# Patient Record
Sex: Female | Born: 1945 | Race: White | Hispanic: No | Smoking: Current every day smoker
Health system: Southern US, Community
[De-identification: ages and names within clinical notes are randomized; demographics above are authoritative.]

## PROBLEM LIST (undated history)

## (undated) DIAGNOSIS — Z8774 Personal history of (corrected) congenital malformations of heart and circulatory system: Secondary | ICD-10-CM

## (undated) DIAGNOSIS — I2510 Atherosclerotic heart disease of native coronary artery without angina pectoris: Secondary | ICD-10-CM

## (undated) DIAGNOSIS — N183 Chronic kidney disease, stage 3 unspecified: Secondary | ICD-10-CM

## (undated) DIAGNOSIS — K449 Diaphragmatic hernia without obstruction or gangrene: Secondary | ICD-10-CM

## (undated) DIAGNOSIS — Z720 Tobacco use: Secondary | ICD-10-CM

## (undated) DIAGNOSIS — I214 Non-ST elevation (NSTEMI) myocardial infarction: Secondary | ICD-10-CM

## (undated) DIAGNOSIS — I480 Paroxysmal atrial fibrillation: Secondary | ICD-10-CM

## (undated) DIAGNOSIS — Z8679 Personal history of other diseases of the circulatory system: Secondary | ICD-10-CM

## (undated) DIAGNOSIS — I255 Ischemic cardiomyopathy: Secondary | ICD-10-CM

## (undated) DIAGNOSIS — E875 Hyperkalemia: Secondary | ICD-10-CM

## (undated) DIAGNOSIS — I10 Essential (primary) hypertension: Secondary | ICD-10-CM

## (undated) DIAGNOSIS — K922 Gastrointestinal hemorrhage, unspecified: Secondary | ICD-10-CM

## (undated) DIAGNOSIS — N261 Atrophy of kidney (terminal): Secondary | ICD-10-CM

## (undated) DIAGNOSIS — K219 Gastro-esophageal reflux disease without esophagitis: Secondary | ICD-10-CM

## (undated) DIAGNOSIS — K2270 Barrett's esophagus without dysplasia: Secondary | ICD-10-CM

## (undated) DIAGNOSIS — K5790 Diverticulosis of intestine, part unspecified, without perforation or abscess without bleeding: Secondary | ICD-10-CM

## (undated) DIAGNOSIS — F70 Mild intellectual disabilities: Secondary | ICD-10-CM

## (undated) DIAGNOSIS — D649 Anemia, unspecified: Secondary | ICD-10-CM

## (undated) DIAGNOSIS — J9610 Chronic respiratory failure, unspecified whether with hypoxia or hypercapnia: Secondary | ICD-10-CM

## (undated) DIAGNOSIS — M1990 Unspecified osteoarthritis, unspecified site: Secondary | ICD-10-CM

## (undated) DIAGNOSIS — I5022 Chronic systolic (congestive) heart failure: Secondary | ICD-10-CM

## (undated) DIAGNOSIS — J449 Chronic obstructive pulmonary disease, unspecified: Secondary | ICD-10-CM

## (undated) DIAGNOSIS — W5311XA Bitten by rat, initial encounter: Secondary | ICD-10-CM

## (undated) HISTORY — PX: ASD REPAIR: SHX258

## (undated) HISTORY — PX: TOTAL ABDOMINAL HYSTERECTOMY: SHX209

## (undated) HISTORY — DX: Non-ST elevation (NSTEMI) myocardial infarction: I21.4

## (undated) HISTORY — PX: TONSILLECTOMY: SUR1361

## (undated) HISTORY — PX: TOTAL HIP ARTHROPLASTY: SHX124

## (undated) SURGERY — Surgical Case
Anesthesia: *Unknown

---

## 2002-10-05 ENCOUNTER — Emergency Department (HOSPITAL_COMMUNITY): Admission: EM | Admit: 2002-10-05 | Discharge: 2002-10-05 | Payer: Self-pay | Admitting: Emergency Medicine

## 2002-10-05 ENCOUNTER — Encounter: Payer: Self-pay | Admitting: Emergency Medicine

## 2006-03-17 ENCOUNTER — Emergency Department (HOSPITAL_COMMUNITY): Admission: EM | Admit: 2006-03-17 | Discharge: 2006-03-17 | Payer: Self-pay | Admitting: Emergency Medicine

## 2006-03-21 ENCOUNTER — Ambulatory Visit (HOSPITAL_COMMUNITY): Admission: RE | Admit: 2006-03-21 | Discharge: 2006-03-21 | Payer: Self-pay | Admitting: Pulmonary Disease

## 2006-03-25 ENCOUNTER — Ambulatory Visit (HOSPITAL_COMMUNITY): Admission: RE | Admit: 2006-03-25 | Discharge: 2006-03-25 | Payer: Self-pay | Admitting: Pulmonary Disease

## 2006-04-08 ENCOUNTER — Ambulatory Visit (HOSPITAL_COMMUNITY): Admission: RE | Admit: 2006-04-08 | Discharge: 2006-04-08 | Payer: Self-pay | Admitting: Pulmonary Disease

## 2006-06-28 ENCOUNTER — Ambulatory Visit (HOSPITAL_COMMUNITY): Admission: RE | Admit: 2006-06-28 | Discharge: 2006-06-28 | Payer: Self-pay | Admitting: Pulmonary Disease

## 2006-08-29 ENCOUNTER — Ambulatory Visit: Payer: Self-pay | Admitting: Orthopedic Surgery

## 2006-09-02 ENCOUNTER — Ambulatory Visit (HOSPITAL_COMMUNITY): Admission: RE | Admit: 2006-09-02 | Discharge: 2006-09-02 | Payer: Self-pay | Admitting: *Deleted

## 2006-09-15 ENCOUNTER — Ambulatory Visit: Payer: Self-pay | Admitting: Orthopedic Surgery

## 2006-09-30 ENCOUNTER — Ambulatory Visit (HOSPITAL_COMMUNITY): Admission: RE | Admit: 2006-09-30 | Discharge: 2006-09-30 | Payer: Self-pay | Admitting: Urology

## 2006-10-27 ENCOUNTER — Ambulatory Visit (HOSPITAL_COMMUNITY): Admission: RE | Admit: 2006-10-27 | Discharge: 2006-10-27 | Payer: Self-pay | Admitting: Urology

## 2006-11-17 ENCOUNTER — Ambulatory Visit: Payer: Self-pay | Admitting: Orthopedic Surgery

## 2006-11-28 ENCOUNTER — Ambulatory Visit (HOSPITAL_COMMUNITY): Admission: RE | Admit: 2006-11-28 | Discharge: 2006-11-28 | Payer: Self-pay | Admitting: Pulmonary Disease

## 2006-12-05 ENCOUNTER — Ambulatory Visit: Payer: Self-pay | Admitting: Orthopedic Surgery

## 2006-12-13 ENCOUNTER — Ambulatory Visit: Payer: Self-pay | Admitting: Orthopedic Surgery

## 2006-12-13 ENCOUNTER — Inpatient Hospital Stay (HOSPITAL_COMMUNITY): Admission: RE | Admit: 2006-12-13 | Discharge: 2006-12-16 | Payer: Self-pay | Admitting: Orthopedic Surgery

## 2006-12-13 ENCOUNTER — Ambulatory Visit: Payer: Self-pay | Admitting: Cardiovascular Disease

## 2006-12-13 ENCOUNTER — Encounter: Payer: Self-pay | Admitting: Orthopedic Surgery

## 2007-01-24 ENCOUNTER — Ambulatory Visit: Payer: Self-pay | Admitting: Orthopedic Surgery

## 2007-01-24 DIAGNOSIS — Z96649 Presence of unspecified artificial hip joint: Secondary | ICD-10-CM | POA: Insufficient documentation

## 2007-03-06 ENCOUNTER — Ambulatory Visit: Payer: Self-pay | Admitting: Orthopedic Surgery

## 2007-03-06 ENCOUNTER — Encounter (INDEPENDENT_AMBULATORY_CARE_PROVIDER_SITE_OTHER): Payer: Self-pay | Admitting: Radiology

## 2007-03-06 DIAGNOSIS — M169 Osteoarthritis of hip, unspecified: Secondary | ICD-10-CM

## 2007-03-06 DIAGNOSIS — M1610 Unilateral primary osteoarthritis, unspecified hip: Secondary | ICD-10-CM | POA: Insufficient documentation

## 2007-06-08 ENCOUNTER — Ambulatory Visit: Payer: Self-pay | Admitting: Orthopedic Surgery

## 2007-11-15 ENCOUNTER — Ambulatory Visit: Payer: Self-pay | Admitting: Orthopedic Surgery

## 2007-11-15 ENCOUNTER — Ambulatory Visit (HOSPITAL_COMMUNITY): Admission: RE | Admit: 2007-11-15 | Discharge: 2007-11-15 | Payer: Self-pay | Admitting: Orthopedic Surgery

## 2007-11-15 DIAGNOSIS — M545 Low back pain: Secondary | ICD-10-CM

## 2008-12-02 ENCOUNTER — Ambulatory Visit: Payer: Self-pay | Admitting: Orthopedic Surgery

## 2009-07-28 ENCOUNTER — Ambulatory Visit: Payer: Self-pay | Admitting: Orthopedic Surgery

## 2009-07-28 DIAGNOSIS — IMO0002 Reserved for concepts with insufficient information to code with codable children: Secondary | ICD-10-CM | POA: Insufficient documentation

## 2009-07-28 DIAGNOSIS — M1710 Unilateral primary osteoarthritis, unspecified knee: Secondary | ICD-10-CM

## 2009-07-30 ENCOUNTER — Encounter: Payer: Self-pay | Admitting: Orthopedic Surgery

## 2009-08-02 ENCOUNTER — Encounter: Payer: Self-pay | Admitting: Orthopedic Surgery

## 2010-06-07 ENCOUNTER — Encounter: Payer: Self-pay | Admitting: Pulmonary Disease

## 2010-06-16 NOTE — Letter (Signed)
Summary: *Orthopedic Consult Note  [facility]  [address]  [place], [place] [address]   Phone: [phone]  Fax: [phone]    Re:    Alexis Dillon DOB:    [birth year]   Dear: Renae Fickle   Thank you for requesting that we see the above patient for consultation.  A copy of the detailed office note will be sent under separate cover, for your review.  Evaluation today is consistent with: osteoarthritis RIGHT knee.  I offered the patient injection she did walk one.  She probably needs knee replacement surgery but I advised her to stop smoking and get and a patch if she needs it   Thank you for this opportunity to look after your patient.  Sincerely,   Terrance Mass. MD.

## 2010-06-16 NOTE — Letter (Signed)
Summary: History form  History form   Imported By: Jacklynn Ganong 08/06/2009 11:52:31  _____________________________________________________________________  External Attachment:    Type:   Image     Comment:   External Document

## 2010-06-16 NOTE — Assessment & Plan Note (Signed)
Summary: NEW PROBLEM/RT KNEE PAIN/NEEDS XR/REF HAWKINS/MEDICARE,MEDICA...   Visit Type:  new problem Referring Provider:  Dr. Kari Baars  CC:  right knee pain.  History of Present Illness: 65 years female chief complaint RIGHT knee pain she complains of sharp dull throbbing stabbing burning severe pain which comes and goes but mainly she feels all the time that she is hurting.  She gets relief with Advil and heating pad her pain is worse with cold and then whether.  She has associated symptoms of locking catching swelling tingling.  Xrays today in our office.    Allergies: No Known Drug Allergies  Past History:  Past Surgical History: Last updated: 12/16/2006 neck surgery heart surgery hole in heart hysterectomy Total Hip Arthroplasty right  Past Medical History: High Blood Pressure Back Feet Knee shoulder  Review of Systems Constitutional:  Complains of fever, chills, and fatigue; denies weight loss and weight gain. Cardiovascular:  Complains of palpitations; denies chest pain, fainting, and murmurs. Respiratory:  Complains of short of breath, wheezing, couch, and tightness; denies pain on inspiration and snoring . Gastrointestinal:  Complains of heartburn and nausea; denies vomiting, diarrhea, constipation, and blood in your stools. Neurologic:  Complains of tingling, unsteady gait, and dizziness; denies numbness, tremors, and seizure. Musculoskeletal:  Complains of joint pain, swelling, instability, stiffness, and muscle pain; denies redness and heat. Psychiatric:  Complains of depression and anxiety; denies nervousness and hallucinations. Skin:  Complains of poor healing; denies changes in the skin, rash, itching, and redness. HEENT:  Complains of blurred or double vision, redness, and watering; denies eye pain; headache, ears ringing. Immunology:  Complains of seasonal allergies; denies sinus problems and allergic to bee stings. Hemoatologic:  Complains of easy  bleeding and brusing.  The review of systems is negative for Genitourinary and Endocrine.  Physical Exam  Additional Exam:  GEN: appearance was normal   CDV: normal pulses temperature and no edema  LYMPH nodes were normal   SKIN was normal   Neuro: normal sensation Psyche: AAO x 3 and mood was normal   MSK *Gait was abnormal she walks supported by a cane she has a slumped posture  Inspection RIGHT knee she is tender medial joint line and lateral joint line.  Her range of motion is 115 /120.   She has good motor strength no instability.  She has valgus deformity of the knee.      Impression & Recommendations:  Problem # 1:  DEGENERATIVE JOINT DISEASE, RIGHT KNEE (ICD-715.96) Assessment New  plain films were obtained 3 views of the RIGHT knee they were done in my office  Findings there is valgus alignment to the knee.  There is diffuse osteopenia.  There is joint space narrowing laterally with surrounding osteophytes  Impression valgus osteoarthritis RIGHT knee with osteopenia  The patient an injection she didn't want the injection.  She probably needs to have a knee replacement.  She has significant COPD disease.  She should stop smoking and then she can have knee replacement surgery.  She also has osteoarthritis RIGHT hip.  Orders: Consultation Level III (16109) Knee x-ray,  3 views (60454)  Patient Instructions: 1)  See DR. Hawkins: 2)  about Patches for smoking  3)  return after stops smoking

## 2010-09-29 NOTE — H&P (Signed)
NAMEBREENA, BEVACQUA NO.:  [id number]   MEDICAL RECORD NO.:  [id number]          PATIENT TYPE:  AMB   LOCATION:  DAY                           FACILITY:  [facility]   PHYSICIAN:  Vickki Hearing, M.D.DATE OF BIRTH:  1945/12/19   DATE OF ADMISSION:  DATE OF DISCHARGE:  LH                              HISTORY & PHYSICAL   CHIEF COMPLAINT:  Right hip pain.   HISTORY:  This is a 65 year old female who has advanced osteoarthritis  of the right hip.  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  She presented with right groin  pain, some leg pain.  X-rays show a protrusio acetabula. She was taking  Percocet at the time for the relief and her pain at that time was  severe. She has trouble with simple activities of daily living such as  getting dressed, walking, getting of a chair and presents for total hip  replacement.   REVIEW OF SYSTEMS:  She has hypertension, nonsurgical chronic pain,  lightheadedness when she takes narcotic pain medicines, cough, shortness  of breath, headache, weakness, unsteady gait, sinusitis, hoarseness,  seasonal allergies.  Denies chest pain, nausea, kidney disease, thyroid  disease, depression, eczema, lymph node disease. She has had a hole in  her heart repaired at [facility] years ago.  She has had a disk  operation on her neck, she has had a hysterectomy.   MEDICATIONS:  She takes Premarin, naproxen, Tarka, aspirin.   FAMILY HISTORY:  Heart, lung disease, diabetes, cancer of the colon, and  arthritis.   SOCIAL HISTORY:  She is not employed.  She smokes one and a half packs a  day for 50 years.  No alcohol.  Grades completed one through five.   PHYSICAL EXAMINATION:  VITAL SIGNS:  Weight is 92 pounds, pulse 72,  respiratory rate is 16.  GENERAL:  Thin, normal development, adequate nutrition, ectomorphic body  habitus, normal grooming, peripheral pulses are intact.  There is no  swelling.  Lymph nodes in the neck are normal.  NEUROLOGY:  Gait and station is antalgic.  She has deformity of the left  great toe with severe bunion which is fixed deformity.  INSPECTION:  She has a leg length discrepancy small on the right, 90  degrees of hip flexion, 10 degrees of abduction, 20 of adduction,  painful internal rotation, normal external rotation on the right.  Left  hip 120 degrees of flexion and full range of motion.  Both hips are  stable.  Upper and lower extremity strength is normal.  Upper  extremities show full range of motion, no contracture, subluxation,  atrophy or tremor.  SKIN:  X4 extremities normal.  Reflexes 2+.  Sensation normal.  She is  oriented x3.  Mood and affect are normal.  ABDOMEN:  Soft, nondistended, nontender.  HEART:  Rate and rhythm normal.   Radiographs show osteoarthritis of the hip severe.   PLAN:  Right total hip with a Striker total hip system.      Vickki Hearing, M.D.  Electronically Signed     SEH/MEDQ  D:  12/05/2006  T:  12/05/2006  Job:  [id number]   cc:   [facility]

## 2010-09-29 NOTE — H&P (Signed)
Alexis Dillon, Alexis Dillon NO.:  [id number]   MEDICAL RECORD NO.:  [id number]          PATIENT TYPE:  AMB   LOCATION:  DAY                           FACILITY:  [facility]   PHYSICIAN:  Dennie Maizes, M.D.   DATE OF BIRTH:  April 20, 1946   DATE OF ADMISSION:  10/27/2006  DATE OF DISCHARGE:  LH                              HISTORY & PHYSICAL   CHIEF COMPLAINT:  Right hydronephrosis, back pain and right hip pain.   HISTORY OF PRESENT ILLNESS:  This 65 year old female was referred to me  by Dr. Ollen Bowl.  She has been having severe right hip pain and back pain  for about one year.  She was seen by Dr. Romeo Apple and evaluated with MRI  of the lumbar spine.  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  Diffuse disc disease was also  noted.  The patient was referred to me for further evaluation of the  hydronephrosis and management.   The patient denied having any flank pain, fever, chills or voiding  difficulty.  She has urinary frequency times 4 to 5, nocturia times 3 to  4.  She has good urinary flow.  There is no history of urolithiasis,  hematuria or urinary tract infection.   PAST MEDICAL HISTORY:  Status post heart surgery 20 years ago.  The  patient states a hole in the heart was repaired at that time.  Status  post anterior cervical fusion.  Status post hysterectomy.  Status post  tonsillectomy, history of hypertension.   MEDICATIONS:  Tarka, aspirin, naproxen.  Aspirin and naproxen have been  discontinued for the surgery.   ALLERGIES:  There are no known drug allergies.   PHYSICAL EXAMINATION:  VITAL SIGNS:  Height is 5 feet 4 inches, weight  99 pounds.  HEAD, EYES, EARS, NOSE AND THROAT:  Normal.  NECK:  No masses.  LUNGS:  Clear to auscultation.  HEART:  Regular rate and rhythm, no murmurs.  ABDOMEN:  Soft, palpable, flat, no CVA tenderness.  Bladder nonpalpable.  PELVIC:  Examination is negative.   LABORATORY DATA:   The patient's renal function is normal.  BUN is 24,  creatinine 0.97.  Further evaluation was done with CT scan of the  abdomen and pelvis with and without contrast.  This revealed a moderate  hiatal hernia, left benign adrenal adenoma was noted.  Right-sided  hydronephrosis with prominent extrarenal pelvis.  This may be due to UPJ  obstruction or proximal right ureteral obstruction.  No definite stone  has been noted.  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   IMPRESSION:  Right hydronephrosis, cause unknown.   PLAN:  Cystoscopy, right retrograde pyelogram, possible balloon dilation  of ureteral stricture and stent placement done at [facility]  I have discussed with the patient regarding the diagnosis, operative  details, alternative treatment and also possible risks and  complications.  She has agreed for the procedure to be done.      Dennie Maizes, M.D.  Electronically Signed     SK/MEDQ  D:  10/26/2006  T:  10/26/2006  Job:  [id number]   cc:   [facility]  Fax: [phone]   Colon Flattery. Ollen Bowl, M.D.  Fax: [phone]   Vickki Hearing, M.D.  Fax: [phone]

## 2010-09-29 NOTE — Discharge Summary (Signed)
Alexis, Dillon NO.:  [id number]   MEDICAL RECORD NO.:  [id number]          PATIENT TYPE:  INP   LOCATION:  A314                          FACILITY:  [facility]   PHYSICIAN:  Vickki Hearing, M.D.DATE OF BIRTH:  [birth year]   DATE OF ADMISSION:  12/13/2006  DATE OF DISCHARGE:  08/01/2008LH                               DISCHARGE SUMMARY   PREOPERATIVE DIAGNOSIS:  Osteoarthritis, right hip.   DISCHARGE DIAGNOSIS:  Osteoarthritis, right hip.   OPERATIVE PROCEDURE:  Right total hip.   ADMITTING PHYSICIAN:  Dr. Romeo Apple.   CONSULTING PHYSICIANS:  1. Edward L. Juanetta Gosling, M.D., for medical.  2. Noralyn Pick. Eden Emms, MD, cardiology.   Operative procedure was done under spinal.   We used a Stryker press-fit stem, press-fit cup augmented with screws  and autograft for the acetabulum.   Operative findings:  Severe OA right hip.  Protrusio acetabuli and a  sclerotic acetabulum.  The patient had no complications.   HISTORY:  This is a 65 year old female with advanced osteoarthritis of  the right hip and protrusio acetabuli with right groin pain, right leg  pain, who was being managed with Percocet for pain control and  eventually that no longer managed her pain.  She could not dress, walk  well, and had trouble getting out of her chair.  She had a preop medical  evaluation because of chronic COPD.  She cleared that and was scheduled  for surgery.   HOSPITAL COURSE:  This patient was admitted on the 29th through same-day  surgery, had the surgery as stated, did well, postoperatively developed  what we thought was atrial fibrillation with slow ventricular response.  She had a stable presentation of that with a pulse of 43, no change in  vital signs, no chest pain.   Cardiology and medical consults were obtained and she had an  echocardiogram that basically showed an ejection fraction 60%, some mild  left ventricular hypertrophy, aortic valve sclerosis with trivial  aortic  insufficiency, trivial mitral regurgitation, small residual floor across  her atrial septum, normal right ventricular volume.   She did require transfusion for a hemoglobin of 9.6.  she got 2 units  and hemoglobin on the first was 10.7.  She was on Lovenox for DVT  prevention.   She was able to ambulate very well.  Her pain was reasonably controlled  with a PCA, a pain pump catheter, and oral analgesics which included  Vicodin.   The plan is to discharge her to rehab.  She has a 99.3 temperature,  blood pressure 115/68.  Room air saturation is 94%, pulse of 90.   According to the notes from her medical doctor, she actually had sinus  bradycardia with PACs and no atrial fibrillation.   DISCHARGE MEDICATIONS:  1. Enoxaparin 30 mg subcu q.24h. for 21 days.  2. Iron 325 mg p.o. t.i.d.  3. Vicodin one tablet q.4h. p.r.n. for pain.  4. Protonix 40 mg  p.o. q. __________.  5. Atrovent 0.5 mg inhaler q.i.d.  6. Senokot one tablet b.i.d. a.c.  7. Mavik 4 mg daily.  8. Robaxin 500 mg q.6h. p.r.n. spasm or pain in the leg.  9. Restoril 15 mg p.o. q.h.s. p.r.n. insomnia.   Follow-up has already been scheduled or call [phone] for details.  Staples should come out in 11 days.  Change dressing daily as required.   The patient is weightbearing as tolerated.   The patient should have anterior hip precautions with no external  rotation or extension of the limb.  Abduction pillow for 6 weeks.      Vickki Hearing, M.D.  Electronically Signed     SEH/MEDQ  D:  12/16/2006  T:  12/16/2006  Job:  [id number]   cc:   Third [facility], [facility]

## 2010-09-29 NOTE — Op Note (Signed)
Alexis Dillon, VANSCHAICK NO.:  [id number]   MEDICAL RECORD NO.:  [id number]          PATIENT TYPE:  INP   LOCATION:  A314                          FACILITY:  [facility]   PHYSICIAN:  Vickki Hearing, M.D.DATE OF BIRTH:  [birth year]   DATE OF PROCEDURE:  12/13/2006  DATE OF DISCHARGE:                               OPERATIVE REPORT   HISTORY:  A 65 year old female with advanced osteoarthritis of the right  hip with protrusio acetabula, presented with right groin pain and some  right leg pain.  Neurosurgical consult revealed no compressive  neuropathy.  The patient was on Percocet for pain.  She could not get  dressed, could not walk well, had trouble getting out of a chair and  after discussion of several options, preop medical clearance for her  chronic pulmonary disease, she presented for right total hip.   PREOPERATIVE DIAGNOSIS:  Osteoarthritis of right hip.   POSTOPERATIVE DIAGNOSIS:  Osteoarthritis of right hip.   PROCEDURE:  Right total hip arthroplasty with femoral head autograft for  the acetabulum with the addition DBM and putty to stimulate bone growth.  We used 5 mL of Grafton putty and 5 mL of the DBX putty.   SURGEON:  Vickki Hearing, M.D.   ASSISTANTS:  [place] and [place] Founds.   ANESTHETIC:  Spinal.   SPECIMENS:  Femoral head specimen to Pathology.   BLOOD LOSS:  Estimated at 400.   COMPLICATIONS:  None.   COUNTS:  Correct.   DISPOSITION:  The patient went to PACU in good condition.   IMPLANTS:  Were used a Trident PSL HA-coated cluster acetabular shell,  size 52 Stryker system.  We used 3 cancellus bone screws in the cup.  We  used a 32 Trident X3, 0-degree polyethylene insert.  We used an Accolade  #2, 127-degree Press-Fit stem and we used a +8 thirty-two-millimeter  metal Vitallium alloy head.   DESCRIPTION OF PROCEDURE:  The patient was identified as Marthe Patch.  She had her right hip marked for surgery, countersigned  by the surgeon,  history and physical updated.  The patient went to surgery, had a spinal  anesthetic; the first one did not take; a second one was done and it  took find.  Catheter was inserted into the bladder.  The patient was  placed on the left side with an axillary roll and padding on the left  leg and hip.  Sterile prep was done with DuraPrep and draped sterilely,  time-out procedure completed, procedure confirmed, antibiotics within an  hour of skin incision confirmed.   A slightly curved incision was made starting at the proximal femur,  curving across the trochanter and curving proximally, subcu tissue  divided down to fascia, fascia split in line with the skin incision,  greater trochanteric bursa resected, anterior half of abductors removed  from the greater trochanter with subperiosteal dissection in a  continuous sleeve with the vastus lateralis musculature; this was  retracted anteriorly.  The capsule, which was redundant, hypertrophic  and extremely vascular, was excised.  The lesser trochanter was  identified.  The hip was dislocated and the femoral head was resected.  An Accolade neck-cutting guide was used to perform the neck cut.  We  used a curette to enter the canal, a starter reamer followed by a box  cutter and broached up to a size 2 and gave an excellent fit.  We  extended the leg, cleaned the acetabulum, removed osteophytes from the  edges, placed retractors and then reamed starting with a 44 up to a 52.  The inner aspects of the acetabulum were extremely sclerotic; in fact,  there was no cancellus bone which at all.  We got down close to the  inner table and to avoid fracturing or breaching it, I decided to bone-  graft it with femoral head and DBX and putty and Grafton putty.  So we  reamed up to a 52.  We aimed for an anteversion of 20 and an abduction  angle of 45.  We put a trial in with a trial 32 liner, the 2 stem, did a  trial reduction with +4 and +8;  the +8 gave the best fit in terms of  stability and range of motion.   We then took an x-ray with the Trident cup in place with 3 screws, the  polyethylene liner and the #2 stem with a trial 8-mm-long 32 head.  The  x-rays showed that the cup was in good position, screws were in good  position, stem was in good position, length was good, so we took the 8-  mm 32 head off, put the real on, re-reduced the hip, took it through a  range of motion again and I found it to be in excellent range of motion  and stability.   We irrigated.  We closed the abductors through drill holes in running  and interrupted suture including the vastus lateralis sleeve.  We closed  the fascia with interrupted and running suture as well.  We closed the  subcu with 0 Monocryl suture over a pain pump catheter.  We injected  deep into the hip joint 30 mL of Marcaine with epinephrine.   Staples were used to reapproximate the skin.   The postop plan will be slightly altered.  We will do 50% weightbearing  secondary to the thin inner table and the graft that was placed.  We  will do this for approximately a month and then allow progressive  weightbearing to tolerance.      Vickki Hearing, M.D.  Electronically Signed     SEH/MEDQ  D:  12/13/2006  T:  12/14/2006  Job:  [id number]

## 2010-09-29 NOTE — Consult Note (Signed)
NAMEANNIS, LAGOY NO.:  [id number]   MEDICAL RECORD NO.:  [id number]          PATIENT TYPE:  INP   LOCATION:  A314                          FACILITY:  [facility]   PHYSICIAN:  Edward L. Juanetta Gosling, M.D.DATE OF BIRTH:  February 16, 1946   DATE OF CONSULTATION:  12/13/2006  DATE OF DISCHARGE:                                 CONSULTATION   HISTORY OF PRESENT ILLNESS:  Ms. Baksh is a 65 year old who has severe  osteoarthritis of her right hip and underwent a hip replacement today by  Dr. Romeo Apple.  She has a long known history of COPD, and she continues  to smoke about a pack and half of cigarettes daily.  She is called  backward by her family, and I suspect that she is mildly mentally  retarded.  Her family does provide most of her care, although she lives  independently.   PAST MEDICAL HISTORY:  1. COPD.  2. Hypertension.  3. Chronic pain in her back.  4. She says she has a whole in her heart that was repaired some 20      years ago.  I do not know what the details of all that are.  5. Right hydronephrosis in June of this year.  6. Hysterectomy.  7. Tonsillectomy.   MEDICATIONS:  Aspirin, Naprosyn and Premarin.  She also uses a nebulizer  at home.   ALLERGIES:  She does not have any known drug allergies.   FAMILY HISTORY:  Very positive for heart disease.   SOCIAL HISTORY:  She smokes about a pack of cigarettes daily and has for  50+ years.  Her educational level is fifth grade.  She is not married.   REVIEW OF SYSTEMS:  Except as mentioned, is negative.   PHYSICAL EXAMINATION:  VITAL SIGNS:  Temperature is 96.8, pulse is 59  and regular, respirations 18, blood pressure 93/51, O2 sats 100% on 3  liters.  HEENT:  Her pupils are reactive.  She does have some lateral strabismus.  Her mucous membranes are moist.  NECK:  Supple without masses.  CHEST:  Clear without wheezes.  She does have some rhonchi.  HEART:  Regular without gallop.  ABDOMEN:  Soft.  EXTREMITIES:  Per Dr. Romeo Apple.   ASSESSMENT:  She has pretty severe COPD.  She is on nebulizer  treatments.  She is going to have incentive spirometry.  I will plan to  follow with you.  She will restarted on her antihypertensives as well.      Edward L. Juanetta Gosling, M.D.  Electronically Signed     ELH/MEDQ  D:  12/14/2006  T:  12/14/2006  Job:  [id number]

## 2010-09-29 NOTE — Group Therapy Note (Signed)
NAMEMYLEAH, CAVENDISH NO.:  [id number]   MEDICAL RECORD NO.:  [id number]          PATIENT TYPE:  INP   LOCATION:  A314                          FACILITY:  [facility]   PHYSICIAN:  Edward L. Juanetta Gosling, M.D.DATE OF BIRTH:  [birth year]   DATE OF PROCEDURE:  12/15/2006  DATE OF DISCHARGE:                                 PROGRESS NOTE   Ms. Duby seems to be doing quite well.  Her EKG was misinterpreted and  actually shows sinus bradycardia with PACs not slow atrial fibrillation,  which of course is good news.  She feels well, has no complaints.  Her  heart rate is actually in the 90s now.  Temperature is 98.2, pulse 91,  respirations 18, blood pressure 135/70, O2 sat 98% on 2 liters.  Her  chest is clear with decreased breath sounds, some rhonchi.  Her white  count 10,400, hemoglobin is 12.2.  Her electrolytes are normal.  She did  rule out for myocardial infarction.   ASSESSMENT:  She looks much better.   PLAN:  She is attempting to get up and move around some today.  Will  continue with everything else and follow.      Edward L. Juanetta Gosling, M.D.  Electronically Signed     ELH/MEDQ  D:  12/15/2006  T:  12/15/2006  Job:  [id number]

## 2010-09-29 NOTE — Procedures (Signed)
NAMEZAYLEIGH, STROH NO.:  [id number]   MEDICAL RECORD NO.:  [id number]          PATIENT TYPE:  OUT   LOCATION:  RAD                           FACILITY:  [facility]   PHYSICIAN:  Edward L. Juanetta Gosling, M.D.DATE OF BIRTH:  12/22/1945   DATE OF PROCEDURE:  DATE OF DISCHARGE:  11/28/2006                            PULMONARY FUNCTION TEST   PULMONARY FUNCTION TEST RESULTS:  1. Spirometry shows a moderate to severe ventilatory defect with      evidence of airflow obstruction.  2. Lung volumes are normal.  3. DLCO is moderately reduced.  4. There is no significant bronchodilator effect and in fact pulmonary      function worsened after inhaled bronchodilator.      Edward L. Juanetta Gosling, M.D.  Electronically Signed     ELH/MEDQ  D:  11/30/2006  T:  12/01/2006  Job:  [id number]   cc:   Vickki Hearing, M.D.  Fax: [phone]

## 2010-09-29 NOTE — Op Note (Signed)
Alexis Dillon, HOLLENBERG NO.:  [id number]   MEDICAL RECORD NO.:  [id number]          PATIENT TYPE:  AMB   LOCATION:  DAY                           FACILITY:  [facility]   PHYSICIAN:  Dennie Maizes, M.D.   DATE OF BIRTH:  June 19, 1945   DATE OF PROCEDURE:  10/27/2006  DATE OF DISCHARGE:                                PROCEDURE NOTE   PREOPERATIVE DIAGNOSIS:  Right hydronephrosis.   PREOPERATIVE DIAGNOSES:  1. Right hydronephrosis.  2. Urethral stenosis.   OPERATIVE PROCEDURE:  1. Cystoscopy.  2. Urethral dilation.  3. Right retrograde pyelogram.   ANESTHESIA:  Spinal.   SURGEON:  Dennie Maizes, M.D.   COMPLICATIONS:  None.   INDICATIONS FOR THE PROCEDURE:  This 65 year old female was evaluated  for back pain and right hip pain.  X-rays revealed a severe degree of  right hydronephrosis.  There was no evidence of any calculus.  The site  of obstruction was unknown.  The patient was taken to the operating room  today for cystoscopy and retrograde pyelogram with possible balloon  dilation and stent placement.   DESCRIPTION OF PROCEDURE:  Spinal anesthesia was induced and the patient  was placed on the OR table in the dorsal lithotomy position.  The lower  abdomen and genitalia were prepped and draped in a sterile fashion.  Urethral stenosis was noted.  Urethra was dilated up to 28-French with  straight metal sounds.  Cystoscopy was done with the 25-French scope.  The trigone and ureteral orifices were normal.  Bladder was found to be  trabeculated.   A 5-French Burch catheter was then placed the right ureteral orifice and  retrograde pyelogram was done by using about 7 mL of  Renografin 60.  The distal ureter up to the level of pelvic brim was  normal.  The proximal ureter was found to be dilated and tortuous.  The  entire collecting system could not be visualized.  A 5-French open-ended  catheter was then placed in the right distal ureter and contrast was  injected.  It opacified a dilated collecting system.  I reviewed the x-  rays with Dr. Tyron Russell, who was present in the operating room during the  procedure.  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  The dilated renal pelvis  reached up to the level of the pelvic brim.  Through the open-ended  catheter I tried to pass a Glidewire.  The Glidewire could be passed  into the upper part of the renal pelvis.  I was unable to insert an open-  ended catheter over the Glidewire due to the tortuosity of the ureter.  It was very difficult to fill the entire collecting system.  The point  of obstruction of the collecting system could not be confirmed by the x-  rays.  As the patient was asymptomatic with good renal function, I  decided not to place a stent.  I consulted Dr. Tyron Russell during this  procedure.  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  The exact site of obstruction could not be confirmed on  the x-rays.   I plan to follow the patient closely.  If she develops flank pain,  fever, chills or deterioration of renal function, she will need drainage  of the right kidney.      Dennie Maizes, M.D.  Electronically Signed     SK/MEDQ  D:  10/27/2006  T:  10/27/2006  Job:  [id number]   cc:   Ramon Dredge L. Juanetta Gosling, M.D.  Fax: [phone]   Vickki Hearing, M.D.  Fax: [phone]

## 2010-09-29 NOTE — Consult Note (Signed)
NAMEYOBANA, CULLITON NO.:  [id number]   MEDICAL RECORD NO.:  [id number]          PATIENT TYPE:  INP   LOCATION:  A314                          FACILITY:  [facility]   PHYSICIAN:  Noralyn Pick. Eden Emms, MD, FACCDATE OF BIRTH:  [birth year]   DATE OF CONSULTATION:  12/14/2006  DATE OF DISCHARGE:                                 CONSULTATION   CARDIOLOGY CONSULTATION   REASON FOR CONSULTATION:  Mrs. Nancarrow is a pleasant 65 year old patient  status post right hip surgery who we are asked to see for atrial  fibrillation and positive enzymes.   HISTORY OF PRESENT ILLNESS:  The patient has a history of probable ASD  closure 20 years ago at [facility].  She said she had a hole in her  heart; records are not available.  She has not had a cardiology followup  since then.  She has no known coronary artery disease.   The patient's coronary risk factors include hypertension and smoking.   The patient continued to smoke right up until the time of her hip  surgery.   She has had no previous history of coronary artery disease, MI or  arrhythmia's.   The patient had been doing fine post hip.  She is relatively  asymptomatic.  An EKG was performed on December 13, 2006 in the morning and  was read by the computer as atrial fibrillation.  However, I reviewed  the EKG and it is clearly sinus rhythm with some junctional escapes.  P  waves are evident.   There are insignificant Q waves in II, III and F.   In talking to the patient she has some chronic shortness of breath and  clinically she would appear to have COPD from her smoking.   She has not had any significant chest pain or palpitations.   Her postoperative course is primarily remarkable for pain in her right  hip.   The patient has had previous back problems.   Her pain had reached a crescendo pattern necessitating right hip  surgery.   She had previously been seen by Dr. Garnette Scheuermann in [place].  She does  not remember the  name of her surgeon.  She has not had clinical followup  here in [place].   REVIEW OF SYSTEMS:  The patient's current review of systems is negative.  Particularly, she has not had any significant syncope.   PAST MEDICAL HISTORY:  The patient's past medical history includes:  1. Hypertension.  2. Nicotine abuse.  3. Previous ASD repair.  4. She has had chronic back problems with unsteady gait.  5. History of sinusitis and seasonal allergies.   MEDICATIONS:  Prior to admission she as on Tarka, aspirin and Premarin.  Here in the hospital she has also been on Verapamil.   FAMILY HISTORY:  Remarkable for diabetes and colon cancer.  There is no  previous history of congenital heart disease in the family.   SOCIAL HISTORY:  The patient is divorced and widowed.  She has no kids.  She lives near her sister.  She is unemployed.  She smokes  1-1/2 packs  per day for over 50 years.  There is no alcohol.  She does not have a  high school education.   ALLERGIES:  She denies any allergies, although she said there is one 500  mg pill that was given to her that made her feel sick.   PHYSICAL EXAMINATION:  GENERAL APPEARANCE:  Her exam is currently  remarkable for a frail, thin, middle aged white female who looks older  than her stated age.  Affect is appropriate.  VITAL SIGNS:  Her pulse is currently 62 and regular.  Her blood pressure  is 120/70.  She is afebrile.  Her temperature is 97.8.  respiratory rate  is 14.  HEENT: Normal.  NECK:  Carotids are normal without bruits.  There is no lymphadenopathy,  no thyromegaly, no JVP elevation.  LUNGS:  Have decreased diaphragmatic motion.  They are clear.  There is  no active wheezing.  HEART:  There is an S1, S2 with previous surgical scar from probable ASD  repair.  There are no murmurs, rubs, gallops or clicks.  The heart  sounds are benign.  ABDOMEN:  Benign.  There is no AAA.  Bowel sounds are positive.  There  is no hepatosplenomegaly, no  hepatojugular reflux.  MUSCULOSKELETAL:  She is status post right hip surgery with a drain and  an immobilizer.  EXTREMITIES:  Her distal pulses are intact with no edema.   CLINICAL DATA:  Her chest x-ray from November 28, 2006 showed chronic  obstructive pulmonary disease but no evidence of cardiomegaly.   LABORATORY DATA:  Her current laboratory work is remarkable for a  potassium of 4.4, BUN 9, creatinine 0.8.  Her CBC is remarkable for a  hematocrit of 28.5, white count 5.0.  Cardiac markers showed a  creatinine kinase of 1091 but a relative index of only 2.7 and a  troponin of 0.04.   IMPRESSION:  1. Rhythm:  Clearly not atrial fibrillation.  Continue DVT lovenox      therapy no need for long term coumadin.  2. Bradycardia:  Probably some sinus node dysfunction in the setting      of previous ASD repair.  Hold verapamil  3. Hypotension:  Asymptomatic in post of state with relative anemia.      Hydrate and follow hold verapamil  4. History of ASD repair Second heart sound not split no evidence of      RV volume overload. Check Echo to assess repair and RV/LV function  5. Enzymes:  Doubt acute coronary syndrome with negative troponin, no      acute ECG changes and no chest pain.  % MB is quite low.  Follow      serial ECG's and markers.   We will be happy to follow her along here in the hospital.      Theron Arista C. Eden Emms, MD, [facility]  Electronically Signed     PCN/MEDQ  D:  12/14/2006  T:  12/14/2006  Job:  [id number]

## 2010-09-29 NOTE — Group Therapy Note (Signed)
NAMESYMIA, HERDT NO.:  [id number]   MEDICAL RECORD NO.:  [id number]          PATIENT TYPE:  INP   LOCATION:  A314                          FACILITY:  [facility]   PHYSICIAN:  Edward L. Juanetta Gosling, M.D.DATE OF BIRTH:  1946-01-29   DATE OF PROCEDURE:  DATE OF DISCHARGE:                                 PROGRESS NOTE   Problem is COPD, hip replacement, hypertension, sinus bradycardia.   SUBJECTIVE:  Alexis Dillon seems to be doing much better today.  She has no  new complaints, except she wants something else to eat.  She denies any  nausea, vomiting or diarrhea.  She denies any other new problems.   Her physical examination today shows her chest is much clearer.  Her  blood pressure in the 130s, pulse about 90.  Her chest is much clearer.  Her heart is regular.  Her abdomen is soft.   ASSESSMENT:  She is being set up for transfer to a rehab facility, and I  am waiting for that to be accomplished.  No new treatments today.      Edward L. Juanetta Gosling, M.D.  Electronically Signed     ELH/MEDQ  D:  12/16/2006  T:  12/16/2006  Job:  [id number]

## 2010-09-29 NOTE — Group Therapy Note (Signed)
NAMETRYPHENA, Dillon NO.:  [id number]   MEDICAL RECORD NO.:  [id number]          PATIENT TYPE:  INP   LOCATION:  A314                          FACILITY:  [facility]   PHYSICIAN:  Edward L. Juanetta Gosling, M.D.DATE OF BIRTH:  1946-03-11   DATE OF PROCEDURE:  12/14/2006  DATE OF DISCHARGE:                                 PROGRESS NOTE   Alexis Dillon has developed atrial fibrillation with slow ventricular  response overnight.  She is asymptomatic.  She did have one set of  cardiac enzymes done, which showed no evidence of progress, and I will  go ahead and check some more.  She denies any chest pain.  She is not  short of breath.  She is sitting up and eating.   EXAM:  Temperature is 97.8, pulse 62 but irregular, respirations 20,  blood pressure 92/57.   ASSESSMENT:  She has atrial fibrillation, new onset.  She is on Tarka,  which is probably why it is slow ventricular response.   PLAN:  To and get an echocardiogram, get a cardiology consultation,  continue with everything else, and follow.      Edward L. Juanetta Gosling, M.D.  Electronically Signed     ELH/MEDQ  D:  12/14/2006  T:  12/14/2006  Job:  [id number]

## 2010-09-29 NOTE — Procedures (Signed)
Alexis Dillon, Alexis Dillon NO.:  [id number]   MEDICAL RECORD NO.:  [id number]          PATIENT TYPE:  INP   LOCATION:  A314                          FACILITY:  [facility]   PHYSICIAN:  Noralyn Pick. Eden Emms, MD, FACCDATE OF BIRTH:  1946/04/14   DATE OF PROCEDURE:  DATE OF DISCHARGE:                                ECHOCARDIOGRAM   INDICATIONS:  Previous ASD repair, question a-fib.   The ventricular cavity size was normal.  There was mild LVH.  Ejection  fraction was 60%.  There were no discrete regional wall motion  abnormalities.  Left atrium and right-sided cardiac chambers were  normal.  There was no evidence of pulmonary hypertension or volume  overload on the right side.  Mitral valve was mildly thickened with  trivial MR.  Aortic valve was trileaflet and sclerotic.  There was  trivial aortic insufficiency.  Aortic root was normal.  Subcostal  imaging revealed no pericardial effusion.  No source of embolus.   There did appear to be a very small residual PFO or defect across the  atrial septum.  This was quite small and did not obviously lead to any  significant volume or pressure overload on the right side.   M-mode measurements showed an aortic dimension of 35 mm, left atrial  dimension 34 mm, septal thickness 11 mm, LV diastolic dimension 40 mm,  LV systolic dimension 28 mm.   IMPRESSION:  1. Mild left ventricular hypertrophy, ejection fraction 60%.  2. Aortic valve sclerosis with trivial aortic insufficiency.  3. Trivial mitral regurgitation.  4. Normal right ventricle with no evidence of volume or pressure      overload.  Mild tricuspid regurgitation.  5. No pericardial effusion.  6. Very small residual flow across the atrial septum either indicating      patent foramen ovale versus small defect in the atrial septal      repair.   This would clinically not appear to be significant.      Noralyn Pick. Eden Emms, MD, [facility]  Electronically Signed     PCN/MEDQ  D:   12/14/2006  T:  12/15/2006  Job:  [id number]

## 2011-03-01 LAB — CBC
HCT: 31.1 — ABNORMAL LOW
HCT: 28.5 — ABNORMAL LOW
HCT: 40.8
Hemoglobin: 12.2
Hemoglobin: 14
Hemoglobin: 9.6 — ABNORMAL LOW
MCHC: 34.3
MCHC: 33.4
MCHC: 33.7
MCHC: 33.8
MCHC: 34.3
MCV: 89.7
MCV: 89.6
MCV: 92.7
MCV: 93.6
Platelets: 327
RBC: 3.47 — ABNORMAL LOW
RBC: 3.05 — ABNORMAL LOW
RBC: 4.03
RBC: 4.4
RDW: 13.2
RDW: 13.4
RDW: 16 — ABNORMAL HIGH
WBC: 5
WBC: 9.1

## 2011-03-01 LAB — CROSSMATCH: Antibody Screen: NEGATIVE

## 2011-03-01 LAB — DIFFERENTIAL
Basophils Absolute: 0.1
Basophils Absolute: 0.1
Basophils Absolute: 0.1
Basophils Relative: 1
Basophils Relative: 0
Basophils Relative: 1
Basophils Relative: 1
Eosinophils Absolute: 0.1
Eosinophils Absolute: 0
Eosinophils Absolute: 0.1
Eosinophils Relative: 1
Eosinophils Relative: 2
Lymphocytes Relative: 11 — ABNORMAL LOW
Lymphs Abs: 1.2
Lymphs Abs: 0.9
Monocytes Absolute: 0.9 — ABNORMAL HIGH
Monocytes Absolute: 0.6
Monocytes Absolute: 0.6
Monocytes Absolute: 1.1 — ABNORMAL HIGH
Monocytes Relative: 8
Monocytes Relative: 11
Monocytes Relative: 12 — ABNORMAL HIGH
Neutro Abs: 8.7 — ABNORMAL HIGH
Neutro Abs: 5.2
Neutrophils Relative %: 79 — ABNORMAL HIGH
Neutrophils Relative %: 68
Neutrophils Relative %: 76

## 2011-03-01 LAB — BASIC METABOLIC PANEL
BUN: 5 — ABNORMAL LOW
BUN: 9
CO2: 22
CO2: 25
CO2: 26
CO2: 27
Calcium: 8.2 — ABNORMAL LOW
Calcium: 8.2 — ABNORMAL LOW
Calcium: 8.6
Chloride: 105
Chloride: 107
Chloride: 109
Creatinine, Ser: 0.79
Creatinine, Ser: 0.79
Creatinine, Ser: 0.84
GFR calc Af Amer: >60
GFR calc Af Amer: >60
GFR calc Af Amer: >60
GFR calc non Af Amer: >60
GFR calc non Af Amer: >60
Glucose, Bld: 110 — ABNORMAL HIGH
Glucose, Bld: 106 — ABNORMAL HIGH
Potassium: 3.7
Potassium: 4.4
Sodium: 133 — ABNORMAL LOW
Sodium: 136
Sodium: 136

## 2011-03-01 LAB — CARDIAC PANEL(CRET KIN+CKTOT+MB+TROPI)
CK, MB: 13.8 — ABNORMAL HIGH
Relative Index: 2.7 — ABNORMAL HIGH
Total CK: 1091 — ABNORMAL HIGH
Total CK: 1156 — ABNORMAL HIGH
Total CK: 635 — ABNORMAL HIGH
Total CK: 880 — ABNORMAL HIGH
Troponin I: 0.02
Troponin I: 0.03
Troponin I: 0.04

## 2011-03-01 LAB — COMPREHENSIVE METABOLIC PANEL
Alkaline Phosphatase: 98
CO2: 26
Chloride: 104
GFR calc Af Amer: >60
Glucose, Bld: 100 — ABNORMAL HIGH
Potassium: 4.1
Sodium: 135
Total Bilirubin: 0.5

## 2011-03-01 LAB — ABO/RH: ABO/RH(D): O POS

## 2011-03-01 LAB — PROTIME-INR
INR: 0.9
Prothrombin Time: 12.6

## 2011-03-01 LAB — APTT: aPTT: 33

## 2011-03-04 LAB — BASIC METABOLIC PANEL
BUN: 7
Calcium: 9.4
Creatinine, Ser: 0.86
GFR calc non Af Amer: >60
Glucose, Bld: 83

## 2011-03-04 LAB — POCT I-STAT 4, (NA,K, GLUC, HGB,HCT)
Operator id: 214531
Potassium: 4.2
Sodium: 132 — ABNORMAL LOW

## 2011-03-04 LAB — CBC
Platelets: 267
RDW: 13.5
WBC: 9.6

## 2012-03-06 ENCOUNTER — Other Ambulatory Visit (HOSPITAL_COMMUNITY): Payer: Self-pay | Admitting: Pulmonary Disease

## 2012-03-06 DIAGNOSIS — Z139 Encounter for screening, unspecified: Secondary | ICD-10-CM

## 2012-03-14 ENCOUNTER — Ambulatory Visit (HOSPITAL_COMMUNITY)
Admission: RE | Admit: 2012-03-14 | Discharge: 2012-03-14 | Disposition: A | Discharge status: Home / Self Care | Payer: Medicare Other | Source: Ambulatory Visit | Attending: Pulmonary Disease | Admitting: Pulmonary Disease

## 2012-03-14 DIAGNOSIS — Z139 Encounter for screening, unspecified: Secondary | ICD-10-CM

## 2012-03-14 DIAGNOSIS — Z1231 Encounter for screening mammogram for malignant neoplasm of breast: Secondary | ICD-10-CM | POA: Insufficient documentation

## 2013-03-27 ENCOUNTER — Other Ambulatory Visit (HOSPITAL_COMMUNITY): Payer: Self-pay | Admitting: Pulmonary Disease

## 2013-03-27 DIAGNOSIS — Z139 Encounter for screening, unspecified: Secondary | ICD-10-CM

## 2013-04-02 ENCOUNTER — Ambulatory Visit (HOSPITAL_COMMUNITY): Payer: Medicare Other

## 2013-04-16 DIAGNOSIS — I2510 Atherosclerotic heart disease of native coronary artery without angina pectoris: Secondary | ICD-10-CM | POA: Insufficient documentation

## 2013-04-16 DIAGNOSIS — I480 Paroxysmal atrial fibrillation: Secondary | ICD-10-CM

## 2013-04-16 HISTORY — PX: CARDIAC CATHETERIZATION: SHX172

## 2013-04-16 HISTORY — DX: Atherosclerotic heart disease of native coronary artery without angina pectoris: I25.10

## 2013-04-16 HISTORY — DX: Paroxysmal atrial fibrillation: I48.0

## 2013-04-24 ENCOUNTER — Ambulatory Visit (HOSPITAL_COMMUNITY): Payer: Medicare Other

## 2013-04-30 ENCOUNTER — Ambulatory Visit (HOSPITAL_COMMUNITY)
Admission: RE | Admit: 2013-04-30 | Discharge: 2013-04-30 | Disposition: A | Discharge status: Home / Self Care | Payer: Medicare Other | Source: Ambulatory Visit | Attending: Pulmonary Disease | Admitting: Pulmonary Disease

## 2013-04-30 DIAGNOSIS — Z1231 Encounter for screening mammogram for malignant neoplasm of breast: Secondary | ICD-10-CM | POA: Insufficient documentation

## 2013-04-30 DIAGNOSIS — Z139 Encounter for screening, unspecified: Secondary | ICD-10-CM

## 2013-05-02 ENCOUNTER — Emergency Department (HOSPITAL_COMMUNITY): Payer: Medicare Other

## 2013-05-02 ENCOUNTER — Encounter (HOSPITAL_COMMUNITY): Payer: Self-pay | Admitting: Emergency Medicine

## 2013-05-02 ENCOUNTER — Other Ambulatory Visit: Payer: Self-pay

## 2013-05-02 ENCOUNTER — Inpatient Hospital Stay (HOSPITAL_COMMUNITY)
Admission: EM | Admit: 2013-05-02 | Discharge: 2013-05-11 | DRG: 280 | Disposition: A | Discharge status: Skilled Nursing Facility | Payer: Medicare Other | Attending: Internal Medicine | Admitting: Internal Medicine

## 2013-05-02 DIAGNOSIS — N179 Acute kidney failure, unspecified: Secondary | ICD-10-CM | POA: Diagnosis present

## 2013-05-02 DIAGNOSIS — I4891 Unspecified atrial fibrillation: Secondary | ICD-10-CM | POA: Diagnosis present

## 2013-05-02 DIAGNOSIS — I059 Rheumatic mitral valve disease, unspecified: Secondary | ICD-10-CM

## 2013-05-02 DIAGNOSIS — J9600 Acute respiratory failure, unspecified whether with hypoxia or hypercapnia: Secondary | ICD-10-CM | POA: Diagnosis present

## 2013-05-02 DIAGNOSIS — I4892 Unspecified atrial flutter: Secondary | ICD-10-CM | POA: Diagnosis present

## 2013-05-02 DIAGNOSIS — I214 Non-ST elevation (NSTEMI) myocardial infarction: Principal | ICD-10-CM | POA: Diagnosis present

## 2013-05-02 DIAGNOSIS — I5023 Acute on chronic systolic (congestive) heart failure: Secondary | ICD-10-CM | POA: Diagnosis present

## 2013-05-02 DIAGNOSIS — Z96649 Presence of unspecified artificial hip joint: Secondary | ICD-10-CM

## 2013-05-02 DIAGNOSIS — M1710 Unilateral primary osteoarthritis, unspecified knee: Secondary | ICD-10-CM

## 2013-05-02 DIAGNOSIS — M545 Low back pain: Secondary | ICD-10-CM

## 2013-05-02 DIAGNOSIS — F17200 Nicotine dependence, unspecified, uncomplicated: Secondary | ICD-10-CM

## 2013-05-02 DIAGNOSIS — E872 Acidosis, unspecified: Secondary | ICD-10-CM | POA: Diagnosis present

## 2013-05-02 DIAGNOSIS — I10 Essential (primary) hypertension: Secondary | ICD-10-CM

## 2013-05-02 DIAGNOSIS — K761 Chronic passive congestion of liver: Secondary | ICD-10-CM | POA: Diagnosis present

## 2013-05-02 DIAGNOSIS — F70 Mild intellectual disabilities: Secondary | ICD-10-CM | POA: Diagnosis present

## 2013-05-02 DIAGNOSIS — Z720 Tobacco use: Secondary | ICD-10-CM | POA: Diagnosis present

## 2013-05-02 DIAGNOSIS — N183 Chronic kidney disease, stage 3 unspecified: Secondary | ICD-10-CM | POA: Diagnosis present

## 2013-05-02 DIAGNOSIS — J449 Chronic obstructive pulmonary disease, unspecified: Secondary | ICD-10-CM

## 2013-05-02 DIAGNOSIS — I50812 Chronic right heart failure: Secondary | ICD-10-CM

## 2013-05-02 DIAGNOSIS — J441 Chronic obstructive pulmonary disease with (acute) exacerbation: Secondary | ICD-10-CM | POA: Diagnosis present

## 2013-05-02 DIAGNOSIS — Z8774 Personal history of (corrected) congenital malformations of heart and circulatory system: Secondary | ICD-10-CM

## 2013-05-02 DIAGNOSIS — E875 Hyperkalemia: Secondary | ICD-10-CM | POA: Diagnosis not present

## 2013-05-02 DIAGNOSIS — I129 Hypertensive chronic kidney disease with stage 1 through stage 4 chronic kidney disease, or unspecified chronic kidney disease: Secondary | ICD-10-CM | POA: Diagnosis present

## 2013-05-02 DIAGNOSIS — I249 Acute ischemic heart disease, unspecified: Secondary | ICD-10-CM | POA: Diagnosis present

## 2013-05-02 DIAGNOSIS — I5021 Acute systolic (congestive) heart failure: Secondary | ICD-10-CM

## 2013-05-02 DIAGNOSIS — M169 Osteoarthritis of hip, unspecified: Secondary | ICD-10-CM

## 2013-05-02 DIAGNOSIS — I509 Heart failure, unspecified: Secondary | ICD-10-CM

## 2013-05-02 DIAGNOSIS — Z8249 Family history of ischemic heart disease and other diseases of the circulatory system: Secondary | ICD-10-CM

## 2013-05-02 DIAGNOSIS — Q211 Atrial septal defect: Secondary | ICD-10-CM

## 2013-05-02 DIAGNOSIS — I2589 Other forms of chronic ischemic heart disease: Secondary | ICD-10-CM | POA: Diagnosis present

## 2013-05-02 DIAGNOSIS — I2510 Atherosclerotic heart disease of native coronary artery without angina pectoris: Secondary | ICD-10-CM | POA: Diagnosis present

## 2013-05-02 DIAGNOSIS — Z8679 Personal history of other diseases of the circulatory system: Secondary | ICD-10-CM

## 2013-05-02 DIAGNOSIS — Q2111 Secundum atrial septal defect: Secondary | ICD-10-CM

## 2013-05-02 DIAGNOSIS — I255 Ischemic cardiomyopathy: Secondary | ICD-10-CM

## 2013-05-02 HISTORY — DX: Unspecified osteoarthritis, unspecified site: M19.90

## 2013-05-02 HISTORY — DX: Mild intellectual disabilities: F70

## 2013-05-02 HISTORY — DX: Essential (primary) hypertension: I10

## 2013-05-02 HISTORY — DX: Tobacco use: Z72.0

## 2013-05-02 HISTORY — DX: Chronic obstructive pulmonary disease, unspecified: J44.9

## 2013-05-02 HISTORY — DX: Personal history of (corrected) congenital malformations of heart and circulatory system: Z87.74

## 2013-05-02 HISTORY — DX: Personal history of other diseases of the circulatory system: Z86.79

## 2013-05-02 LAB — COMPREHENSIVE METABOLIC PANEL
ALT: 175 U/L — ABNORMAL HIGH (ref 0–35)
BUN: 23 mg/dL (ref 6–23)
CO2: 25 mEq/L (ref 19–32)
Calcium: 10 mg/dL (ref 8.4–10.5)
Creatinine, Ser: 1.27 mg/dL — ABNORMAL HIGH (ref 0.50–1.10)
GFR calc Af Amer: 49 mL/min — ABNORMAL LOW (ref >90)
GFR calc non Af Amer: 42 mL/min — ABNORMAL LOW (ref >90)
Glucose, Bld: 121 mg/dL — ABNORMAL HIGH (ref 70–99)
Sodium: 137 mEq/L (ref 135–145)

## 2013-05-02 LAB — TROPONIN I
Troponin I: 0.39 ng/mL (ref <0.30)
Troponin I: 1.43 ng/mL (ref <0.30)
Troponin I: 0.63 ng/mL (ref <0.30)

## 2013-05-02 LAB — CBC
HCT: 38.4 % (ref 36.0–46.0)
Hemoglobin: 13 g/dL (ref 12.0–15.0)
MCH: 31.5 pg (ref 26.0–34.0)
MCHC: 33.9 g/dL (ref 30.0–36.0)
RBC: 4.13 MIL/uL (ref 3.87–5.11)
RDW: 13.7 % (ref 11.5–15.5)

## 2013-05-02 LAB — CBC WITH DIFFERENTIAL/PLATELET
Basophils Absolute: 0 10*3/uL (ref 0.0–0.1)
Basophils Relative: 0 % (ref 0–1)
Hemoglobin: 13.8 g/dL (ref 12.0–15.0)
MCHC: 32.5 g/dL (ref 30.0–36.0)
Monocytes Relative: 6 % (ref 3–12)
Neutro Abs: 13.3 10*3/uL — ABNORMAL HIGH (ref 1.7–7.7)
Neutrophils Relative %: 85 % — ABNORMAL HIGH (ref 43–77)
WBC: 15.7 10*3/uL — ABNORMAL HIGH (ref 4.0–10.5)

## 2013-05-02 LAB — URINALYSIS, ROUTINE W REFLEX MICROSCOPIC
Glucose, UA: NEGATIVE mg/dL
Nitrite: NEGATIVE
Protein, ur: NEGATIVE mg/dL
Specific Gravity, Urine: 1.01 (ref 1.005–1.030)
Urobilinogen, UA: 0.2 mg/dL (ref 0.0–1.0)

## 2013-05-02 LAB — BLOOD GAS, ARTERIAL
Drawn by: 317771
O2 Content: 3 L/min
pCO2 arterial: 45 mmHg (ref 35.0–45.0)
pH, Arterial: 7.354 (ref 7.350–7.450)

## 2013-05-02 LAB — CREATININE, SERUM
Creatinine, Ser: 1.22 mg/dL — ABNORMAL HIGH (ref 0.50–1.10)
GFR calc non Af Amer: 44 mL/min — ABNORMAL LOW (ref >90)

## 2013-05-02 LAB — TSH: TSH: 1.577 u[IU]/mL (ref 0.350–4.500)

## 2013-05-02 LAB — URINE MICROSCOPIC-ADD ON

## 2013-05-02 LAB — LACTIC ACID, PLASMA: Lactic Acid, Venous: 3.3 mmol/L — ABNORMAL HIGH (ref 0.5–2.2)

## 2013-05-02 MED ORDER — HEPARIN (PORCINE) IN NACL 100-0.45 UNIT/ML-% IJ SOLN
750.0000 [IU]/h | INTRAMUSCULAR | Status: DC
  Administered 2013-05-02: 21:00:00 650 [IU]/h via INTRAVENOUS
  Administered 2013-05-04: 750 [IU]/h via INTRAVENOUS
  Filled 2013-05-02 (×2): qty 250

## 2013-05-02 MED ORDER — FUROSEMIDE 40 MG PO TABS
40.0000 mg | ORAL_TABLET | Freq: Every day | ORAL | Status: DC
  Administered 2013-05-03 – 2013-05-07 (×5): 40 mg via ORAL
  Filled 2013-05-02 (×7): qty 1

## 2013-05-02 MED ORDER — DEXTROSE 5 % IV SOLN
1.0000 g | INTRAVENOUS | Status: DC
  Administered 2013-05-02 – 2013-05-06 (×5): 1 g via INTRAVENOUS
  Filled 2013-05-02 (×6): qty 10

## 2013-05-02 MED ORDER — FUROSEMIDE 10 MG/ML IJ SOLN
80.0000 mg | Freq: Once | INTRAMUSCULAR | Status: AC
  Administered 2013-05-02: 80 mg via INTRAVENOUS
  Filled 2013-05-02: qty 8

## 2013-05-02 MED ORDER — ENOXAPARIN SODIUM 30 MG/0.3ML ~~LOC~~ SOLN
30.0000 mg | SUBCUTANEOUS | Status: DC
  Administered 2013-05-02: 30 mg via SUBCUTANEOUS
  Filled 2013-05-02: qty 0.3

## 2013-05-02 MED ORDER — HEPARIN BOLUS VIA INFUSION
1000.0000 [IU] | Freq: Once | INTRAVENOUS | Status: AC
  Administered 2013-05-02: 1000 [IU] via INTRAVENOUS
  Filled 2013-05-02: qty 1000

## 2013-05-02 MED ORDER — ALBUTEROL SULFATE (5 MG/ML) 0.5% IN NEBU: 2.5000 mg | INHALATION_SOLUTION | Freq: Four times a day (QID) | RESPIRATORY_TRACT | Status: DC | PRN

## 2013-05-02 MED ORDER — SODIUM CHLORIDE 0.9 % IV SOLN
Freq: Once | INTRAVENOUS | Status: AC
  Administered 2013-05-02: 12:00:00 75 mL/h via INTRAVENOUS

## 2013-05-02 MED ORDER — IPRATROPIUM BROMIDE 0.02 % IN SOLN
0.5000 mg | Freq: Once | RESPIRATORY_TRACT | Status: AC
  Administered 2013-05-02: 0.5 mg via RESPIRATORY_TRACT
  Filled 2013-05-02: qty 2.5

## 2013-05-02 MED ORDER — NITROGLYCERIN IN D5W 200-5 MCG/ML-% IV SOLN: 5.0000 ug/min | INTRAVENOUS | Status: DC

## 2013-05-02 MED ORDER — IPRATROPIUM BROMIDE 0.02 % IN SOLN: 0.5000 mg | RESPIRATORY_TRACT | Status: DC | PRN

## 2013-05-02 MED ORDER — ATORVASTATIN CALCIUM 80 MG PO TABS
80.0000 mg | ORAL_TABLET | Freq: Every day | ORAL | Status: DC
  Administered 2013-05-02 – 2013-05-10 (×8): 80 mg via ORAL
  Filled 2013-05-02 (×11): qty 1

## 2013-05-02 MED ORDER — SODIUM CHLORIDE 0.9 % IV SOLN
INTRAVENOUS | Status: DC
  Administered 2013-05-02: 03:00:00 via INTRAVENOUS

## 2013-05-02 MED ORDER — ASPIRIN EC 81 MG PO TBEC
81.0000 mg | DELAYED_RELEASE_TABLET | Freq: Every day | ORAL | Status: DC
  Administered 2013-05-02 – 2013-05-11 (×10): 81 mg via ORAL
  Filled 2013-05-02 (×11): qty 1

## 2013-05-02 MED ORDER — ONDANSETRON HCL 4 MG/2ML IJ SOLN: 4.0000 mg | Freq: Four times a day (QID) | INTRAMUSCULAR | Status: DC | PRN

## 2013-05-02 MED ORDER — SODIUM CHLORIDE 0.9 % IJ SOLN: 3.0000 mL | INTRAMUSCULAR | Status: DC | PRN

## 2013-05-02 MED ORDER — IPRATROPIUM BROMIDE 0.02 % IN SOLN
0.5000 mg | RESPIRATORY_TRACT | Status: DC
  Administered 2013-05-02 (×3): 0.5 mg via RESPIRATORY_TRACT
  Filled 2013-05-02 (×3): qty 2.5

## 2013-05-02 MED ORDER — METHYLPREDNISOLONE SODIUM SUCC 125 MG IJ SOLR
60.0000 mg | Freq: Four times a day (QID) | INTRAMUSCULAR | Status: DC
  Administered 2013-05-02 – 2013-05-04 (×8): 60 mg via INTRAVENOUS
  Filled 2013-05-02 (×7): qty 0.96
  Filled 2013-05-02: qty 2
  Filled 2013-05-02 (×5): qty 0.96

## 2013-05-02 MED ORDER — SODIUM CHLORIDE 0.9 % IV SOLN: INTRAVENOUS | Status: DC

## 2013-05-02 MED ORDER — TRANDOLAPRIL 4 MG PO TABS
4.0000 mg | ORAL_TABLET | Freq: Every day | ORAL | Status: DC
  Administered 2013-05-02 – 2013-05-07 (×6): 4 mg via ORAL
  Filled 2013-05-02 (×8): qty 1

## 2013-05-02 MED ORDER — NITROGLYCERIN IN D5W 200-5 MCG/ML-% IV SOLN
5.0000 ug/min | Freq: Once | INTRAVENOUS | Status: AC
  Administered 2013-05-02: 5 ug/min via INTRAVENOUS
  Filled 2013-05-02: qty 250

## 2013-05-02 MED ORDER — ACETAMINOPHEN 325 MG PO TABS
650.0000 mg | ORAL_TABLET | ORAL | Status: DC | PRN
  Administered 2013-05-03: 650 mg via ORAL
  Filled 2013-05-02: qty 2

## 2013-05-02 MED ORDER — SODIUM CHLORIDE 0.9 % IV SOLN: 250.0000 mL | INTRAVENOUS | Status: DC | PRN

## 2013-05-02 MED ORDER — SODIUM CHLORIDE 0.9 % IJ SOLN
3.0000 mL | Freq: Two times a day (BID) | INTRAMUSCULAR | Status: DC
  Administered 2013-05-02 – 2013-05-11 (×13): 3 mL via INTRAVENOUS

## 2013-05-02 MED ORDER — ALBUTEROL SULFATE (5 MG/ML) 0.5% IN NEBU
2.5000 mg | INHALATION_SOLUTION | Freq: Once | RESPIRATORY_TRACT | Status: AC
  Administered 2013-05-02: 2.5 mg via RESPIRATORY_TRACT
  Filled 2013-05-02: qty 0.5

## 2013-05-02 MED ORDER — ALBUTEROL SULFATE (5 MG/ML) 0.5% IN NEBU
2.5000 mg | INHALATION_SOLUTION | RESPIRATORY_TRACT | Status: DC
  Administered 2013-05-02 (×3): 2.5 mg via RESPIRATORY_TRACT
  Filled 2013-05-02 (×3): qty 0.5

## 2013-05-02 MED ORDER — ALBUTEROL SULFATE (5 MG/ML) 0.5% IN NEBU
2.5000 mg | INHALATION_SOLUTION | RESPIRATORY_TRACT | Status: DC | PRN
  Filled 2013-05-02 (×2): qty 0.5

## 2013-05-02 MED ORDER — PANTOPRAZOLE SODIUM 40 MG PO TBEC
40.0000 mg | DELAYED_RELEASE_TABLET | Freq: Every day | ORAL | Status: DC
  Administered 2013-05-02 – 2013-05-11 (×9): 40 mg via ORAL
  Filled 2013-05-02 (×8): qty 1

## 2013-05-02 NOTE — Progress Notes (Signed)
  Echocardiogram 2D Echocardiogram has been performed.  Deavin Forst FRANCES 05/02/2013, 10:34 AM

## 2013-05-02 NOTE — ED Notes (Signed)
Patient has voided incontinently in bed with no advance notice, unable to measure output.  MD informed and orders received

## 2013-05-02 NOTE — ED Notes (Signed)
Dr. Deretha Emory notified of Troponin of 0.39

## 2013-05-02 NOTE — Consult Note (Addendum)
[facility] Medical Consultation  Alexis Dillon XBJ:[id number] DOB: 1946-01-30 DOA: 05/02/2013 PCP: Alexis Maudlin, MD   Requesting physician: Dr. Elease Hashimoto Date of consultation: 05/02/13 Reason for consultation: SOB, leukocytosis  Impression/Recommendations Active Problems:   HIGH BLOOD PRESSURE   CHF (congestive heart failure)   COPD (chronic obstructive pulmonary disease)   Tobacco abuse   Hypertension   History of atrial septal defect   Mental retardation, idiopathic mild   1. Hypoxia 1. Likely secondary to combined COPD/chf 2. End-expiratory wheezing on exam with no LE edema 3. Will continue patient on scheduled duonebs q4hrs with q2hrs PRN 4. Cardiology following - holding diuretics for today 5. Cont on scheduled IV steroids 6. Will officially transfer of care to Hospitalist service 2. CHF 1. Agree with holding diuretics for today 2. Will give gentle IVF as tolerated (see below) 3. UTI 1. Urine cx obtained and is pending 2. Mild leukocytosis, afebrile 3. NKDA 4. Will cont empirically on rocephin 4. Elevated lactate 1. Consider possible from over diuresis vs UTI? 2. Discussed with Cardiology - OK for gentle IVF for today 3. Will follow 5. Acute renal failure 1. Cr of 1.27 following 80mg  of lasix 2. Agree with holding further diuretics today 3. Cont with gentle IVF 4. Cont to hold ACEI, but would resume when renal function normalizes 5. Follow renal fx closely 6. DVT prophylaxis 1. Heparin subQ  Chief Complaint: SOB  HPI:  Pt is a 67yo female with a hx of mental retardation, COPD, chf, who presented to the ED at [facility] with complaints of increased shortness of breath. The patient was noted to have o2 sats on RA of the mid 70's. A cxr demonstrated evidence of chf with hyperinflated lungs. She was also noted to have a BNP of over 11K with an elevated troponin and EKG findings worrisome for ACS. The patient was transferred to the cardiology service for  further management. Upon arrival to [facility], the hospitalist service was consulted regarding leukocytosis and concerns about COPD management.  The patient is a poor historian, however, on further questioning, pt admits having sob with wheezing that started shortly after smoking just prior to ED visit. Reports feeling much improved with neb tx in ED. Pt and family in room both acknowledge that the patient appears much better now since her visit in the ED this AM. No cp or palpitations.  Review of Systems:  Per above, the remainder of the 10pt ros reviewed and are neg  Past Medical History  Diagnosis Date  . COPD (chronic obstructive pulmonary disease)   . Tobacco abuse   . Hypertension   . History of atrial septal defect     Closure in 50's   . Mental retardation, idiopathic mild    Past Surgical History  Procedure Laterality Date  . Asd repair      in her 34s  . Total hip arthroplasty Right     in her 50s  . Total abdominal hysterectomy      in the 47s   Social History:  reports that she has been smoking.  She does not have any smokeless tobacco history on file. She reports that she does not drink alcohol or use illicit drugs.  No Known Allergies Family History  Problem Relation Age of Onset  . Heart attack Brother   . Heart attack Brother   . Diabetes Mother   . Heart attack Father     Died at 48 from MI  . Heart attack Sister  stent at 65  . Heart attack Sister     stent at 47  . Cancer Brother     lung  . Cancer Brother     lung     Prior to Admission medications   Medication Sig Start Date End Date Taking? Authorizing Provider  albuterol (PROVENTIL) (2.5 MG/3ML) 0.083% nebulizer solution Take 2.5 mg by nebulization every 6 (six) hours as needed for wheezing or shortness of breath.   Yes Historical Provider, MD  ipratropium (ATROVENT HFA) 17 MCG/ACT inhaler Inhale 1 puff into the lungs every 6 (six) hours as needed for wheezing.   Yes Historical Provider, MD   pantoprazole (PROTONIX) 40 MG tablet Take 40 mg by mouth daily.   Yes Historical Provider, MD  trandolapril (MAVIK) 4 MG tablet Take 4 mg by mouth daily.   Yes Historical Provider, MD   Physical Exam: Blood pressure 93/77, pulse 80, temperature 98 F (36.7 C), temperature source Oral, resp. rate 20, height 5\' 5"  (1.651 m), weight 44.4 kg (97 lb 14.2 oz), SpO2 97.00%. Filed Vitals:   05/02/13 0441 05/02/13 0500 05/02/13 0709 05/02/13 0730  BP: 139/75 115/72 93/77   Pulse: 96 93 80   Temp:   98 F (36.7 C)   TempSrc:   Oral   Resp: 15 28 20 20   Height:   5\' 5"  (1.651 m)   Weight:   44.4 kg (97 lb 14.2 oz)   SpO2: 96% 100% 96% 97%     General:  Awake, in nad  Eyes: PERRL B  ENT: membranes moist, dentition fair  Neck: trachea midline, neck supple  Cardiovascular: regular, s1, s2  Respiratory: normal resp effort, mild end-expiratory wheezing in all lung fields  Abdomen: soft, nondistended  Skin: normal skin turgor, no wheezing  Musculoskeletal: perfused, no clubbing  Psychiatric: mood/affect normal // no auditory/visual hallucinations  Neurologic: cn2-12 grossly intact, strength/sensation intact  Labs on Admission:  Basic Metabolic Panel:  Recent Labs Lab 05/02/13 0236  NA 137  K 3.8  CL 98  CO2 25  GLUCOSE 121*  BUN 23  CREATININE 1.27*  CALCIUM 10.0   Liver Function Tests:  Recent Labs Lab 05/02/13 0236  AST 255*  ALT 175*  ALKPHOS 192*  BILITOT 0.3  PROT 7.1  ALBUMIN 4.0   No results found for this basename: LIPASE, AMYLASE,  in the last 168 hours No results found for this basename: AMMONIA,  in the last 168 hours CBC:  Recent Labs Lab 05/02/13 0236  WBC 15.7*  NEUTROABS 13.3*  HGB 13.8  HCT 42.4  MCV 95.9  PLT 228   Cardiac Enzymes:  Recent Labs Lab 05/02/13 0258  TROPONINI 0.39*   BNP: No components found with this basename: POCBNP,  CBG: No results found for this basename: GLUCAP,  in the last 168 hours  Radiological  Exams on Admission: Dg Chest Port 1 View  05/02/2013   CLINICAL DATA:  Cough and shortness of breath. Wheezing.  EXAM: PORTABLE CHEST - 1 VIEW  COMPARISON:  12/14/2006.  FINDINGS: Chronic cardiomegaly. Status post median sternotomy. New, diffuse interstitial opacity. Haziness of the lower chest, likely small effusions. No pneumothorax. Chronic pulmonary hyperinflation.  IMPRESSION: 1. CHF with trace pleural effusions. 2. COPD.   Electronically Signed   By: Tiburcio Pea M.D.   On: 05/02/2013 03:01   Time spent:  Tameisha Covell K [facility] Pager [phone]  If 7PM-7AM, please contact night-coverage [email or web address] Password [email or web address] 05/02/2013, 10:59 AM

## 2013-05-02 NOTE — Progress Notes (Signed)
ANTICOAGULATION CONSULT NOTE - Initial Consult  Pharmacy Consult for heparin Indication: chest pain/ACS  No Known Allergies  Patient Measurements: Height: 5\' 5"  (165.1 cm) Weight: 97 lb 14.2 oz (44.4 kg) (bed scal) IBW/kg (Calculated) : 57 Heparin Dosing Weight: 44.4 kg  Vital Signs: Temp: 98.2 F (36.8 C) (12/17 1408) Temp src: Oral (12/17 1408) BP: 112/71 mmHg (12/17 1830) Pulse Rate: 82 (12/17 1830)  Labs:  Recent Labs  05/02/13 0236 05/02/13 0258 05/02/13 1230 05/02/13 1730  HGB 13.8  --  13.0  --   HCT 42.4  --  38.4  --   PLT 228  --  211  --   CREATININE 1.27*  --  1.22*  --   TROPONINI  --  0.39*  --  1.43*    Estimated Creatinine Clearance: 30.9 ml/min (by C-G formula based on Cr of 1.22).   Medical History: Past Medical History  Diagnosis Date  . COPD (chronic obstructive pulmonary disease)   . Tobacco abuse   . Hypertension   . History of atrial septal defect     Closure in 50's   . Mental retardation, idiopathic mild   . Arthritis     Medications:  Prescriptions prior to admission  Medication Sig Dispense Refill  . albuterol (PROVENTIL) (2.5 MG/3ML) 0.083% nebulizer solution Take 2.5 mg by nebulization every 6 (six) hours as needed for wheezing or shortness of breath.      Marland Kitchen ipratropium (ATROVENT HFA) 17 MCG/ACT inhaler Inhale 1 puff into the lungs every 6 (six) hours as needed for wheezing.      . pantoprazole (PROTONIX) 40 MG tablet Take 40 mg by mouth daily.      . trandolapril (MAVIK) 4 MG tablet Take 4 mg by mouth daily.        Assessment: 67 yo lady to start heparin for r/o ACS.  She was on no blood thinners PTA.  Baseline Hg 13.0, PTLC 211.  She did receive a dose of lovenox 30 mg at 13:47 today Goal of Therapy:  Heparin level 0.3-0.7 units/ml Monitor platelets by anticoagulation protocol: Yes   Plan:  Heparin 1000 unit bolus and drip at 650 units/hr Check heparin level and CBC 8 hours after start then daily while on  heparin  Shakura Cowing Poteet 05/02/2013,6:58 PM

## 2013-05-02 NOTE — ED Provider Notes (Signed)
CSN: [id number]     Arrival date & time 05/02/13  0213 History   First MD Initiated Contact with Patient 05/02/13 0236     Chief Complaint  Patient presents with  . Cough  . Shortness of Breath  . Wheezing   (Consider location/radiation/quality/duration/timing/severity/associated sxs/prior Treatment) The history is provided by the patient.   67 year old female brought in by friend. For acute shortness of breath. This started 2 days ago. Not associated with chest pain. Associated with a cough of clear whitish stuff. No fevers. Patient has not followed by cardiology. Patient with a history of hypertension. Patient denied chest pain. Patient got acutely short of breath here tonight upon arrival was satting in the 80s on room air. Patient immediately started on 2 L of nasal cannula oxygen with some improvement in the oxygen level. History reviewed. No pertinent past medical history. History reviewed. No pertinent past surgical history. No family history on file. History  Substance Use Topics  . Smoking status: Current Every Day Smoker -- 0.50 packs/day  . Smokeless tobacco: Not on file  . Alcohol Use: No   OB History   Grav Para Term Preterm Abortions TAB SAB Ect Mult Living                 Review of Systems  Constitutional: Negative for fever.  HENT: Positive for congestion.   Eyes: Negative for redness.  Respiratory: Positive for cough and shortness of breath.   Gastrointestinal: Negative for nausea, vomiting and abdominal pain.  Genitourinary: Negative for dysuria.  Musculoskeletal: Negative for back pain.  Skin: Negative for rash.  Neurological: Negative for headaches.  Hematological: Does not bruise/bleed easily.  Psychiatric/Behavioral: Negative for confusion.    Allergies  Review of patient's allergies indicates not on file.  Home Medications  No current outpatient prescriptions on file. BP 115/72  Pulse 93  Temp(Src) 97.6 F (36.4 C) (Oral)  Resp 28  Ht 5\' 5"   (1.651 m)  Wt 101 lb (45.813 kg)  BMI 16.81 kg/m2  SpO2 100% Physical Exam  Nursing note and vitals reviewed. Constitutional: She is oriented to person, place, and time. She appears well-developed and well-nourished. She appears distressed.  HENT:  Head: Normocephalic and atraumatic.  Mouth/Throat: Oropharynx is clear and moist.  Eyes: Conjunctivae and EOM are normal. Pupils are equal, round, and reactive to light.  Neck: Normal range of motion.  Cardiovascular: Normal rate, regular rhythm and normal heart sounds.   No murmur heard. Tachycardic.  Pulmonary/Chest: Breath sounds normal. She is in respiratory distress.  Decreased breath sounds bilaterally no wheezing.  Abdominal: Soft. Bowel sounds are normal. There is no tenderness.  Musculoskeletal: Normal range of motion. She exhibits no edema.  Neurological: She is alert and oriented to person, place, and time. No cranial nerve deficit. She exhibits normal muscle tone. Coordination normal.  Skin: Skin is warm. No rash noted.    ED Course  Procedures (including critical care time) Labs Review Labs Reviewed  COMPREHENSIVE METABOLIC PANEL - Abnormal; Notable for the following:    Glucose, Bld 121 (*)    Creatinine, Ser 1.27 (*)    AST 255 (*)    ALT 175 (*)    Alkaline Phosphatase 192 (*)    GFR calc non Af Amer 42 (*)    GFR calc Af Amer 49 (*)    All other components within normal limits  CBC WITH DIFFERENTIAL - Abnormal; Notable for the following:    WBC 15.7 (*)    Neutrophils  Relative % 85 (*)    Neutro Abs 13.3 (*)    Lymphocytes Relative 9 (*)    All other components within normal limits  PRO B NATRIURETIC PEPTIDE - Abnormal; Notable for the following:    Pro B Natriuretic peptide (BNP) 11638.0 (*)    All other components within normal limits  LACTIC ACID, PLASMA - Abnormal; Notable for the following:    Lactic Acid, Venous 3.3 (*)    All other components within normal limits  BLOOD GAS, ARTERIAL - Abnormal;  Notable for the following:    pO2, Arterial 55.8 (*)    Bicarbonate 24.4 (*)    All other components within normal limits  TROPONIN I - Abnormal; Notable for the following:    Troponin I 0.39 (*)    All other components within normal limits  CULTURE, BLOOD (ROUTINE X 2)  CULTURE, BLOOD (ROUTINE X 2)  URINALYSIS, ROUTINE W REFLEX MICROSCOPIC   Results for orders placed during the hospital encounter of 05/02/13  CULTURE, BLOOD (ROUTINE X 2)      Result Value Range   Specimen Description BLOOD RIGHT ANTECUBITAL     Special Requests       Value: BOTTLES DRAWN AEROBIC AND ANAEROBIC AEB 4CC ANA 2CC   Culture PENDING     Report Status PENDING    CULTURE, BLOOD (ROUTINE X 2)      Result Value Range   Specimen Description BLOOD RIGHT ANTECUBITAL     Special Requests BOTTLES DRAWN AEROBIC ONLY 4CC     Culture PENDING     Report Status PENDING    COMPREHENSIVE METABOLIC PANEL      Result Value Range   Sodium 137  135 - 145 mEq/L   Potassium 3.8  3.5 - 5.1 mEq/L   Chloride 98  96 - 112 mEq/L   CO2 25  19 - 32 mEq/L   Glucose, Bld 121 (*) 70 - 99 mg/dL   BUN 23  6 - 23 mg/dL   Creatinine, Ser 3.08 (*) 0.50 - 1.10 mg/dL   Calcium 65.7  8.4 - 84.6 mg/dL   Total Protein 7.1  6.0 - 8.3 g/dL   Albumin 4.0  3.5 - 5.2 g/dL   AST 962 (*) 0 - 37 U/L   ALT 175 (*) 0 - 35 U/L   Alkaline Phosphatase 192 (*) 39 - 117 U/L   Total Bilirubin 0.3  0.3 - 1.2 mg/dL   GFR calc non Af Amer 42 (*) >90 mL/min   GFR calc Af Amer 49 (*) >90 mL/min  CBC WITH DIFFERENTIAL      Result Value Range   WBC 15.7 (*) 4.0 - 10.5 K/uL   RBC 4.42  3.87 - 5.11 MIL/uL   Hemoglobin 13.8  12.0 - 15.0 g/dL   HCT 95.2  84.1 - 32.4 %   MCV 95.9  78.0 - 100.0 fL   MCH 31.2  26.0 - 34.0 pg   MCHC 32.5  30.0 - 36.0 g/dL   RDW 40.1  02.7 - 25.3 %   Platelets 228  150 - 400 K/uL   Neutrophils Relative % 85 (*) 43 - 77 %   Neutro Abs 13.3 (*) 1.7 - 7.7 K/uL   Lymphocytes Relative 9 (*) 12 - 46 %   Lymphs Abs 1.3  0.7 -  4.0 K/uL   Monocytes Relative 6  3 - 12 %   Monocytes Absolute 0.9  0.1 - 1.0 K/uL   Eosinophils Relative 0  0 - 5 %   Eosinophils Absolute 0.1  0.0 - 0.7 K/uL   Basophils Relative 0  0 - 1 %   Basophils Absolute 0.0  0.0 - 0.1 K/uL  PRO B NATRIURETIC PEPTIDE      Result Value Range   Pro B Natriuretic peptide (BNP) 11638.0 (*) 0 - 125 pg/mL  LACTIC ACID, PLASMA      Result Value Range   Lactic Acid, Venous 3.3 (*) 0.5 - 2.2 mmol/L  BLOOD GAS, ARTERIAL      Result Value Range   O2 Content 3.0     Delivery systems NASAL CANNULA     pH, Arterial 7.354  7.350 - 7.450   pCO2 arterial 45.0  35.0 - 45.0 mmHg   pO2, Arterial 55.8 (*) 80.0 - 100.0 mmHg   Bicarbonate 24.4 (*) 20.0 - 24.0 mEq/L   TCO2 22.0  0 - 100 mmol/L   Acid-base deficit 0.4  0.0 - 2.0 mmol/L   O2 Saturation 85.9     Patient temperature 37.0     Collection site RIGHT RADIAL     Drawn by [id number]     Sample type ARTERIAL DRAW     Allens test (pass/fail) PASS  PASS  TROPONIN I      Result Value Range   Troponin I 0.39 (*) <0.30 ng/mL    Imaging Review Dg Chest Port 1 View  05/02/2013   CLINICAL DATA:  Cough and shortness of breath. Wheezing.  EXAM: PORTABLE CHEST - 1 VIEW  COMPARISON:  12/14/2006.  FINDINGS: Chronic cardiomegaly. Status post median sternotomy. New, diffuse interstitial opacity. Haziness of the lower chest, likely small effusions. No pneumothorax. Chronic pulmonary hyperinflation.  IMPRESSION: 1. CHF with trace pleural effusions. 2. COPD.   Electronically Signed   By: Tiburcio Pea M.D.   On: 05/02/2013 03:01    EKG Interpretation   None      Date: 05/02/2013  Rate: 112  Rhythm: sinus tachycardia and premature atrial contractions (PAC)  QRS Axis: normal  Intervals: normal  ST/T Wave abnormalities: ST depressions laterally  Conduction Disutrbances:none  Narrative Interpretation:   Old EKG Reviewed: changes noted EKG compared to 12/14/2006 shows ST segment depression laterally suggestive of  subendocardial injury.  CRITICAL CARE Performed by: Shelda Jakes. Total critical care time: 30 Critical care time was exclusive of separately billable procedures and treating other patients. Critical care was necessary to treat or prevent imminent or life-threatening deterioration. Critical care was time spent personally by me on the following activities: development of treatment plan with patient and/or surrogate as well as nursing, discussions with consultants, evaluation of patient's response to treatment, examination of patient, obtaining history from patient or surrogate, ordering and performing treatments and interventions, ordering and review of laboratory studies, ordering and review of radiographic studies, pulse oximetry and re-evaluation of patient's condition.   MDM   1. CHF (congestive heart failure)    Patient with acute onset of shortness of breath 2 days ago to get worse. Patient with a frothy productive cough. Salt consistent with new-onset congestive heart failure. Patient portions elevated EKG is ST segment depression laterally suggestive of subendocardial ischemia. Patient treated with nitroglycerin drip and Lasix in the emergency department with improvement. As stated chest x-ray was consistent with pulmonary edema. Not pneumonia. Patient's afebrile patient did have an elevated lactic acid but do not think that this is an infectious process. Patient blood pressure was elevated initially she been taking Sudafed a lot last several days thinking  that she had congestion. More as an phlegm kind of congestion. Following the Lasix and nitroglycerin drip outpatient readings improved. Initial blood gas on 2 L showed hypoxia no acidosis no elevation in the PCO2. Patient will still need to have 2 L of oxygen but her sats are in the mid 90s now. Patient transport by [facility] down to [facility] for cardiology admission. Discussed on the phone with Dr. Carolanne Grumbling. Excepting cardiologist will be  Dr. Patty Sermons.    Shelda Jakes, MD 05/02/13 [phone]

## 2013-05-02 NOTE — Progress Notes (Signed)
Critical lab called to Card PA Katie troponin of 1.43

## 2013-05-02 NOTE — ED Notes (Signed)
Pt states she has been coughing up x 2 day. Pt states she stated wheezing and coughing up white sputum with sob tonight.

## 2013-05-02 NOTE — H&P (Signed)
Patient ID: Alexis Dillon MRN: [id number], DOB/AGE: [birth year]   Admit date: 05/02/2013   Primary Physician: Fredirick Maudlin, MD Primary Cardiologist: New   Pt. Profile: Alexis Dillon is a 67 y.o. 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 She was admitted last night around 7 pm with worsening productive cough and dyspnea. She was found to have an elevated troponin and abnormal EKG with ST changes and was transferred to [facility]. Patient got acutely short of breath upon arrival and was satting in the 80s on room air. Patient immediately started on 2 L of nasal cannula oxygen with some improvement in the oxygen level. She was give 80 mg IV Lasix around 3am with ~1.5L output.   She reports chest pain this morning that lasted a few seconds. She started noticing chest discomfort x1 week. It comes and goes and sometimes makes her feel like she is going to pass out. She is a poor historian 2/2 her mental retardation so the details are difficult to report. Patient with a strong family history of heart disease. Father died of MI and age 51. All of her brothers and sisters have had coronary events as well. All smokers.    Problem List  Past Medical History  Diagnosis Date  . COPD (chronic obstructive pulmonary disease)   . Tobacco abuse   . Hypertension   . History of atrial septal defect     Closure in 50's   . Mental retardation, idiopathic mild     History reviewed. No pertinent past surgical history.   Allergies  No Known Allergies   Home Medications  Prior to Admission medications   Medication Sig Start Date End Date Taking? Authorizing Provider  PRESCRIPTION MEDICATION Stomach medication   Yes Historical Provider, MD  PRESCRIPTION MEDICATION Take 1 tablet by mouth daily. Blood pressure medication   Yes  Historical Provider, MD  PRESCRIPTION MEDICATION Inhale 1 puff into the lungs 2 (two) times daily as needed (for wheezing and shortness of breath). Green inhaler   Yes Historical Provider, MD    Family History  Family History  Problem Relation Age of Onset  . Heart attack Brother   . Heart attack Brother   . Diabetes Mother   . Heart attack Father     Died at 34 from MI  . Heart attack Sister     stent at 57  . Heart attack Sister     stent at 16  . Cancer Brother     lung  . Cancer Brother     lung     Social History  History   Social History  . Marital Status: Divorced    Spouse Name: N/A    Number of Children: N/A  . Years of Education: N/A   Occupational History  . Not on file.   Social History Main Topics  . Smoking status: Current Every Day Smoker -- 1.00 packs/day for 55 years  . Smokeless tobacco: Not on file  . Alcohol Use: No  . Drug Use: No  . Sexual Activity: Not on file   Other Topics Concern  . Not on file   Social History Narrative   She has mild mental retardation. Patient not married and lives with her sister. Divorced with no children.      Review of Systems  General:  No chills, fever, night sweats. Recent weight loss, unclear how much Cardiovascular:  + chest pain, +dyspnea, NO edema, orthopnea, palpitations, paroxysmal nocturnal dyspnea. Dermatological: No rash, lesions/masses Respiratory: + cough, +dyspnea Urologic: No hematuria, dysuria Abdominal:   No nausea, vomiting, diarrhea, bright red blood per rectum, melena, or hematemesis Neurologic:  No visual changes, wkns, changes in mental status.   All other systems reviewed and are otherwise negative except as noted above.  Physical Exam  Blood pressure 93/77, pulse 80, temperature 98 F (36.7 C), temperature source Oral, resp. rate 20, height 5\' 5"  (1.651 m), weight 97 lb 14.2 oz (44.4 kg), SpO2 97.00%.  General: Pleasant, NAD, Scattered due to her mental retardation Psych: Normal  affect. Difficult to focus Neuro: Alert and oriented X 3. Moves all extremities spontaneously.  HEENT: Normal  Neck: Supple without bruits or  + mild JVD. Lungs:  Resp with wheezes and rhonchi, unlabored. Chronic productive cough, sputum is clearish white Heart: RRR no s3, s4, or murmurs. Abdomen: Soft, non-tender, non-distended, BS + x 4.  Extremities: No clubbing, cyanosis or edema. DP/PT/Radials 1+ and equal bilaterally.  Labs  Recent Labs  05/02/13 0258  TROPONINI 0.39*   Lab Results  Component Value Date   WBC 15.7* 05/02/2013   HGB 13.8 05/02/2013   HCT 42.4 05/02/2013   MCV 95.9 05/02/2013   PLT 228 05/02/2013     Recent Labs Lab 05/02/13 0236  NA 137  K 3.8  CL 98  CO2 25  BUN 23  CREATININE 1.27*  CALCIUM 10.0  PROT 7.1  BILITOT 0.3  ALKPHOS 192*  ALT 175*  AST 255*  GLUCOSE 121*     Radiology/Studies  Dg Chest Port 1 View  05/02/2013   CLINICAL DATA:  Cough and shortness of breath. Wheezing.  EXAM: PORTABLE CHEST - 1 VIEW  COMPARISON:  12/14/2006.  FINDINGS: Chronic cardiomegaly. Status post median sternotomy. New, diffuse interstitial opacity. Haziness of the lower chest, likely small effusions. No pneumothorax. Chronic pulmonary hyperinflation.  IMPRESSION: 1. CHF with trace pleural effusions. 2. COPD.     ECG HR 112 Sinus tach with PACs marked ST abnormalities, possible lateral sub endocardial injury   ASSESSMENT AND PLAN Alexis Dillon is a 67 y.o. 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 She was admitted last night around 7 pm with worsening productive cough and dyspnea. She was found to have an elevated troponin and abnormal EKG with ST changes and was transferred to [facility].  Acute COPD exacerbation- acutely short of breath upon arrival. 02 sats 80s on RA. Now 97% on 2 L  02 -- 50 pack year history of smoking -- On home Proventil and Atrovent HFA -- p02 55.8, Bicarb 24.4 -- CXR with marked hyperinflation  -- Internal medicine to consult   CHF - BNP 11,638, cr. 1.27, CXR showing CHF with trace pleural effusions -- Pending ECHO -- 80 mg IV Lasix last night with ~1.5L urine output, w/ foley, 4lb weight loss -- Strict I/Os, daily weights and restricted salt and fluids -- Hold Lasix today given elevated creatinine, will start 40 mg po tomorrow -- Monitor BMETs  Possible ACS -- Troponin .39 and EKG with ST changes. Patient denies current chest pain.  -- Strong family history of heart disease. Father died of MI and age 53.  All of her brothers and sisters have had coronary events as well. All smokers.  -- Will probably need cardiac cath on this admission; however suspected COPD exacerbation and heart failure will need to be addressed first.  -- Will hold on heparin gtt unless troponin continue to rise or patient with active CP. 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  Agree with assessment and plan as noted above.  Changes made to the above note as needed.  Complex patient with severe COPD ( hyperinflation in CXR), mod-severe cardiac enlargement  Who presents with COPD exacerbation , abnormal ECG and very mildly elevated Troponin levels.   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  I doubt that this is an acute plaque rupture.  She likely does have CAD with her long hx of smoking and her strong family hx of CAD.   Agree with getting serial troponin levels.     Her CXR at [facility] shows some CHF but she has already received IV lasix and has diuresed 1.5 liters.  Her creatinine has already bumped slightly this am and we will hold any further diuretics today   I have discussed with Dr. Deno Etienne.  He feels that she needs some gentle hydration because of her lactic acidosis.  I would agree with gentle hydration today.    Will transfer her to [facility] hospitalist today.   We will continue on as consultants.   Vesta Mixer, Montez Hageman., MD, [facility] 05/02/2013, 11:18 AM

## 2013-05-02 NOTE — Progress Notes (Signed)
Attempting teaching on heart Failure with pt. She says no one ever told her she had heart failure , never on Lasix. Went over her echo results with her and showed heart Failure video. Pt still smokes and asking for po fluids frequently. Tried to talk to pt about fluid limits and lowering salt intake. Pt not receptive at this time. Left information at bedside. Pt is afriad that everyone will be telling her what to do. Reminded pt that she can manage condition but should be aware of things she can do to feel better.

## 2013-05-02 NOTE — ED Notes (Signed)
Multiple IV punctures due to blood cultures, blood hemolysed from initial collection had to be redrawn, very poor options for IV sites.

## 2013-05-02 NOTE — Evaluation (Signed)
Physical Therapy Evaluation Patient Details Name: Alexis Dillon MRN: [id number] DOB: Dec 27, 1945 Today's Date: 05/02/2013 Time: 4782-9562 PT Time Calculation (min): 23 min  PT Assessment / Plan / Recommendation History of Present Illness  Alexis Dillon is a 67 y.o. 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 She was admitted last night around 7 pm with worsening productive cough and dyspnea. She was found to have an elevated troponin and abnormal EKG with ST changes and was transferred to [facility].   Clinical Impression  Patient presents with problems listed below.  Will benefit from acute PT to maximize independence prior to discharge.  Recommend HHPT for continued therapy at discharge.    PT Assessment  Patient needs continued PT services    Follow Up Recommendations  Home health PT;Supervision/Assistance - 24 hour    Does the patient have the potential to tolerate intense rehabilitation      Barriers to Discharge        Equipment Recommendations  None recommended by PT    Recommendations for Other Services     Frequency Min 3X/week    Precautions / Restrictions Precautions Precautions: Fall Restrictions Weight Bearing Restrictions: No   Pertinent Vitals/Pain       Mobility  Bed Mobility Bed Mobility: Supine to Sit;Sit to Supine Supine to Sit: 5: Supervision;With rails;HOB elevated Sit to Supine: 5: Supervision;With rail;HOB elevated Details for Bed Mobility Assistance: Supervision for safety only.  Once sitting, patient with good static and dynamic sitting balance. Transfers Transfers: Not assessed (Patient declined)    Exercises     PT Diagnosis: Difficulty walking;Generalized weakness;Altered mental status  PT Problem List: Decreased strength;Decreased activity tolerance;Decreased  balance;Decreased mobility;Decreased knowledge of use of DME;Decreased safety awareness;Cardiopulmonary status limiting activity PT Treatment Interventions: DME instruction;Gait training;Functional mobility training;Balance training;Patient/family education     PT Goals(Current goals can be found in the care plan section) Acute Rehab PT Goals Patient Stated Goal: To get stronger PT Goal Formulation: With patient Time For Goal Achievement: 05/09/13 Potential to Achieve Goals: Good  Visit Information  Last PT Received On: 05/02/13 Assistance Needed: +1 History of Present Illness: Alexis Dillon is a 67 y.o. 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 She was admitted last night around 7 pm with worsening productive cough and dyspnea. She was found to have an elevated troponin and abnormal EKG with ST changes and was transferred to [facility].        Prior Functioning  Home Living Family/patient expects to be discharged to:: Private residence Living Arrangements: Other relatives (sister and 2 nephews) Available Help at Discharge: Family;Available 24 hours/day Type of Home: Mobile home Home Access: Ramped entrance Home Layout: One level Home Equipment: Electric scooter;Cane - single point;Walker - 2 wheels Prior Function Level of Independence: Independent with assistive device(s);Needs assistance Gait / Transfers Assistance Needed: Uses cane ADL's / Homemaking Assistance Needed: Assist for meals and housekeeping Communication Communication: No difficulties    Cognition  Cognition Arousal/Alertness: Awake/alert Behavior During Therapy: Anxious Overall Cognitive Status: History of cognitive impairments - at baseline    Extremity/Trunk Assessment Upper Extremity Assessment Upper Extremity Assessment: Overall WFL for  tasks assessed  Lower Extremity Assessment Lower Extremity Assessment: Generalized weakness Cervical / Trunk Assessment Cervical / Trunk Assessment: Normal   Balance Balance Balance Assessed: Yes Static Sitting Balance Static Sitting - Balance Support: No upper extremity supported;Feet supported Static Sitting - Level of Assistance: 5: Stand by assistance Static Sitting - Comment/# of Minutes: 10 Dynamic Sitting Balance Dynamic Sitting - Balance Support: No upper extremity supported;Feet supported Dynamic Sitting - Level of Assistance: 5: Stand by assistance Dynamic Sitting - Balance Activities: Lateral lean/weight shifting;Forward lean/weight shifting;Reaching for objects;Reaching across midline Dynamic Sitting - Comments: Good balance with activities in sitting  End of Session PT - End of Session Equipment Utilized During Treatment: Oxygen Activity Tolerance: Patient limited by fatigue Patient left: in bed;with call bell/phone within reach Nurse Communication: Mobility status  GP     Vena Austria 05/02/2013, 4:27 PM Durenda Hurt. Renaldo Fiddler, [facility] Acute Rehab Services Pager [phone]

## 2013-05-02 NOTE — ED Notes (Signed)
Called RT, pt coughing a spitting up white sputum.

## 2013-05-02 NOTE — Progress Notes (Signed)
PA call back will place orders. Made aware that pt is not having any chest pain. VS stable

## 2013-05-02 NOTE — ED Notes (Signed)
Foley catheter draining clear urine.   Patient watching TV without complaints

## 2013-05-02 NOTE — ED Notes (Signed)
Family at bedside. Catheter placed due to patient being incontinent and to monitor output. Patient had two incontinent episodes, had to be cleaned at bedside. Urine returned with no issues. Output 400cc.

## 2013-05-02 NOTE — ED Notes (Addendum)
Frequently coughing up large amounts of clear and white phlegm - states she had not been able to get anything up before when she coughed.  Speaking in full sentences, laughing and making jokes at intervals.  Constantly talking.

## 2013-05-02 NOTE — Progress Notes (Signed)
Utilization Review Completed Zoua Caporaso J. Obe Ahlers, RN, BSN, NCM [phone]  

## 2013-05-02 NOTE — Plan of Care (Signed)
Problem: Phase I Progression Outcomes Goal: EF % per last Echo/documented,Core Reminder form on chart Outcome: Completed/Met Date Met:  05/02/13 05/02/13 Echo done EF 20 to 25 %

## 2013-05-02 NOTE — ED Notes (Signed)
Patient placed on continuous cardiac monitoring, continuous pulse 0x monitoring, and placed patient of 2L O2 [address].  

## 2013-05-02 NOTE — Progress Notes (Signed)
ANTIBIOTIC CONSULT NOTE - INITIAL  Pharmacy Consult for Rocephin Indication: UTI  No Known Allergies  Patient Measurements: Height: 5\' 5"  (165.1 cm) Weight: 97 lb 14.2 oz (44.4 kg) (bed scal) IBW/kg (Calculated) : 57  Vital Signs: Temp: 98 F (36.7 C) (12/17 0709) Temp src: Oral (12/17 0709) BP: 93/77 mmHg (12/17 0709) Pulse Rate: 80 (12/17 0709) Intake/Output from previous day: 12/16 0701 - 12/17 0700 In: -  Out: 852 [Urine:852] Intake/Output from this shift: Total I/O In: 720 [P.O.:720] Out: 1550 [Urine:1550]  Labs:  Recent Labs  05/02/13 0236 05/02/13 1230  WBC 15.7* 13.1*  HGB 13.8 13.0  PLT 228 211  CREATININE 1.27*  --    Estimated Creatinine Clearance: 29.7 ml/min (by C-G formula based on Cr of 1.27). No results found for this basename: VANCOTROUGH, Leodis Binet, VANCORANDOM, GENTTROUGH, GENTPEAK, GENTRANDOM, TOBRATROUGH, TOBRAPEAK, TOBRARND, AMIKACINPEAK, AMIKACINTROU, AMIKACIN,  in the last 72 hours   Microbiology: Recent Results (from the past 720 hour(s))  CULTURE, BLOOD (ROUTINE X 2)     Status: None   Collection Time    05/02/13  3:02 AM      Result Value Range Status   Specimen Description BLOOD RIGHT ANTECUBITAL   Final   Special Requests     Final   Value: BOTTLES DRAWN AEROBIC AND ANAEROBIC AEB 4CC ANA 2CC   Culture NO GROWTH <24 HRS   Final   Report Status PENDING   Incomplete  CULTURE, BLOOD (ROUTINE X 2)     Status: None   Collection Time    05/02/13  3:02 AM      Result Value Range Status   Specimen Description BLOOD RIGHT ANTECUBITAL   Final   Special Requests BOTTLES DRAWN AEROBIC ONLY 4CC   Final   Culture NO GROWTH <24 HRS   Final   Report Status PENDING   Incomplete    Medical History: Past Medical History  Diagnosis Date  . COPD (chronic obstructive pulmonary disease)   . Tobacco abuse   . Hypertension   . History of atrial septal defect     Closure in 50's   . Mental retardation, idiopathic mild   . Arthritis      Medications:  Prescriptions prior to admission  Medication Sig Dispense Refill  . albuterol (PROVENTIL) (2.5 MG/3ML) 0.083% nebulizer solution Take 2.5 mg by nebulization every 6 (six) hours as needed for wheezing or shortness of breath.      Marland Kitchen ipratropium (ATROVENT HFA) 17 MCG/ACT inhaler Inhale 1 puff into the lungs every 6 (six) hours as needed for wheezing.      . pantoprazole (PROTONIX) 40 MG tablet Take 40 mg by mouth daily.      . trandolapril (MAVIK) 4 MG tablet Take 4 mg by mouth daily.       Assessment: 67 y.o. female transferred from [facility] ED with complaints of SOB. To begin Rocephin for UTI. Bld and urine cx pending. Afeb. Wbc slightly elevated. UA negative for nitrites, small leukocytes.  Goal of Therapy:  Eradication of infection  Plan:  1. Rocephin 1gm IV q24h. 2. Pharmacy will sign off and follow as unit-based patient - please reconsult if needed.  Christoper Fabian, PharmD, BCPS Clinical pharmacist, pager [phone] 05/02/2013,1:17 PM

## 2013-05-02 NOTE — Care Management Note (Signed)
    Page 1 of 2   05/02/2013     2:31:28 PM   CARE MANAGEMENT NOTE 05/02/2013  Patient:  Alexis Dillon, Alexis Dillon   Account Number:  [id number]  Date Initiated:  05/02/2013  Documentation initiated by:  [facility]  Subjective/Objective Assessment:   67 y.o. female with a history of mild mental retardation, COPD not on home 02, current tobacco abuse (50 pack years), hypertension, and ASD.She was found to have an elevated troponin and abnormal EKG with ST changes and w //Home with family     Action/Plan:   Strict I/Os, daily weights and restricted salt and fluids; IV lasix//Home with HH   Anticipated DC Date:  05/05/2013   Anticipated DC Plan:  HOME W HOME HEALTH SERVICES      DC Planning Services  CM consult      [facility] Choice  HOME HEALTH   Choice offered to / List presented to:  C-2 HC POA / Guardian        HH arranged  HH-1 RN  HH-10 DISEASE MANAGEMENT  HH-6 SOCIAL WORKER      HH agency  [facility]   Status of service:  Completed, signed off Medicare Important Message given?   (If response is "NO", the following Medicare IM given date fields will be blank) Date Medicare IM given:   Date Additional Medicare IM given:    Discharge Disposition:    Per UR Regulation:  Reviewed for med. necessity/level of care/duration of stay  If discussed at Long Length of Stay Meetings, dates discussed:    Comments:  05/02/13 1415 Oletta Cohn, RN, BSN, [employer] [phone] Spoke with pt. at bedside regarding discharge planning. CM offered pt. and sister-in-law (pt unable to read and write) list of home health agencies. Pt. chose [facility] to render services. Kizzie Furnish of [facility] notified. No DME needs identified at this time.

## 2013-05-03 DIAGNOSIS — I200 Unstable angina: Secondary | ICD-10-CM

## 2013-05-03 DIAGNOSIS — I249 Acute ischemic heart disease, unspecified: Secondary | ICD-10-CM | POA: Diagnosis present

## 2013-05-03 LAB — HEPARIN LEVEL (UNFRACTIONATED)
Heparin Unfractionated: 0.41 IU/mL (ref 0.30–0.70)
Heparin Unfractionated: 0.35 IU/mL (ref 0.30–0.70)

## 2013-05-03 LAB — URINE CULTURE

## 2013-05-03 LAB — BASIC METABOLIC PANEL
BUN: 30 mg/dL — ABNORMAL HIGH (ref 6–23)
CO2: 25 mEq/L (ref 19–32)
Chloride: 96 mEq/L (ref 96–112)
GFR calc Af Amer: 43 mL/min — ABNORMAL LOW (ref >90)
Potassium: 3.5 mEq/L (ref 3.5–5.1)

## 2013-05-03 LAB — CBC
Hemoglobin: 11.6 g/dL — ABNORMAL LOW (ref 12.0–15.0)
Platelets: 186 10*3/uL (ref 150–400)
RBC: 3.71 MIL/uL — ABNORMAL LOW (ref 3.87–5.11)
WBC: 12.9 10*3/uL — ABNORMAL HIGH (ref 4.0–10.5)

## 2013-05-03 LAB — TROPONIN I
Troponin I: 0.36 ng/mL (ref <0.30)
Troponin I: <0.3 ng/mL (ref <0.30)

## 2013-05-03 LAB — GLUCOSE, CAPILLARY: Glucose-Capillary: 162 mg/dL — ABNORMAL HIGH (ref 70–99)

## 2013-05-03 MED ORDER — ALBUTEROL SULFATE (5 MG/ML) 0.5% IN NEBU
2.5000 mg | INHALATION_SOLUTION | Freq: Three times a day (TID) | RESPIRATORY_TRACT | Status: DC
  Administered 2013-05-03 – 2013-05-05 (×9): 2.5 mg via RESPIRATORY_TRACT
  Filled 2013-05-03 (×12): qty 0.5

## 2013-05-03 MED ORDER — SODIUM CHLORIDE 0.9 % IJ SOLN
3.0000 mL | Freq: Two times a day (BID) | INTRAMUSCULAR | Status: DC
  Administered 2013-05-03: 12:00:00 3 mL via INTRAVENOUS

## 2013-05-03 MED ORDER — ASPIRIN 81 MG PO CHEW
81.0000 mg | CHEWABLE_TABLET | ORAL | Status: AC
  Administered 2013-05-04: 81 mg via ORAL
  Filled 2013-05-03: qty 1

## 2013-05-03 MED ORDER — IPRATROPIUM BROMIDE 0.02 % IN SOLN: 0.5000 mg | Freq: Three times a day (TID) | RESPIRATORY_TRACT | Status: DC

## 2013-05-03 MED ORDER — GUAIFENESIN ER 600 MG PO TB12
600.0000 mg | ORAL_TABLET | Freq: Two times a day (BID) | ORAL | Status: DC
  Administered 2013-05-03 – 2013-05-11 (×15): 600 mg via ORAL
  Filled 2013-05-03 (×18): qty 1

## 2013-05-03 MED ORDER — SODIUM CHLORIDE 0.9 % IJ SOLN: 3.0000 mL | INTRAMUSCULAR | Status: DC | PRN

## 2013-05-03 MED ORDER — IPRATROPIUM BROMIDE 0.02 % IN SOLN
0.5000 mg | Freq: Three times a day (TID) | RESPIRATORY_TRACT | Status: DC
  Administered 2013-05-03 – 2013-05-05 (×9): 0.5 mg via RESPIRATORY_TRACT
  Filled 2013-05-03 (×9): qty 2.5

## 2013-05-03 MED ORDER — ALBUTEROL SULFATE (5 MG/ML) 0.5% IN NEBU: 2.5000 mg | INHALATION_SOLUTION | RESPIRATORY_TRACT | Status: DC

## 2013-05-03 MED ORDER — SODIUM CHLORIDE 0.9 % IV SOLN: 250.0000 mL | INTRAVENOUS | Status: DC | PRN

## 2013-05-03 NOTE — Progress Notes (Signed)
ANTICOAGULATION CONSULT NOTE Pharmacy Consult for heparin Indication: chest pain/ACS  No Known Allergies  Patient Measurements: Height: 5\' 5"  (165.1 cm) Weight: 93 lb 7.6 oz (42.4 kg) IBW/kg (Calculated) : 57 Heparin Dosing Weight: 44.4 kg  Vital Signs: Temp: 98 F (36.7 C) (12/18 0437) Temp src: Oral (12/18 0437) BP: 108/49 mmHg (12/18 0437) Pulse Rate: 89 (12/18 0456)  Labs:  Recent Labs  05/02/13 0236 05/02/13 0258 05/02/13 1230 05/02/13 1730 05/02/13 2237 05/03/13 0346  HGB 13.8  --  13.0  --   --  11.6*  HCT 42.4  --  38.4  --   --  34.2*  PLT 228  --  211  --   --  186  HEPARINUNFRC  --   --   --   --   --  0.41  CREATININE 1.27*  --  1.22*  --   --  1.41*  TROPONINI  --  0.39*  --  1.43* 0.63*  --     Estimated Creatinine Clearance: 25.6 ml/min (by C-G formula based on Cr of 1.41).  Assessment: 67 yo female with chest pain, elevated cardiac markers, for heparin  Goal of Therapy:  Heparin level 0.3-0.7 units/ml Monitor platelets by anticoagulation protocol: Yes   Plan:  Continue Heparin at current rate  Luana Tatro, Gary Fleet 05/03/2013,5:46 AM

## 2013-05-03 NOTE — Progress Notes (Signed)
Seen and agree with SPT note Cailee Blanke Tabor Helton Oleson, PT [phone]  

## 2013-05-03 NOTE — Progress Notes (Signed)
[facility] PROGRESS NOTE   Alexis Dillon ZOX:[id number] DOB: 1946-01-21 DOA: 05/02/2013 PCP: Fredirick Maudlin, MD  Brief narrative: Alexis Dillon is an 68 y.o. female with a PMH of mild mental retardation, COPD, current tobacco abuse, hypertension, atrial septal defect with closure late in life, was transferred from [facility] for evaluation of ongoing shortness of breath after she was discovered to have elevated troponins and an abnormal EKG with ST changes. She has been evaluated by cardiology.  Assessment/Plan: Principal Problem:   Acute coronary syndrome The patient was admitted and evaluated by cardiology. She is currently being medically managed with IV heparin and a nitroglycerin drip, with plans to pursue cardiac catheterization tomorrow morning. Continue aspirin therapy. She is also being managed with low-dose ACE inhibitor and diuretic therapy. Beta blocker currently contraindicated in the setting of an acute COPD exacerbation. Troponin reached a maximum value of 1.43, and now is normal this morning. Active Problems:   HIGH BLOOD PRESSURE / hypertension Blood pressure 108/49 today.   Acute on chronic systolic CHF (congestive heart failure) Status post 2-D echo on 05/02/2013. EF 20-25% with evidence of severe left ventricular and right ventricular failure. Continue medical therapy with ACE inhibitor and diuretics. Weight 42.4 kg, down from 45.8 kg.   COPD (chronic obstructive pulmonary disease) Continue duo nebs, IV steroids, supplemental oxygen as needed.   Tobacco abuse Counseled on cessation. Poor insight.   History of atrial septal defect Cardiology following.   Mental retardation, idiopathic mild Nursing staff providing supportive care.   Acute respiratory failure Multifactorial with COPD exacerbation, acute on chronic systolic contributory. See above.  Code Status: Full. Family Communication: No family at bedside. Disposition Plan: Home when stable.   IV  access:  Peripheral IV  Medical Consultants:  Dr. Charlton Haws, Cardiology.  Other Consultants:  None.  Anti-infectives:  None.   HPI/Subjective: Alexis Dillon denies chest pain and states her breathing is much better. No complaints of nausea or vomiting. Continues to have a cough with thick secretions.  Objective: Filed Vitals:   05/03/13 0444 05/03/13 0456 05/03/13 0920 05/03/13 1129  BP:      Pulse: 88 89    Temp:      TempSrc:      Resp:      Height:      Weight:    42.4 kg (93 lb 7.6 oz)  SpO2:   98%     Intake/Output Summary (Last 24 hours) at 05/03/13 1302 Last data filed at 05/03/13 1211  Gross per 24 hour  Intake 1240.12 ml  Output   1180 ml  Net  60.12 ml    Exam: Gen:  NAD Cardiovascular:  RRR, No M/R/G Respiratory:  Lungs coarse with an occasional high-pitched wheeze Gastrointestinal:  Abdomen soft, NT/ND, + BS Extremities:  No C/E/C  Data Reviewed: Basic Metabolic Panel:  Recent Labs Lab 05/02/13 0236 05/02/13 1230 05/03/13 0346  NA 137  --  133*  K 3.8  --  3.5  CL 98  --  96  CO2 25  --  25  GLUCOSE 121*  --  192*  BUN 23  --  30*  CREATININE 1.27* 1.22* 1.41*  CALCIUM 10.0  --  8.6   GFR Estimated Creatinine Clearance: 25.6 ml/min (by C-G formula based on Cr of 1.41). Liver Function Tests:  Recent Labs Lab 05/02/13 0236  AST 255*  ALT 175*  ALKPHOS 192*  BILITOT 0.3  PROT 7.1  ALBUMIN 4.0   CBC:  Recent Labs Lab 05/02/13  0236 05/02/13 1230 05/03/13 0346  WBC 15.7* 13.1* 12.9*  NEUTROABS 13.3*  --   --   HGB 13.8 13.0 11.6*  HCT 42.4 38.4 34.2*  MCV 95.9 93.0 92.2  PLT 228 211 186   Cardiac Enzymes:  Recent Labs Lab 05/02/13 0258 05/02/13 1730 05/02/13 2237 05/03/13 0435 05/03/13 1055  TROPONINI 0.39* 1.43* 0.63* 0.36* <0.30   BNP (last 3 results)  Recent Labs  05/02/13 0236  PROBNP 11638.0*   Thyroid function studies  Recent Labs  05/02/13 1230  TSH 1.577   Microbiology Recent Results  (from the past 240 hour(s))  CULTURE, BLOOD (ROUTINE X 2)     Status: None   Collection Time    05/02/13  3:02 AM      Result Value Range Status   Specimen Description BLOOD RIGHT ANTECUBITAL   Final   Special Requests     Final   Value: BOTTLES DRAWN AEROBIC AND ANAEROBIC AEB 4CC ANA 2CC   Culture NO GROWTH <24 HRS   Final   Report Status PENDING   Incomplete  CULTURE, BLOOD (ROUTINE X 2)     Status: None   Collection Time    05/02/13  3:02 AM      Result Value Range Status   Specimen Description BLOOD RIGHT ANTECUBITAL   Final   Special Requests BOTTLES DRAWN AEROBIC ONLY 4CC   Final   Culture NO GROWTH <24 HRS   Final   Report Status PENDING   Incomplete  URINE CULTURE     Status: None   Collection Time    05/02/13  4:44 AM      Result Value Range Status   Specimen Description URINE, CLEAN CATCH   Final   Special Requests NONE   Final   Culture  Setup Time     Final   Value: 05/02/2013 10:41     Performed at [facility] Count     Final   Value: NO GROWTH     Performed at [facility]   Culture     Final   Value: NO GROWTH     Performed at [facility]   Report Status 05/03/2013 FINAL   Final     Procedures and Diagnostic Studies: Dg Chest Port 1 View  05/02/2013   CLINICAL DATA:  Cough and shortness of breath. Wheezing.  EXAM: PORTABLE CHEST - 1 VIEW  COMPARISON:  12/14/2006.  FINDINGS: Chronic cardiomegaly. Status post median sternotomy. New, diffuse interstitial opacity. Haziness of the lower chest, likely small effusions. No pneumothorax. Chronic pulmonary hyperinflation.  IMPRESSION: 1. CHF with trace pleural effusions. 2. COPD.   Electronically Signed   By: Tiburcio Pea M.D.   On: 05/02/2013 03:01   Mm Digital Screening  05/02/2013   CLINICAL DATA:  Screening.  EXAM: DIGITAL SCREENING BILATERAL MAMMOGRAM WITH CAD  COMPARISON:  Previous exam(s).  ACR Breast Density Category b: There are scattered areas of fibroglandular density.   FINDINGS: There are no findings suspicious for malignancy. Images were processed with CAD.  IMPRESSION: No mammographic evidence of malignancy. A result letter of this screening mammogram will be mailed directly to the patient.  RECOMMENDATION: Screening mammogram in one year. (Code:[id number]-B-01Y)  BI-RADS CATEGORY  1: Negative   Electronically Signed   By: Christiana Pellant M.D.   On: 05/02/2013 13:15    Scheduled Meds: . albuterol  2.5 mg Nebulization TID   And  . ipratropium  0.5 mg Nebulization TID  . [  START ON 05/04/2013] aspirin  81 mg Oral Pre-Cath  . aspirin EC  81 mg Oral Daily  . atorvastatin  80 mg Oral q1800  . cefTRIAXone (ROCEPHIN)  IV  1 g Intravenous Q24H  . furosemide  40 mg Oral Daily  . methylPREDNISolone (SOLU-MEDROL) injection  60 mg Intravenous Q6H  . pantoprazole  40 mg Oral Daily  . sodium chloride  3 mL Intravenous Q12H  . sodium chloride  3 mL Intravenous Q12H  . trandolapril  4 mg Oral Daily   Continuous Infusions: . sodium chloride 10 mL/hr (05/02/13 1820)  . heparin 650 Units/hr (05/02/13 2053)  . nitroGLYCERIN 5 mcg/min (05/02/13 1000)    Time spent: 35 minutes with > 50% of time discussing current diagnostic test results, clinical impression and plan of care.    LOS: 1 day   RAMA,CHRISTINA  [facility] Pager [phone].   *Please note that the hospitalists switch teams on Wednesdays. Please call the flow manager at [phone] if you are having difficulty reaching the hospitalist taking care of this patient as she can update you and provide the most up-to-date pager number of provider caring for the patient. If 8PM-8AM, please contact night-coverage at [email or web address], password [facility]  05/03/2013, 1:02 PM

## 2013-05-03 NOTE — Progress Notes (Signed)
Patient ID: Alexis Dillon, female   DOB: 07/13/1945, 67 y.o.   MRN: [id number]    Subjective:  Leg cramps and coughing   Objective:  Filed Vitals:   05/03/13 0139 05/03/13 0437 05/03/13 0444 05/03/13 0456  BP:  108/49    Pulse: 94 111 88 89  Temp:  98 F (36.7 C)    TempSrc:  Oral    Resp:  18    Height:      Weight:  93 lb 7.6 oz (42.4 kg)    SpO2:  94%      Intake/Output from previous day:  Intake/Output Summary (Last 24 hours) at 05/03/13 0825 Last data filed at 05/03/13 0618  Gross per 24 hour  Intake 1480.12 ml  Output   2475 ml  Net -994.88 ml    Physical Exam: Affect appropriate Frail thin female  HEENT: normal Neck supple with no adenopathy JVP elevated no bruits no thyromegaly Lungs clear with no wheezing and good diaphragmatic motion Heart:  S1/S2 no murmur, no rub, gallop or click PMI enlarged  With RV heave  Abdomen: benighn, BS positve, no tenderness, no AAA no bruit.  No HSM or HJR Distal pulses intact with no bruits No edema Neuro non-focal Skin warm and dry multiple echymosis  No muscular weakness   Lab Results: Basic Metabolic Panel:  Recent Labs  05/02/13 0236 05/02/13 1230 05/03/13 0346  NA 137  --  133*  K 3.8  --  3.5  CL 98  --  96  CO2 25  --  25  GLUCOSE 121*  --  192*  BUN 23  --  30*  CREATININE 1.27* 1.22* 1.41*  CALCIUM 10.0  --  8.6   Liver Function Tests:  Recent Labs  05/02/13 0236  AST 255*  ALT 175*  ALKPHOS 192*  BILITOT 0.3  PROT 7.1  ALBUMIN 4.0   CBC:  Recent Labs  05/02/13 0236 05/02/13 1230 05/03/13 0346  WBC 15.7* 13.1* 12.9*  NEUTROABS 13.3*  --   --   HGB 13.8 13.0 11.6*  HCT 42.4 38.4 34.2*  MCV 95.9 93.0 92.2  PLT 228 211 186   Cardiac Enzymes:  Recent Labs  05/02/13 1730 05/02/13 2237 05/03/13 0435  TROPONINI 1.43* 0.63* 0.36*   Thyroid Function Tests:  Recent Labs  05/02/13 1230  TSH 1.577    Imaging: Dg Chest Port 1 View  05/02/2013   CLINICAL DATA:  Cough and  shortness of breath. Wheezing.  EXAM: PORTABLE CHEST - 1 VIEW  COMPARISON:  12/14/2006.  FINDINGS: Chronic cardiomegaly. Status post median sternotomy. New, diffuse interstitial opacity. Haziness of the lower chest, likely small effusions. No pneumothorax. Chronic pulmonary hyperinflation.  IMPRESSION: 1. CHF with trace pleural effusions. 2. COPD.   Electronically Signed   By: Jonathan  Watts M.D.   On: 05/02/2013 03:01    Cardiac Studies:  ECG:  Orders placed during the hospital encounter of 05/02/13  . EKG     Telemetry:  NSR no arrhythmia 05/03/2013   Echo:  EF 20-25%  Severe RV/LV failure with mild MR  Medications:   . albuterol  2.5 mg Nebulization TID   And  . ipratropium  0.5 mg Nebulization TID  . aspirin EC  81 mg Oral Daily  . atorvastatin  80 mg Oral q1800  . cefTRIAXone (ROCEPHIN)  IV  1 g Intravenous Q24H  . furosemide  40 mg Oral Daily  . methylPREDNISolone (SOLU-MEDROL) injection  60 mg Intravenous Q6H  . pantoprazole    40 mg Oral Daily  . sodium chloride  3 mL Intravenous Q12H  . trandolapril  4 mg Oral Daily     . sodium chloride 10 mL/hr (05/02/13 1820)  . heparin 650 Units/hr (05/02/13 2053)  . nitroGLYCERIN 5 mcg/min (05/02/13 1000)    Assessment/Plan:  CHF:  Improving on low dose ACE and diuretic.  Beta blocker not indicated in setting of acute COPD exacerbation She Would appear stable enough for right and left heart cath in am.  Orders written and put on board.  Overall prognosis  Seems poor given the severity of her lung and heart disease.   Respitory Failure:  Continue inhalers and prednisone  Plan per primary service May need home oxygen arranged  Discussed Smoking cessation with her but she has little insight  CXR with mild CHF and COPD but no acute infection   Alexis Dillon 05/03/2013, 8:25 AM     

## 2013-05-03 NOTE — Progress Notes (Signed)
ANTICOAGULATION CONSULT NOTE  Pharmacy Consult for heparin Indication: chest pain/ACS  No Known Allergies  Patient Measurements: Height: 5\' 5"  (165.1 cm) Weight: 93 lb 7.6 oz (42.4 kg) IBW/kg (Calculated) : 57  Vital Signs: Temp: 98 F (36.7 C) (12/18 0437) Temp src: Oral (12/18 0437) BP: 108/49 mmHg (12/18 0437) Pulse Rate: 89 (12/18 0456)  Labs:  Recent Labs  05/02/13 0236  05/02/13 1230  05/02/13 2237 05/03/13 0346 05/03/13 0435 05/03/13 1055 05/03/13 1207  HGB 13.8  --  13.0  --   --  11.6*  --   --   --   HCT 42.4  --  38.4  --   --  34.2*  --   --   --   PLT 228  --  211  --   --  186  --   --   --   HEPARINUNFRC  --   --   --   --   --  0.41  --   --  0.35  CREATININE 1.27*  --  1.22*  --   --  1.41*  --   --   --   TROPONINI  --   < >  --   < > 0.63*  --  0.36* <0.30  --   < > = values in this interval not displayed.  Estimated Creatinine Clearance: 25.6 ml/min (by C-G formula based on Cr of 1.41).  Assessment: 67 yo female on heparin for CP, elevated troponin. Heparin level 0.35 (therapeutic) on 650 units/hr. Hgb down but no bleeding noted. Plan for cath in a.m.  Goal of Therapy:  Heparin level 0.3-0.7 units/ml Monitor platelets by anticoagulation protocol: Yes   Plan:  Continue Heparin at 650 units/hr F/u daily CBC, heparin level  Christoper Fabian, PharmD, BCPS Clinical pharmacist, pager [phone] 05/03/2013,1:19 PM

## 2013-05-03 NOTE — Progress Notes (Signed)
Physical Therapy Treatment Patient Details Name: Alexis Dillon MRN: [id number] DOB: 09/27/1945 Today's Date: 05/03/2013 Time: 7829-5621 PT Time Calculation (min): 29 min  PT Assessment / Plan / Recommendation  History of Present Illness Alexis Dillon is a 67 y.o. female with a history of mild mental retardation, COPD not on home 02, current tobacco abuse (50 pack years), hypertension, and ASD with closure late in life who was transferred from [facility] for SOB x 3 days. She was admitted last night around 7 pm with worsening productive cough and dyspnea. She was found to have an elevated troponin and abnormal EKG with ST changes and was transferred to [facility].    PT Comments   Patient pleasant and willing to work with PT for therapy today. Is mobile and needed instruction for safe placement of her [facility] and assistance/instructions to allow for safe placement of catheter and management of IV pole prior to ambulation. Pt reports increasing fatigue in R knee during WB activity. She can benefit from continued PT services acutely in order to increase functional mobility and decrease caregiver burden.   Follow Up Recommendations  Home health PT;Supervision/Assistance - 24 hour     Does the patient have the potential to tolerate intense rehabilitation     Barriers to Discharge        Equipment Recommendations  None recommended by PT    Recommendations for Other Services    Frequency Min 3X/week   Progress towards PT Goals Progress towards PT goals: Goals met and updated - see care plan  Plan Current plan remains appropriate    Precautions / Restrictions Precautions Precautions: Fall Restrictions Weight Bearing Restrictions: No   Pertinent Vitals/Pain SpO2 94-98% on RA at rest and with ambulation.     Mobility  Bed Mobility Bed Mobility: Supine to Sit Supine to Sit: 5: Supervision;With rails;HOB elevated Details for Bed Mobility Assistance: supervision for line management and  safety Transfers Transfers: Sit to Stand;Stand to Sit Sit to Stand: 4: Min guard;With upper extremity assist;From bed Stand to Sit: 4: Min guard;With upper extremity assist;To chair/3-in-1 Details for Transfer Assistance: cues for safe placement of SPC, and for line management  Ambulation/Gait Ambulation/Gait Assistance: 4: Min guard Ambulation Distance (Feet): 130 Feet Assistive device: Straight cane Ambulation/Gait Assistance Details: guard for safety, line management, cues for proper use of cane Gait Pattern: Step-through pattern (increased weight shift to right following report R knee pain) Gait velocity: decreased at end due to fatigue/pain in R knee General Gait Details: Pt needing minor cueing for use of SPC in order to effectively offload R LE; suggested use of RW with increased knee pain in order to support bilat UE as needed; Stairs: No    Exercises General Exercises - Lower Extremity Long Arc Quad: AROM;Both;20 reps;Seated Hip ABduction/ADduction: AROM;Both;20 reps;Seated   PT Diagnosis:    PT Problem List:   PT Treatment Interventions:     PT Goals (current goals can now be found in the care plan section) Acute Rehab PT Goals Patient Stated Goal: To get stronger  Visit Information  Last PT Received On: 05/03/13 Assistance Needed: +1 History of Present Illness: Alexis Dillon is a 67 y.o. female with a history of mild mental retardation, COPD not on home 02, current tobacco abuse (50 pack years), hypertension, and ASD with closure late in life who was transferred from [facility] for SOB x 3 days. She was admitted last night around 7 pm with worsening productive cough and dyspnea. She was found to  have an elevated troponin and abnormal EKG with ST changes and was transferred to [facility].     Subjective Data  Patient Stated Goal: To get stronger   Cognition  Cognition Arousal/Alertness: Awake/alert Behavior During Therapy: WFL for tasks assessed/performed Overall  Cognitive Status: History of cognitive impairments - at baseline    Balance     End of Session PT - End of Session Activity Tolerance: Patient limited by fatigue Patient left: in chair;with call bell/phone within reach Nurse Communication: Mobility status   GP     Willette Pa, SPT 05/03/2013, 2:40 PM

## 2013-05-04 ENCOUNTER — Encounter (HOSPITAL_COMMUNITY)
Admission: EM | Disposition: A | Discharge status: Skilled Nursing Facility | Payer: Self-pay | Source: Home / Self Care | Attending: Internal Medicine

## 2013-05-04 DIAGNOSIS — I2510 Atherosclerotic heart disease of native coronary artery without angina pectoris: Secondary | ICD-10-CM

## 2013-05-04 DIAGNOSIS — I5021 Acute systolic (congestive) heart failure: Secondary | ICD-10-CM | POA: Diagnosis present

## 2013-05-04 HISTORY — PX: LEFT AND RIGHT HEART CATHETERIZATION WITH CORONARY ANGIOGRAM: SHX5449

## 2013-05-04 LAB — POCT I-STAT 3, ART BLOOD GAS (G3+)
O2 Saturation: 97 %
TCO2: 29 mmol/L (ref 0–100)
pCO2 arterial: 40.1 mmHg (ref 35.0–45.0)

## 2013-05-04 LAB — POCT I-STAT 3, VENOUS BLOOD GAS (G3P V)
O2 Saturation: 71 %
TCO2: 28 mmol/L (ref 0–100)
pH, Ven: 7.41 — ABNORMAL HIGH (ref 7.250–7.300)

## 2013-05-04 LAB — CBC
HCT: 38.5 % (ref 36.0–46.0)
Hemoglobin: 12.7 g/dL (ref 12.0–15.0)
MCH: 31.1 pg (ref 26.0–34.0)
MCHC: 33 g/dL (ref 30.0–36.0)
MCV: 94.1 fL (ref 78.0–100.0)
Platelets: 221 10*3/uL (ref 150–400)
RDW: 14.2 % (ref 11.5–15.5)
WBC: 19 10*3/uL — ABNORMAL HIGH (ref 4.0–10.5)

## 2013-05-04 LAB — BASIC METABOLIC PANEL
BUN: 34 mg/dL — ABNORMAL HIGH (ref 6–23)
CO2: 28 mEq/L (ref 19–32)
Calcium: 8.7 mg/dL (ref 8.4–10.5)
Chloride: 99 mEq/L (ref 96–112)
GFR calc Af Amer: 46 mL/min — ABNORMAL LOW (ref >90)
Sodium: 141 mEq/L (ref 135–145)

## 2013-05-04 LAB — TROPONIN I
Troponin I: 0.34 ng/mL (ref <0.30)
Troponin I: <0.3 ng/mL (ref <0.30)
Troponin I: 0.33 ng/mL (ref <0.30)

## 2013-05-04 LAB — PROTIME-INR: Prothrombin Time: 12.2 seconds (ref 11.6–15.2)

## 2013-05-04 LAB — HEPARIN LEVEL (UNFRACTIONATED): Heparin Unfractionated: 0.18 IU/mL — ABNORMAL LOW (ref 0.30–0.70)

## 2013-05-04 SURGERY — LEFT AND RIGHT HEART CATHETERIZATION WITH CORONARY ANGIOGRAM
Anesthesia: LOCAL

## 2013-05-04 MED ORDER — METHYLPREDNISOLONE SODIUM SUCC 125 MG IJ SOLR
60.0000 mg | Freq: Two times a day (BID) | INTRAMUSCULAR | Status: DC
  Administered 2013-05-05: 60 mg via INTRAVENOUS
  Filled 2013-05-04: qty 0.96
  Filled 2013-05-04: qty 2
  Filled 2013-05-04: qty 0.96

## 2013-05-04 MED ORDER — HEPARIN (PORCINE) IN NACL 2-0.9 UNIT/ML-% IJ SOLN
INTRAMUSCULAR | Status: AC
  Filled 2013-05-04: qty 1000

## 2013-05-04 MED ORDER — NITROGLYCERIN 0.2 MG/ML ON CALL CATH LAB
INTRAVENOUS | Status: AC
  Filled 2013-05-04: qty 1

## 2013-05-04 MED ORDER — ACETAMINOPHEN 325 MG PO TABS
650.0000 mg | ORAL_TABLET | ORAL | Status: DC | PRN
  Administered 2013-05-07 – 2013-05-11 (×2): 650 mg via ORAL
  Filled 2013-05-04 (×2): qty 2

## 2013-05-04 MED ORDER — ONDANSETRON HCL 4 MG/2ML IJ SOLN
4.0000 mg | Freq: Four times a day (QID) | INTRAMUSCULAR | Status: DC | PRN
  Administered 2013-05-05 – 2013-05-10 (×3): 4 mg via INTRAVENOUS
  Filled 2013-05-04 (×4): qty 2

## 2013-05-04 MED ORDER — HEPARIN (PORCINE) IN NACL 2-0.9 UNIT/ML-% IJ SOLN
INTRAMUSCULAR | Status: AC
  Filled 2013-05-04: qty 500

## 2013-05-04 MED ORDER — HEPARIN SODIUM (PORCINE) 5000 UNIT/ML IJ SOLN
5000.0000 [IU] | Freq: Three times a day (TID) | INTRAMUSCULAR | Status: DC
  Administered 2013-05-04 – 2013-05-05 (×3): 5000 [IU] via SUBCUTANEOUS
  Filled 2013-05-04 (×5): qty 1

## 2013-05-04 MED ORDER — LIDOCAINE HCL (PF) 1 % IJ SOLN
INTRAMUSCULAR | Status: AC
  Filled 2013-05-04: qty 30

## 2013-05-04 MED ORDER — MIDAZOLAM HCL 2 MG/2ML IJ SOLN
INTRAMUSCULAR | Status: AC
  Filled 2013-05-04: qty 2

## 2013-05-04 MED ORDER — SODIUM CHLORIDE 0.9 % IV SOLN
INTRAVENOUS | Status: DC
  Administered 2013-05-04: 50 mL/h via INTRAVENOUS

## 2013-05-04 MED ORDER — MORPHINE SULFATE 10 MG/ML IJ SOLN
INTRAMUSCULAR | Status: AC
  Filled 2013-05-04: qty 1

## 2013-05-04 NOTE — Progress Notes (Signed)
Pt here with ACS. We are following troponins with latest one being .34. Pt is having a cardiac cath in the am and is being followed by Cardiology. She is having NO active chest pain and is already on ASA, NTG gtt, and heparin gtt. Continue same plan.  Jimmye Norman, NP [facility]

## 2013-05-04 NOTE — Progress Notes (Signed)
Patient ID: Alexis Dillon, female   DOB: Dec 30, 1945, 67 y.o.   MRN: [id number]    Subjective:  Stable post cath.  No dyspnea or groin pain   Objective:  Filed Vitals:   05/04/13 1300 05/04/13 1330 05/04/13 1351 05/04/13 1400  BP: 154/68 125/72  107/51  Pulse: 85 89  93  Temp:      TempSrc:      Resp: 17 14  21   Height:      Weight:      SpO2: 94% 91% 95% 95%    Intake/Output from previous day:  Intake/Output Summary (Last 24 hours) at 05/04/13 1440 Last data filed at 05/04/13 1400  Gross per 24 hour  Intake   1306 ml  Output   2570 ml  Net  -1264 ml    Physical Exam: Affect appropriate Frail elderly female  HEENT: normal Neck supple with no adenopathy JVP normal no bruits no thyromegaly Lungs clear with no wheezing and good diaphragmatic motion Heart:  S1/S2 no murmur, no rub, gallop or click PMI normal Abdomen: benighn, BS positve, no tenderness, no AAA no bruit.  No HSM or HJR Distal pulses intact with no bruits No edema Neuro non-focal Skin warm and dry No muscular weakness Cath site ok no hematoma    Lab Results: Basic Metabolic Panel:  Recent Labs  40/98/11 0346 05/04/13 0520  NA 133* 141  K 3.5 3.8  CL 96 99  CO2 25 28  GLUCOSE 192* 157*  BUN 30* 34*  CREATININE 1.41* 1.34*  CALCIUM 8.6 8.7   Liver Function Tests:  Recent Labs  05/02/13 0236  AST 255*  ALT 175*  ALKPHOS 192*  BILITOT 0.3  PROT 7.1  ALBUMIN 4.0   CBC:  Recent Labs  05/02/13 0236  05/03/13 0346 05/04/13 0520  WBC 15.7*  < > 12.9* 19.0*  NEUTROABS 13.3*  --   --   --   HGB 13.8  < > 11.6* 12.7  HCT 42.4  < > 34.2* 38.5  MCV 95.9  < > 92.2 94.1  PLT 228  < > 186 221  < > = values in this interval not displayed. Cardiac Enzymes:  Recent Labs  05/03/13 2255 05/04/13 0520 05/04/13 1135  TROPONINI 0.34* <0.30 0.33*   Thyroid Function Tests:  Recent Labs  05/02/13 1230  TSH 1.577    Imaging: No results found.  Cardiac Studies:  ECG:  SR rate 112  Old IMI lateral T wave changes   Telemetry:  NSR no arrhythmia 05/04/2013   Echo:  EF 20-25%   Medications:   . albuterol  2.5 mg Nebulization TID   And  . ipratropium  0.5 mg Nebulization TID  . aspirin EC  81 mg Oral Daily  . atorvastatin  80 mg Oral q1800  . cefTRIAXone (ROCEPHIN)  IV  1 g Intravenous Q24H  . furosemide  40 mg Oral Daily  . guaiFENesin  600 mg Oral BID  . heparin  5,000 Units Subcutaneous Q8H  . methylPREDNISolone (SOLU-MEDROL) injection  60 mg Intravenous Q6H  . pantoprazole  40 mg Oral Daily  . sodium chloride  3 mL Intravenous Q12H  . trandolapril  4 mg Oral Daily     . sodium chloride 10 mL/hr (05/02/13 1820)  . sodium chloride 50 mL/hr (05/04/13 1130)  . nitroGLYCERIN 5 mcg/min (05/03/13 1900)    Assessment/Plan:  CHF:  Improved with diuresis on low dose ACE  PCWP 14 at cath  At low dose beta  blocker to prevent tachycardia and further ischemia Chol:  Continue statin  CAD:  Reviewed cath films  See note from [facility]  Could cover mid and distal LAD lesions with non overlapping stents.  He has recommneded Medical Rx  May increase risk of recurrent CHF  Not unreasonable given comorbidities and bipolar disease  Taper steroids  Possible d/c over weekend  F/U cards with Dr Donata Duff 05/04/2013, 2:40 PM

## 2013-05-04 NOTE — Progress Notes (Signed)
To cath lab.  Heparin d/c'd.  Ntg gtt infusing @ 1.5 cc/hr and NS at 10 CC/hr.  Foley without urine.  No voiced complaints.  Pt saw cardiac cath video this am and stated understanding of procedure.

## 2013-05-04 NOTE — Progress Notes (Signed)
[facility] PROGRESS NOTE   [name]:[id number] DOB: 1945/06/25 DOA: 05/02/2013 PCP: Fredirick Maudlin, MD  Brief narrative: Alexis Dillon is an 67 y.o. female with a PMH of mild mental retardation, COPD, current tobacco abuse, hypertension, atrial septal defect with closure late in life, was transferred from [facility] for evaluation of ongoing shortness of breath after she was discovered to have elevated troponins and an abnormal EKG with ST changes. She has been evaluated by cardiology.  Assessment/Plan: Principal Problem:   Acute coronary syndrome The patient was admitted and evaluated by cardiology. She was initially medically managed with IV heparin and a nitroglycerin drip, and underwent heart catheterization today which showed three-vessel coronary artery disease/ischemic cardiomyopathy with the ultimate recommendations for CABG versus PCI of the LAD. Heparin discontinued 05/04/2013 Continue aspirin therapy. She is also being managed with low-dose ACE inhibitor and diuretic therapy. Consider low-dose beta blocker therapy per cards, but with COPD exacerbation, may make breathing worse. Troponin reached a maximum value of 1.43, and was normal 05/03/2013. Active Problems:   HIGH BLOOD PRESSURE / hypertension Blood pressure control.   Acute on chronic systolic CHF (congestive heart failure) Status post 2-D echo on 05/02/2013. EF 20-25% with evidence of severe left ventricular and right ventricular failure. Continue medical therapy with ACE inhibitor and diuretics. Weight 43.2 kg, down from 45.8 kg.   COPD (chronic obstructive pulmonary disease) Continue duo nebs, IV steroids, supplemental oxygen as needed. Wean Solu-Medrol.   Tobacco abuse Counseled on cessation. Poor insight.   History of atrial septal defect Cardiology following.   Mental retardation, idiopathic mild Nursing staff providing supportive care.   Acute respiratory failure Multifactorial with COPD exacerbation,  acute on chronic systolic contributory. See above.  Code Status: Full. Family Communication: Family updated at bedside. Disposition Plan: Home when stable.   IV access:  Peripheral IV  Medical Consultants:  Dr. Charlton Haws, Cardiology.  Other Consultants:  None.  Anti-infectives:  None.   HPI/Subjective: [name] denies chest pain and states her breathing is much better. No complaints of nausea or vomiting. Says she had some foot cramping during her catheterization.  Objective: Filed Vitals:   05/04/13 1300 05/04/13 1330 05/04/13 1351 05/04/13 1400  BP: 154/68 125/72  107/51  Pulse: 85 89  93  Temp:      TempSrc:      Resp: 17 14  21   Height:      Weight:      SpO2: 94% 91% 95% 95%    Intake/Output Summary (Last 24 hours) at 05/04/13 1526 Last data filed at 05/04/13 1400  Gross per 24 hour  Intake   1306 ml  Output   2570 ml  Net  -1264 ml    Exam: Gen:  NAD Cardiovascular:  RRR, No M/R/G Respiratory:  Lungs clearer today Gastrointestinal:  Abdomen soft, NT/ND, + BS Extremities:  No C/E/C  Data Reviewed: Basic Metabolic Panel:  Recent Labs Lab 05/02/13 0236 05/02/13 1230 05/03/13 0346 05/04/13 0520  NA 137  --  133* 141  K 3.8  --  3.5 3.8  CL 98  --  96 99  CO2 25  --  25 28  GLUCOSE 121*  --  192* 157*  BUN 23  --  30* 34*  CREATININE 1.27* 1.22* 1.41* 1.34*  CALCIUM 10.0  --  8.6 8.7   GFR Estimated Creatinine Clearance: 27.4 ml/min (by C-G formula based on Cr of 1.34). Liver Function Tests:  Recent Labs Lab 05/02/13 0236  AST 255*  ALT 175*  ALKPHOS 192*  BILITOT 0.3  PROT 7.1  ALBUMIN 4.0   CBC:  Recent Labs Lab 05/02/13 0236 05/02/13 1230 05/03/13 0346 05/04/13 0520  WBC 15.7* 13.1* 12.9* 19.0*  NEUTROABS 13.3*  --   --   --   HGB 13.8 13.0 11.6* 12.7  HCT 42.4 38.4 34.2* 38.5  MCV 95.9 93.0 92.2 94.1  PLT 228 211 186 221   Cardiac Enzymes:  Recent Labs Lab 05/03/13 1055 05/03/13 1655 05/03/13 2255  05/04/13 0520 05/04/13 1135  TROPONINI <0.30 <0.30 0.34* <0.30 0.33*   BNP (last 3 results)  Recent Labs  05/02/13 0236  PROBNP 11638.0*   Thyroid function studies  Recent Labs  05/02/13 1230  TSH 1.577   Microbiology Recent Results (from the past 240 hour(s))  CULTURE, BLOOD (ROUTINE X 2)     Status: None   Collection Time    05/02/13  3:02 AM      Result Value Range Status   Specimen Description BLOOD RIGHT ANTECUBITAL   Final   Special Requests     Final   Value: BOTTLES DRAWN AEROBIC AND ANAEROBIC AEB 4CC ANA 2CC   Culture NO GROWTH <24 HRS   Final   Report Status PENDING   Incomplete  CULTURE, BLOOD (ROUTINE X 2)     Status: None   Collection Time    05/02/13  3:02 AM      Result Value Range Status   Specimen Description BLOOD RIGHT ANTECUBITAL   Final   Special Requests BOTTLES DRAWN AEROBIC ONLY 4CC   Final   Culture NO GROWTH <24 HRS   Final   Report Status PENDING   Incomplete  URINE CULTURE     Status: None   Collection Time    05/02/13  4:44 AM      Result Value Range Status   Specimen Description URINE, CLEAN CATCH   Final   Special Requests NONE   Final   Culture  Setup Time     Final   Value: 05/02/2013 10:41     Performed at [facility] Count     Final   Value: NO GROWTH     Performed at [facility]   Culture     Final   Value: NO GROWTH     Performed at [facility]   Report Status 05/03/2013 FINAL   Final  MRSA PCR SCREENING     Status: None   Collection Time    05/04/13 10:58 AM      Result Value Range Status   MRSA by PCR NEGATIVE  NEGATIVE Final   Comment:            The GeneXpert MRSA Assay (FDA     approved for NASAL specimens     only), is one component of a     comprehensive MRSA colonization     surveillance program. It is not     intended to diagnose MRSA     infection nor to guide or     monitor treatment for     MRSA infections.     Procedures and Diagnostic Studies: Dg Chest Port 1  View  05/02/2013   CLINICAL DATA:  Cough and shortness of breath. Wheezing.  EXAM: PORTABLE CHEST - 1 VIEW  COMPARISON:  12/14/2006.  FINDINGS: Chronic cardiomegaly. Status post median sternotomy. New, diffuse interstitial opacity. Haziness of the lower chest, likely small effusions. No pneumothorax. Chronic pulmonary hyperinflation.  IMPRESSION: 1. CHF with trace pleural effusions. 2. COPD.   Electronically Signed   By: Tiburcio Pea M.D.   On: 05/02/2013 03:01   Mm Digital Screening  05/02/2013   CLINICAL DATA:  Screening.  EXAM: DIGITAL SCREENING BILATERAL MAMMOGRAM WITH CAD  COMPARISON:  Previous exam(s).  ACR Breast Density Category b: There are scattered areas of fibroglandular density.  FINDINGS: There are no findings suspicious for malignancy. Images were processed with CAD.  IMPRESSION: No mammographic evidence of malignancy. A result letter of this screening mammogram will be mailed directly to the patient.  RECOMMENDATION: Screening mammogram in one year. (Code:SM-B-01Y)  BI-RADS CATEGORY  1: Negative   Electronically Signed   By: Christiana Pellant M.D.   On: 05/02/2013 13:15    Scheduled Meds: . albuterol  2.5 mg Nebulization TID   And  . ipratropium  0.5 mg Nebulization TID  . aspirin EC  81 mg Oral Daily  . atorvastatin  80 mg Oral q1800  . cefTRIAXone (ROCEPHIN)  IV  1 g Intravenous Q24H  . furosemide  40 mg Oral Daily  . guaiFENesin  600 mg Oral BID  . heparin  5,000 Units Subcutaneous Q8H  . [START ON 05/05/2013] methylPREDNISolone (SOLU-MEDROL) injection  60 mg Intravenous Q12H  . pantoprazole  40 mg Oral Daily  . sodium chloride  3 mL Intravenous Q12H  . trandolapril  4 mg Oral Daily   Continuous Infusions: . sodium chloride 10 mL/hr (05/02/13 1820)  . sodium chloride 50 mL/hr (05/04/13 1130)  . nitroGLYCERIN 5 mcg/min (05/03/13 1900)    Time spent: 25 minutes.    LOS: 2 days   Syble Picco  [facility] Pager [phone].   *Please note that the  hospitalists switch teams on Wednesdays. Please call the flow manager at [phone] if you are having difficulty reaching the hospitalist taking care of this patient as she can update you and provide the most up-to-date pager number of provider caring for the patient. If 8PM-8AM, please contact night-coverage at [email or web address], password [facility]  05/04/2013, 3:26 PM

## 2013-05-04 NOTE — CV Procedure (Addendum)
   Cardiac Catheterization Procedure Note  Name: Madisyn Mawhinney MRN: [id number] DOB: 17-Apr-1946  Procedure: Right Heart Cath, Left Heart Cath, Selective Coronary Angiography, LV angiography  Indication:  Ischemic cardiomyopathy  Procedural Details: The right groin was prepped, draped, and anesthetized with 1% lidocaine. Using the modified Seldinger technique a 5 French sheath was placed in the right femoral artery and a 7 French sheath was placed in the right femoral vein. A Swan-Ganz catheter was used for the right heart catheterization. Standard protocol was followed for recording of right heart pressures and sampling of oxygen saturations. Fick cardiac output was calculated. Standard Judkins catheters were used for selective coronary angiography and left ventriculography. There were no immediate procedural complications. The patient was transferred to the post catheterization recovery area for further monitoring.  The patient did have cramping in her right foot.  We suspected at the end of the case that the arterial sheath was occlusive.  She had resolution of pain with sheath removal.  She did have a pulse unchanged at the end of the case (DP audible by Doppler pre and post cath.)  Procedural Findings:  Hemodynamics:               RA 4    RV 28/1    PA 33/16  Mean 23    PCWP 14    LV 159/94    AO 151/12   Oxygen saturations:    PA 71    AO 97   Cardiac Output (Fick) 4.26            Cardiac Index (Fick) 2.96                      Coronary angiography:  Coronary dominance: right  Left mainstem: LM calcified with long 30% stenosis.    Left anterior descending (LAD):  The LAD is large and wraps the apex.  The mid vessel has 50% stenosis followed by a focal 80% stenosis.  D1 small with ostial 75% stenosis.    Left circumflex (LCx): AV groove is a large vessel.  There is mid 30% stenosis.  MOM appears to be moderate sized and is occluded at the ostium.  There is scant left to left  collateralization.   RI is small with mid 90% stenosis.   Right coronary artery (RCA):   Dominant.  Occluded at the ostium.  Left to right collaterals.    Left ventriculography: Left ventricular was not injected secondary to CKD.  She is known to have an EF of 20%.    Final Conclusions:  Severe 3 vessel CAD with ischemic cardiomyopathy.    Recommendations:  We will need to consider high risk revascularization.  CABG vs. PCI of the LAD.    [name] 05/04/2013, 10:04 AM   After further discussion with the patient's family including a sister who is a nurse at [employer] we believe that high risk CABG should not be considered for this patient.  There will be further family discussions to confirm this.  The patient has a very limited understanding of the situation and details.  We will consider medical management vs. PCI.  I would suggest medical management unless she has refractory symptoms.

## 2013-05-04 NOTE — Progress Notes (Signed)
ANTICOAGULATION CONSULT NOTE - Follow Up Consult  Pharmacy Consult for heparin Indication: chest pain/ACS  Labs:  Recent Labs  05/02/13 0236  05/02/13 1230  05/03/13 0346  05/03/13 1055 05/03/13 1207 05/03/13 1655 05/03/13 2255 05/04/13 0520  HGB 13.8  --  13.0  --  11.6*  --   --   --   --   --  12.7  HCT 42.4  --  38.4  --  34.2*  --   --   --   --   --  38.5  PLT 228  --  211  --  186  --   --   --   --   --  221  HEPARINUNFRC  --   --   --   --  0.41  --   --  0.35  --   --  0.18*  CREATININE 1.27*  --  1.22*  --  1.41*  --   --   --   --   --   --   TROPONINI  --   < >  --   < >  --   < > <0.30  --  <0.30 0.34*  --   < > = values in this interval not displayed.   Assessment: 67yo female now with subtherapeutic heparin level after two levels at low end of goal and trending down.  Goal of Therapy:  Heparin level 0.3-0.7 units/ml   Plan:  Will increase heparin gtt by 2-3 units/kg/hr to 750 units/hr until cath at 0900 and f/u after cath.  Vernard Gambles, PharmD, BCPS  05/04/2013,7:08 AM

## 2013-05-04 NOTE — Progress Notes (Signed)
CRITICAL VALUE ALERT  Critical value received: Troponin 0.34  Date of notification:  05/03/13  Time of notification:  0001  Critical value read back:yes  Nurse who received alert:  Haskel Schroeder  MD notified (1st page):  0005  Time of first page:  0005  MD notified (2nd page):  Time of second page:  Responding MD:  Donnamarie Poag  Time MD responded:  [phone]

## 2013-05-04 NOTE — H&P (View-Only) (Signed)
Patient ID: Alexis Dillon, female   DOB: Feb 03, 1946, 67 y.o.   MRN: [id number]    Subjective:  Leg cramps and coughing   Objective:  Filed Vitals:   05/03/13 0139 05/03/13 0437 05/03/13 0444 05/03/13 0456  BP:  108/49    Pulse: 94 111 88 89  Temp:  98 F (36.7 C)    TempSrc:  Oral    Resp:  18    Height:      Weight:  93 lb 7.6 oz (42.4 kg)    SpO2:  94%      Intake/Output from previous day:  Intake/Output Summary (Last 24 hours) at 05/03/13 0825 Last data filed at 05/03/13 9147  Gross per 24 hour  Intake 1480.12 ml  Output   2475 ml  Net -994.88 ml    Physical Exam: Affect appropriate Frail thin female  HEENT: normal Neck supple with no adenopathy JVP elevated no bruits no thyromegaly Lungs clear with no wheezing and good diaphragmatic motion Heart:  S1/S2 no murmur, no rub, gallop or click PMI enlarged  With RV heave  Abdomen: benighn, BS positve, no tenderness, no AAA no bruit.  No HSM or HJR Distal pulses intact with no bruits No edema Neuro non-focal Skin warm and dry multiple echymosis  No muscular weakness   Lab Results: Basic Metabolic Panel:  Recent Labs  82/95/62 0236 05/02/13 1230 05/03/13 0346  NA 137  --  133*  K 3.8  --  3.5  CL 98  --  96  CO2 25  --  25  GLUCOSE 121*  --  192*  BUN 23  --  30*  CREATININE 1.27* 1.22* 1.41*  CALCIUM 10.0  --  8.6   Liver Function Tests:  Recent Labs  05/02/13 0236  AST 255*  ALT 175*  ALKPHOS 192*  BILITOT 0.3  PROT 7.1  ALBUMIN 4.0   CBC:  Recent Labs  05/02/13 0236 05/02/13 1230 05/03/13 0346  WBC 15.7* 13.1* 12.9*  NEUTROABS 13.3*  --   --   HGB 13.8 13.0 11.6*  HCT 42.4 38.4 34.2*  MCV 95.9 93.0 92.2  PLT 228 211 186   Cardiac Enzymes:  Recent Labs  05/02/13 1730 05/02/13 2237 05/03/13 0435  TROPONINI 1.43* 0.63* 0.36*   Thyroid Function Tests:  Recent Labs  05/02/13 1230  TSH 1.577    Imaging: Dg Chest Port 1 View  05/02/2013   CLINICAL DATA:  Cough and  shortness of breath. Wheezing.  EXAM: PORTABLE CHEST - 1 VIEW  COMPARISON:  12/14/2006.  FINDINGS: Chronic cardiomegaly. Status post median sternotomy. New, diffuse interstitial opacity. Haziness of the lower chest, likely small effusions. No pneumothorax. Chronic pulmonary hyperinflation.  IMPRESSION: 1. CHF with trace pleural effusions. 2. COPD.   Electronically Signed   By: Tiburcio Pea M.D.   On: 05/02/2013 03:01    Cardiac Studies:  ECG:  Orders placed during the hospital encounter of 05/02/13  . EKG     Telemetry:  NSR no arrhythmia 05/03/2013   Echo:  EF 20-25%  Severe RV/LV failure with mild MR  Medications:   . albuterol  2.5 mg Nebulization TID   And  . ipratropium  0.5 mg Nebulization TID  . aspirin EC  81 mg Oral Daily  . atorvastatin  80 mg Oral q1800  . cefTRIAXone (ROCEPHIN)  IV  1 g Intravenous Q24H  . furosemide  40 mg Oral Daily  . methylPREDNISolone (SOLU-MEDROL) injection  60 mg Intravenous Q6H  . pantoprazole  40 mg Oral Daily  . sodium chloride  3 mL Intravenous Q12H  . trandolapril  4 mg Oral Daily     . sodium chloride 10 mL/hr (05/02/13 1820)  . heparin 650 Units/hr (05/02/13 2053)  . nitroGLYCERIN 5 mcg/min (05/02/13 1000)    Assessment/Plan:  CHF:  Improving on low dose ACE and diuretic.  Beta blocker not indicated in setting of acute COPD exacerbation She Would appear stable enough for right and left heart cath in am.  Orders written and put on board.  Overall prognosis  Seems poor given the severity of her lung and heart disease.   Respitory Failure:  Continue inhalers and prednisone  Plan per primary service May need home oxygen arranged  Discussed Smoking cessation with her but she has little insight  CXR with mild CHF and COPD but no acute infection   Charlton Haws 05/03/2013, 8:25 AM

## 2013-05-04 NOTE — Interval H&P Note (Signed)
History and Physical Interval Note:  05/04/2013 9:20 AM  Alexis Dillon  has presented today for surgery, with the diagnosis of hf  The various methods of treatment have been discussed with the patient and family. After consideration of risks, benefits and other options for treatment, the patient has consented to  Procedure(s): LEFT AND RIGHT HEART CATHETERIZATION WITH CORONARY ANGIOGRAM (N/A) as a surgical intervention .  The patient's history has been reviewed, patient examined, no change in status, stable for surgery.  I have reviewed the patient's chart and labs.  Questions were answered to the patient's satisfaction.     [name]

## 2013-05-05 DIAGNOSIS — I10 Essential (primary) hypertension: Secondary | ICD-10-CM

## 2013-05-05 DIAGNOSIS — N179 Acute kidney failure, unspecified: Secondary | ICD-10-CM

## 2013-05-05 LAB — BASIC METABOLIC PANEL
BUN: 42 mg/dL — ABNORMAL HIGH (ref 6–23)
Creatinine, Ser: 1.45 mg/dL — ABNORMAL HIGH (ref 0.50–1.10)
GFR calc Af Amer: 42 mL/min — ABNORMAL LOW (ref >90)
GFR calc non Af Amer: 36 mL/min — ABNORMAL LOW (ref >90)
Potassium: 3.5 mEq/L (ref 3.5–5.1)
Sodium: 139 mEq/L (ref 135–145)

## 2013-05-05 LAB — CBC
MCH: 31.2 pg (ref 26.0–34.0)
MCHC: 33.1 g/dL (ref 30.0–36.0)
Platelets: 269 10*3/uL (ref 150–400)
RBC: 4.58 MIL/uL (ref 3.87–5.11)
RDW: 14.1 % (ref 11.5–15.5)
WBC: 17.3 10*3/uL — ABNORMAL HIGH (ref 4.0–10.5)

## 2013-05-05 LAB — HEPARIN LEVEL (UNFRACTIONATED): Heparin Unfractionated: <0.1 IU/mL — ABNORMAL LOW (ref 0.30–0.70)

## 2013-05-05 LAB — TROPONIN I: Troponin I: <0.3 ng/mL (ref <0.30)

## 2013-05-05 MED ORDER — HEPARIN (PORCINE) IN NACL 100-0.45 UNIT/ML-% IJ SOLN
750.0000 [IU]/h | INTRAMUSCULAR | Status: DC
  Administered 2013-05-05 (×2): 750 [IU]/h via INTRAVENOUS
  Filled 2013-05-05: qty 250

## 2013-05-05 MED ORDER — POTASSIUM CHLORIDE CRYS ER 20 MEQ PO TBCR
40.0000 meq | EXTENDED_RELEASE_TABLET | Freq: Once | ORAL | Status: AC
  Administered 2013-05-05: 40 meq via ORAL
  Filled 2013-05-05: qty 2

## 2013-05-05 MED ORDER — AMIODARONE HCL IN DEXTROSE 360-4.14 MG/200ML-% IV SOLN
30.0000 mg/h | INTRAVENOUS | Status: DC
  Administered 2013-05-06: 30 mg/h via INTRAVENOUS
  Filled 2013-05-05 (×5): qty 200

## 2013-05-05 MED ORDER — AMIODARONE HCL IN DEXTROSE 360-4.14 MG/200ML-% IV SOLN
60.0000 mg/h | INTRAVENOUS | Status: AC
  Administered 2013-05-05 (×2): 60 mg/h via INTRAVENOUS
  Filled 2013-05-05: qty 200

## 2013-05-05 MED ORDER — AMIODARONE LOAD VIA INFUSION
150.0000 mg | Freq: Once | INTRAVENOUS | Status: AC
  Administered 2013-05-05: 150 mg via INTRAVENOUS
  Filled 2013-05-05: qty 83.34

## 2013-05-05 MED ORDER — PREDNISONE 20 MG PO TABS
40.0000 mg | ORAL_TABLET | Freq: Every day | ORAL | Status: DC
  Administered 2013-05-06: 40 mg via ORAL
  Filled 2013-05-05 (×2): qty 2

## 2013-05-05 NOTE — Progress Notes (Signed)
[facility] PROGRESS NOTE   Francelia Mclaren ZOX:[id number] DOB: 10/10/1945 DOA: 05/02/2013 PCP: Fredirick Maudlin, MD  Brief narrative: Alexis Dillon is an 67 y.o. female with a PMH of mild mental retardation, COPD, current tobacco abuse, hypertension, atrial septal defect with closure late in life, was transferred from [facility] for evaluation of ongoing shortness of breath after she was discovered to have elevated troponins and an abnormal EKG with ST changes. She has been evaluated by cardiology.  Assessment/Plan: Principal Problem:   Acute coronary syndrome The patient was admitted and evaluated by cardiology. She was initially medically managed with IV heparin and a nitroglycerin drip, and underwent heart catheterization 05/04/13 which showed three-vessel coronary artery disease/ischemic cardiomyopathy with the ultimate recommendations for CABG versus PCI of the LAD. Heparin discontinued 05/04/2013 Continue aspirin,  ACE inhibitor and diuretic. Consider low-dose beta blocker therapy per cards, but with COPD exacerbation, may make breathing worse, will defer to them. Troponin reached a maximum value of 1.43, and was normal 05/03/2013. Active Problems:   Stage III CKD, AKI GFR 37-44.  Creatinine slightly worse after cath.  Monitor.   HIGH BLOOD PRESSURE / hypertension Blood pressure controlled.   Acute systolic CHF (congestive heart failure) Status post 2-D echo on 05/02/2013. EF 20-25% with evidence of severe left ventricular and right ventricular failure. Continue medical therapy with ACE inhibitor and diuretics. Weight 43.1 kg, down from 45.8 kg.   COPD (chronic obstructive pulmonary disease) Continue duo nebs, IV steroids, supplemental oxygen as needed. D/C Solu-Medrol.  Prednisone taper.   Tobacco abuse Counseled on cessation. Poor insight.   History of atrial septal defect Cardiology following.   Mental retardation, idiopathic mild Nursing staff providing supportive care.   Acute  respiratory failure Multifactorial with COPD exacerbation, acute on chronic systolic contributory. See above.  Code Status: Full. Family Communication: Family updated at bedside. Disposition Plan: Home when stable.   IV access:  Peripheral IV  Medical Consultants:  Dr. Charlton Haws, Cardiology.  Other Consultants:  None.  Anti-infectives:  None.   HPI/Subjective: Elishia Kaczorowski denies chest pain and states her breathing is much better. Had some nausea and an episode of vomiting.  Objective: Filed Vitals:   05/05/13 1030 05/05/13 1055 05/05/13 1249 05/05/13 1431  BP:  116/84 138/68   Pulse:      Temp:   97.5 F (36.4 C)   TempSrc:   Oral   Resp:      Height:      Weight:      SpO2: 93%  100% 97%    Intake/Output Summary (Last 24 hours) at 05/05/13 1442 Last data filed at 05/05/13 1341  Gross per 24 hour  Intake   1120 ml  Output   2675 ml  Net  -1555 ml    Exam: Gen:  NAD Cardiovascular:  RRR, No M/R/G Respiratory:  Lungs with expiratory wheezes Gastrointestinal:  Abdomen soft, NT/ND, + BS Extremities:  No C/E/C  Data Reviewed: Basic Metabolic Panel:  Recent Labs Lab 05/02/13 0236 05/02/13 1230 05/03/13 0346 05/04/13 0520 05/05/13 0550  NA 137  --  133* 141 139  K 3.8  --  3.5 3.8 3.5  CL 98  --  96 99 97  CO2 25  --  25 28 27   GLUCOSE 121*  --  192* 157* 105*  BUN 23  --  30* 34* 42*  CREATININE 1.27* 1.22* 1.41* 1.34* 1.45*  CALCIUM 10.0  --  8.6 8.7 8.9   GFR Estimated Creatinine Clearance: 25.3 ml/min (  by C-G formula based on Cr of 1.45). Liver Function Tests:  Recent Labs Lab 05/02/13 0236  AST 255*  ALT 175*  ALKPHOS 192*  BILITOT 0.3  PROT 7.1  ALBUMIN 4.0   CBC:  Recent Labs Lab 05/02/13 0236 05/02/13 1230 05/03/13 0346 05/04/13 0520 05/05/13 0550  WBC 15.7* 13.1* 12.9* 19.0* 17.3*  NEUTROABS 13.3*  --   --   --   --   HGB 13.8 13.0 11.6* 12.7 14.3  HCT 42.4 38.4 34.2* 38.5 43.2  MCV 95.9 93.0 92.2 94.1 94.3   PLT 228 211 186 221 269   Cardiac Enzymes:  Recent Labs Lab 05/04/13 1135 05/04/13 1605 05/05/13 0203 05/05/13 0550 05/05/13 1032  TROPONINI 0.33* 0.31* <0.30 <0.30 <0.30   BNP (last 3 results)  Recent Labs  05/02/13 0236  PROBNP 11638.0*   Thyroid function studies No results found for this basename: TSH, T4TOTAL, FREET3, T3FREE, THYROIDAB,  in the last 72 hours Microbiology Recent Results (from the past 240 hour(s))  CULTURE, BLOOD (ROUTINE X 2)     Status: None   Collection Time    05/02/13  3:02 AM      Result Value Range Status   Specimen Description BLOOD RIGHT ANTECUBITAL   Final   Special Requests     Final   Value: BOTTLES DRAWN AEROBIC AND ANAEROBIC AEB 4CC ANA 2CC   Culture NO GROWTH <24 HRS   Final   Report Status PENDING   Incomplete  CULTURE, BLOOD (ROUTINE X 2)     Status: None   Collection Time    05/02/13  3:02 AM      Result Value Range Status   Specimen Description BLOOD RIGHT ANTECUBITAL   Final   Special Requests BOTTLES DRAWN AEROBIC ONLY 4CC   Final   Culture NO GROWTH <24 HRS   Final   Report Status PENDING   Incomplete  URINE CULTURE     Status: None   Collection Time    05/02/13  4:44 AM      Result Value Range Status   Specimen Description URINE, CLEAN CATCH   Final   Special Requests NONE   Final   Culture  Setup Time     Final   Value: 05/02/2013 10:41     Performed at [facility] Count     Final   Value: NO GROWTH     Performed at [facility]   Culture     Final   Value: NO GROWTH     Performed at [facility]   Report Status 05/03/2013 FINAL   Final  MRSA PCR SCREENING     Status: None   Collection Time    05/04/13 10:58 AM      Result Value Range Status   MRSA by PCR NEGATIVE  NEGATIVE Final   Comment:            The GeneXpert MRSA Assay (FDA     approved for NASAL specimens     only), is one component of a     comprehensive MRSA colonization     surveillance program. It is not      intended to diagnose MRSA     infection nor to guide or     monitor treatment for     MRSA infections.     Procedures and Diagnostic Studies: Dg Chest Port 1 View  05/02/2013   CLINICAL DATA:  Cough and shortness of breath. Wheezing.  EXAM: PORTABLE CHEST - 1 VIEW  COMPARISON:  12/14/2006.  FINDINGS: Chronic cardiomegaly. Status post median sternotomy. New, diffuse interstitial opacity. Haziness of the lower chest, likely small effusions. No pneumothorax. Chronic pulmonary hyperinflation.  IMPRESSION: 1. CHF with trace pleural effusions. 2. COPD.   Electronically Signed   By: Tiburcio Pea M.D.   On: 05/02/2013 03:01   Mm Digital Screening  05/02/2013   CLINICAL DATA:  Screening.  EXAM: DIGITAL SCREENING BILATERAL MAMMOGRAM WITH CAD  COMPARISON:  Previous exam(s).  ACR Breast Density Category b: There are scattered areas of fibroglandular density.  FINDINGS: There are no findings suspicious for malignancy. Images were processed with CAD.  IMPRESSION: No mammographic evidence of malignancy. A result letter of this screening mammogram will be mailed directly to the patient.  RECOMMENDATION: Screening mammogram in one year. (Code:[id number]-B-01Y)  BI-RADS CATEGORY  1: Negative   Electronically Signed   By: Christiana Pellant M.D.   On: 05/02/2013 13:15    Scheduled Meds: . albuterol  2.5 mg Nebulization TID   And  . ipratropium  0.5 mg Nebulization TID  . aspirin EC  81 mg Oral Daily  . atorvastatin  80 mg Oral q1800  . cefTRIAXone (ROCEPHIN)  IV  1 g Intravenous Q24H  . furosemide  40 mg Oral Daily  . guaiFENesin  600 mg Oral BID  . heparin  5,000 Units Subcutaneous Q8H  . pantoprazole  40 mg Oral Daily  . [START ON 05/06/2013] predniSONE  40 mg Oral Q breakfast  . sodium chloride  3 mL Intravenous Q12H  . trandolapril  4 mg Oral Daily   Continuous Infusions: . sodium chloride 10 mL/hr (05/02/13 1820)  . sodium chloride 50 mL/hr (05/04/13 1130)  . amiodarone (NEXTERONE PREMIX) 360 mg/200 mL  dextrose 60 mg/hr (05/05/13 1118)   Followed by  . amiodarone (NEXTERONE PREMIX) 360 mg/200 mL dextrose    . heparin 750 Units/hr (05/05/13 1103)  . nitroGLYCERIN Stopped (05/04/13 1400)    Time spent: 25 minutes.    LOS: 3 days   RAMA,CHRISTINA  [facility] Pager [phone].   *Please note that the hospitalists switch teams on Wednesdays. Please call the flow manager at [phone] if you are having difficulty reaching the hospitalist taking care of this patient as she can update you and provide the most up-to-date pager number of provider caring for the patient. If 8PM-8AM, please contact night-coverage at [email or web address], password [facility]  05/05/2013, 2:42 PM

## 2013-05-05 NOTE — Progress Notes (Addendum)
Patient ID: Alexis Dillon, female   DOB: 06/04/1945, 67 y.o.   MRN: [id number]    Subjective:  The patient feels less SOB today. She is laying comfortably in bed. She vomited after drinking orange juice this am, but now feels better.   Objective:  Filed Vitals:   05/05/13 0815 05/05/13 0816 05/05/13 0818 05/05/13 0819  BP: 98/71 144/65 112/71 104/79  Pulse:      Temp:      TempSrc:      Resp:      Height:      Weight:      SpO2: 89%       Intake/Output from previous day:  Intake/Output Summary (Last 24 hours) at 05/05/13 0934 Last data filed at 05/05/13 0500  Gross per 24 hour  Intake   1507 ml  Output   1925 ml  Net   -418 ml    Physical Exam: Affect appropriate Frail elderly female  HEENT: normal Neck supple with no adenopathy JVP normal no bruits no thyromegaly Lungs clear with no wheezing and good diaphragmatic motion Heart:  S1/S2 no murmur, no rub, gallop or click PMI normal Abdomen: benighn, BS positve, no tenderness, no AAA no bruit.  No HSM or HJR Distal pulses intact with no bruits No edema Neuro non-focal Skin warm and dry No muscular weakness Cath site ok no hematoma    Lab Results: Basic Metabolic Panel:  Recent Labs  24/40/10 0520 05/05/13 0550  NA 141 139  K 3.8 3.5  CL 99 97  CO2 28 27  GLUCOSE 157* 105*  BUN 34* 42*  CREATININE 1.34* 1.45*  CALCIUM 8.7 8.9   Liver Function Tests: No results found for this basename: AST, ALT, ALKPHOS, BILITOT, PROT, ALBUMIN,  in the last 72 hours CBC:  Recent Labs  05/04/13 0520 05/05/13 0550  WBC 19.0* 17.3*  HGB 12.7 14.3  HCT 38.5 43.2  MCV 94.1 94.3  PLT 221 269   Cardiac Enzymes:  Recent Labs  05/04/13 1605 05/05/13 0203 05/05/13 0550  TROPONINI 0.31* <0.30 <0.30   Thyroid Function Tests:  Recent Labs  05/02/13 1230  TSH 1.577    Imaging: No results found.  Cardiac Studies:  ECG:  SR rate 112 Old IMI lateral T wave changes   Telemetry:  NSR no arrhythmia  05/05/2013   Echo:  EF 20-25%   Medications:   . albuterol  2.5 mg Nebulization TID   And  . ipratropium  0.5 mg Nebulization TID  . aspirin EC  81 mg Oral Daily  . atorvastatin  80 mg Oral q1800  . cefTRIAXone (ROCEPHIN)  IV  1 g Intravenous Q24H  . furosemide  40 mg Oral Daily  . guaiFENesin  600 mg Oral BID  . heparin  5,000 Units Subcutaneous Q8H  . methylPREDNISolone (SOLU-MEDROL) injection  60 mg Intravenous Q12H  . pantoprazole  40 mg Oral Daily  . sodium chloride  3 mL Intravenous Q12H  . trandolapril  4 mg Oral Daily     . sodium chloride 10 mL/hr (05/02/13 1820)  . sodium chloride 50 mL/hr (05/04/13 1130)  . nitroGLYCERIN Stopped (05/04/13 1400)   Tele: A-fib, HR 70-105 BPM   Assessment/Plan:  67 year old female  1. CHF:  Acute on chronic systolic, LVEF=20-25%, Improved with diuresis on low dose Lasix and ACEI, PCWP 14 at cath  At low dose beta blocker to prevent tachycardia and further ischemia. Crea mildly worsened today, might  be a consequence of the  cath, we will  follow tomorrow.  2. A-fib - HR 70-105 BPM, new since 05/03/13, we will start iv Heparin and amiodarone load  3. Chol:  Continue statin   CAD:  Reviewed cath films  See [facility] from [facility]  Could cover mid and distal LAD lesions with non overlapping stents.  He has recommended  Medical Rx  May increase risk of recurrent CHF  Not unreasonable given comorbidities and bipolar disease  Taper steroids  Possible d/c over weekend  F/U cards with Dr Clois Dupes, H 05/05/2013, 9:34 AM

## 2013-05-05 NOTE — Progress Notes (Signed)
ANTICOAGULATION CONSULT NOTE - Follow Up Consult  Pharmacy Consult for heparin Indication: Afib  Labs:  Recent Labs  05/03/13 0346  05/03/13 1207  05/04/13 0520  05/04/13 1605 05/05/13 0203 05/05/13 0550  HGB 11.6*  --   --   --  12.7  --   --   --  14.3  HCT 34.2*  --   --   --  38.5  --   --   --  43.2  PLT 186  --   --   --  221  --   --   --  269  LABPROT  --   --   --   --  12.2  --   --   --   --   INR  --   --   --   --  0.92  --   --   --   --   HEPARINUNFRC 0.41  --  0.35  --  0.18*  --   --   --  <0.10*  CREATININE 1.41*  --   --   --  1.34*  --   --   --  1.45*  TROPONINI  --   < >  --   < > <0.30  < > 0.31* <0.30 <0.30  < > = values in this interval not displayed.   Assessment: 67yo female restarting heparin for Afib s/p cath  Goal of Therapy:  Heparin level 0.3-0.7 units/ml   Plan:  1) Heparin drip at 750 units / hr 2) 8 hour heparin level 3) Daily heparin level, CBC  Thank you. [name], PharmD [phone]  05/05/2013,10:54 AM

## 2013-05-06 DIAGNOSIS — I4891 Unspecified atrial fibrillation: Secondary | ICD-10-CM

## 2013-05-06 DIAGNOSIS — N183 Chronic kidney disease, stage 3 unspecified: Secondary | ICD-10-CM

## 2013-05-06 DIAGNOSIS — I5021 Acute systolic (congestive) heart failure: Secondary | ICD-10-CM

## 2013-05-06 LAB — BASIC METABOLIC PANEL
BUN: 40 mg/dL — ABNORMAL HIGH (ref 6–23)
CO2: 30 mEq/L (ref 19–32)
Chloride: 93 mEq/L — ABNORMAL LOW (ref 96–112)
Creatinine, Ser: 1.35 mg/dL — ABNORMAL HIGH (ref 0.50–1.10)
GFR calc non Af Amer: 39 mL/min — ABNORMAL LOW (ref >90)
Glucose, Bld: 119 mg/dL — ABNORMAL HIGH (ref 70–99)
Potassium: 4.8 mEq/L (ref 3.5–5.1)
Sodium: 135 mEq/L (ref 135–145)

## 2013-05-06 LAB — CBC
HCT: 42.6 % (ref 36.0–46.0)
Hemoglobin: 14.1 g/dL (ref 12.0–15.0)
MCHC: 33.1 g/dL (ref 30.0–36.0)
RBC: 4.5 MIL/uL (ref 3.87–5.11)
WBC: 15.1 10*3/uL — ABNORMAL HIGH (ref 4.0–10.5)

## 2013-05-06 LAB — HEPARIN LEVEL (UNFRACTIONATED)
Heparin Unfractionated: 1.08 IU/mL — ABNORMAL HIGH (ref 0.30–0.70)
Heparin Unfractionated: 0.41 IU/mL (ref 0.30–0.70)

## 2013-05-06 LAB — TROPONIN I
Troponin I: <0.3 ng/mL (ref <0.30)
Troponin I: <0.3 ng/mL (ref <0.30)

## 2013-05-06 LAB — HEPATIC FUNCTION PANEL
AST: 35 U/L (ref 0–37)
Albumin: 3.5 g/dL (ref 3.5–5.2)

## 2013-05-06 MED ORDER — AMIODARONE HCL 200 MG PO TABS
200.0000 mg | ORAL_TABLET | Freq: Every day | ORAL | Status: DC
  Filled 2013-05-06: qty 1

## 2013-05-06 MED ORDER — PREDNISONE 20 MG PO TABS
30.0000 mg | ORAL_TABLET | Freq: Every day | ORAL | Status: DC
  Administered 2013-05-07: 09:00:00 30 mg via ORAL
  Filled 2013-05-06 (×2): qty 1

## 2013-05-06 MED ORDER — AMIODARONE HCL IN DEXTROSE 360-4.14 MG/200ML-% IV SOLN
30.0000 mg/h | INTRAVENOUS | Status: DC
  Administered 2013-05-06 – 2013-05-08 (×4): 30 mg/h via INTRAVENOUS
  Filled 2013-05-06 (×14): qty 200

## 2013-05-06 MED ORDER — IPRATROPIUM BROMIDE 0.02 % IN SOLN: 0.5000 mg | Freq: Four times a day (QID) | RESPIRATORY_TRACT | Status: DC | PRN

## 2013-05-06 MED ORDER — WARFARIN - PHARMACIST DOSING INPATIENT: Freq: Every day | Status: DC

## 2013-05-06 MED ORDER — POLYETHYLENE GLYCOL 3350 17 G PO PACK
17.0000 g | PACK | Freq: Every day | ORAL | Status: DC
  Administered 2013-05-06 – 2013-05-08 (×3): 17 g via ORAL
  Filled 2013-05-06 (×4): qty 1

## 2013-05-06 MED ORDER — ALBUTEROL SULFATE (5 MG/ML) 0.5% IN NEBU: 2.5000 mg | INHALATION_SOLUTION | Freq: Three times a day (TID) | RESPIRATORY_TRACT | Status: DC

## 2013-05-06 MED ORDER — WARFARIN SODIUM 5 MG PO TABS
5.0000 mg | ORAL_TABLET | Freq: Once | ORAL | Status: DC
  Filled 2013-05-06: qty 1

## 2013-05-06 MED ORDER — HEPARIN (PORCINE) IN NACL 100-0.45 UNIT/ML-% IJ SOLN
450.0000 [IU]/h | INTRAMUSCULAR | Status: DC
  Filled 2013-05-06: qty 250

## 2013-05-06 MED ORDER — AMIODARONE HCL IN DEXTROSE 360-4.14 MG/200ML-% IV SOLN
60.0000 mg/h | INTRAVENOUS | Status: AC
  Administered 2013-05-06: 60 mg/h via INTRAVENOUS

## 2013-05-06 NOTE — Progress Notes (Signed)
ANTICOAGULATION CONSULT NOTE - Follow Up Consult  Pharmacy Consult for heparin Indication: atrial fibrillation  Labs:  Recent Labs  05/04/13 0520  05/05/13 0550  05/05/13 2017  05/06/13 0527 05/06/13 1100 05/06/13 1720 05/06/13 2012  HGB 12.7  --  14.3  --   --   --  14.1  --   --   --   HCT 38.5  --  43.2  --   --   --  42.6  --   --   --   PLT 221  --  269  --   --   --  231  --   --   --   LABPROT 12.2  --   --   --   --   --   --   --   --   --   INR 0.92  --   --   --   --   --   --   --   --   --   HEPARINUNFRC 0.18*  --  <0.10*  --  1.62*  --   --  1.08*  --  0.41  CREATININE 1.34*  --  1.45*  --   --   --  1.35*  --   --   --   TROPONINI <0.30  < > <0.30  < >  --   < > <0.30 <0.30 <0.30  --   < > = values in this interval not displayed.   Assessment: 67yo female on heparin after resumed post-cath for new Afib  Heparin level is therapeutic at 0.41  Goal of Therapy:  Heparin level 0.3-0.7 units/ml   Plan:  1) Continue heparin at 450 units/hr 2) Daily Heparin level and CBC while on heparin  Thank you. Celedonio Miyamoto, PharmD, BCPS Clinical Pharmacist Pager [phone]   05/06/2013,9:02 PM

## 2013-05-06 NOTE — Progress Notes (Signed)
ANTICOAGULATION CONSULT NOTE - Follow Up Consult  Pharmacy Consult for heparin Indication: atrial fibrillation  Labs:  Recent Labs  05/04/13 0520  05/05/13 0550  05/05/13 2017 05/05/13 2335 05/06/13 0527 05/06/13 1100  HGB 12.7  --  14.3  --   --   --  14.1  --   HCT 38.5  --  43.2  --   --   --  42.6  --   PLT 221  --  269  --   --   --  231  --   LABPROT 12.2  --   --   --   --   --   --   --   INR 0.92  --   --   --   --   --   --   --   HEPARINUNFRC 0.18*  --  <0.10*  --  1.62*  --   --  1.08*  CREATININE 1.34*  --  1.45*  --   --   --  1.35*  --   TROPONINI <0.30  < > <0.30  < >  --  <0.30 <0.30 <0.30  < > = values in this interval not displayed.   Assessment: 67yo female on heparin after resumed post-cath for new Afib  Heparin level is 1.08  Goal of Therapy:  Heparin level 0.3-0.7 units/ml   Plan:  1) Hold heparin x 30 minutes 2) Decrease heparin to 450 units / hr 3) Heparin level at 8 pm  Thank you. [name], PharmD [phone]  05/06/2013,12:44 PM

## 2013-05-06 NOTE — Progress Notes (Addendum)
Patient ID: Alexis Dillon, female   DOB: 22-Dec-1945, 67 y.o.   MRN: [id number]    Subjective:  The patient feels less SOB today. She is laying comfortably in bed.   Objective:  Filed Vitals:   05/06/13 0358 05/06/13 0400 05/06/13 0452 05/06/13 0756  BP:  112/64  92/75  Pulse:    96  Temp: 98 F (36.7 C)   97.6 F (36.4 C)  TempSrc: Oral   Oral  Resp:      Height:      Weight:   117 lb 11.6 oz (53.4 kg)   SpO2: 99%   94%    Intake/Output from previous day:  Intake/Output Summary (Last 24 hours) at 05/06/13 1046 Last data filed at 05/06/13 0900  Gross per 24 hour  Intake   1118 ml  Output   2200 ml  Net  -1082 ml    Physical Exam: Affect appropriate Frail elderly female  HEENT: normal Neck supple with no adenopathy JVP normal no bruits no thyromegaly Lungs clear with no wheezing and good diaphragmatic motion Heart:  S1/S2 no murmur, no rub, gallop or click PMI normal Abdomen: benighn, BS positve, no tenderness, no AAA no bruit.  No HSM or HJR Distal pulses intact with no bruits No edema Neuro non-focal Skin warm and dry No muscular weakness Cath site ok no hematoma    Lab Results: Basic Metabolic Panel:  Recent Labs  95/28/41 0550 05/06/13 0527  NA 139 135  K 3.5 4.8  CL 97 93*  CO2 27 30  GLUCOSE 105* 119*  BUN 42* 40*  CREATININE 1.45* 1.35*  CALCIUM 8.9 8.7   Liver Function Tests: No results found for this basename: AST, ALT, ALKPHOS, BILITOT, PROT, ALBUMIN,  in the last 72 hours CBC:  Recent Labs  05/05/13 0550 05/06/13 0527  WBC 17.3* 15.1*  HGB 14.3 14.1  HCT 43.2 42.6  MCV 94.3 94.7  PLT 269 231   Cardiac Enzymes:  Recent Labs  05/05/13 1646 05/05/13 2335 05/06/13 0527  TROPONINI <0.30 <0.30 <0.30   Thyroid Function Tests: No results found for this basename: TSH, T4TOTAL, FREET3, T3FREE, THYROIDAB,  in the last 72 hours  Imaging: No results found.  Cardiac Studies:  ECG:  SR rate 112 Old IMI lateral T wave  changes   Telemetry:  NSR no arrhythmia 05/06/2013   Echo:  EF 20-25%   Medications:   . aspirin EC  81 mg Oral Daily  . atorvastatin  80 mg Oral q1800  . cefTRIAXone (ROCEPHIN)  IV  1 g Intravenous Q24H  . furosemide  40 mg Oral Daily  . guaiFENesin  600 mg Oral BID  . pantoprazole  40 mg Oral Daily  . predniSONE  40 mg Oral Q breakfast  . sodium chloride  3 mL Intravenous Q12H  . trandolapril  4 mg Oral Daily     . sodium chloride 10 mL/hr (05/02/13 1820)  . sodium chloride 50 mL/hr (05/04/13 1130)  . amiodarone (NEXTERONE PREMIX) 360 mg/200 mL dextrose 30 mg/hr (05/06/13 0600)  . heparin 600 Units (05/06/13 0600)  . nitroGLYCERIN Stopped (05/04/13 1400)    Cath 05/04/13 Coronary dominance: right  Left mainstem: LM calcified with long 30% stenosis.  Left anterior descending (LAD): The LAD is large and wraps the apex. The mid vessel has 50% stenosis followed by a focal 80% stenosis. D1 small with ostial 75% stenosis.  Left circumflex (LCx): AV groove is a large vessel. There is mid 30% stenosis. MOM  appears to be moderate sized and is occluded at the ostium. There is scant left to left collateralization. RI is small with mid 90% stenosis.  Right coronary artery (RCA): Dominant. Occluded at the ostium. Left to right collaterals.  Left ventriculography: Left ventricular was not injected secondary to CKD. She is known to have an EF of 20%.  Final Conclusions: Severe 3 vessel CAD with ischemic cardiomyopathy.  Recommendations: We will need to consider high risk revascularization. CABG vs. PCI of the LAD.   Tele: A-fib, HR 70-105 BPM    Assessment/Plan:  67 year old female  1. CHF:  Acute on chronic systolic, LVEF=20-25%, Improved with diuresis on low dose Lasix and ACEI, PCWP 14 at cath  At low dose beta blocker to prevent tachycardia and further ischemia. Crea mildly worsened yesterday, but improving today. I/O in 24 Hours -1 liter  2. CAD:  CABG vs. PCI of the LAD,  probably not a good surgical candidate considering bipolar disorder and underlying comorbidities.  We will plan for PCI once her AKI improves, possibly discharge and bring her back later.   3. A-fib - HR 70-105 BPM, new since 05/03/13, started on iv Heparin and amiodarone load, we will continue for now, if no spontaneous cardioversion, consider TEE/DCCV in the morning  4. Chol:  Continue statin   F/U cards with Dr Clois Dupes, H 05/06/2013, 10:46 AM

## 2013-05-06 NOTE — Progress Notes (Signed)
[facility] PROGRESS NOTE   Alexis Dillon NFA:[id number] DOB: 23-Mar-1946 DOA: 05/02/2013 PCP: Fredirick Maudlin, MD  Brief narrative: Alexis Dillon is an 67 y.o. female with a PMH of mild mental retardation, COPD, current tobacco abuse, hypertension, atrial septal defect with closure late in life, was transferred from [facility] for evaluation of ongoing shortness of breath after she was discovered to have elevated troponins and an abnormal EKG with ST changes. She has been evaluated by cardiology.  Assessment/Plan: Principal Problem:   Acute coronary syndrome The patient was admitted and evaluated by cardiology. She was initially medically managed with IV heparin and a nitroglycerin drip, and underwent heart catheterization 05/04/13 which showed three-vessel coronary artery disease/ischemic cardiomyopathy with the ultimate recommendations for CABG versus PCI of the LAD.  Continue aspirin/heparin,  ACE inhibitor and diuretic. Consider low-dose beta blocker therapy per cards, but with COPD exacerbation, may make breathing worse, will defer to cardiology. Troponin reached a maximum value of 1.43, and was normal 05/03/2013. Active Problems:   Atrial fibrillation Continue heparin and amiodarone. Cardiology to consider TEE/DCCV.   Elevated LFTs Likely from passive congestion of the liver, improving.   Stage III CKD, AKI GFR 37-44.  Creatinine slightly worse after cath.  Slightly improved today.   HIGH BLOOD PRESSURE / hypertension Blood pressure controlled.   Acute systolic CHF (congestive heart failure) Status post 2-D echo on 05/02/2013. EF 20-25% with evidence of severe left ventricular and right ventricular failure. Continue medical therapy with ACE inhibitor and diuretics. Weight 53.4 kg, up from 43.1 kg.   COPD (chronic obstructive pulmonary disease) Continue duo nebs, IV steroids, supplemental oxygen as needed. Solu-Medrol discontinued 05/05/2013.  Status post 5 days of Rocephin. Now on  Prednisone taper.   Tobacco abuse Counseled on cessation. Poor insight.   History of atrial septal defect Cardiology following.   Mental retardation, idiopathic mild Nursing staff providing supportive care.   Acute respiratory failure Multifactorial with COPD exacerbation, acute on chronic systolic contributory. See above.  Code Status: Full. Family Communication: No family at bedside today. Disposition Plan: Home when stable.   IV access:  Peripheral IV  Medical Consultants:  Dr. Charlton Haws, Cardiology.  Other Consultants:  None.  Anti-infectives:  Rocephin 05/02/2013---> 05/06/2013  HPI/Subjective: Alexis Dillon denies chest pain and states her breathing is much better. No current nausea or vomiting. Feels a little constipated.  Objective: Filed Vitals:   05/06/13 0400 05/06/13 0452 05/06/13 0756 05/06/13 1201  BP: 112/64  92/75 144/81  Pulse:   96 91  Temp:   97.6 F (36.4 C) 98 F (36.7 C)  TempSrc:   Oral Oral  Resp:      Height:      Weight:  53.4 kg (117 lb 11.6 oz)    SpO2:   94% 99%    Intake/Output Summary (Last 24 hours) at 05/06/13 1350 Last data filed at 05/06/13 0900  Gross per 24 hour  Intake  705.6 ml  Output   1450 ml  Net -744.4 ml    Exam: Gen:  NAD Cardiovascular:  HSIR Respiratory:  Lungs clearer today Gastrointestinal:  Abdomen soft, NT/ND, + BS Extremities:  No C/E/C  Data Reviewed: Basic Metabolic Panel:  Recent Labs Lab 05/02/13 0236 05/02/13 1230 05/03/13 0346 05/04/13 0520 05/05/13 0550 05/06/13 0527  NA 137  --  133* 141 139 135  K 3.8  --  3.5 3.8 3.5 4.8  CL 98  --  96 99 97 93*  CO2 25  --  25  28 27 30   GLUCOSE 121*  --  192* 157* 105* 119*  BUN 23  --  30* 34* 42* 40*  CREATININE 1.27* 1.22* 1.41* 1.34* 1.45* 1.35*  CALCIUM 10.0  --  8.6 8.7 8.9 8.7   GFR Estimated Creatinine Clearance: 33.6 ml/min (by C-G formula based on Cr of 1.35). Liver Function Tests:  Recent Labs Lab 05/02/13 0236  05/06/13 1100  AST 255* PENDING  ALT 175* 48*  ALKPHOS 192* 102  BILITOT 0.3 0.2*  PROT 7.1 6.4  ALBUMIN 4.0 3.5   CBC:  Recent Labs Lab 05/02/13 0236 05/02/13 1230 05/03/13 0346 05/04/13 0520 05/05/13 0550 05/06/13 0527  WBC 15.7* 13.1* 12.9* 19.0* 17.3* 15.1*  NEUTROABS 13.3*  --   --   --   --   --   HGB 13.8 13.0 11.6* 12.7 14.3 14.1  HCT 42.4 38.4 34.2* 38.5 43.2 42.6  MCV 95.9 93.0 92.2 94.1 94.3 94.7  PLT 228 211 186 221 269 231   Cardiac Enzymes:  Recent Labs Lab 05/05/13 1032 05/05/13 1646 05/05/13 2335 05/06/13 0527 05/06/13 1100  TROPONINI <0.30 <0.30 <0.30 <0.30 <0.30   BNP (last 3 results)  Recent Labs  05/02/13 0236  PROBNP 11638.0*   Microbiology Recent Results (from the past 240 hour(s))  CULTURE, BLOOD (ROUTINE X 2)     Status: None   Collection Time    05/02/13  3:02 AM      Result Value Range Status   Specimen Description BLOOD RIGHT ANTECUBITAL   Final   Special Requests     Final   Value: BOTTLES DRAWN AEROBIC AND ANAEROBIC AEB 4CC ANA 2CC   Culture NO GROWTH <24 HRS   Final   Report Status PENDING   Incomplete  CULTURE, BLOOD (ROUTINE X 2)     Status: None   Collection Time    05/02/13  3:02 AM      Result Value Range Status   Specimen Description BLOOD RIGHT ANTECUBITAL   Final   Special Requests BOTTLES DRAWN AEROBIC ONLY 4CC   Final   Culture NO GROWTH <24 HRS   Final   Report Status PENDING   Incomplete  URINE CULTURE     Status: None   Collection Time    05/02/13  4:44 AM      Result Value Range Status   Specimen Description URINE, CLEAN CATCH   Final   Special Requests NONE   Final   Culture  Setup Time     Final   Value: 05/02/2013 10:41     Performed at [facility] Count     Final   Value: NO GROWTH     Performed at [facility]   Culture     Final   Value: NO GROWTH     Performed at [facility]   Report Status 05/03/2013 FINAL   Final  MRSA PCR SCREENING     Status: None    Collection Time    05/04/13 10:58 AM      Result Value Range Status   MRSA by PCR NEGATIVE  NEGATIVE Final   Comment:            The GeneXpert MRSA Assay (FDA     approved for NASAL specimens     only), is one component of a     comprehensive MRSA colonization     surveillance program. It is not     intended to diagnose MRSA  infection nor to guide or     monitor treatment for     MRSA infections.     Procedures and Diagnostic Studies: Dg Chest Port 1 View  05/02/2013   CLINICAL DATA:  Cough and shortness of breath. Wheezing.  EXAM: PORTABLE CHEST - 1 VIEW  COMPARISON:  12/14/2006.  FINDINGS: Chronic cardiomegaly. Status post median sternotomy. New, diffuse interstitial opacity. Haziness of the lower chest, likely small effusions. No pneumothorax. Chronic pulmonary hyperinflation.  IMPRESSION: 1. CHF with trace pleural effusions. 2. COPD.   Electronically Signed   By: Tiburcio Pea M.D.   On: 05/02/2013 03:01   Mm Digital Screening  05/02/2013   CLINICAL DATA:  Screening.  EXAM: DIGITAL SCREENING BILATERAL MAMMOGRAM WITH CAD  COMPARISON:  Previous exam(s).  ACR Breast Density Category b: There are scattered areas of fibroglandular density.  FINDINGS: There are no findings suspicious for malignancy. Images were processed with CAD.  IMPRESSION: No mammographic evidence of malignancy. A result letter of this screening mammogram will be mailed directly to the patient.  RECOMMENDATION: Screening mammogram in one year. (Code:SM-B-01Y)  BI-RADS CATEGORY  1: Negative   Electronically Signed   By: Christiana Pellant M.D.   On: 05/02/2013 13:15    Scheduled Meds: . aspirin EC  81 mg Oral Daily  . atorvastatin  80 mg Oral q1800  . cefTRIAXone (ROCEPHIN)  IV  1 g Intravenous Q24H  . furosemide  40 mg Oral Daily  . guaiFENesin  600 mg Oral BID  . pantoprazole  40 mg Oral Daily  . polyethylene glycol  17 g Oral Daily  . predniSONE  40 mg Oral Q breakfast  . sodium chloride  3 mL Intravenous  Q12H  . trandolapril  4 mg Oral Daily   Continuous Infusions: . sodium chloride 10 mL/hr (05/02/13 1820)  . sodium chloride 50 mL/hr (05/04/13 1130)  . amiodarone (NEXTERONE PREMIX) 360 mg/200 mL dextrose 60 mg/hr (05/06/13 1320)  . amiodarone (NEXTERONE PREMIX) 360 mg/200 mL dextrose    . heparin 600 Units (05/06/13 0600)  . nitroGLYCERIN Stopped (05/04/13 1400)    Time spent: 25 minutes.    LOS: 4 days   RAMA,CHRISTINA  [facility] Pager [phone].   *Please note that the hospitalists switch teams on Wednesdays. Please call the flow manager at [phone] if you are having difficulty reaching the hospitalist taking care of this patient as she can update you and provide the most up-to-date pager number of provider caring for the patient. If 8PM-8AM, please contact night-coverage at [email or web address], password [facility]  05/06/2013, 1:50 PM

## 2013-05-06 NOTE — Progress Notes (Signed)
ANTICOAGULATION CONSULT NOTE - Follow Up Consult  Pharmacy Consult for heparin Indication: atrial fibrillation  Labs:  Recent Labs  05/03/13 0346  05/04/13 0520  05/05/13 0550 05/05/13 1032 05/05/13 1646 05/05/13 2017 05/05/13 2335  HGB 11.6*  --  12.7  --  14.3  --   --   --   --   HCT 34.2*  --  38.5  --  43.2  --   --   --   --   PLT 186  --  221  --  269  --   --   --   --   LABPROT  --   --  12.2  --   --   --   --   --   --   INR  --   --  0.92  --   --   --   --   --   --   HEPARINUNFRC 0.41  < > 0.18*  --  <0.10*  --   --  1.62*  --   CREATININE 1.41*  --  1.34*  --  1.45*  --   --   --   --   TROPONINI  --   < > <0.30  < > <0.30 <0.30 <0.30  --  <0.30  < > = values in this interval not displayed.   Assessment: 67yo female supratherapeutic on heparin after resumed post-cath for new Afib (long delay from lab for result d/t needing to verify w/ second run of sample); confirmed w/ RN that lab was drawn correctly; of note pt did receive one dose of SQ heparin after gtt begun though did not receive a bolus and should not have severely affected level when drawn.  Goal of Therapy:  Heparin level 0.3-0.7 units/ml   Plan:  Will hold heparin gtt x1.25hr then decrease gtt by 3-4 units/kg/hr to 600 units/hr and check level in 8hr.  Vernard Gambles, PharmD, BCPS  05/06/2013,1:48 AM

## 2013-05-07 LAB — CBC
HCT: 45.6 % (ref 36.0–46.0)
Hemoglobin: 15.1 g/dL — ABNORMAL HIGH (ref 12.0–15.0)
MCHC: 33.1 g/dL (ref 30.0–36.0)
RDW: 13.9 % (ref 11.5–15.5)
WBC: 14.7 10*3/uL — ABNORMAL HIGH (ref 4.0–10.5)

## 2013-05-07 LAB — BASIC METABOLIC PANEL
BUN: 43 mg/dL — ABNORMAL HIGH (ref 6–23)
Chloride: 91 mEq/L — ABNORMAL LOW (ref 96–112)
Creatinine, Ser: 1.46 mg/dL — ABNORMAL HIGH (ref 0.50–1.10)
GFR calc Af Amer: 41 mL/min — ABNORMAL LOW (ref >90)
GFR calc non Af Amer: 36 mL/min — ABNORMAL LOW (ref >90)
Glucose, Bld: 122 mg/dL — ABNORMAL HIGH (ref 70–99)
Potassium: 4.9 mEq/L (ref 3.5–5.1)
Sodium: 131 mEq/L — ABNORMAL LOW (ref 135–145)

## 2013-05-07 LAB — CULTURE, BLOOD (ROUTINE X 2)

## 2013-05-07 LAB — TROPONIN I
Troponin I: <0.3 ng/mL (ref <0.30)
Troponin I: <0.3 ng/mL (ref <0.30)
Troponin I: <0.3 ng/mL (ref <0.30)
Troponin I: <0.3 ng/mL (ref <0.30)
Troponin I: <0.3 ng/mL (ref <0.30)

## 2013-05-07 LAB — PROTIME-INR: Prothrombin Time: 13 seconds (ref 11.6–15.2)

## 2013-05-07 LAB — HEPARIN LEVEL (UNFRACTIONATED): Heparin Unfractionated: 0.2 IU/mL — ABNORMAL LOW (ref 0.30–0.70)

## 2013-05-07 MED ORDER — PREDNISONE 20 MG PO TABS
20.0000 mg | ORAL_TABLET | Freq: Every day | ORAL | Status: DC
  Administered 2013-05-08 – 2013-05-11 (×4): 20 mg via ORAL
  Filled 2013-05-07 (×6): qty 1

## 2013-05-07 MED ORDER — HEPARIN (PORCINE) IN NACL 100-0.45 UNIT/ML-% IJ SOLN
550.0000 [IU]/h | INTRAMUSCULAR | Status: DC
  Filled 2013-05-07: qty 250

## 2013-05-07 MED ORDER — SODIUM CHLORIDE 0.9 % IV SOLN
INTRAVENOUS | Status: DC
  Administered 2013-05-07: 20 mL/h via INTRAVENOUS
  Administered 2013-05-08: 07:00:00 via INTRAVENOUS

## 2013-05-07 MED ORDER — APIXABAN 2.5 MG PO TABS
2.5000 mg | ORAL_TABLET | Freq: Two times a day (BID) | ORAL | Status: DC
  Administered 2013-05-07 – 2013-05-11 (×9): 2.5 mg via ORAL
  Filled 2013-05-07 (×10): qty 1

## 2013-05-07 NOTE — Progress Notes (Addendum)
Clinical Social Work Department CLINICAL SOCIAL WORK PLACEMENT NOTE 05/07/2013  Patient:  Alexis Dillon, Alexis Dillon  Account Number:  [id number] Admit date:  05/02/2013  Clinical Social Worker:  Maryclare Labrador, Theresia Majors  Date/time:  05/07/2013 03:48 PM  Clinical Social Work is seeking post-discharge placement for this patient at the following level of care:   SKILLED NURSING   (*CSW will update this form in Epic as items are completed)   05/07/2013  Patient/family provided with [facility] Department of Clinical Social Work's list of facilities offering this level of care within the geographic area requested by the patient (or if unable, by the patient's family).  05/07/2013  Patient/family informed of their freedom to choose among providers that offer the needed level of care, that participate in Medicare, Medicaid or managed care program needed by the patient, have an available bed and are willing to accept the patient.  05/07/2013  Patient/family informed of [facility]' ownership interest in [facility], as well as of the fact that they are under no obligation to receive care at this facility.  PASARR submitted to EDS on 05/07/2013 PASARR number received from EDS on 05/07/2013  FL2 transmitted to all facilities in geographic area requested by pt/family on  05/07/2013 FL2 transmitted to all facilities within larger geographic area on   Patient informed that his/her managed care company has contracts with or will negotiate with  certain facilities, including the following:     Patient/family informed of bed offers received:  05/07/2013 Patient chooses bed at [facility] Physician recommends and patient chooses bed at    Patient to be transferred to [facility] on   Patient to be transferred to facility by   The following physician request were entered in Epic:   Additional Comments:   Maryclare Labrador, MSW, [facility] Clinical Social Worker [phone]

## 2013-05-07 NOTE — Progress Notes (Signed)
[facility] PROGRESS NOTE   Alexis Dillon ZOX:[id number] DOB: 10/16/1945 DOA: 05/02/2013 PCP: Fredirick Maudlin, MD  Brief narrative: Alexis Dillon is an 67 y.o. female with a PMH of mild mental retardation, COPD, current tobacco abuse, hypertension, atrial septal defect with closure late in life, was transferred from [facility] for evaluation of ongoing shortness of breath after she was discovered to have elevated troponins and an abnormal EKG with ST changes. She has been evaluated by cardiology.  Assessment/Plan: Principal Problem:   Acute coronary syndrome The patient was admitted and evaluated by cardiology. She was initially medically managed with IV heparin and a nitroglycerin drip, and underwent heart catheterization 05/04/13 which showed three-vessel coronary artery disease/ischemic cardiomyopathy with the ultimate recommendations for CABG versus PCI of the LAD.  Continue aspirin/heparin,  ACE inhibitor and diuretic. Consider low-dose beta blocker therapy per cards, but with COPD exacerbation, may make breathing worse, will defer to cardiology. Troponin reached a maximum value of 1.43, and was subsequently normal 05/03/2013. Active Problems:   Atrial fibrillation Continue heparin and amiodarone. Cardiology to consider TEE/DCCV.   Elevated LFTs Likely from passive congestion of the liver, improving.   Stage III CKD, AKI GFR 37-44.  Creatinine slightly worse after cath.  Steadily improving.   HIGH BLOOD PRESSURE / hypertension Blood pressure controlled.   Acute systolic CHF (congestive heart failure) / ischemic cardiomyopathy Status post 2-D echo on 05/02/2013. EF 20-25% with evidence of severe left ventricular and right ventricular failure. Continue medical therapy with ACE inhibitor and diuretics. Weight 53.1 kg, down from 53.4 kg yesterday.   COPD (chronic obstructive pulmonary disease) Continue duo nebs, IV steroids, supplemental oxygen as needed. Solu-Medrol discontinued  05/05/2013.  Status post 5 days of Rocephin. Now on Prednisone taper.   Tobacco abuse Counseled on cessation. Poor insight.   History of atrial septal defect Cardiology following.   Mental retardation, idiopathic mild Nursing staff providing supportive care.   Acute respiratory failure Multifactorial with COPD exacerbation, acute on chronic systolic contributory. See above.  Code Status: Full. Family Communication: No family at bedside today. Disposition Plan: Home when stable.   IV access:  Peripheral IV  Medical Consultants:  Dr. Charlton Haws, Cardiology.  Other Consultants:  None.  Anti-infectives:  Rocephin 05/02/2013---> 05/06/2013  HPI/Subjective: Alexis Dillon had an episode of chest pain last PM, associated indigestion,cleared with cough.  No further chest pain.  No dyspnea, occasional cough.  Objective: Filed Vitals:   05/06/13 2000 05/06/13 2350 05/07/13 0415 05/07/13 0423  BP: 136/51 124/62 106/53   Pulse:      Temp:  97.6 F (36.4 C) 97.7 F (36.5 C)   TempSrc:  Oral Oral   Resp:      Height:      Weight:    53.1 kg (117 lb 1 oz)  SpO2:  97% 98%     Intake/Output Summary (Last 24 hours) at 05/07/13 0708 Last data filed at 05/07/13 0600  Gross per 24 hour  Intake 943.29 ml  Output    775 ml  Net 168.29 ml    Exam: Gen:  NAD Cardiovascular:  HSIR Respiratory:  Lungs clearer today Gastrointestinal:  Abdomen soft, NT/ND, + BS Extremities:  No C/E/C  Data Reviewed: Basic Metabolic Panel:  Recent Labs Lab 05/02/13 0236 05/02/13 1230 05/03/13 0346 05/04/13 0520 05/05/13 0550 05/06/13 0527  NA 137  --  133* 141 139 135  K 3.8  --  3.5 3.8 3.5 4.8  CL 98  --  96 99 97 93*  CO2 25  --  25 28 27 30   GLUCOSE 121*  --  192* 157* 105* 119*  BUN 23  --  30* 34* 42* 40*  CREATININE 1.27* 1.22* 1.41* 1.34* 1.45* 1.35*  CALCIUM 10.0  --  8.6 8.7 8.9 8.7   GFR Estimated Creatinine Clearance: 33.4 ml/min (by C-G formula based on Cr of  1.35). Liver Function Tests:  Recent Labs Lab 05/02/13 0236 05/06/13 1100  AST 255* 35  ALT 175* 48*  ALKPHOS 192* 102  BILITOT 0.3 0.2*  PROT 7.1 6.4  ALBUMIN 4.0 3.5   CBC:  Recent Labs Lab 05/02/13 0236 05/02/13 1230 05/03/13 0346 05/04/13 0520 05/05/13 0550 05/06/13 0527  WBC 15.7* 13.1* 12.9* 19.0* 17.3* 15.1*  NEUTROABS 13.3*  --   --   --   --   --   HGB 13.8 13.0 11.6* 12.7 14.3 14.1  HCT 42.4 38.4 34.2* 38.5 43.2 42.6  MCV 95.9 93.0 92.2 94.1 94.3 94.7  PLT 228 211 186 221 269 231   Cardiac Enzymes:  Recent Labs Lab 05/05/13 2335 05/06/13 0527 05/06/13 1100 05/06/13 1720 05/06/13 2344  TROPONINI <0.30 <0.30 <0.30 <0.30 <0.30   BNP (last 3 results)  Recent Labs  05/02/13 0236  PROBNP 11638.0*   Microbiology Recent Results (from the past 240 hour(s))  CULTURE, BLOOD (ROUTINE X 2)     Status: None   Collection Time    05/02/13  3:02 AM      Result Value Range Status   Specimen Description BLOOD RIGHT ANTECUBITAL   Final   Special Requests     Final   Value: BOTTLES DRAWN AEROBIC AND ANAEROBIC AEB 4CC ANA 2CC   Culture NO GROWTH <24 HRS   Final   Report Status PENDING   Incomplete  CULTURE, BLOOD (ROUTINE X 2)     Status: None   Collection Time    05/02/13  3:02 AM      Result Value Range Status   Specimen Description BLOOD RIGHT ANTECUBITAL   Final   Special Requests BOTTLES DRAWN AEROBIC ONLY 4CC   Final   Culture NO GROWTH <24 HRS   Final   Report Status PENDING   Incomplete  URINE CULTURE     Status: None   Collection Time    05/02/13  4:44 AM      Result Value Range Status   Specimen Description URINE, CLEAN CATCH   Final   Special Requests NONE   Final   Culture  Setup Time     Final   Value: 05/02/2013 10:41     Performed at [facility] Count     Final   Value: NO GROWTH     Performed at [facility]   Culture     Final   Value: NO GROWTH     Performed at [facility]   Report Status  05/03/2013 FINAL   Final  MRSA PCR SCREENING     Status: None   Collection Time    05/04/13 10:58 AM      Result Value Range Status   MRSA by PCR NEGATIVE  NEGATIVE Final   Comment:            The GeneXpert MRSA Assay (FDA     approved for NASAL specimens     only), is one component of a     comprehensive MRSA colonization     surveillance program. It is not  intended to diagnose MRSA     infection nor to guide or     monitor treatment for     MRSA infections.     Procedures and Diagnostic Studies: Dg Chest Port 1 View  05/02/2013   CLINICAL DATA:  Cough and shortness of breath. Wheezing.  EXAM: PORTABLE CHEST - 1 VIEW  COMPARISON:  12/14/2006.  FINDINGS: Chronic cardiomegaly. Status post median sternotomy. New, diffuse interstitial opacity. Haziness of the lower chest, likely small effusions. No pneumothorax. Chronic pulmonary hyperinflation.  IMPRESSION: 1. CHF with trace pleural effusions. 2. COPD.   Electronically Signed   By: Tiburcio Pea M.D.   On: 05/02/2013 03:01   Mm Digital Screening  05/02/2013   CLINICAL DATA:  Screening.  EXAM: DIGITAL SCREENING BILATERAL MAMMOGRAM WITH CAD  COMPARISON:  Previous exam(s).  ACR Breast Density Category b: There are scattered areas of fibroglandular density.  FINDINGS: There are no findings suspicious for malignancy. Images were processed with CAD.  IMPRESSION: No mammographic evidence of malignancy. A result letter of this screening mammogram will be mailed directly to the patient.  RECOMMENDATION: Screening mammogram in one year. (Code:SM-B-01Y)  BI-RADS CATEGORY  1: Negative   Electronically Signed   By: Christiana Pellant M.D.   On: 05/02/2013 13:15    Scheduled Meds: . aspirin EC  81 mg Oral Daily  . atorvastatin  80 mg Oral q1800  . furosemide  40 mg Oral Daily  . guaiFENesin  600 mg Oral BID  . pantoprazole  40 mg Oral Daily  . polyethylene glycol  17 g Oral Daily  . predniSONE  30 mg Oral Q breakfast  . sodium chloride  3 mL  Intravenous Q12H  . trandolapril  4 mg Oral Daily   Continuous Infusions: . sodium chloride 10 mL/hr (05/02/13 1820)  . sodium chloride 50 mL/hr (05/04/13 1130)  . amiodarone (NEXTERONE PREMIX) 360 mg/200 mL dextrose 30 mg/hr (05/07/13 0600)  . heparin 450 Units/hr (05/07/13 0600)  . nitroGLYCERIN Stopped (05/04/13 1400)    Time spent: 25 minutes.    LOS: 5 days   Alexis Dillon  [facility] Pager [phone].   *Please note that the hospitalists switch teams on Wednesdays. Please call the flow manager at [phone] if you are having difficulty reaching the hospitalist taking care of this patient as she can update you and provide the most up-to-date pager number of provider caring for the patient. If 8PM-8AM, please contact night-coverage at [email or web address], password [facility]  05/07/2013, 7:08 AM

## 2013-05-07 NOTE — Progress Notes (Signed)
ANTICOAGULATION CONSULT NOTE - Follow Up Consult  Pharmacy Consult for heparin Indication: atrial fibrillation  Labs:  Recent Labs  05/05/13 0550  05/06/13 0527 05/06/13 1100 05/06/13 1720 05/06/13 2012 05/06/13 2344 05/07/13 0603  HGB 14.3  --  14.1  --   --   --   --  15.1*  HCT 43.2  --  42.6  --   --   --   --  45.6  PLT 269  --  231  --   --   --   --  267  LABPROT  --   --   --   --   --   --   --  13.0  INR  --   --   --   --   --   --   --  1.00  HEPARINUNFRC <0.10*  < >  --  1.08*  --  0.41  --  0.20*  CREATININE 1.45*  --  1.35*  --   --   --   --  1.46*  TROPONINI <0.30  < > <0.30 <0.30 <0.30  --  <0.30 <0.30  < > = values in this interval not displayed.   Assessment: 67yo female on heparin s/p cath with 3VCAD and afib. No plans for CABG but for PCI when AKI stable. Also noted for possible TEE/DCCV today. Heparin level is below goal (HL= 0.2)  Goal of Therapy:  Heparin level 0.3-0.7 units/ml   Plan:  -Increase heparin to 550 units/hr -Heparin level in 8 hrs  Harland German, Pharm D 05/07/2013 7:53 AM

## 2013-05-07 NOTE — Progress Notes (Signed)
Patient Name: Alexis Dillon Date of Encounter: 05/07/2013   Principal Problem:   Acute coronary syndrome Active Problems:   HIGH BLOOD PRESSURE   CHF (congestive heart failure)   COPD (chronic obstructive pulmonary disease)   Tobacco abuse   Hypertension   History of atrial septal defect   Mental retardation, idiopathic mild   Acute respiratory failure   Acute systolic CHF (congestive heart failure)   CKD (chronic kidney disease), stage III   AKI (acute kidney injury)   Elevated LFTs   Atrial fibrillation   Cardiomyopathy, ischemic   SUBJECTIVE  Had some epigastric pain last night - improved with belching.  No sob.  Has had productive cough with white, foamy sputum.  Afebrile.  In good spirits.  CURRENT MEDS . aspirin EC  81 mg Oral Daily  . atorvastatin  80 mg Oral q1800  . furosemide  40 mg Oral Daily  . guaiFENesin  600 mg Oral BID  . pantoprazole  40 mg Oral Daily  . polyethylene glycol  17 g Oral Daily  . predniSONE  30 mg Oral Q breakfast  . sodium chloride  3 mL Intravenous Q12H  . trandolapril  4 mg Oral Daily   OBJECTIVE  Filed Vitals:   05/06/13 2350 05/07/13 0415 05/07/13 0423 05/07/13 0759  BP: 124/62 106/53  142/77  Pulse:    85  Temp: 97.6 F (36.4 C) 97.7 F (36.5 C)  97.4 F (36.3 C)  TempSrc: Oral Oral  Oral  Resp:      Height:      Weight:   117 lb 1 oz (53.1 kg)   SpO2: 97% 98%  95%    Intake/Output Summary (Last 24 hours) at 05/07/13 0848 Last data filed at 05/07/13 0700  Gross per 24 hour  Intake 1390.59 ml  Output   1025 ml  Net 365.59 ml   Filed Weights   05/05/13 0445 05/06/13 0452 05/07/13 0423  Weight: 95 lb 0.3 oz (43.1 kg) 117 lb 11.6 oz (53.4 kg) 117 lb 1 oz (53.1 kg)    PHYSICAL EXAM  General: Pleasant, NAD. Neuro: Alert and oriented X 3. Moves all extremities spontaneously. Psych: Normal affect. HEENT:  Normal  Neck: Supple without bruits or JVD. Lungs:  Resp regular and unlabored, initially diminished breath  sounds with exp wheezing, but after cough, lung sounds cleared. Heart: RRR no s3, s4, or murmurs. Abdomen: Soft, non-tender, non-distended, BS + x 4.  Extremities: No clubbing, cyanosis or edema. DP/PT/Radials 2+ and equal bilaterally.  Accessory Clinical Findings  CBC  Recent Labs  05/06/13 0527 05/07/13 0603  WBC 15.1* 14.7*  HGB 14.1 15.1*  HCT 42.6 45.6  MCV 94.7 94.6  PLT 231 267   Basic Metabolic Panel  Recent Labs  05/06/13 0527 05/07/13 0603  NA 135 131*  K 4.8 4.9  CL 93* 91*  CO2 30 28  GLUCOSE 119* 122*  BUN 40* 43*  CREATININE 1.35* 1.46*  CALCIUM 8.7 8.7   Liver Function Tests  Recent Labs  05/06/13 1100  AST 35  ALT 48*  ALKPHOS 102  BILITOT 0.2*  PROT 6.4  ALBUMIN 3.5   Cardiac Enzymes  Recent Labs  05/06/13 1720 05/06/13 2344 05/07/13 0603  TROPONINI <0.30 <0.30 <0.30   TELE  Aflutter, 90's to low 100's.  ASSESSMENT AND PLAN  1.  ACS/NSTEMI/CAD:  S/p cath on 12/19 revealing moderate LAD dzs, sev RI dzs (small), and an occluded RCA.  Consideration being given to PCI of  the LAD once creat improves.  She reported indigestion-like chest pain overnight that cleared with cough.  No chest pain this AM.  Cont asa, statin.  If plan is for either PCI or med Rx, would add plavix.  Per family, pt apparently not a good candidate for CABG.  She is not currently on BB therapy in setting of severe COPD on admission.  Will consider adding once off of steroids.  2.  Acute systolic CHF/ICM:  EF 20-25%.  Euvolemic on exam. Weights are likely inaccurate (up to 117 from 95 lbs two days ago).  Cont ACEI, oral lasix.  No bb at this point 2/2 AECOPD.  3.  Afib/flutter:  On amio and heparin currently.  Tele overnight reviewed - looks like aflutter with variable conduction.  12 lead this AM confirms.  Possible DCCV this AM.  Hold off on oral anticoagulation until plan +/- PCI is clear.  4.  Acute exacerbation of COPD:  On steroids/inhalers.  Lungs sounds  improving.  Per IM.  Smoking cessation advised.  5.  Acute renal failure: creat up slightly since yesterday.  Follow.  Signed, Nicolasa Ducking NP  Patient seen, examined. Available data reviewed. Agree with findings, assessment, and plan as outlined by Ward Givens, NP. Exam reveals an alert, oriented woman in no acute distress. Her lungs show diminished breath sounds at both bases. Her heart is regular rate and rhythm without murmur or gallop. There is no peripheral edema. I have personally reviewed her echocardiogram and cardiac cath images. 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 Her LAD has moderate diffuse stenosis. I do not think PCI would improve her outcome. However, with severely reduced LV function, and I think it would be prudent to restore sinus rhythm. She has been started on amiodarone. Will switch her from IV heparin to Eliquis. She will be started at 2.5 mg twice daily. She is borderline for the normal dose, but her creatinine clearance is markedly diminished despite her serum creatinine of 1.4. I think it is safer to treat her with a reduced dose of Eliquis. Plan on TEE cardioversion tomorrow after her third dose of Eliquis is given. Have reviewed the risks, indications, and alternatives with the patient and her family.  Tonny Bollman, M.D. 05/07/2013 3:21 PM

## 2013-05-07 NOTE — Progress Notes (Signed)
Clinical Social Work Department BRIEF PSYCHOSOCIAL ASSESSMENT 05/07/2013  Patient:  Alexis Dillon, Alexis Dillon     Account Number:  [id number]     Admit date:  05/02/2013  Clinical Social Worker:  Varney Biles  Date/Time:  05/07/2013 03:43 PM  Referred by:  Physician  Date Referred:  05/07/2013 Referred for  SNF Placement   Other Referral:   Interview type:  Patient Other interview type:    PSYCHOSOCIAL DATA Living Status:  FAMILY Admitted from facility:   Level of care:   Primary support name:  Lysbeth Penner [phone] and [phone]) Primary support relationship to patient:  SIBLING Degree of support available:   Good--pt lives with sister and nephews.    CURRENT CONCERNS Current Concerns  Post-Acute Placement   Other Concerns:    SOCIAL WORK ASSESSMENT / PLAN CSW spoke with pt, who states she believes SNF for short-term rehab would be a good idea before going home. CSW submitted clinicals to the 5 facilities in [place], and pt had bed offers from [facility], [facility], and [facility]. Pt chooses [facility]. CSW also left a voicemail with pt's sister, at pt's request. CSW awaiting return call from pt's sister and will facilitate discharge to [facility] when appropriate.   Assessment/plan status:  Psychosocial Support/Ongoing Assessment of Needs Other assessment/ plan:   Information/referral to community resources:   [facility].    PATIENT'S/FAMILY'S RESPONSE TO PLAN OF CARE: Pt receptive to CSW visit and stated she needs SNF before returning home with her sister.       Maryclare Labrador, MSW, [facility] Clinical Social Worker [phone]

## 2013-05-07 NOTE — Progress Notes (Signed)
Physical Therapy Treatment Patient Details Name: Alexis Dillon MRN: [id number] DOB: 1945-06-26 Today's Date: 05/07/2013 Time: 5284-1324 PT Time Calculation (min): 24 min  PT Assessment / Plan / Recommendation  History of Present Illness Alexis Dillon is a 67 y.o. 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 She was admitted last night around 7 pm with worsening productive cough and dyspnea. She was found to have an elevated troponin and abnormal EKG with ST changes and was transferred to [facility].    PT Comments   Patient making progress with mobility and gait.  Balance remains unsteady with decreased activity tolerance.  Agree with need for SNF at discharge for continued therapy.  Follow Up Recommendations  SNF     Does the patient have the potential to tolerate intense rehabilitation     Barriers to Discharge        Equipment Recommendations  None recommended by PT    Recommendations for Other Services    Frequency Min 3X/week   Progress towards PT Goals Progress towards PT goals: Progressing toward goals  Plan Discharge plan needs to be updated    Precautions / Restrictions Precautions Precautions: Fall Restrictions Weight Bearing Restrictions: No   Pertinent Vitals/Pain     Mobility  Bed Mobility Bed Mobility: Sit to Supine Sit to Supine: 5: Supervision;With rail Details for Bed Mobility Assistance: No cues or physical assist needed. Transfers Transfers: Sit to Stand;Stand to Sit Sit to Stand: 4: Min guard;With upper extremity assist;With armrests;From chair/3-in-1 Stand to Sit: 4: Min guard;With upper extremity assist;To bed Details for Transfer Assistance: Cues for hand placement.  Cues to control descent onto bed for safety. Ambulation/Gait Ambulation/Gait Assistance: 4: Min guard Ambulation  Distance (Feet): 168 Feet Assistive device: Straight cane Ambulation/Gait Assistance Details: Somewhat unsteady with gait.  Cues for safe use of cane.  Assist for balance/safety. Gait Pattern: Step-through pattern Gait velocity: decreased General Gait Details: Patient reports "grinding" in rt knee at end of ambulation.  HR increased to 122.      PT Goals (current goals can now be found in the care plan section)    Visit Information  Last PT Received On: 05/07/13 Assistance Needed: +1 History of Present Illness: Alexis Dillon is a 67 y.o. 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 She was admitted last night around 7 pm with worsening productive cough and dyspnea. She was found to have an elevated troponin and abnormal EKG with ST changes and was transferred to [facility].     Subjective Data  Subjective: "I'm going to a Rehab place before I go home"   Cognition  Cognition Arousal/Alertness: Awake/alert Behavior During Therapy: WFL for tasks assessed/performed (Very talkative) Overall Cognitive Status: History of cognitive impairments - at baseline    Balance     End of Session PT - End of Session Activity Tolerance: Patient limited by fatigue Patient left: in bed;with call bell/phone within reach Nurse Communication: Mobility status   GP     Vena Austria 05/07/2013, 5:15 PM Durenda Hurt. Renaldo Fiddler, [facility] Acute Rehab Services Pager [phone]

## 2013-05-08 ENCOUNTER — Inpatient Hospital Stay (HOSPITAL_COMMUNITY): Payer: Medicare Other | Admitting: Anesthesiology

## 2013-05-08 ENCOUNTER — Encounter (HOSPITAL_COMMUNITY): Payer: Medicare Other | Admitting: Anesthesiology

## 2013-05-08 ENCOUNTER — Encounter (HOSPITAL_COMMUNITY)
Admission: EM | Disposition: A | Discharge status: Skilled Nursing Facility | Payer: Medicaid Other | Source: Home / Self Care | Attending: Internal Medicine

## 2013-05-08 ENCOUNTER — Encounter (HOSPITAL_COMMUNITY): Payer: Self-pay | Admitting: Anesthesiology

## 2013-05-08 DIAGNOSIS — I4891 Unspecified atrial fibrillation: Secondary | ICD-10-CM

## 2013-05-08 HISTORY — PX: TEE WITHOUT CARDIOVERSION: SHX5443

## 2013-05-08 HISTORY — PX: CARDIOVERSION: SHX1299

## 2013-05-08 LAB — TROPONIN I
Troponin I: <0.3 ng/mL (ref <0.30)
Troponin I: <0.3 ng/mL (ref <0.30)
Troponin I: <0.3 ng/mL (ref <0.30)

## 2013-05-08 LAB — PROTIME-INR
INR: 1.04 (ref 0.00–1.49)
Prothrombin Time: 13.4 seconds (ref 11.6–15.2)

## 2013-05-08 LAB — BASIC METABOLIC PANEL
BUN: 51 mg/dL — ABNORMAL HIGH (ref 6–23)
Chloride: 90 mEq/L — ABNORMAL LOW (ref 96–112)
Creatinine, Ser: 1.79 mg/dL — ABNORMAL HIGH (ref 0.50–1.10)
GFR calc Af Amer: 32 mL/min — ABNORMAL LOW (ref >90)
GFR calc non Af Amer: 28 mL/min — ABNORMAL LOW (ref >90)
Potassium: 4.6 mEq/L (ref 3.5–5.1)
Sodium: 133 mEq/L — ABNORMAL LOW (ref 135–145)

## 2013-05-08 LAB — CBC
HCT: 44 % (ref 36.0–46.0)
Hemoglobin: 14.8 g/dL (ref 12.0–15.0)
MCHC: 33.6 g/dL (ref 30.0–36.0)
RBC: 4.7 MIL/uL (ref 3.87–5.11)
WBC: 15.2 10*3/uL — ABNORMAL HIGH (ref 4.0–10.5)

## 2013-05-08 SURGERY — ECHOCARDIOGRAM, TRANSESOPHAGEAL
Anesthesia: Monitor Anesthesia Care

## 2013-05-08 MED ORDER — MIDAZOLAM HCL 5 MG/ML IJ SOLN
INTRAMUSCULAR | Status: AC
  Filled 2013-05-08: qty 2

## 2013-05-08 MED ORDER — FENTANYL CITRATE 0.05 MG/ML IJ SOLN
INTRAMUSCULAR | Status: DC | PRN
  Administered 2013-05-08 (×2): 25 ug via INTRAVENOUS

## 2013-05-08 MED ORDER — BUTAMBEN-TETRACAINE-BENZOCAINE 2-2-14 % EX AERO
INHALATION_SPRAY | CUTANEOUS | Status: DC | PRN
  Administered 2013-05-08: 2 via TOPICAL

## 2013-05-08 MED ORDER — FENTANYL CITRATE 0.05 MG/ML IJ SOLN
INTRAMUSCULAR | Status: AC
  Filled 2013-05-08: qty 2

## 2013-05-08 MED ORDER — MIDAZOLAM HCL 10 MG/2ML IJ SOLN
INTRAMUSCULAR | Status: DC | PRN
  Administered 2013-05-08: 2 mg via INTRAVENOUS
  Administered 2013-05-08: 1 mg via INTRAVENOUS

## 2013-05-08 MED ORDER — PROPOFOL 10 MG/ML IV BOLUS
INTRAVENOUS | Status: DC | PRN
  Administered 2013-05-08: 40 mg via INTRAVENOUS

## 2013-05-08 NOTE — Progress Notes (Signed)
Pt converted to ? NSR during TEE...12 lead obtained,..questions as to flutter vs SR.Marland KitchenMarland Kitchen

## 2013-05-08 NOTE — Anesthesia Preprocedure Evaluation (Addendum)
Anesthesia Evaluation  Patient identified by MRN, date of birth, ID band Patient awake    Reviewed: Allergy & Precautions, H&P , NPO status , Patient's Chart, lab work & pertinent test results  History of Anesthesia Complications Negative for: history of anesthetic complications  Airway Mallampati: II TM Distance: >3 FB Neck ROM: Full    Dental  (+) Dental Advisory Given, Edentulous Upper and Edentulous Lower   Pulmonary COPD COPD inhaler, Current Smoker,  breath sounds clear to auscultation        Cardiovascular hypertension, Pt. on medications + Past MI (NSTEMI) and +CHF + dysrhythmias Atrial Fibrillation Rhythm:Regular Rate:Normal  H/o ASD: closure in childhood Cath: multi-vessel ASCADz, EF 30%   Neuro/Psych PSYCHIATRIC DISORDERS Mental deficit    GI/Hepatic Neg liver ROS, GERD-  Medicated and Controlled,  Endo/Other  negative endocrine ROS  Renal/GU Renal InsufficiencyRenal disease (creat 1.79)     Musculoskeletal   Abdominal   Peds  Hematology negative hematology ROS (+)   Anesthesia Other Findings   Reproductive/Obstetrics                        Anesthesia Physical Anesthesia Plan  ASA: III  Anesthesia Plan: General   Post-op Pain Management:    Induction: Intravenous  Airway Management Planned: Simple Face Mask and Natural Airway  Additional Equipment:   Intra-op Plan:   Post-operative Plan:   Informed Consent: I have reviewed the patients History and Physical, chart, labs and discussed the procedure including the risks, benefits and alternatives for the proposed anesthesia with the patient or authorized representative who has indicated his/her understanding and acceptance.   Dental advisory given  Plan Discussed with: CRNA and Surgeon  Anesthesia Plan Comments: (Plan routine monitors, GA for cardioversion)       Anesthesia Quick Evaluation

## 2013-05-08 NOTE — CV Procedure (Signed)
    TRANSESOPHAGEAL ECHOCARDIOGRAM GUIDED DIRECT CURRENT CARDIOVERSION  NAME:  Alexis Dillon   MRN: [id number] DOB:  [birth year]   ADMIT DATE: 05/02/2013  INDICATIONS:  Atrial fib/flutter  PROCEDURE:   Informed consent was obtained prior to the procedure. The risks, benefits and alternatives for the procedure were discussed and the patient comprehended these risks.  Risks include, but are not limited to, cough, sore throat, vomiting, nausea, somnolence, esophageal and stomach trauma or perforation, bleeding, low blood pressure, aspiration, pneumonia, infection, trauma to the teeth and death.    After a procedural time-out, the patient was given 3 mg versed and 50 mcg fentanyl for moderate sedation.  The oropharynx was anesthetized with cetacaine spray.  The transesophageal probe was inserted in the esophagus and stomach without difficulty and multiple views were obtained.    FINDINGS:  LEFT VENTRICLE: EF =  25-30% Akinesis of the mid to distal anterior wall, lateral wall and inferolateral wall. Apex moves well.   RIGHT VENTRICLE: Moderately hypokinetic  LEFT ATRIUM: Upper normal size (4.0 cm). + smoke. Normal pulmonary vein flow.   LEFT ATRIAL APPENDAGE: No clot  RIGHT ATRIUM: Normal  AORTIC VALVE:  Normal. Trileaflet. No AS. Trivial AI  MITRAL VALVE:    Normal. Trivial MR   TRICUSPID VALVE: Normal. Trivial TR  PULMONIC VALVE: Poorly visualized. No PI.   INTERATRIAL SEPTUM: Thick. No PFO/ASD  PERICARDIUM: No effusion  DESCENDING AORTA: Severe diffuse plaque   CARDIOVERSION:     Indications:  Atrial Fibrillation  Procedure Details:  Once the TEE was complete, the patient had the defibrillator pads placed in the anterior and posterior position. The patient then underwent further sedation by the anesthesia service for cardioversion. Once an appropriate level of sedation was achieved, the patient received a single biphasic, synchronized 150J shock with prompt conversion to  sinus rhythm. No apparent complications.  COMPLICATIONS:    There were no immediate complications.   CONCLUSION:   1.  Successful TEE guided cardioversion.    Solace Manwarren,MD 4:40 PM

## 2013-05-08 NOTE — H&P (View-Only) (Signed)
    Subjective:  Feeling better. No chest pain or shortness of breath at rest.  Objective:  Vital Signs in the last 24 hours: Temp:  [97.4 F (36.3 C)-98.1 F (36.7 C)] 98.1 F (36.7 C) (12/23 0738) Pulse Rate:  [86-122] 91 (12/23 0738) BP: (86-148)/(54-88) 148/80 mmHg (12/23 0738) SpO2:  [93 %-96 %] 96 % (12/23 0738) Weight:  [117 lb 11.6 oz (53.4 kg)] 117 lb 11.6 oz (53.4 kg) (12/23 0450)  Intake/Output from previous day: 12/22 0701 - 12/23 0700 In: 2003.6 [P.O.:1040; I.V.:963.6] Out: 1300 [Urine:1300]  Physical Exam: Pt is alert and oriented, NAD HEENT: normal Neck: JVP - normal Lungs: decreased BS bilaterally CV: irregularly irregular without murmur or gallop Abd: soft, NT, Positive BS, no hepatomegaly Ext: no C/C/E, distal pulses intact and equal Skin: warm/dry no rash   Lab Results:  Recent Labs  05/07/13 0603 05/08/13 0452  WBC 14.7* 15.2*  HGB 15.1* 14.8  PLT 267 278    Recent Labs  05/07/13 0603 05/08/13 0452  NA 131* 133*  K 4.9 4.6  CL 91* 90*  CO2 28 29  GLUCOSE 122* 113*  BUN 43* 51*  CREATININE 1.46* 1.79*    Recent Labs  05/07/13 2300 05/08/13 0452  TROPONINI <0.30 <0.30    Tele: Atrial flutter, heart rate 80's  Assessment/Plan:  1. Acute systolic heart failure - LVEF less than 30% 2. Atrial flutter, new onset 3. ASD s/p closure 4. NSTEMI - multivessel CAD at cath with chronic occlusion of the OM and RCA, diffuse LAD stenosis. Med Rx planned - films reviewed. 5. Acute kidney injury - creatinine worse today. Hold mavik and lasix  Pt has been on IV heparin, now transitioned to Apixaban and has received 3 doses for TEE/cardioversion today. I have reviewed risks, indication, alternatives. I think important to attempt sinus rhythm considering her severe LV dysfunction. She would like to wait for her family member to arrive before proceeding with TEE/cardioversion, but she is agreeable with plan. Continue IV amio today and transition  to oral tomorrow.  Dispo: ST-SNF  Earnestine Shipp, M.D. 05/08/2013, 8:16 AM     

## 2013-05-08 NOTE — Transfer of Care (Signed)
Immediate Anesthesia Transfer of Care Note  Patient: Alexis Dillon  Procedure(s) Performed: Procedure(s) with comments: TRANSESOPHAGEAL ECHOCARDIOGRAM (TEE) (N/A) CARDIOVERSION (N/A) - Dr. Adriana Reams verified with EP pt in a flutter...will cardiovert...  150 joules @ 4:40, using Propofol 40  mg/IV .Marland Kitchen.successful NSR   Patient Location: Endoscopy Unit  Anesthesia Type:MAC  Level of Consciousness: awake, alert  and oriented  Airway & Oxygen Therapy: Patient Spontanous Breathing and Patient connected to nasal cannula oxygen  Post-op Assessment: Report given to PACU RN, Post -op Vital signs reviewed and stable and Patient moving all extremities X 4  Post vital signs: Reviewed and stable  Complications: No apparent anesthesia complications

## 2013-05-08 NOTE — Progress Notes (Signed)
[facility] PROGRESS NOTE   Alexis Dillon ZOX:[id number] DOB: [birth year] DOA: 05/02/2013 PCP: Fredirick Maudlin, MD  Brief narrative: Alexis Dillon is an 67 y.o. female with a PMH of mild mental retardation, COPD, current tobacco abuse, hypertension, atrial septal defect with closure late in life, was transferred from [facility] for evaluation of ongoing shortness of breath after she was discovered to have elevated troponins and an abnormal EKG with ST changes. She has been evaluated by cardiology.  Assessment/Plan: Principal Problem:   Acute coronary syndrome The patient was admitted and evaluated by cardiology. She was initially medically managed with IV heparin and a nitroglycerin drip, and underwent heart catheterization 05/04/13 which showed three-vessel coronary artery disease/ischemic cardiomyopathy with the ultimate recommendations for CABG versus PCI of the LAD.  Continue aspirin/heparin,  ACE inhibitor and diuretic. Consider low-dose beta blocker therapy per cards, but with COPD exacerbation, may make breathing worse, will defer to cardiology. Troponin reached a maximum value of 1.43, and was subsequently normal 05/03/2013. Active Problems:   Atrial fibrillation Continue heparin and amiodarone. Cardiology to consider TEE/DCCV.   Elevated LFTs Likely from passive congestion of the liver, improving.   Stage III CKD, AKI GFR 37-44.  Creatinine slightly worse after cath, with bump over the past 24 hours.  Gentle IVF started.   HIGH BLOOD PRESSURE / hypertension Blood pressure controlled.   Acute systolic CHF (congestive heart failure) / ischemic cardiomyopathy Status post 2-D echo on 05/02/2013. EF 20-25% with evidence of severe left ventricular and right ventricular failure. Continue medical therapy with ACE inhibitor and diuretics. Weight 53.1 kg, down from 53.4 kg yesterday.   COPD (chronic obstructive pulmonary disease) Continue duo nebs, IV steroids, supplemental oxygen as needed.  Solu-Medrol discontinued 05/05/2013.  Status post 5 days of Rocephin. Now on Prednisone taper.   Tobacco abuse Counseled on cessation. Poor insight.   History of atrial septal defect Cardiology following.   Mental retardation, idiopathic mild Nursing staff providing supportive care.   Acute respiratory failure Multifactorial with COPD exacerbation, acute on chronic systolic contributory. See above.  Code Status: Full. Family Communication: No family at bedside today. Disposition Plan: Home when stable.   IV access:  Peripheral IV  Medical Consultants:  Dr. Charlton Haws, Cardiology.  Other Consultants:  None.  Anti-infectives:  Rocephin 05/02/2013---> 05/06/2013  HPI/Subjective: Alexis Dillon had an episode of chest pain last PM, associated indigestion,cleared with cough.  No further chest pain.  No dyspnea, occasional cough.  Objective: Filed Vitals:   05/07/13 2003 05/08/13 0000 05/08/13 0238 05/08/13 0450  BP: 105/64 86/54 132/76   Pulse:      Temp: 97.9 F (36.6 C) 97.4 F (36.3 C) 97.4 F (36.3 C)   TempSrc: Oral Oral Oral   Resp:      Height:      Weight:    53.4 kg (117 lb 11.6 oz)  SpO2: 95% 95%      Intake/Output Summary (Last 24 hours) at 05/08/13 0709 Last data filed at 05/08/13 0000  Gross per 24 hour  Intake 1556.69 ml  Output    600 ml  Net 956.69 ml    Exam: Gen:  NAD Cardiovascular:  HSIR Respiratory:  Lungs clearer today Gastrointestinal:  Abdomen soft, NT/ND, + BS Extremities:  No C/E/C  Data Reviewed: Basic Metabolic Panel:  Recent Labs Lab 05/04/13 0520 05/05/13 0550 05/06/13 0527 05/07/13 0603 05/08/13 0452  NA 141 139 135 131* 133*  K 3.8 3.5 4.8 4.9 4.6  CL 99 97 93* 91* 90*  CO2 28 27 30 28 29   GLUCOSE 157* 105* 119* 122* 113*  BUN 34* 42* 40* 43* 51*  CREATININE 1.34* 1.45* 1.35* 1.46* 1.79*  CALCIUM 8.7 8.9 8.7 8.7 8.2*   GFR Estimated Creatinine Clearance: 25.4 ml/min (by C-G formula based on Cr of  1.79). Liver Function Tests:  Recent Labs Lab 05/02/13 0236 05/06/13 1100  AST 255* 35  ALT 175* 48*  ALKPHOS 192* 102  BILITOT 0.3 0.2*  PROT 7.1 6.4  ALBUMIN 4.0 3.5   CBC:  Recent Labs Lab 05/02/13 0236  05/04/13 0520 05/05/13 0550 05/06/13 0527 05/07/13 0603 05/08/13 0452  WBC 15.7*  < > 19.0* 17.3* 15.1* 14.7* 15.2*  NEUTROABS 13.3*  --   --   --   --   --   --   HGB 13.8  < > 12.7 14.3 14.1 15.1* 14.8  HCT 42.4  < > 38.5 43.2 42.6 45.6 44.0  MCV 95.9  < > 94.1 94.3 94.7 94.6 93.6  PLT 228  < > 221 269 231 267 278  < > = values in this interval not displayed. Cardiac Enzymes:  Recent Labs Lab 05/07/13 0603 05/07/13 1125 05/07/13 1647 05/07/13 2300 05/08/13 0452  TROPONINI <0.30 <0.30 <0.30 <0.30 <0.30   BNP (last 3 results)  Recent Labs  05/02/13 0236  PROBNP 11638.0*   Microbiology Recent Results (from the past 240 hour(s))  CULTURE, BLOOD (ROUTINE X 2)     Status: None   Collection Time    05/02/13  3:02 AM      Result Value Range Status   Specimen Description BLOOD RIGHT ANTECUBITAL   Final   Special Requests     Final   Value: BOTTLES DRAWN AEROBIC AND ANAEROBIC AEB=4CC ANA=2CC   Culture NO GROWTH 5 DAYS   Final   Report Status 05/07/2013 FINAL   Final  CULTURE, BLOOD (ROUTINE X 2)     Status: None   Collection Time    05/02/13  3:02 AM      Result Value Range Status   Specimen Description BLOOD RIGHT ANTECUBITAL   Final   Special Requests BOTTLES DRAWN AEROBIC ONLY 4CC   Final   Culture NO GROWTH 5 DAYS   Final   Report Status 05/07/2013 FINAL   Final  URINE CULTURE     Status: None   Collection Time    05/02/13  4:44 AM      Result Value Range Status   Specimen Description URINE, CLEAN CATCH   Final   Special Requests NONE   Final   Culture  Setup Time     Final   Value: 05/02/2013 10:41     Performed at [facility] Count     Final   Value: NO GROWTH     Performed at [facility]   Culture     Final    Value: NO GROWTH     Performed at [facility]   Report Status 05/03/2013 FINAL   Final  MRSA PCR SCREENING     Status: None   Collection Time    05/04/13 10:58 AM      Result Value Range Status   MRSA by PCR NEGATIVE  NEGATIVE Final   Comment:            The GeneXpert MRSA Assay (FDA     approved for NASAL specimens     only), is one component of a     comprehensive  MRSA colonization     surveillance program. It is not     intended to diagnose MRSA     infection nor to guide or     monitor treatment for     MRSA infections.     Procedures and Diagnostic Studies: Dg Chest Port 1 View  05/02/2013   CLINICAL DATA:  Cough and shortness of breath. Wheezing.  EXAM: PORTABLE CHEST - 1 VIEW  COMPARISON:  12/14/2006.  FINDINGS: Chronic cardiomegaly. Status post median sternotomy. New, diffuse interstitial opacity. Haziness of the lower chest, likely small effusions. No pneumothorax. Chronic pulmonary hyperinflation.  IMPRESSION: 1. CHF with trace pleural effusions. 2. COPD.   Electronically Signed   By: Tiburcio Pea M.D.   On: 05/02/2013 03:01   Mm Digital Screening  05/02/2013   CLINICAL DATA:  Screening.  EXAM: DIGITAL SCREENING BILATERAL MAMMOGRAM WITH CAD  COMPARISON:  Previous exam(s).  ACR Breast Density Category b: There are scattered areas of fibroglandular density.  FINDINGS: There are no findings suspicious for malignancy. Images were processed with CAD.  IMPRESSION: No mammographic evidence of malignancy. A result letter of this screening mammogram will be mailed directly to the patient.  RECOMMENDATION: Screening mammogram in one year. (Code:[id number]-B-01Y)  BI-RADS CATEGORY  1: Negative   Electronically Signed   By: Christiana Pellant M.D.   On: 05/02/2013 13:15    Scheduled Meds: . apixaban  2.5 mg Oral BID  . aspirin EC  81 mg Oral Daily  . atorvastatin  80 mg Oral q1800  . furosemide  40 mg Oral Daily  . guaiFENesin  600 mg Oral BID  . pantoprazole  40 mg Oral Daily   . polyethylene glycol  17 g Oral Daily  . predniSONE  20 mg Oral Q breakfast  . sodium chloride  3 mL Intravenous Q12H  . trandolapril  4 mg Oral Daily   Continuous Infusions: . sodium chloride 10 mL/hr at 05/07/13 1200  . sodium chloride Stopped (05/07/13 0700)  . sodium chloride 20 mL/hr at 05/08/13 0649  . amiodarone (NEXTERONE PREMIX) 360 mg/200 mL dextrose 30 mg/hr (05/08/13 0629)  . nitroGLYCERIN Stopped (05/04/13 1400)    Time spent: 25 minutes.    LOS: 6 days   RAMA,CHRISTINA  [facility] Pager [phone].   *Please note that the hospitalists switch teams on Wednesdays. Please call the flow manager at [phone] if you are having difficulty reaching the hospitalist taking care of this patient as she can update you and provide the most up-to-date pager number of provider caring for the patient. If 8PM-8AM, please contact night-coverage at [email or web address], password [facility]  05/08/2013, 7:09 AM

## 2013-05-08 NOTE — Interval H&P Note (Signed)
History and Physical Interval Note:  05/08/2013 3:28 PM  Alexis Dillon  has presented today for surgery, with the diagnosis of a-fib  The various methods of treatment have been discussed with the patient and family. After consideration of risks, benefits and other options for treatment, the patient has consented to  Procedure(s): TRANSESOPHAGEAL ECHOCARDIOGRAM (TEE) (N/A) CARDIOVERSION (N/A) as a surgical intervention .  The patient's history has been reviewed, patient examined, no change in status, stable for surgery.  I have reviewed the patient's chart and labs.  Questions were answered to the patient's satisfaction.     Daniel Bensimhon

## 2013-05-08 NOTE — Preoperative (Signed)
Beta Blockers   Reason not to administer Beta Blockers:Not Applicable 

## 2013-05-08 NOTE — Anesthesia Postprocedure Evaluation (Signed)
  Anesthesia Post-op Note  Patient: Alexis Dillon  Procedure(s) Performed: Procedure(s) with comments: TRANSESOPHAGEAL ECHOCARDIOGRAM (TEE) (N/A) CARDIOVERSION (N/A) - Dr. Adriana Reams verified with EP pt in a flutter...will cardiovert...  150 joules @ 4:40, using Propofol 40  mg/IV .Marland Kitchen.successful NSR   Patient Location: Endoscopy Unit  Anesthesia Type:MAC  Level of Consciousness: awake, alert  and oriented  Airway and Oxygen Therapy: Patient Spontanous Breathing and Patient connected to nasal cannula oxygen  Post-op Pain: none  Post-op Assessment: Post-op Vital signs reviewed, Patient's Cardiovascular Status Stable and Respiratory Function Stable  Post-op Vital Signs: Reviewed and stable  Complications: No apparent anesthesia complications

## 2013-05-08 NOTE — Progress Notes (Addendum)
    Subjective:  Feeling better. No chest pain or shortness of breath at rest.  Objective:  Vital Signs in the last 24 hours: Temp:  [97.4 F (36.3 C)-98.1 F (36.7 C)] 98.1 F (36.7 C) (12/23 0738) Pulse Rate:  [86-122] 91 (12/23 0738) BP: (86-148)/(54-88) 148/80 mmHg (12/23 0738) SpO2:  [93 %-96 %] 96 % (12/23 0738) Weight:  [117 lb 11.6 oz (53.4 kg)] 117 lb 11.6 oz (53.4 kg) (12/23 0450)  Intake/Output from previous day: 12/22 0701 - 12/23 0700 In: 2003.6 [P.O.:1040; I.V.:963.6] Out: 1300 [Urine:1300]  Physical Exam: Pt is alert and oriented, NAD HEENT: normal Neck: JVP - normal Lungs: decreased BS bilaterally CV: irregularly irregular without murmur or gallop Abd: soft, NT, Positive BS, no hepatomegaly Ext: no C/C/E, distal pulses intact and equal Skin: warm/dry no rash   Lab Results:  Recent Labs  05/07/13 0603 05/08/13 0452  WBC 14.7* 15.2*  HGB 15.1* 14.8  PLT 267 278    Recent Labs  05/07/13 0603 05/08/13 0452  NA 131* 133*  K 4.9 4.6  CL 91* 90*  CO2 28 29  GLUCOSE 122* 113*  BUN 43* 51*  CREATININE 1.46* 1.79*    Recent Labs  05/07/13 2300 05/08/13 0452  TROPONINI <0.30 <0.30    Tele: Atrial flutter, heart rate 80's  Assessment/Plan:  1. Acute systolic heart failure - LVEF less than 30% 2. Atrial flutter, new onset 3. ASD s/p closure 4. NSTEMI - multivessel CAD at cath with chronic occlusion of the OM and RCA, diffuse LAD stenosis. Med Rx planned - films reviewed. 5. Acute kidney injury - creatinine worse today. Hold mavik and lasix  Pt has been on IV heparin, now transitioned to Apixaban and has received 3 doses for TEE/cardioversion today. I have reviewed risks, indication, alternatives. I think important to attempt sinus rhythm considering her severe LV dysfunction. She would like to wait for her family member to arrive before proceeding with TEE/cardioversion, but she is agreeable with plan. Continue IV amio today and transition  to oral tomorrow.  Dispo: ST-SNF  Tonny Bollman, M.D. 05/08/2013, 8:16 AM

## 2013-05-08 NOTE — Progress Notes (Signed)
Echocardiogram Echocardiogram Transesophageal has been performed.  Cleburne Savini 05/08/2013, 4:16 PM

## 2013-05-09 ENCOUNTER — Encounter (HOSPITAL_COMMUNITY): Payer: Self-pay | Admitting: Cardiology

## 2013-05-09 DIAGNOSIS — E875 Hyperkalemia: Secondary | ICD-10-CM | POA: Diagnosis not present

## 2013-05-09 LAB — TROPONIN I
Troponin I: <0.3 ng/mL (ref <0.30)
Troponin I: <0.3 ng/mL (ref <0.30)

## 2013-05-09 LAB — CBC
MCHC: 33.3 g/dL (ref 30.0–36.0)
MCV: 94.4 fL (ref 78.0–100.0)
Platelets: 266 10*3/uL (ref 150–400)
RBC: 4.65 MIL/uL (ref 3.87–5.11)
RDW: 14 % (ref 11.5–15.5)
WBC: 14.4 10*3/uL — ABNORMAL HIGH (ref 4.0–10.5)

## 2013-05-09 LAB — PROTIME-INR
INR: 1 (ref 0.00–1.49)
Prothrombin Time: 13 seconds (ref 11.6–15.2)

## 2013-05-09 LAB — BASIC METABOLIC PANEL
Calcium: 8.5 mg/dL (ref 8.4–10.5)
Creatinine, Ser: 1.52 mg/dL — ABNORMAL HIGH (ref 0.50–1.10)
GFR calc Af Amer: 40 mL/min — ABNORMAL LOW (ref >90)
Glucose, Bld: 109 mg/dL — ABNORMAL HIGH (ref 70–99)
Potassium: 5.3 mEq/L — ABNORMAL HIGH (ref 3.5–5.1)
Sodium: 136 mEq/L (ref 135–145)

## 2013-05-09 MED ORDER — TRANDOLAPRIL 2 MG PO TABS
2.0000 mg | ORAL_TABLET | Freq: Every day | ORAL | Status: DC
  Administered 2013-05-09 – 2013-05-11 (×3): 2 mg via ORAL
  Filled 2013-05-09 (×3): qty 1

## 2013-05-09 MED ORDER — AMIODARONE HCL 200 MG PO TABS
200.0000 mg | ORAL_TABLET | Freq: Two times a day (BID) | ORAL | Status: DC
  Administered 2013-05-09 – 2013-05-11 (×5): 200 mg via ORAL
  Filled 2013-05-09 (×6): qty 1

## 2013-05-09 MED ORDER — SENNA 8.6 MG PO TABS
2.0000 | ORAL_TABLET | Freq: Every day | ORAL | Status: DC
  Administered 2013-05-09 – 2013-05-10 (×2): 17.2 mg via ORAL
  Filled 2013-05-09 (×3): qty 2

## 2013-05-09 MED ORDER — POLYETHYLENE GLYCOL 3350 17 G PO PACK
17.0000 g | PACK | Freq: Two times a day (BID) | ORAL | Status: DC
  Administered 2013-05-09 – 2013-05-10 (×3): 17 g via ORAL
  Filled 2013-05-09 (×6): qty 1

## 2013-05-09 MED ORDER — SODIUM POLYSTYRENE SULFONATE 15 GM/60ML PO SUSP
30.0000 g | Freq: Once | ORAL | Status: AC
  Administered 2013-05-09: 30 g via ORAL
  Filled 2013-05-09: qty 120

## 2013-05-09 NOTE — Progress Notes (Signed)
Physical Therapy Treatment Patient Details Name: Alexis Dillon MRN: [id number] DOB: [birth year] Today's Date: 05/09/2013 Time: 3016-0109 PT Time Calculation (min): 25 min  PT Assessment / Plan / Recommendation  History of Present Illness Alexis Dillon is a 67 y.o. female with a history of mild mental retardation, COPD not on home 02, current tobacco abuse (50 pack years), hypertension, and ASD with closure late in life who was transferred from [facility] for SOB x 3 days. She was admitted last night around 7 pm with worsening productive cough and dyspnea. She was found to have an elevated troponin and abnormal EKG with ST changes and was transferred to [facility].    PT Comments   Pt progressing with mobility and able to ambulate much further today with increased stability with RW and no noted pain in leg today at all. Pt requires cues for safety with mobility and states sister works and is therefore very receptive to ST-SNF to return to [facility] before home. Will continue to follow and recommend daily ambulation with nursing  Follow Up Recommendations        Does the patient have the potential to tolerate intense rehabilitation     Barriers to Discharge        Equipment Recommendations       Recommendations for Other Services    Frequency     Progress towards PT Goals Progress towards PT goals: Goals met and updated - see care plan  Plan Current plan remains appropriate    Precautions / Restrictions Precautions Precautions: Fall   Pertinent Vitals/Pain HR 72-78   Mobility  Bed Mobility Bed Mobility: Supine to Sit;Sitting - Scoot to Edge of Bed Supine to Sit: 6: Modified independent (Device/Increase time);HOB flat Sitting - Scoot to Edge of Bed: 6: Modified independent (Device/Increase time) Transfers Sit to Stand: 5: Supervision;From bed Stand to Sit: To chair/3-in-1;With armrests Details for Transfer Assistance: cues to stand for hand placement Ambulation/Gait Ambulation/Gait  Assistance: 5: Supervision Ambulation Distance (Feet): 400 Feet Assistive device: Rolling walker Ambulation/Gait Assistance Details: cues for position in RW and directional cues to room Gait Pattern: Step-through pattern;Decreased stride length Gait velocity: decreased Stairs: No    Exercises General Exercises - Lower Extremity Long Arc Quad: AROM;Both;20 reps;Seated Hip Flexion/Marching: AROM;Seated;Both;20 reps Toe Raises: AROM;Seated;Both;20 reps Heel Raises: AROM;Seated;Both;20 reps   PT Diagnosis:    PT Problem List:   PT Treatment Interventions:     PT Goals (current goals can now be found in the care plan section)    Visit Information  Last PT Received On: 05/09/13 Assistance Needed: +1 History of Present Illness: Alexis Dillon is a 67 y.o. female with a history of mild mental retardation, COPD not on home 02, current tobacco abuse (50 pack years), hypertension, and ASD with closure late in life who was transferred from [facility] for SOB x 3 days. She was admitted last night around 7 pm with worsening productive cough and dyspnea. She was found to have an elevated troponin and abnormal EKG with ST changes and was transferred to [facility].     Subjective Data      Cognition  Cognition Arousal/Alertness: Awake/alert Behavior During Therapy: WFL for tasks assessed/performed Overall Cognitive Status: History of cognitive impairments - at baseline    Balance     End of Session PT - End of Session Activity Tolerance: Patient tolerated treatment well Patient left: in chair;with call bell/phone within reach;with nursing/sitter in room Nurse Communication: Mobility status   GP  Toney Sang Beth 05/09/2013, 12:14 PM [name], PT [phone]

## 2013-05-09 NOTE — Progress Notes (Signed)
    Subjective:  Complains of nausea. No chest pain or dyspnea.   Objective:  Vital Signs in the last 24 hours: Temp:  [97.1 F (36.2 C)-97.8 F (36.6 C)] 97.7 F (36.5 C) (12/24 1136) Pulse Rate:  [5-95] 63 (12/23 1720) Resp:  [15-27] 16 (12/24 1136) BP: (102-167)/(50-90) 155/74 mmHg (12/24 0800) SpO2:  [93 %-100 %] 93 % (12/24 1136) FiO2 (%):  [9 %] 9 % (12/23 1647) Weight:  [99 lb 3.3 oz (45 kg)] 99 lb 3.3 oz (45 kg) (12/24 0459)  Intake/Output from previous day: 12/23 0701 - 12/24 0700 In: 1420.8 [P.O.:620; I.V.:800.8] Out: 1150 [Urine:1150]  Physical Exam: Pt is alert and oriented, NAD HEENT: normal Neck: JVP - normal, carotids 2+= without bruits Lungs: diffuse rhonchi CV: RRR without murmur or gallop Abd: soft, NT, Positive BS, no hepatomegaly Ext: no C/C/E, distal pulses intact and equal Skin: warm/dry no rash   Lab Results:  Recent Labs  05/08/13 0452 05/09/13 0340  WBC 15.2* 14.4*  HGB 14.8 14.6  PLT 278 266    Recent Labs  05/08/13 0452 05/09/13 0340  NA 133* 136  K 4.6 5.3*  CL 90* 96  CO2 29 29  GLUCOSE 113* 109*  BUN 51* 44*  CREATININE 1.79* 1.52*    Recent Labs  05/09/13 0350 05/09/13 0930  TROPONINI <0.30 <0.30    Cardiac Studies: TEE findings reviewed.   Tele: Sinus rhythm. Personally reviewed.  Assessment/Plan:  1. Acute systolic heart failure - LVEF less than 30%  2. Atrial flutter, new onset - now in sinus after TEE/CV 3. ASD s/p closure  4. NSTEMI - multivessel CAD at cath with chronic occlusion of the OM and RCA, diffuse LAD stenosis. Med Rx planned - films reviewed.  5. Acute kidney injury - creatinine improved after holding mavik and lasix  Overall improving. Will stop IV amio and start amiodarone 200 mg BID x 2 weeks then 200 mg daily thereafter. Resume Mavik. Hold on diuretics for now as she does not appear volume overloaded. Would anticipate that she could be discharge to ST-SNF later this week.  Tonny Bollman, M.D. 05/09/2013, 2:08 PM

## 2013-05-09 NOTE — Progress Notes (Signed)
CSW received call from pt's sister-in-law Louie Casa stating the plan is now to go to [facility] for rehab. Louie Casa is going to call facility to inform them pt will be discharging to this facility when medically ready.   Maryclare Labrador, MSW, [facility] Clinical Social Worker [phone]

## 2013-05-09 NOTE — Progress Notes (Addendum)
[facility] PROGRESS NOTE   Alexis Dillon WUJ:[id number] DOB: 25-Jun-1945 DOA: 05/02/2013 PCP: Fredirick Maudlin, MD  Brief narrative: Alexis Dillon is an 67 y.o. female with a PMH of mild mental retardation, COPD, current tobacco abuse, hypertension, atrial septal defect with closure late in life, was transferred from [facility] for evaluation of ongoing shortness of breath after she was discovered to have elevated troponins and an abnormal EKG with ST changes. She has been evaluated by cardiology.  Assessment/Plan: Principal Problem:   Acute coronary syndrome/NSTEMI The patient was admitted and evaluated by cardiology. 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 Cardiology recommends medical management.  Continue aspirin and statins. Troponin reached a maximum value of 1.43, and was subsequently normal 05/03/2013. ACE inhibitor/Mavik held secondary to AKI.  Active Problems:   Atrial fibrillation/flutter-new onset Patient was initially placed on heparin and amiodarone. Cardiology consulted and successfully performed TEE guided [facility] on 05/08/13. Patient is now placed on Apixaban. She is maintaining sinus rhythm/sinus bradycardia. Patient remains of Amiodarone drip- defer Mx to Cardiology.    Elevated LFTs Likely from passive congestion of the liver, improving.    Stage III CKD, AKI GFR 37-44.  Patient admitted with creatinine of 1.27 which peaked to 1.79 on 12/23. Lasix and ACE inhibitors temporarily held. Creatinine has improved to 1.52. Follow BMP in a.m.  Mild hyperkalemia - Not on potassium supplements.? Secondary to acute kidney injury. We'll provide a dose of Kayexalate and follow BMP in a.m.    HIGH BLOOD PRESSURE / hypertension Blood pressure controlled.    Acute systolic CHF (congestive heart failure) / ischemic  cardiomyopathy Status post 2-D echo on 05/02/2013. EF 20-25% with evidence of severe left ventricular and right ventricular failure. Continue medical therapy Lasix and ACE inhibitors temporarily held secondary to acute kidney injury.   COPD (chronic obstructive pulmonary disease) Continue duo nebs, prednisone, supplemental oxygen as needed. Solu-Medrol discontinued 05/05/2013.  Status post 5 days of Rocephin. Now on Prednisone taper. Stable.    Tobacco abuse Counseled on cessation. Poor insight.    History of atrial septal defect Cardiology following. Status post closure    Mental retardation, idiopathic mild Nursing staff providing supportive care.    Acute respiratory failure Multifactorial with COPD exacerbation, acute on chronic systolic contributory. See above. - Improved/resolved.  Code Status: Full. Family Communication: No family at bedside today. Disposition Plan: SNF when medically stable-pending cardiology clearance   IV access:  Peripheral IV  Medical Consultants:  Cardiology.  Other Consultants:  None.  Anti-infectives:  Rocephin 05/02/2013---> 05/06/2013  HPI/Subjective: Alexis Dillon seen this morning-"just woke up". Denies complaints except constipation-states no BM for 4 days. Denies dyspnea, chest pain or palpitations.  Objective: Filed Vitals:   05/09/13 0459 05/09/13 0720 05/09/13 0800 05/09/13 1136  BP:   155/74   Pulse:      Temp:  97.6 F (36.4 C)  97.7 F (36.5 C)  TempSrc:  Oral  Oral  Resp:  16  16  Height:      Weight: 45 kg (99 lb 3.3 oz)     SpO2:  98%  93%    Intake/Output Summary (Last 24 hours) at 05/09/13 1339 Last data filed at 05/09/13 1000  Gross per 24 hour  Intake 1166.7 ml  Output    500 ml  Net  666.7 ml    Exam: Gen:  NAD  Cardiovascular:  S1 and S2 heard, RRR. No JVD, murmurs or pedal edema. Telemetry shows sinus rhythm with first degree AV block-sinus bradycardia in the 50s. Respiratory:  Clear to  auscultation. No increased work of breathing. Gastrointestinal:  Abdomen soft, NT/ND, + BS Extremities:  No C/E/C CNS: Alert and oriented. No focal neurological deficits  Data Reviewed: Basic Metabolic Panel:  Recent Labs Lab 05/05/13 0550 05/06/13 0527 05/07/13 0603 05/08/13 0452 05/09/13 0340  NA 139 135 131* 133* 136  K 3.5 4.8 4.9 4.6 5.3*  CL 97 93* 91* 90* 96  CO2 27 30 28 29 29   GLUCOSE 105* 119* 122* 113* 109*  BUN 42* 40* 43* 51* 44*  CREATININE 1.45* 1.35* 1.46* 1.79* 1.52*  CALCIUM 8.9 8.7 8.7 8.2* 8.5   GFR Estimated Creatinine Clearance: 25.2 ml/min (by C-G formula based on Cr of 1.52). Liver Function Tests:  Recent Labs Lab 05/06/13 1100  AST 35  ALT 48*  ALKPHOS 102  BILITOT 0.2*  PROT 6.4  ALBUMIN 3.5   CBC:  Recent Labs Lab 05/05/13 0550 05/06/13 0527 05/07/13 0603 05/08/13 0452 05/09/13 0340  WBC 17.3* 15.1* 14.7* 15.2* 14.4*  HGB 14.3 14.1 15.1* 14.8 14.6  HCT 43.2 42.6 45.6 44.0 43.9  MCV 94.3 94.7 94.6 93.6 94.4  PLT 269 231 267 278 266   Cardiac Enzymes:  Recent Labs Lab 05/08/13 1038 05/08/13 1828 05/08/13 2220 05/09/13 0350 05/09/13 0930  TROPONINI <0.30 <0.30 <0.30 <0.30 <0.30   BNP (last 3 results)  Recent Labs  05/02/13 0236  PROBNP 11638.0*   Microbiology Recent Results (from the past 240 hour(s))  CULTURE, BLOOD (ROUTINE X 2)     Status: None   Collection Time    05/02/13  3:02 AM      Result Value Range Status   Specimen Description BLOOD RIGHT ANTECUBITAL   Final   Special Requests     Final   Value: BOTTLES DRAWN AEROBIC AND ANAEROBIC AEB=4CC ANA=2CC   Culture NO GROWTH 5 DAYS   Final   Report Status 05/07/2013 FINAL   Final  CULTURE, BLOOD (ROUTINE X 2)     Status: None   Collection Time    05/02/13  3:02 AM      Result Value Range Status   Specimen Description BLOOD RIGHT ANTECUBITAL   Final   Special Requests BOTTLES DRAWN AEROBIC ONLY 4CC   Final   Culture NO GROWTH 5 DAYS   Final   Report  Status 05/07/2013 FINAL   Final  URINE CULTURE     Status: None   Collection Time    05/02/13  4:44 AM      Result Value Range Status   Specimen Description URINE, CLEAN CATCH   Final   Special Requests NONE   Final   Culture  Setup Time     Final   Value: 05/02/2013 10:41     Performed at [facility] Count     Final   Value: NO GROWTH     Performed at [facility]   Culture     Final   Value: NO GROWTH     Performed at [facility]   Report Status 05/03/2013 FINAL   Final  MRSA PCR SCREENING     Status: None   Collection Time    05/04/13 10:58 AM      Result Value Range Status   MRSA by PCR NEGATIVE  NEGATIVE Final   Comment:  The GeneXpert MRSA Assay (FDA     approved for NASAL specimens     only), is one component of a     comprehensive MRSA colonization     surveillance program. It is not     intended to diagnose MRSA     infection nor to guide or     monitor treatment for     MRSA infections.     Procedures and Diagnostic Studies: Dg Chest Port 1 View  05/02/2013   CLINICAL DATA:  Cough and shortness of breath. Wheezing.  EXAM: PORTABLE CHEST - 1 VIEW  COMPARISON:  12/14/2006.  FINDINGS: Chronic cardiomegaly. Status post median sternotomy. New, diffuse interstitial opacity. Haziness of the lower chest, likely small effusions. No pneumothorax. Chronic pulmonary hyperinflation.  IMPRESSION: 1. CHF with trace pleural effusions. 2. COPD.   Electronically Signed   By: Tiburcio Pea M.D.   On: 05/02/2013 03:01   Mm Digital Screening  05/02/2013   CLINICAL DATA:  Screening.  EXAM: DIGITAL SCREENING BILATERAL MAMMOGRAM WITH CAD  COMPARISON:  Previous exam(s).  ACR Breast Density Category b: There are scattered areas of fibroglandular density.  FINDINGS: There are no findings suspicious for malignancy. Images were processed with CAD.  IMPRESSION: No mammographic evidence of malignancy. A result letter of this screening mammogram will  be mailed directly to the patient.  RECOMMENDATION: Screening mammogram in one year. (Code:SM-B-01Y)  BI-RADS CATEGORY  1: Negative   Electronically Signed   By: Christiana Pellant M.D.   On: 05/02/2013 13:15    Scheduled Meds: . apixaban  2.5 mg Oral BID  . aspirin EC  81 mg Oral Daily  . atorvastatin  80 mg Oral q1800  . guaiFENesin  600 mg Oral BID  . pantoprazole  40 mg Oral Daily  . polyethylene glycol  17 g Oral BID  . predniSONE  20 mg Oral Q breakfast  . senna  2 tablet Oral Daily  . sodium chloride  3 mL Intravenous Q12H   Continuous Infusions: . sodium chloride 10 mL/hr at 05/08/13 1900  . sodium chloride Stopped (05/07/13 0700)  . amiodarone (NEXTERONE PREMIX) 360 mg/200 mL dextrose 30 mg/hr (05/08/13 1900)  . nitroGLYCERIN Stopped (05/04/13 1400)    Time spent: 25 minutes.    LOS: 7 days   [facility]  [facility] Pager [phone].   *Please note that the hospitalists switch teams on Wednesdays. Please call the flow manager at [phone] if you are having difficulty reaching the hospitalist taking care of this patient as she can update you and provide the most up-to-date pager number of provider caring for the patient. If 8PM-8AM, please contact night-coverage at [email or web address], password [facility]  05/09/2013, 1:39 PM

## 2013-05-10 DIAGNOSIS — I2589 Other forms of chronic ischemic heart disease: Secondary | ICD-10-CM

## 2013-05-10 DIAGNOSIS — R7989 Other specified abnormal findings of blood chemistry: Secondary | ICD-10-CM

## 2013-05-10 LAB — CBC
HCT: 42.7 % (ref 36.0–46.0)
MCH: 31.3 pg (ref 26.0–34.0)
MCV: 94.1 fL (ref 78.0–100.0)
Platelets: 262 10*3/uL (ref 150–400)
RBC: 4.54 MIL/uL (ref 3.87–5.11)

## 2013-05-10 LAB — BASIC METABOLIC PANEL
BUN: 37 mg/dL — ABNORMAL HIGH (ref 6–23)
CO2: 30 mEq/L (ref 19–32)
Calcium: 8.4 mg/dL (ref 8.4–10.5)
Chloride: 96 mEq/L (ref 96–112)
Creatinine, Ser: 1.4 mg/dL — ABNORMAL HIGH (ref 0.50–1.10)
Glucose, Bld: 103 mg/dL — ABNORMAL HIGH (ref 70–99)
Potassium: 4.9 mEq/L (ref 3.5–5.1)
Sodium: 137 mEq/L (ref 135–145)

## 2013-05-10 LAB — TROPONIN I
Troponin I: <0.3 ng/mL (ref <0.30)
Troponin I: <0.3 ng/mL (ref <0.30)

## 2013-05-10 MED ORDER — ALBUTEROL SULFATE (5 MG/ML) 0.5% IN NEBU: 2.5000 mg | INHALATION_SOLUTION | RESPIRATORY_TRACT | Status: DC | PRN

## 2013-05-10 NOTE — Progress Notes (Signed)
The patient was seen and examined, and I agree with the assessment and plan as documented above, with modifications as noted below. Pt appears to be euvolemic and stable. Maintaining normal sinus rhythm on amiodarone and anticoagulated with apixaban. Renal function is gradually improving. Continue ASA, statin, and trandolapril.

## 2013-05-10 NOTE — Progress Notes (Signed)
Consulting cardiologist: Excell Seltzer  Subjective:    Feeling fine. Breathing is some better. Trouble sleeping at night.    Objective:   Temp:  [97.3 F (36.3 C)-98.5 F (36.9 C)] 97.3 F (36.3 C) (12/25 0754) Resp:  [16-18] 18 (12/24 2350) BP: (130-149)/(64-86) 139/86 mmHg (12/25 0754) SpO2:  [90 %-99 %] 98 % (12/25 0754) Weight:  [98 lb 8.7 oz (44.7 kg)] 98 lb 8.7 oz (44.7 kg) (12/25 0417) Last BM Date: 05/09/13  Filed Weights   05/08/13 0450 05/09/13 0459 05/10/13 0417  Weight: 117 lb 11.6 oz (53.4 kg) 99 lb 3.3 oz (45 kg) 98 lb 8.7 oz (44.7 kg)    Intake/Output Summary (Last 24 hours) at 05/10/13 0813 Last data filed at 05/10/13 0000  Gross per 24 hour  Intake 1407.9 ml  Output    475 ml  Net  932.9 ml    Telemetry: NSR with PVC's. Rate in the 60's. Exam:  General: No acute distress.  HEENT: Conjunctiva and lids normal, oropharynx clear.  Lungs: Coarse breath sounds, no wheezes or rhonchi. Continues occasional coughing.  Cardiac: No elevated JVP or bruits.  RRR without MRG. Occasional extra systole. Lungs sounds obscure heart sounds.   Abdomen: Normoactive bowel sounds, nontender, nondistended.  Extremities: No pitting edema, distal pulses full. Laceration to the left pre-tibial area. (She did this prior to admission).  Neuropsychiatric: Alert and oriented x3, affect appropriate.   Lab Results:  Basic Metabolic Panel:  Recent Labs Lab 05/08/13 0452 05/09/13 0340 05/10/13 0547  NA 133* 136 137  K 4.6 5.3* 4.9  CL 90* 96 96  CO2 29 29 30   GLUCOSE 113* 109* 103*  BUN 51* 44* 37*  CREATININE 1.79* 1.52* 1.40*  CALCIUM 8.2* 8.5 8.4    Liver Function Tests:  Recent Labs Lab 05/06/13 1100  AST 35  ALT 48*  ALKPHOS 102  BILITOT 0.2*  PROT 6.4  ALBUMIN 3.5    CBC:  Recent Labs Lab 05/08/13 0452 05/09/13 0340 05/10/13 0547  WBC 15.2* 14.4* 14.1*  HGB 14.8 14.6 14.2  HCT 44.0 43.9 42.7  MCV 93.6 94.4 94.1  PLT 278 266 262     Cardiac Enzymes:  Recent Labs Lab 05/09/13 1730 05/09/13 2219 05/10/13 0547  TROPONINI <0.30 <0.30 <0.30    BNP:  Recent Labs  05/02/13 0236  PROBNP 11638.0*    Coagulation:  Recent Labs Lab 05/08/13 0452 05/09/13 0340 05/10/13 0547  INR 1.04 1.00 1.03     Medications:   Scheduled Medications: . amiodarone  200 mg Oral BID  . apixaban  2.5 mg Oral BID  . aspirin EC  81 mg Oral Daily  . atorvastatin  80 mg Oral q1800  . guaiFENesin  600 mg Oral BID  . pantoprazole  40 mg Oral Daily  . polyethylene glycol  17 g Oral BID  . predniSONE  20 mg Oral Q breakfast  . senna  2 tablet Oral Daily  . sodium chloride  3 mL Intravenous Q12H  . trandolapril  2 mg Oral Daily     Infusions: . sodium chloride 10 mL/hr at 05/09/13 0700     PRN Medications:  sodium chloride, acetaminophen, albuterol, ipratropium, ondansetron (ZOFRAN) IV, sodium chloride   Assessment and Plan:   1. Acute on Chronic Systolic CHF: EF of 30%. Continues some lung congestion, but weight it down 9 lbs from highest recorded wt. Not on diuretics at this time. Etiology related to atrial flutter.   2. Atrial flutter: S/P  TEE/CV : 05/08/2013. Remains in NSR with occasional PVC's. On amiodarone load, with 200 mg BID now and will be for two weeks total, and then 200 mg daily thereafter. She is also on apixaban. CHADS score of 3 for HF , Hypertension and Age.  3. ASD: Closure by history at the age of 33.  4. NSTEMI: Multivessel CAD s/p cath 12.19/2014 with 3 vessel disease with medical management per note due to co-morbidities and family request after lengthy discussion with Dr. Antoine Poche. Due to CKD she is off of ACE and Lasix.  She is planned for SNF tomorrow and can move to telemetry if bed is needed.  Bettey Mare. Lyman Bishop NP [facility] 05/10/2013, 8:13 AM

## 2013-05-10 NOTE — Progress Notes (Signed)
[facility] PROGRESS NOTE   Alexis Dillon WUJ:[id number] DOB: 05-Jan-1946 DOA: 05/02/2013 PCP: Fredirick Maudlin, MD  Brief narrative: Alexis Dillon is an 67 y.o. female with a PMH of mild mental retardation, COPD, current tobacco abuse, hypertension, atrial septal defect with closure late in life, was transferred from [facility] for evaluation of ongoing shortness of breath after she was discovered to have elevated troponins and an abnormal EKG with ST changes. She has been evaluated by cardiology.  Assessment/Plan: Principal Problem:   Acute coronary syndrome/NSTEMI The patient was admitted and evaluated by cardiology. 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 Cardiology recommends medical management per note due to co-morbidities and family request after lengthy discussion with Dr. Antoine Poche. Continue aspirin and statins. Troponin reached a maximum value of 1.43, and was subsequently normal 05/03/2013. ACE inhibitor/Mavik temporarily held secondary to AKI but has been resumed.  Active Problems:   Atrial fibrillation/flutter-new onset Patient was initially placed on heparin and amiodarone. Cardiology consulted and successfully performed TEE guided [facility] on 05/08/13. Patient is now placed on Apixaban. She is maintaining sinus rhythm. She was initially placed on amiodarone drip which was switched on 12/24 to by mouth amiodarone. She is to be on 200 mg by mouth twice a day for 2 weeks total and then reduce to 200 mg daily. CHADS score of 3 for HF , Hypertension and Age.    Elevated LFTs Likely from passive congestion of the liver, improving.    Stage III CKD, AKI GFR 37-44.  Patient admitted with creatinine of 1.27 which peaked to 1.79 on 12/23. Lasix and ACE inhibitors temporarily held. Creatinine has improved to 1.4. Follow BMP in  a.m.  Mild hyperkalemia 12/24 - Not on potassium supplements.? Secondary to acute kidney injury. Resolved after a dose of Kayexalate.    HIGH BLOOD PRESSURE / hypertension Blood pressure controlled.    Acute systolic CHF (congestive heart failure) / ischemic cardiomyopathy Status post 2-D echo on 05/02/2013. EF 20-25% with evidence of severe left ventricular and right ventricular failure. Continue medical therapy- Lasix and ACE inhibitors temporarily held secondary to acute kidney injury. Euvolemic-continue to hold Lasix. ACE inhibitor has been resumed.  COPD (chronic obstructive pulmonary disease) Continue duo nebs, prednisone, supplemental oxygen as needed. Solu-Medrol discontinued 05/05/2013.  Status post 5 days of Rocephin. Now on Prednisone taper. Stable.    Tobacco abuse Counseled on cessation. Poor insight.    History of atrial septal defect Cardiology following. Status post closure in 50's    Mental retardation, idiopathic mild Nursing staff providing supportive care.    Acute respiratory failure Multifactorial with COPD exacerbation, acute on chronic systolic contributory. See above. - Improved/resolved.  Code Status: Full. Family Communication: No family at bedside today. Disposition Plan: SNF possibly 12/26. Transfer from stepped down to telemetry bed.   IV access:  Peripheral IV  Medical Consultants:  Cardiology.  Other Consultants:  None.  Anti-infectives:  Rocephin 05/02/2013---> 05/06/2013  HPI/Subjective: Alexis Dillon states that she had some nausea and?? Vomiting yesterday. She did have BM yesterday. Denies dyspnea or chest pain.  Objective: Filed Vitals:   05/09/13 2350 05/10/13 0416 05/10/13 0417 05/10/13 0754  BP: 130/64 149/73  139/86  Pulse:      Temp: 98 F (36.7 C)   97.3 F (36.3 C)  TempSrc: Oral   Oral  Resp: 18     Height:  Weight:   44.7 kg (98 lb 8.7 oz)   SpO2: 99%   98%    Intake/Output Summary (Last 24 hours) at  05/10/13 1027 Last data filed at 05/10/13 0000  Gross per 24 hour  Intake  848.5 ml  Output    475 ml  Net  373.5 ml    Exam: Gen:  NAD Cardiovascular:  S1 and S2 heard, RRR. No JVD, murmurs or pedal edema. Telemetry shows sinus rhythm. Respiratory:  Clear to auscultation. No increased work of breathing. Gastrointestinal:  Abdomen soft, NT/ND, + BS Extremities:  No C/E/C CNS: Alert and oriented. No focal neurological deficits  Data Reviewed: Basic Metabolic Panel:  Recent Labs Lab 05/06/13 0527 05/07/13 0603 05/08/13 0452 05/09/13 0340 05/10/13 0547  NA 135 131* 133* 136 137  K 4.8 4.9 4.6 5.3* 4.9  CL 93* 91* 90* 96 96  CO2 30 28 29 29 30   GLUCOSE 119* 122* 113* 109* 103*  BUN 40* 43* 51* 44* 37*  CREATININE 1.35* 1.46* 1.79* 1.52* 1.40*  CALCIUM 8.7 8.7 8.2* 8.5 8.4   GFR Estimated Creatinine Clearance: 27.1 ml/min (by C-G formula based on Cr of 1.4). Liver Function Tests:  Recent Labs Lab 05/06/13 1100  AST 35  ALT 48*  ALKPHOS 102  BILITOT 0.2*  PROT 6.4  ALBUMIN 3.5   CBC:  Recent Labs Lab 05/06/13 0527 05/07/13 0603 05/08/13 0452 05/09/13 0340 05/10/13 0547  WBC 15.1* 14.7* 15.2* 14.4* 14.1*  HGB 14.1 15.1* 14.8 14.6 14.2  HCT 42.6 45.6 44.0 43.9 42.7  MCV 94.7 94.6 93.6 94.4 94.1  PLT 231 267 278 266 262   Cardiac Enzymes:  Recent Labs Lab 05/09/13 0350 05/09/13 0930 05/09/13 1730 05/09/13 2219 05/10/13 0547  TROPONINI <0.30 <0.30 <0.30 <0.30 <0.30   BNP (last 3 results)  Recent Labs  05/02/13 0236  PROBNP 11638.0*   Microbiology Recent Results (from the past 240 hour(s))  CULTURE, BLOOD (ROUTINE X 2)     Status: None   Collection Time    05/02/13  3:02 AM      Result Value Range Status   Specimen Description BLOOD RIGHT ANTECUBITAL   Final   Special Requests     Final   Value: BOTTLES DRAWN AEROBIC AND ANAEROBIC AEB=4CC ANA=2CC   Culture NO GROWTH 5 DAYS   Final   Report Status 05/07/2013 FINAL   Final  CULTURE,  BLOOD (ROUTINE X 2)     Status: None   Collection Time    05/02/13  3:02 AM      Result Value Range Status   Specimen Description BLOOD RIGHT ANTECUBITAL   Final   Special Requests BOTTLES DRAWN AEROBIC ONLY 4CC   Final   Culture NO GROWTH 5 DAYS   Final   Report Status 05/07/2013 FINAL   Final  URINE CULTURE     Status: None   Collection Time    05/02/13  4:44 AM      Result Value Range Status   Specimen Description URINE, CLEAN CATCH   Final   Special Requests NONE   Final   Culture  Setup Time     Final   Value: 05/02/2013 10:41     Performed at [facility] Count     Final   Value: NO GROWTH     Performed at [facility]   Culture     Final   Value: NO GROWTH     Performed at [facility]   Report Status 05/03/2013 FINAL   Final  MRSA PCR SCREENING     Status: None   Collection Time    05/04/13 10:58 AM      Result Value Range Status   MRSA by PCR NEGATIVE  NEGATIVE Final   Comment:            The GeneXpert MRSA Assay (FDA     approved for NASAL specimens     only), is one component of a     comprehensive MRSA colonization     surveillance program. It is not     intended to diagnose MRSA     infection nor to guide or     monitor treatment for     MRSA infections.     Procedures and Diagnostic Studies: Dg Chest Port 1 View  05/02/2013   CLINICAL DATA:  Cough and shortness of breath. Wheezing.  EXAM: PORTABLE CHEST - 1 VIEW  COMPARISON:  12/14/2006.  FINDINGS: Chronic cardiomegaly. Status post median sternotomy. New, diffuse interstitial opacity. Haziness of the lower chest, likely small effusions. No pneumothorax. Chronic pulmonary hyperinflation.  IMPRESSION: 1. CHF with trace pleural effusions. 2. COPD.   Electronically Signed   By: Tiburcio Pea M.D.   On: 05/02/2013 03:01   Mm Digital Screening  05/02/2013   CLINICAL DATA:  Screening.  EXAM: DIGITAL SCREENING BILATERAL MAMMOGRAM WITH CAD  COMPARISON:  Previous exam(s).  ACR  Breast Density Category b: There are scattered areas of fibroglandular density.  FINDINGS: There are no findings suspicious for malignancy. Images were processed with CAD.  IMPRESSION: No mammographic evidence of malignancy. A result letter of this screening mammogram will be mailed directly to the patient.  RECOMMENDATION: Screening mammogram in one year. (Code:SM-B-01Y)  BI-RADS CATEGORY  1: Negative   Electronically Signed   By: Christiana Pellant M.D.   On: 05/02/2013 13:15    Scheduled Meds: . amiodarone  200 mg Oral BID  . apixaban  2.5 mg Oral BID  . aspirin EC  81 mg Oral Daily  . atorvastatin  80 mg Oral q1800  . guaiFENesin  600 mg Oral BID  . pantoprazole  40 mg Oral Daily  . polyethylene glycol  17 g Oral BID  . predniSONE  20 mg Oral Q breakfast  . senna  2 tablet Oral Daily  . sodium chloride  3 mL Intravenous Q12H  . trandolapril  2 mg Oral Daily   Continuous Infusions: . sodium chloride 10 mL/hr at 05/09/13 0700    Time spent: 25 minutes.    LOS: 8 days   [facility]  [facility] Pager [phone].   *Please note that the hospitalists switch teams on Wednesdays. Please call the flow manager at [phone] if you are having difficulty reaching the hospitalist taking care of this patient as she can update you and provide the most up-to-date pager number of provider caring for the patient. If 8PM-8AM, please contact night-coverage at [email or web address], password [facility]  05/10/2013, 10:27 AM

## 2013-05-11 ENCOUNTER — Inpatient Hospital Stay (HOSPITAL_COMMUNITY): Payer: Medicare Other

## 2013-05-11 LAB — BASIC METABOLIC PANEL
BUN: 35 mg/dL — ABNORMAL HIGH (ref 6–23)
CO2: 31 mEq/L (ref 19–32)
Calcium: 8.3 mg/dL — ABNORMAL LOW (ref 8.4–10.5)
Chloride: 94 mEq/L — ABNORMAL LOW (ref 96–112)
Glucose, Bld: 105 mg/dL — ABNORMAL HIGH (ref 70–99)
Potassium: 4.8 mEq/L (ref 3.5–5.1)
Sodium: 136 mEq/L (ref 135–145)

## 2013-05-11 LAB — URINALYSIS, ROUTINE W REFLEX MICROSCOPIC
Bilirubin Urine: NEGATIVE
Glucose, UA: NEGATIVE mg/dL
Ketones, ur: NEGATIVE mg/dL
Leukocytes, UA: NEGATIVE
Nitrite: NEGATIVE
Specific Gravity, Urine: 1.009 (ref 1.005–1.030)
pH: 7.5 (ref 5.0–8.0)

## 2013-05-11 LAB — GLUCOSE, CAPILLARY

## 2013-05-11 LAB — CBC
Hemoglobin: 14.2 g/dL (ref 12.0–15.0)
MCH: 31.4 pg (ref 26.0–34.0)
MCV: 95.6 fL (ref 78.0–100.0)
RBC: 4.52 MIL/uL (ref 3.87–5.11)
WBC: 15.9 10*3/uL — ABNORMAL HIGH (ref 4.0–10.5)

## 2013-05-11 MED ORDER — POLYETHYLENE GLYCOL 3350 17 G PO PACK: 17.0000 g | PACK | Freq: Two times a day (BID) | ORAL | Status: DC

## 2013-05-11 MED ORDER — ACETAMINOPHEN 325 MG PO TABS: 650.0000 mg | ORAL_TABLET | Freq: Four times a day (QID) | ORAL | Status: AC | PRN

## 2013-05-11 MED ORDER — INFLUENZA VAC SPLIT QUAD 0.5 ML IM SUSP
0.5000 mL | INTRAMUSCULAR | Status: AC
  Administered 2013-05-11: 15:00:00 0.5 mL via INTRAMUSCULAR
  Filled 2013-05-11: qty 0.5

## 2013-05-11 MED ORDER — ASPIRIN 81 MG PO TBEC: 81.0000 mg | DELAYED_RELEASE_TABLET | Freq: Every day | ORAL | Status: DC

## 2013-05-11 MED ORDER — AMIODARONE HCL 200 MG PO TABS: 200.0000 mg | ORAL_TABLET | Freq: Two times a day (BID) | ORAL | Status: DC

## 2013-05-11 MED ORDER — TRANDOLAPRIL 4 MG PO TABS: 2.0000 mg | ORAL_TABLET | Freq: Every day | ORAL | Status: DC

## 2013-05-11 MED ORDER — PREDNISONE 10 MG PO TABS: 10.0000 mg | ORAL_TABLET | Freq: Every day | ORAL | Status: DC

## 2013-05-11 MED ORDER — GUAIFENESIN ER 600 MG PO TB12: 600.0000 mg | ORAL_TABLET | Freq: Two times a day (BID) | ORAL | Status: DC

## 2013-05-11 MED ORDER — PNEUMOCOCCAL VAC POLYVALENT 25 MCG/0.5ML IJ INJ
0.5000 mL | INJECTION | Freq: Once | INTRAMUSCULAR | Status: AC
  Administered 2013-05-11: 15:00:00 0.5 mL via INTRAMUSCULAR
  Filled 2013-05-11: qty 0.5

## 2013-05-11 MED ORDER — BOOST / RESOURCE BREEZE PO LIQD
1.0000 | ORAL | Status: DC
  Administered 2013-05-11: 1 via ORAL

## 2013-05-11 MED ORDER — SENNA 8.6 MG PO TABS: 2.0000 | ORAL_TABLET | Freq: Every day | ORAL | Status: DC

## 2013-05-11 MED ORDER — BOOST / RESOURCE BREEZE PO LIQD: 1.0000 | ORAL | Status: DC

## 2013-05-11 MED ORDER — ATORVASTATIN CALCIUM 80 MG PO TABS: 80.0000 mg | ORAL_TABLET | Freq: Every day | ORAL | Status: DC

## 2013-05-11 MED ORDER — APIXABAN 2.5 MG PO TABS: 2.5000 mg | ORAL_TABLET | Freq: Two times a day (BID) | ORAL | Status: DC

## 2013-05-11 NOTE — Progress Notes (Signed)
Called report to Tresa Endo, LPN at [facility] health care center.Will call center when pt leaves in ambulance later today.

## 2013-05-11 NOTE — Progress Notes (Signed)
Patient Name: Alexis Dillon Date of Encounter: 05/11/2013     Principal Problem:   Acute coronary syndrome Active Problems:   HIGH BLOOD PRESSURE   CHF (congestive heart failure)   COPD (chronic obstructive pulmonary disease)   Tobacco abuse   Hypertension   History of atrial septal defect   Mental retardation, idiopathic mild   Acute respiratory failure   Acute systolic CHF (congestive heart failure)   CKD (chronic kidney disease), stage III   AKI (acute kidney injury)   Elevated LFTs   Atrial fibrillation   Cardiomyopathy, ischemic   Hyperkalemia    SUBJECTIVE  Patient has no complaints today. No palpitations, lightheadedness, dizziness,  SOB, orthopnea, PND. No nausea.  CURRENT MEDS . amiodarone  200 mg Oral BID  . apixaban  2.5 mg Oral BID  . aspirin EC  81 mg Oral Daily  . atorvastatin  80 mg Oral q1800  . guaiFENesin  600 mg Oral BID  . pantoprazole  40 mg Oral Daily  . polyethylene glycol  17 g Oral BID  . predniSONE  20 mg Oral Q breakfast  . senna  2 tablet Oral Daily  . sodium chloride  3 mL Intravenous Q12H  . trandolapril  2 mg Oral Daily    OBJECTIVE  Filed Vitals:   05/10/13 1621 05/10/13 1949 05/10/13 2044 05/11/13 0548  BP: 97/46 125/71 102/63 96/60  Pulse: 62 72 90 67  Temp: 98.2 F (36.8 C) 96.5 F (35.8 C) 98 F (36.7 C) 97.7 F (36.5 C)  TempSrc: Oral Axillary Oral Oral  Resp: 20 16 19 18   Height:      Weight:   102 lb (46.267 kg) 100 lb 1.4 oz (45.4 kg)  SpO2: 96% 96% 96% 100%    Intake/Output Summary (Last 24 hours) at 05/11/13 0808 Last data filed at 05/11/13 0630  Gross per 24 hour  Intake    778 ml  Output   1400 ml  Net   -622 ml   Filed Weights   05/10/13 0417 05/10/13 2044 05/11/13 0548  Weight: 98 lb 8.7 oz (44.7 kg) 102 lb (46.267 kg) 100 lb 1.4 oz (45.4 kg)    PHYSICAL EXAM  General: No acute distress.  HEENT: Conjunctiva and lids normal, oropharynx clear.  Lungs: Coarse breath sounds, no wheezes or  rhonchi. Continues occasional coughing.  Cardiac: No elevated JVP or bruits. RRR without MRG. Abdomen: Normoactive bowel sounds, nontender, nondistended.  Extremities: No pitting edema, distal pulses full. Laceration to the left pre-tibial area. (She did this prior to admission).  Neuropsychiatric: Alert and oriented x3, affect appropriate.  Accessory Clinical Findings  CBC  Recent Labs  05/10/13 0547 05/11/13 0415  WBC 14.1* 15.9*  HGB 14.2 14.2  HCT 42.7 43.2  MCV 94.1 95.6  PLT 262 289   Basic Metabolic Panel  Recent Labs  05/10/13 0547 05/11/13 0415  NA 137 136  K 4.9 4.8  CL 96 94*  CO2 30 31  GLUCOSE 103* 105*  BUN 37* 35*  CREATININE 1.40* 1.54*  CALCIUM 8.4 8.3*    Cardiac Enzymes  Recent Labs  05/10/13 0547 05/10/13 1100 05/10/13 1716  TROPONINI <0.30 <0.30 <0.30    TELE  NSR, HR in 60s  ECG  Flutter on last, HR100. No new ECG  Radiology/Studies  Dg Chest Port 1 View  05/02/2013   CLINICAL DATA:  Cough and shortness of breath. Wheezing.  EXAM: PORTABLE CHEST - 1 VIEW  COMPARISON:  12/14/2006.  FINDINGS:  Chronic cardiomegaly. Status post median sternotomy. New, diffuse interstitial opacity. Haziness of the lower chest, likely small effusions. No pneumothorax. Chronic pulmonary hyperinflation.  IMPRESSION: 1. CHF with trace pleural effusions. 2. COPD.     Mm Digital Screening  05/02/2013   CLINICAL DATA:  Screening.  EXAM: DIGITAL SCREENING BILATERAL MAMMOGRAM WITH CAD  COMPARISON:  Previous exam(s).  ACR Breast Density Category b: There are scattered areas of fibroglandular density.  FINDINGS: There are no findings suspicious for malignancy. Images were processed with CAD.  IMPRESSION: No mammographic evidence of malignancy. A result letter of this screening mammogram will be mailed directly to the patient.  RECOMMENDATION: Screening mammogram in one year. (Code:[id number]-B-01Y)  BI-RADS CATEGORY  1: Negative    ASSESSMENT AND PLAN  Alexis Dillon is  a 67 y.o. female with a history of mild mental retardation, COPD, current tobacco abuse (50 pack years), hypertension, and ASD with closure late in life who was admitted to [facility] on 05/02/13 with acute respiratory failure secondary  systolic CHF and COPD. She underwent cath and was found to have multivessel CAD. She also underwent DCCV secondary for new onset atrial flutter.   1. Acute systolic heart failure - LVEF less than 30%, ACE resumed. BB held 2/2 COPD. Consider adding low dose beta blocker. Euvolemic on exam. No SOB -- Down 2.6L, weight measurements unreliable.  2. Atrial flutter- new onset - now in sinus after TEE/CV. On amiodarone 200 mg BID x 2 weeks and then 200 mg daily thereafter. Today will be her 3rd day of amio BID. On Eliquis 2.5 BID  3. Leukocytosis- Today 15.9, up a little from yesterday (14.1). Has had an elevated white count since admission. Peak 19K. She is on prednisone for her COPD. Will repeat CXR and UA.   4. NSTEMI - Came in with mildly elevated troponin. Now normal. 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 Med Rx planned. BB was held 2/2 COPD exacerbation. Cont ASA, statin.   5. Acute kidney injury -Cr 1.54 today/. Slight bump from yesterday (1.40). Mavik given yesterday and day before. Lasix still held.   Would anticipate that she could be discharge [facility] later this week.  Urban Gibson PA-C  Pager [phone]  Patient seen, examined. Available data reviewed. Agree with findings, assessment, and plan as outlined by Deborha Payment, PA-C. Exam reveals alert, oriented woman in NAD. Lungs clear without rales, CV RRR without murmur or gallop, abdomen soft, NT, extremities without edema. Tele reviewed and shows sinus rhythm. Meds reviewed - would continue amiodarone/mavik. I would not add a beta-blocker at this point considering acute COPD exacerbation and marginal BP (some readings in 90s/40s). Can be  added as outpatient. Decrease amiodarone to 200 mg daily in 2 weeks. Will arrange follow-up in [place] office. Pt stable from CV standpoint for transfer to [facility].  Tonny Bollman, M.D. 05/11/2013 8:40 AM

## 2013-05-11 NOTE — Progress Notes (Signed)
INITIAL NUTRITION ASSESSMENT  DOCUMENTATION CODES Per approved criteria  -Underweight   INTERVENTION: Add Resource Breeze po daily, each supplement provides 250 kcal and 9 grams of protein. RD to continue to follow nutrition care plan.  NUTRITION DIAGNOSIS: Increased nutrient needs related to COPD and need for weight gain as evidenced by estimated needs.   Goal: Intake to meet >90% of estimated nutrition needs.  Monitor:  weight trends, lab trends, I/O's, PO intake, supplement tolerance  Reason for Assessment: Malnutrition Screening Tool  67 y.o. female  Admitting Dx: Acute coronary syndrome  ASSESSMENT: PMHx significant for mild mental retardation, COPD, tobacco use, HTN and ASD. Admitted with SOB x 3 days. Work-up reveals hypoxia 2/2 COPD and CHF.  Underwent heart cath on 12/19. Underwent TEE and cardioversion 12/23. Currently ordered for Heart Healthy diet eating fairly well. She reports that her oral intake this morning was poor 2/2 food choices, but otherwise, she has been eating very well.  Plan to d/c to [facility].  Pt reports that her usual weight is 98 - 100 lb. Currently is 100 lb.   Nutrition Focused Physical Exam:  Subcutaneous Fat:  Orbital Region: WNL Upper Arm Region: n/a Thoracic and Lumbar Region: n/a  Muscle:  Temple Region: WNL Clavicle Bone Region: moderate wasting Clavicle and Acromion Bone Region: moderate wasting Scapular Bone Region: n/a Dorsal Hand: n/a Patellar Region: severe wasting Anterior Thigh Region: severe wasting Posterior Calf Region: severe wasting  Edema: n/a  Pt is at nutrition risk 2/2 severe muscle mass loss in lower extremities.   Height: Ht Readings from Last 1 Encounters:  05/02/13 5\' 5"  (1.651 m)    Weight: Wt Readings from Last 1 Encounters:  05/11/13 100 lb 1.4 oz (45.4 kg)    Ideal Body Weight: 125 lb  % Ideal Body Weight: 80%  Wt Readings from Last 10 Encounters:  05/11/13 100 lb 1.4 oz (45.4 kg)   05/11/13 100 lb 1.4 oz (45.4 kg)  05/11/13 100 lb 1.4 oz (45.4 kg)    Usual Body Weight: 98 - 100 lb  % Usual Body Weight: 100%  BMI:  Body mass index is 16.66 kg/(m^2). Underweight  Estimated Nutritional Needs: Kcal: 1350 - 1500 kcal Protein: 50 - 60 g Fluid: 1.5 liters daily  Skin: abrasion to L leg  Diet Order: Cardiac  EDUCATION NEEDS: -No education needs identified at this time   Intake/Output Summary (Last 24 hours) at 05/11/13 0951 Last data filed at 05/11/13 0941  Gross per 24 hour  Intake    598 ml  Output   1525 ml  Net   -927 ml    Last BM: 12/25  Labs:   Recent Labs Lab 05/09/13 0340 05/10/13 0547 05/11/13 0415  NA 136 137 136  K 5.3* 4.9 4.8  CL 96 96 94*  CO2 29 30 31   BUN 44* 37* 35*  CREATININE 1.52* 1.40* 1.54*  CALCIUM 8.5 8.4 8.3*  GLUCOSE 109* 103* 105*    CBG (last 3)  No results found for this basename: GLUCAP,  in the last 72 hours  Scheduled Meds: . amiodarone  200 mg Oral BID  . apixaban  2.5 mg Oral BID  . aspirin EC  81 mg Oral Daily  . atorvastatin  80 mg Oral q1800  . guaiFENesin  600 mg Oral BID  . pantoprazole  40 mg Oral Daily  . polyethylene glycol  17 g Oral BID  . predniSONE  20 mg Oral Q breakfast  . senna  2 tablet Oral Daily  . sodium chloride  3 mL Intravenous Q12H  . trandolapril  2 mg Oral Daily    Continuous Infusions:   Past Medical History  Diagnosis Date  . COPD (chronic obstructive pulmonary disease)   . Tobacco abuse   . Hypertension   . History of atrial septal defect     Closure in 50's   . Mental retardation, idiopathic mild   . Arthritis     Past Surgical History  Procedure Laterality Date  . Asd repair      in her 96s  . Total hip arthroplasty Right     in her 50s  . Total abdominal hysterectomy      in the 60s  . Tee without cardioversion N/A 05/08/2013    Procedure: TRANSESOPHAGEAL ECHOCARDIOGRAM (TEE);  Surgeon: Lewayne Bunting, MD;  Location: [facility] ENDOSCOPY;  Service:  Cardiovascular;  Laterality: N/A;  . Cardioversion N/A 05/08/2013    Procedure: CARDIOVERSION;  Surgeon: Lewayne Bunting, MD;  Location: [facility] ENDOSCOPY;  Service: Cardiovascular;  Laterality: N/A;  Dr. Adriana Reams verified with EP pt in a flutter...will cardiovert...  150 joules @ 4:40, using Propofol 40  mg/IV .Marland Kitchen.successful NSR     Jarold Motto MS, RD, LDN Pager: [phone] After-hours pager: [phone]

## 2013-05-11 NOTE — Progress Notes (Signed)
UA and CXR negative DR Hongalgi made aware

## 2013-05-11 NOTE — Discharge Summary (Signed)
Physician Discharge Summary  Surina Storts ZOX:[id number] DOB: 09-05-1945 DOA: 05/02/2013  PCP: Fredirick Maudlin, MD  Admit date: 05/02/2013 Discharge date: 05/11/2013  Time spent: Greater than 30 minutes  Recommendations for Outpatient Follow-up:  1. Bailey Mech, Cardiology on 05/23/13 at 1:30 PM 2. Dr. Kari Baars or M.D. at SNF, in 5 days with repeat labs (CBC & BMP)  Discharge Diagnoses:  Principal Problem:   Acute coronary syndrome Active Problems:   HIGH BLOOD PRESSURE   CHF (congestive heart failure)   COPD (chronic obstructive pulmonary disease)   Tobacco abuse   Hypertension   History of atrial septal defect   Mental retardation, idiopathic mild   Acute respiratory failure   Acute systolic CHF (congestive heart failure)   CKD (chronic kidney disease), stage III   AKI (acute kidney injury)   Elevated LFTs   Atrial fibrillation   Cardiomyopathy, ischemic   Hyperkalemia   Discharge Condition: Improved & Stable  Diet recommendation: Heart healthy diet  Filed Weights   05/10/13 0417 05/10/13 2044 05/11/13 0548  Weight: 44.7 kg (98 lb 8.7 oz) 46.267 kg (102 lb) 45.4 kg (100 lb 1.4 oz)    History of present illness:  Alexis Dillon is an 67 y.o. female with a PMH of mild mental retardation, COPD, current tobacco abuse, hypertension, atrial septal defect with closure late in life, was transferred from [facility] for evaluation of ongoing shortness of breath after she was discovered to have elevated troponins and an abnormal EKG with ST changes  Hospital Course:   Principal Problem:  Acute coronary syndrome/NSTEMI  The patient was admitted and evaluated by cardiology. 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 Cardiology recommends medical management per note due to co-morbidities and family request  after lengthy discussion with Dr. Antoine Poche. Continue aspirin and statins. Troponin reached a maximum value of 1.43, and was subsequently normal 05/03/2013. ACE inhibitor/Mavik temporarily held secondary to AKI but has been resumed at half the home dose. Cardiology has cleared her for discharge and have arranged outpatient followup.  Active Problems:  Atrial fibrillation/flutter-new onset  Patient was initially placed on heparin and amiodarone. Cardiology consulted and successfully performed TEE guided [facility] on 05/08/13. Patient is now placed on Apixaban. She is maintaining sinus rhythm. She was initially placed on amiodarone drip which was switched on 12/24 to by mouth amiodarone. She is to be on 200 mg by mouth twice a day for 2 weeks total and then reduce to 200 mg daily. CHADS score of 3 for HF , Hypertension and Age.   Elevated LFTs  Likely from passive congestion of the liver, improving.   Stage III CKD, AKI  GFR 37-44. Patient admitted with creatinine of 1.27 which peaked to 1.79 on 12/23. Lasix and ACE inhibitors temporarily held. Creatinine has improved to 1.5 and stable. Follow BMP closely as outpatient.  Mild hyperkalemia 12/24  - Not on potassium supplements.? Secondary to acute kidney injury. Resolved after a dose of Kayexalate.   HIGH BLOOD PRESSURE / hypertension  Blood pressure controlled.   Acute systolic CHF (congestive heart failure) / ischemic cardiomyopathy  Status post 2-D echo on 05/02/2013. EF 20-25% with evidence of severe left ventricular and right ventricular failure. Continue medical therapy- ACE inhibitors. Euvolemic-continue to hold Lasix and can be added as needed at outpatient followup. No beta blockers added at this point secondary to recent  COPD exacerbation and marginal blood pressures. This also can be considered as outpatient.  COPD (chronic obstructive pulmonary disease)  Continue duo nebs, prednisone, supplemental oxygen as needed. Solu-Medrol discontinued  05/05/2013. Status post 5 days of Rocephin. Now on Prednisone taper-will complete in 3 days.  Tobacco abuse  Counseled on cessation. Poor insight.   History of atrial septal defect  Cardiology following. Status post closure in 50's   Mental retardation, idiopathic mild  Nursing staff providing supportive care.   Acute respiratory failure  Multifactorial with COPD exacerbation, acute on chronic systolic contributory. See above.  - Improved/resolved.  Leukocytosis - Possibly due to steroids. OP follow up.  Consultations:  Cardiology  Procedures:  Cardioversion    Discharge Exam:  Complaints:  Denies complaints. No dyspnea, chest pain, nausea or vomiting. No acute events per nursing.  Filed Vitals:   05/10/13 1949 05/10/13 2044 05/11/13 0548 05/11/13 0910  BP: 125/71 102/63 96/60 137/67  Pulse: 72 90 67 68  Temp: 96.5 F (35.8 C) 98 F (36.7 C) 97.7 F (36.5 C) 97.8 F (36.6 C)  TempSrc: Axillary Oral Oral Oral  Resp: 16 19 18 18   Height:      Weight:  46.267 kg (102 lb) 45.4 kg (100 lb 1.4 oz)   SpO2: 96% 96% 100% 99%    Gen: NAD  Cardiovascular: S1 and S2 heard, RRR. No JVD, murmurs or pedal edema. Telemetry shows sinus bradycardia in the 50s.  Respiratory: Clear to auscultation. No increased work of breathing.  Gastrointestinal: Abdomen soft, NT/ND, + BS  Extremities: No C/E/C  CNS: Alert and oriented. No focal neurological deficits   Discharge Instructions      Discharge Orders   Future Appointments Provider Department Dept Phone   05/23/2013 1:30 PM Jodelle Gross, NP [facility] [place] [phone]   Future Orders Complete By Expires   (HEART FAILURE PATIENTS) Call MD:  Anytime you have any of the following symptoms: 1) 3 pound weight gain in 24 hours or 5 pounds in 1 week 2) shortness of breath, with or without a dry hacking cough 3) swelling in the hands, feet or stomach 4) if you have to sleep on extra pillows at night in order to  breathe.  As directed    Call MD for:  difficulty breathing, headache or visual disturbances  As directed    Call MD for:  extreme fatigue  As directed    Call MD for:  persistant dizziness or light-headedness  As directed    Call MD for:  severe uncontrolled pain  As directed    Call MD for:  temperature >100.4  As directed    Diet - low sodium heart healthy  As directed    Increase activity slowly  As directed        Medication List         acetaminophen 325 MG tablet  Commonly known as:  TYLENOL  Take 2 tablets (650 mg total) by mouth every 6 (six) hours as needed for mild pain or fever.     albuterol (2.5 MG/3ML) 0.083% nebulizer solution  Commonly known as:  PROVENTIL  Take 2.5 mg by nebulization every 6 (six) hours as needed for wheezing or shortness of breath.     amiodarone 200 MG tablet  Commonly known as:  PACERONE  Take 1 tablet (200 mg total) by mouth 2 (two) times daily. Reduce to 200 mg daily from 05/23/2013     apixaban 2.5 MG Tabs tablet  Commonly known  asEverlene Balls  Take 1 tablet (2.5 mg total) by mouth 2 (two) times daily.     aspirin 81 MG EC tablet  Take 1 tablet (81 mg total) by mouth daily.     atorvastatin 80 MG tablet  Commonly known as:  LIPITOR  Take 1 tablet (80 mg total) by mouth daily at 6 PM.     feeding supplement (RESOURCE BREEZE) Liqd  Take 1 Container by mouth daily.     guaiFENesin 600 MG 12 hr tablet  Commonly known as:  MUCINEX  Take 1 tablet (600 mg total) by mouth 2 (two) times daily.     ipratropium 17 MCG/ACT inhaler  Commonly known as:  ATROVENT HFA  Inhale 1 puff into the lungs every 6 (six) hours as needed for wheezing.     pantoprazole 40 MG tablet  Commonly known as:  PROTONIX  Take 40 mg by mouth daily.     polyethylene glycol packet  Commonly known as:  MIRALAX / GLYCOLAX  Take 17 g by mouth 2 (two) times daily.     predniSONE 10 MG tablet  Commonly known as:  DELTASONE  Take 1 tablet (10 mg total) by mouth daily  with breakfast.     senna 8.6 MG Tabs tablet  Commonly known as:  SENOKOT  Take 2 tablets (17.2 mg total) by mouth daily.     trandolapril 4 MG tablet  Commonly known as:  MAVIK  Take 0.5 tablets (2 mg total) by mouth daily.       Follow-up Information   Follow up with Joni Reining, NP On 05/23/2013. (at 1:30 pm)    Specialty:  Nurse Practitioner   Contact information:   [address] [place] [address] [phone]       Follow up with HAWKINS,EDWARD L, MD. Schedule an appointment as soon as possible for a visit in 5 days. (To be seen with repeat labs (CBC & BMP))    Specialty:  Pulmonary Disease   Contact information:   [address] [place] [address] [phone]        The results of significant diagnostics from this hospitalization (including imaging, microbiology, ancillary and laboratory) are listed below for reference.    Significant Diagnostic Studies: Dg Chest 2 View  05/11/2013   CLINICAL DATA:  Shortness of breath, weakness  EXAM: CHEST  2 VIEW  COMPARISON:  05/02/2013; 12/14/2006; 03/21/2006; chest CT - 03/25/2006  FINDINGS: Grossly unchanged cardiac silhouette and mediastinal contours post median sternotomy. The lungs appear hyperexpanded with flattening of the bilateral hemidiaphragms. Overall improved aeration of the lungs. No focal airspace opacities. There is persistent mild eventration of the right hemidiaphragm. No definite pleural effusion or pneumothorax. Unchanged bones.  IMPRESSION: 1. Hyperexpanded lungs without acute cardiopulmonary disease. 2. Interval resolution of previously suspected pulmonary edema.   Electronically Signed   By: Simonne Come M.D.   On: 05/11/2013 08:54   Dg Chest Port 1 View  05/02/2013   CLINICAL DATA:  Cough and shortness of breath. Wheezing.  EXAM: PORTABLE CHEST - 1 VIEW  COMPARISON:  12/14/2006.  FINDINGS: Chronic cardiomegaly. Status post median sternotomy. New, diffuse interstitial opacity.  Haziness of the lower chest, likely small effusions. No pneumothorax. Chronic pulmonary hyperinflation.  IMPRESSION: 1. CHF with trace pleural effusions. 2. COPD.   Electronically Signed   By: Tiburcio Pea M.D.   On: 05/02/2013 03:01   Mm Digital Screening  05/02/2013   CLINICAL DATA:  Screening.  EXAM: DIGITAL SCREENING BILATERAL MAMMOGRAM WITH CAD  COMPARISON:  Previous exam(s).  ACR Breast Density Category b: There are scattered areas of fibroglandular density.  FINDINGS: There are no findings suspicious for malignancy. Images were processed with CAD.  IMPRESSION: No mammographic evidence of malignancy. A result letter of this screening mammogram will be mailed directly to the patient.  RECOMMENDATION: Screening mammogram in one year. (Code:SM-B-01Y)  BI-RADS CATEGORY  1: Negative   Electronically Signed   By: Christiana Pellant M.D.   On: 05/02/2013 13:15    Microbiology: Recent Results (from the past 240 hour(s))  CULTURE, BLOOD (ROUTINE X 2)     Status: None   Collection Time    05/02/13  3:02 AM      Result Value Range Status   Specimen Description BLOOD RIGHT ANTECUBITAL   Final   Special Requests     Final   Value: BOTTLES DRAWN AEROBIC AND ANAEROBIC AEB=4CC ANA=2CC   Culture NO GROWTH 5 DAYS   Final   Report Status 05/07/2013 FINAL   Final  CULTURE, BLOOD (ROUTINE X 2)     Status: None   Collection Time    05/02/13  3:02 AM      Result Value Range Status   Specimen Description BLOOD RIGHT ANTECUBITAL   Final   Special Requests BOTTLES DRAWN AEROBIC ONLY 4CC   Final   Culture NO GROWTH 5 DAYS   Final   Report Status 05/07/2013 FINAL   Final  URINE CULTURE     Status: None   Collection Time    05/02/13  4:44 AM      Result Value Range Status   Specimen Description URINE, CLEAN CATCH   Final   Special Requests NONE   Final   Culture  Setup Time     Final   Value: 05/02/2013 10:41     Performed at [facility] Count     Final   Value: NO GROWTH      Performed at [facility]   Culture     Final   Value: NO GROWTH     Performed at [facility]   Report Status 05/03/2013 FINAL   Final  MRSA PCR SCREENING     Status: None   Collection Time    05/04/13 10:58 AM      Result Value Range Status   MRSA by PCR NEGATIVE  NEGATIVE Final   Comment:            The GeneXpert MRSA Assay (FDA     approved for NASAL specimens     only), is one component of a     comprehensive MRSA colonization     surveillance program. It is not     intended to diagnose MRSA     infection nor to guide or     monitor treatment for     MRSA infections.     Labs: Basic Metabolic Panel:  Recent Labs Lab 05/07/13 0603 05/08/13 0452 05/09/13 0340 05/10/13 0547 05/11/13 0415  NA 131* 133* 136 137 136  K 4.9 4.6 5.3* 4.9 4.8  CL 91* 90* 96 96 94*  CO2 28 29 29 30 31   GLUCOSE 122* 113* 109* 103* 105*  BUN 43* 51* 44* 37* 35*  CREATININE 1.46* 1.79* 1.52* 1.40* 1.54*  CALCIUM 8.7 8.2* 8.5 8.4 8.3*   Liver Function Tests:  Recent Labs Lab 05/06/13 1100  AST 35  ALT 48*  ALKPHOS 102  BILITOT 0.2*  PROT 6.4  ALBUMIN 3.5   No results found for this basename: LIPASE, AMYLASE,  in the last 168 hours No results found for this basename: AMMONIA,  in the last 168 hours CBC:  Recent Labs Lab 05/07/13 0603 05/08/13 0452 05/09/13 0340 05/10/13 0547 05/11/13 0415  WBC 14.7* 15.2* 14.4* 14.1* 15.9*  HGB 15.1* 14.8 14.6 14.2 14.2  HCT 45.6 44.0 43.9 42.7 43.2  MCV 94.6 93.6 94.4 94.1 95.6  PLT 267 278 266 262 289   Cardiac Enzymes:  Recent Labs Lab 05/09/13 1730 05/09/13 2219 05/10/13 0547 05/10/13 1100 05/10/13 1716  TROPONINI <0.30 <0.30 <0.30 <0.30 <0.30   BNP: BNP (last 3 results)  Recent Labs  05/02/13 0236  PROBNP 11638.0*   CBG:  Recent Labs Lab 05/11/13 1045  GLUCAP 109*    Additional labs: 1. TSH: 1.577 2. 2-D echo 05/02/13: Study Conclusions  - Left ventricle: The cavity size was moderately  dilated. Wall thickness was normal. Systolic function was severely reduced. The estimated ejection fraction was in the range of 20% to 25%. - Mitral valve: Mild regurgitation. - Right ventricle: The cavity size was moderately dilated. Systolic function was moderately reduced. Impressions:  - Patient has severe LV and RV failure  Discussed with patient's sister Ms. Lysbeth Penner, updated care and answered questions.  Signed:  Marcellus Scott, MD, FACP, FHM. [facility] Pager [phone]  If 7PM-7AM, please contact night-coverage [email or web address] Password [email or web address] 05/11/2013, 1:18 PM

## 2013-05-11 NOTE — Progress Notes (Signed)
Pt had refused flu & pneumonia vaccines several times prior to asking this admission. Called out and asked for both now prior to being discharged. Says she talked with her family and says now she wants them orders placed.

## 2013-05-11 NOTE — Progress Notes (Signed)
Pt discharged per ambulance accompanied by 2 personnel. They have transfer packet with all info. Family aware of d/c. Pt has pneumonia and flu vaccines prior to d/c. Pt has all personnel belongings including her Angie Fava.

## 2013-05-13 NOTE — Clinical Social Work Placement (Addendum)
    Clinical Social Work Department CLINICAL SOCIAL WORK PLACEMENT NOTE 05/13/2013  Patient:  Alexis Dillon, Alexis Dillon  Account Number:  [id number] Admit date:  05/02/2013  Clinical Social Worker:  Maryclare Labrador, Theresia Majors  Date/time:  05/07/2013 03:48 PM  Clinical Social Work is seeking post-discharge placement for this patient at the following level of care:   SKILLED NURSING   (*CSW will update this form in Epic as items are completed)   05/07/2013  Patient/family provided with [facility] Department of Clinical Social Work's list of facilities offering this level of care within the geographic area requested by the patient (or if unable, by the patient's family).  05/07/2013  Patient/family informed of their freedom to choose among providers that offer the needed level of care, that participate in Medicare, Medicaid or managed care program needed by the patient, have an available bed and are willing to accept the patient.  05/07/2013  Patient/family informed of [facility]' ownership interest in [facility], as well as of the fact that they are under no obligation to receive care at this facility.  PASARR submitted to EDS on 05/07/2013 PASARR number received from EDS on 05/07/2013  FL2 transmitted to all facilities in geographic area requested by pt/family on  05/07/2013 FL2 transmitted to all facilities within larger geographic area on   Patient informed that his/her managed care company has contracts with or will negotiate with  certain facilities, including the following:   [facility]     Patient/family informed of bed offers received:  05/11/2013 Patient chooses bed at [facility] [place] Physician recommends and patient chooses bed at    Patient to be transferred to [facility] [place] on  05/11/2013 Patient to be transferred to facility by Ambulance Sharin Mons)  The following physician request were entered in Epic:   Additional Comments: 05/11/13  Patient and family wanted  placement at the [facility] but they are currently under quarantine for the flu.  Their second choice for placement was [facility].  OK for d/c per MD.  Patient and sister agreed to SNF plan; nursing notified and called report.  No further CSW needs identified. CSW signing off.  Lorri Frederick. [place]  [phone]

## 2013-05-23 ENCOUNTER — Ambulatory Visit (INDEPENDENT_AMBULATORY_CARE_PROVIDER_SITE_OTHER): Payer: PRIVATE HEALTH INSURANCE | Admitting: Adult Health

## 2013-05-23 ENCOUNTER — Encounter: Payer: Self-pay | Admitting: Adult Health

## 2013-05-23 VITALS — BP 101/53 | HR 68 | Ht 63.0 in | Wt 98.8 lb

## 2013-05-23 DIAGNOSIS — Z8679 Personal history of other diseases of the circulatory system: Secondary | ICD-10-CM | POA: Diagnosis not present

## 2013-05-23 DIAGNOSIS — I509 Heart failure, unspecified: Secondary | ICD-10-CM | POA: Diagnosis not present

## 2013-05-23 DIAGNOSIS — I4891 Unspecified atrial fibrillation: Secondary | ICD-10-CM

## 2013-05-23 LAB — CBC
HCT: 37.5 % (ref 36.0–46.0)
Hemoglobin: 12.7 g/dL (ref 12.0–15.0)
MCH: 30.8 pg (ref 26.0–34.0)
MCHC: 33.9 g/dL (ref 30.0–36.0)
MCV: 91 fL (ref 78.0–100.0)
Platelets: 239 10*3/uL (ref 150–400)
RBC: 4.12 MIL/uL (ref 3.87–5.11)
RDW: 14.9 % (ref 11.5–15.5)
WBC: 12.4 10*3/uL — ABNORMAL HIGH (ref 4.0–10.5)

## 2013-05-23 MED ORDER — AMIODARONE HCL 200 MG PO TABS: ORAL_TABLET | ORAL | Status: DC

## 2013-05-23 NOTE — Assessment & Plan Note (Signed)
Doing well. No changes in medication regimen at this time.

## 2013-05-23 NOTE — Assessment & Plan Note (Signed)
Remains in NSR, with amiodarone. She will continue on BID dosing for one more week, and then change to daily dosing of 200 mg on 05/31/2012. I will check a CBC and hemoccult stools as she is on Eliquis for CVA prophylaxis. See her again in 3 months.

## 2013-05-23 NOTE — Progress Notes (Deleted)
Name: Alexis Dillon    DOB: 10-02-1945  Age: 68 y.o.  MR#: [id number]       PCP:  Alonza Bogus, MD      Insurance: Payor: MEDICARE / Plan: MEDICARE PART A AND B / Product Type: *No Product type* /   CC:    Chief Complaint  Patient presents with  . Coronary Artery Disease    S/P cath 04/2013 Medical managment  . Atrial Fibrillation    VS Filed Vitals:   05/23/13 1334  BP: 101/53  Pulse: 68  Height: 5\' 3"  (1.6 m)  Weight: 98 lb 12 oz (44.793 kg)    Weights Current Weight  05/23/13 98 lb 12 oz (44.793 kg)  05/11/13 100 lb 1.4 oz (45.4 kg)  05/11/13 100 lb 1.4 oz (45.4 kg)    Blood Pressure  BP Readings from Last 3 Encounters:  05/23/13 101/53  05/11/13 126/56  05/11/13 126/56     Admit date:  (Not on file) Last encounter with RMR:  Visit date not found   Allergy Review of patient's allergies indicates no known allergies.  Current Outpatient Prescriptions  Medication Sig Dispense Refill  . acetaminophen (TYLENOL) 325 MG tablet Take 2 tablets (650 mg total) by mouth every 6 (six) hours as needed for mild pain or fever.      Marland Kitchen albuterol (PROVENTIL) (2.5 MG/3ML) 0.083% nebulizer solution Take 2.5 mg by nebulization every 6 (six) hours as needed for wheezing or shortness of breath.      . ALPRAZolam (XANAX) 0.25 MG tablet       . amiodarone (PACERONE) 200 MG tablet Take 200 mg by mouth daily. Reduce to 200 mg daily from 05/23/2013      . apixaban (ELIQUIS) 2.5 MG TABS tablet Take 1 tablet (2.5 mg total) by mouth 2 (two) times daily.  60 tablet  0  . aspirin EC 81 MG EC tablet Take 1 tablet (81 mg total) by mouth daily.      Marland Kitchen atorvastatin (LIPITOR) 80 MG tablet Take 1 tablet (80 mg total) by mouth daily at 6 PM.  30 tablet  0  . feeding supplement, RESOURCE BREEZE, (RESOURCE BREEZE) LIQD Take 1 Container by mouth daily.      Marland Kitchen guaiFENesin (MUCINEX) 600 MG 12 hr tablet Take 1 tablet (600 mg total) by mouth 2 (two) times daily.  15 tablet  0  . HYDROcodone-acetaminophen  (NORCO/VICODIN) 5-325 MG per tablet       . ipratropium (ATROVENT HFA) 17 MCG/ACT inhaler Inhale 1 puff into the lungs every 6 (six) hours as needed for wheezing.      . pantoprazole (PROTONIX) 40 MG tablet Take 40 mg by mouth daily.      . polyethylene glycol (MIRALAX / GLYCOLAX) packet Take 17 g by mouth 2 (two) times daily.      . predniSONE (DELTASONE) 10 MG tablet Take 1 tablet (10 mg total) by mouth daily with breakfast.  3 tablet  0  . senna (SENOKOT) 8.6 MG TABS tablet Take 2 tablets (17.2 mg total) by mouth daily.      . trandolapril (MAVIK) 4 MG tablet Take 0.5 tablets (2 mg total) by mouth daily.  30 tablet  0   No current facility-administered medications for this visit.    Discontinued Meds:    Medications Discontinued During This Encounter  Medication Reason  . amiodarone (PACERONE) 200 MG tablet     Patient Active Problem List   Diagnosis Date Noted  . Hyperkalemia 05/09/2013  .  Cardiomyopathy, ischemic 05/07/2013  . Elevated LFTs 05/06/2013  . Atrial fibrillation 05/06/2013  . CKD (chronic kidney disease), stage III 05/05/2013  . AKI (acute kidney injury) 05/05/2013  . Acute systolic CHF (congestive heart failure) 05/04/2013  . Acute coronary syndrome 05/03/2013  . CHF (congestive heart failure) 05/02/2013  . Acute respiratory failure 05/02/2013  . COPD (chronic obstructive pulmonary disease)   . Tobacco abuse   . Hypertension   . History of atrial septal defect   . Mental retardation, idiopathic mild   . DEGENERATIVE JOINT DISEASE, RIGHT KNEE 07/28/2009  . LOW BACK PAIN 11/15/2007  . DEGENERATIVE JOINT DISEASE, RIGHT HIP 03/06/2007  . TOTAL HIP FOLLOW-UP 01/24/2007  . HIGH BLOOD PRESSURE 12/16/2006    LABS    Component Value Date/Time   NA 136 05/11/2013 0415   NA 137 05/10/2013 0547   NA 136 05/09/2013 0340   K 4.8 05/11/2013 0415   K 4.9 05/10/2013 0547   K 5.3* 05/09/2013 0340   CL 94* 05/11/2013 0415   CL 96 05/10/2013 0547   CL 96 05/09/2013  0340   CO2 31 05/11/2013 0415   CO2 30 05/10/2013 0547   CO2 29 05/09/2013 0340   GLUCOSE 105* 05/11/2013 0415   GLUCOSE 103* 05/10/2013 0547   GLUCOSE 109* 05/09/2013 0340   BUN 35* 05/11/2013 0415   BUN 37* 05/10/2013 0547   BUN 44* 05/09/2013 0340   CREATININE 1.54* 05/11/2013 0415   CREATININE 1.40* 05/10/2013 0547   CREATININE 1.52* 05/09/2013 0340   CALCIUM 8.3* 05/11/2013 0415   CALCIUM 8.4 05/10/2013 0547   CALCIUM 8.5 05/09/2013 0340   GFRNONAA 34* 05/11/2013 0415   GFRNONAA 38* 05/10/2013 0547   GFRNONAA 34* 05/09/2013 0340   GFRAA 39* 05/11/2013 0415   GFRAA 44* 05/10/2013 0547   GFRAA 40* 05/09/2013 0340   CMP     Component Value Date/Time   NA 136 05/11/2013 0415   K 4.8 05/11/2013 0415   CL 94* 05/11/2013 0415   CO2 31 05/11/2013 0415   GLUCOSE 105* 05/11/2013 0415   BUN 35* 05/11/2013 0415   CREATININE 1.54* 05/11/2013 0415   CALCIUM 8.3* 05/11/2013 0415   PROT 6.4 05/06/2013 1100   ALBUMIN 3.5 05/06/2013 1100   AST 35 05/06/2013 1100   ALT 48* 05/06/2013 1100   ALKPHOS 102 05/06/2013 1100   BILITOT 0.2* 05/06/2013 1100   GFRNONAA 34* 05/11/2013 0415   GFRAA 39* 05/11/2013 0415       Component Value Date/Time   WBC 15.9* 05/11/2013 0415   WBC 14.1* 05/10/2013 0547   WBC 14.4* 05/09/2013 0340   HGB 14.2 05/11/2013 0415   HGB 14.2 05/10/2013 0547   HGB 14.6 05/09/2013 0340   HCT 43.2 05/11/2013 0415   HCT 42.7 05/10/2013 0547   HCT 43.9 05/09/2013 0340   MCV 95.6 05/11/2013 0415   MCV 94.1 05/10/2013 0547   MCV 94.4 05/09/2013 0340    Lipid Panel  No results found for this basename: chol, trig, hdl, cholhdl, vldl, ldlcalc    ABG    Component Value Date/Time   PHART 7.452* 05/04/2013 1002   PCO2ART 40.1 05/04/2013 1002   PO2ART 84.0 05/04/2013 1002   HCO3 28.0* 05/04/2013 1002   TCO2 29 05/04/2013 1002   ACIDBASEDEF 0.4 05/02/2013 0306   O2SAT 97.0 05/04/2013 1002     Lab Results  Component Value Date   TSH 1.577 05/02/2013    BNP (last 3 results)  Recent Labs  05/02/13 0236  PROBNP 11638.0*   Cardiac Panel (last 3 results) No results found for this basename: CKTOTAL, CKMB, TROPONINI, RELINDX,  in the last 72 hours  Iron/TIBC/Ferritin No results found for this basename: iron, tibc, ferritin     EKG Orders placed in visit on 05/23/13  . EKG 12-LEAD     Prior Assessment and Plan Problem List as of 05/23/2013   COPD (chronic obstructive pulmonary disease)   DEGENERATIVE JOINT DISEASE, RIGHT HIP   DEGENERATIVE JOINT DISEASE, RIGHT KNEE   LOW BACK PAIN   HIGH BLOOD PRESSURE   TOTAL HIP FOLLOW-UP   CHF (congestive heart failure)   Tobacco abuse   Hypertension   History of atrial septal defect   Mental retardation, idiopathic mild   Acute respiratory failure   Acute coronary syndrome   Acute systolic CHF (congestive heart failure)   CKD (chronic kidney disease), stage III   AKI (acute kidney injury)   Elevated LFTs   Atrial fibrillation   Cardiomyopathy, ischemic   Hyperkalemia       Imaging: Dg Chest 2 View  05/11/2013   CLINICAL DATA:  Shortness of breath, weakness  EXAM: CHEST  2 VIEW  COMPARISON:  05/02/2013; 12/14/2006; 03/21/2006; chest CT - 03/25/2006  FINDINGS: Grossly unchanged cardiac silhouette and mediastinal contours post median sternotomy. The lungs appear hyperexpanded with flattening of the bilateral hemidiaphragms. Overall improved aeration of the lungs. No focal airspace opacities. There is persistent mild eventration of the right hemidiaphragm. No definite pleural effusion or pneumothorax. Unchanged bones.  IMPRESSION: 1. Hyperexpanded lungs without acute cardiopulmonary disease. 2. Interval resolution of previously suspected pulmonary edema.   Electronically Signed   By: Sandi Mariscal M.D.   On: 05/11/2013 08:54   Dg Chest Port 1 View  05/02/2013   CLINICAL DATA:  Cough and shortness of breath. Wheezing.  EXAM: PORTABLE CHEST - 1 VIEW  COMPARISON:  12/14/2006.  FINDINGS:  Chronic cardiomegaly. Status post median sternotomy. New, diffuse interstitial opacity. Haziness of the lower chest, likely small effusions. No pneumothorax. Chronic pulmonary hyperinflation.  IMPRESSION: 1. CHF with trace pleural effusions. 2. COPD.   Electronically Signed   By: Jorje Guild M.D.   On: 05/02/2013 03:01   Mm Digital Screening  05/02/2013   CLINICAL DATA:  Screening.  EXAM: DIGITAL SCREENING BILATERAL MAMMOGRAM WITH CAD  COMPARISON:  Previous exam(s).  ACR Breast Density Category b: There are scattered areas of fibroglandular density.  FINDINGS: There are no findings suspicious for malignancy. Images were processed with CAD.  IMPRESSION: No mammographic evidence of malignancy. A result letter of this screening mammogram will be mailed directly to the patient.  RECOMMENDATION: Screening mammogram in one year. (Code:SM-B-01Y)  BI-RADS CATEGORY  1: Negative   Electronically Signed   By: Conchita Paris M.D.   On: 05/02/2013 13:15

## 2013-05-23 NOTE — Assessment & Plan Note (Signed)
She is well compensated. I will not restart lasix at this time. No evidence of fluid overload. BMET will be completed today.

## 2013-05-23 NOTE — Patient Instructions (Addendum)
Your physician recommends that you schedule a follow-up appointment in: 3 months  Your physician has recommended you make the following change in your medication:  1. Amiodarone: Continue 200 mg twice a day until 05/31/13. Then decrease to 200 mg daily.  Your physician recommends that you return for lab work in: BMET and CBC today

## 2013-05-23 NOTE — Progress Notes (Signed)
HPI: [name] is a 68 year old patient of Dr. Johnsie Dillon we are following for ongoing assessment and management of CAD, atrial fibrillation with a chads score of 3, ischemic heart myopathy with an EF of 20-25% with other history to include COPD stage III chronic kidney disease and ongoing tobacco abuse.   The patient was recently admitted to [facility] in December of 2014 for ACS. Underwent cardiac catheterization revealing multivessel CAD with chronic occlusion of the OM and RCA, diffuse LAD stenosis. Medical management was recommended due to comorbidities and family request after lengthy discussion with Dr. Percival Dillon. During hospitalization the patient had new onset atrial fibrillation initially placed on heparin and amiodarone. He had a TEE guided cardioversion on 05/08/2013 which was successful. He was continued on amiodarone 200 mg twice a day with reduction to 200 mg daily after 2 weeks, and continued on apixaban. Lasix was held due to euvolemic state, with discussion and need to re\re institute appointments. He was continued on ACE inhibitor, aspirin and statin.   She has no complaints today. She is denies chest pain or palpitations, excessive bruising or epistaxis.         No Known Allergies  Current Outpatient Prescriptions  Medication Sig Dispense Refill  . acetaminophen (TYLENOL) 325 MG tablet Take 2 tablets (650 mg total) by mouth every 6 (six) hours as needed for mild pain or fever.      Marland Kitchen albuterol (PROVENTIL) (2.5 MG/3ML) 0.083% nebulizer solution Take 2.5 mg by nebulization every 6 (six) hours as needed for wheezing or shortness of breath.      . ALPRAZolam (XANAX) 0.25 MG tablet       . amiodarone (PACERONE) 200 MG tablet Take 200 mg by mouth daily. Reduce to 200 mg daily from 05/23/2013      . apixaban (ELIQUIS) 2.5 MG TABS tablet Take 1 tablet (2.5 mg total) by mouth 2 (two) times daily.  60 tablet  0  . aspirin EC 81 MG EC tablet Take 1 tablet (81 mg total) by mouth daily.        Marland Kitchen atorvastatin (LIPITOR) 80 MG tablet Take 1 tablet (80 mg total) by mouth daily at 6 PM.  30 tablet  0  . feeding supplement, RESOURCE BREEZE, (RESOURCE BREEZE) LIQD Take 1 Container by mouth daily.      Marland Kitchen guaiFENesin (MUCINEX) 600 MG 12 hr tablet Take 1 tablet (600 mg total) by mouth 2 (two) times daily.  15 tablet  0  . HYDROcodone-acetaminophen (NORCO/VICODIN) 5-325 MG per tablet       . ipratropium (ATROVENT HFA) 17 MCG/ACT inhaler Inhale 1 puff into the lungs every 6 (six) hours as needed for wheezing.      . pantoprazole (PROTONIX) 40 MG tablet Take 40 mg by mouth daily.      . polyethylene glycol (MIRALAX / GLYCOLAX) packet Take 17 g by mouth 2 (two) times daily.      . predniSONE (DELTASONE) 10 MG tablet Take 1 tablet (10 mg total) by mouth daily with breakfast.  3 tablet  0  . senna (SENOKOT) 8.6 MG TABS tablet Take 2 tablets (17.2 mg total) by mouth daily.      . trandolapril (MAVIK) 4 MG tablet Take 0.5 tablets (2 mg total) by mouth daily.  30 tablet  0   No current facility-administered medications for this visit.    Past Medical History  Diagnosis Date  . COPD (chronic obstructive pulmonary disease)   . Tobacco abuse   .  Hypertension   . History of atrial septal defect     Closure in 50's   . Mental retardation, idiopathic mild   . Arthritis     Past Surgical History  Procedure Laterality Date  . Asd repair      in her 34s  . Total hip arthroplasty Right     in her 67s  . Total abdominal hysterectomy      in the 8s  . Tee without cardioversion N/A 05/08/2013    Procedure: TRANSESOPHAGEAL ECHOCARDIOGRAM (TEE);  Surgeon: Lelon Perla, MD;  Location: [name] ENDOSCOPY;  Service: Cardiovascular;  Laterality: N/A;  . Cardioversion N/A 05/08/2013    Procedure: CARDIOVERSION;  Surgeon: Lelon Perla, MD;  Location: [facility] ENDOSCOPY;  Service: Cardiovascular;  Laterality: N/A;  Dr. Zoila Shutter verified with EP pt in a flutter...will cardiovert...  150 joules @ 4:40, using  Propofol 40  mg/IV .Marland Kitchen.successful NSR     DZH:GDJMEQ of systems complete and found to be negative unless listed above  PHYSICAL EXAM BP 101/53  Pulse 68  Ht 5\' 3"  (1.6 m)  Wt 98 lb 12 oz (44.793 kg)  BMI 17.50 kg/m2  General: Well developed, well nourished, in no acute distress Head: Eyes PERRLA, No xanthomas.   Normal cephalic and atramatic  Lungs: Clear bilaterally to auscultation and percussion. Heart: HRRR S1 S2, without MRG.  Pulses are 2+ & equal.            No carotid bruit. No JVD.  No abdominal bruits. No femoral bruits. Abdomen: Bowel sounds are positive, abdomen soft and non-tender without masses or                  Hernia's noted. Msk:  Back normal, normal gait. Normal strength and tone for age. Extremities: No clubbing, cyanosis or edema.  DP +1 Old ecchymotic sites from IV access. Neuro: Alert and oriented X 3. Psych:  Good affect, responds appropriately  EKG: NSR rate of 68 bpm.Incomplete RBBB.   ASSESSMENT AND PLAN

## 2013-05-24 LAB — BASIC METABOLIC PANEL
BUN: 23 mg/dL (ref 6–23)
CHLORIDE: 100 meq/L (ref 96–112)
CO2: 26 mEq/L (ref 19–32)
CREATININE: 1.53 mg/dL — AB (ref 0.50–1.10)
Calcium: 8.9 mg/dL (ref 8.4–10.5)
Glucose, Bld: 110 mg/dL — ABNORMAL HIGH (ref 70–99)
Potassium: 5.8 mEq/L — ABNORMAL HIGH (ref 3.5–5.3)
Sodium: 134 mEq/L — ABNORMAL LOW (ref 135–145)

## 2013-06-01 ENCOUNTER — Encounter: Payer: Self-pay | Admitting: Adult Health

## 2013-08-31 NOTE — Progress Notes (Signed)
HPI: Alexis Dillon is a 68 year old patient of Dr. Johnsie Cancel we follow for ongoing assessment and management of CAD, atrial fibrillation, ischemic cardiomyopathy with an EF of 20-25%, with ongoing history of COPD, chronic stage III chronic kidney disease and ongoing tobacco abuse.  Patient was last seen in the office in January 2015, after recent cardiac catheterization. This revealed multivessel CAD with chronic occlusion of the OM and the right coronary artery. Diffuse LAD stenosis. Medical management was recommended due to comorbidities and family request after lengthy discussion with Dr. Warren Lacy.  Most recent labs were completed in January 2013 with a sodium of 134 potassium of 5.8 chloride 100 CO2 26 BUN 23 creatinine 1.58. Hemoglobin 12.7 hematocrit 37.5 white blood cells 12.4 platelets 239.   She comes today with no idea what medications she is taking or if she took them today. Her nephew who is with her is not her main caretaker. She states that she doesn 't take BP meds anymore because they do not go with her blood thinner. She is also eating salty foods at home.   She denies chest pain or shortness of breath. She is limited on her ambulation due to bad knees and uses a cane for support.  No Known Allergies  Current Outpatient Prescriptions  Medication Sig Dispense Refill  . acetaminophen (TYLENOL) 325 MG tablet Take 2 tablets (650 mg total) by mouth every 6 (six) hours as needed for mild pain or fever.      Marland Kitchen albuterol (PROVENTIL) (2.5 MG/3ML) 0.083% nebulizer solution Take 2.5 mg by nebulization every 6 (six) hours as needed for wheezing or shortness of breath.      . ALPRAZolam (XANAX) 0.25 MG tablet       . amiodarone (PACERONE) 200 MG tablet Continue 200 mg twice a day until 05/31/13. Then decrease to 200 mg daily.  60 tablet  1  . apixaban (ELIQUIS) 2.5 MG TABS tablet Take 1 tablet (2.5 mg total) by mouth 2 (two) times daily.  60 tablet  0  . aspirin EC 81 MG EC tablet Take 1  tablet (81 mg total) by mouth daily.      Marland Kitchen atorvastatin (LIPITOR) 80 MG tablet Take 1 tablet (80 mg total) by mouth daily at 6 PM.  30 tablet  0  . feeding supplement, RESOURCE BREEZE, (RESOURCE BREEZE) LIQD Take 1 Container by mouth daily.      Marland Kitchen guaiFENesin (MUCINEX) 600 MG 12 hr tablet Take 1 tablet (600 mg total) by mouth 2 (two) times daily.  15 tablet  0  . HYDROcodone-acetaminophen (NORCO/VICODIN) 5-325 MG per tablet       . ipratropium (ATROVENT HFA) 17 MCG/ACT inhaler Inhale 1 puff into the lungs every 6 (six) hours as needed for wheezing.      . pantoprazole (PROTONIX) 40 MG tablet Take 40 mg by mouth daily.      . polyethylene glycol (MIRALAX / GLYCOLAX) packet Take 17 g by mouth 2 (two) times daily.      . predniSONE (DELTASONE) 10 MG tablet Take 1 tablet (10 mg total) by mouth daily with breakfast.  3 tablet  0  . senna (SENOKOT) 8.6 MG TABS tablet Take 2 tablets (17.2 mg total) by mouth daily.      . trandolapril (MAVIK) 4 MG tablet Take 0.5 tablets (2 mg total) by mouth daily.  30 tablet  0   No current facility-administered medications for this visit.    Past Medical History  Diagnosis Date  .  COPD (chronic obstructive pulmonary disease)   . Tobacco abuse   . Hypertension   . History of atrial septal defect     Closure in 50's   . Mental retardation, idiopathic mild   . Arthritis     Past Surgical History  Procedure Laterality Date  . Asd repair      in her 17s  . Total hip arthroplasty Right     in her 2s  . Total abdominal hysterectomy      in the 31s  . Tee without cardioversion N/A 05/08/2013    Procedure: TRANSESOPHAGEAL ECHOCARDIOGRAM (TEE);  Surgeon: Lelon Perla, MD;  Location[facility] ENDOSCOPY;  Service: Cardiovascular;  Laterality: N/A;  . Cardioversion N/A 05/08/2013    Procedure: CARDIOVERSION;  Surgeon: Lelon Perla, MD;  Location: [facility] ENDOSCOPY;  Service: Cardiovascular;  Laterality: N/A;  Dr. Zoila Shutter verified with EP pt in a flutter...will  cardiovert...  150 joules @ 4:40, using Propofol 40  mg/IV .Marland Kitchen.successful NSR     ROS: Review of systems complete and found to be negative unless listed above  PHYSICAL EXAM There were no vitals taken for this visit. General: Well developed, well nourished, in no acute distress Head: Eyes PERRLA, No xanthomas.   Normal cephalic and atramatic  Lungs: Clear bilaterally to auscultation and percussion. Heart: IRRR S1 S2, without MRG.  Pulses are 2+ & equal.            No carotid bruit. No JVD.  No abdominal bruits. No femoral bruits. Abdomen: Bowel sounds are positive, abdomen soft and non-tender without masses or                  Hernia's noted. Msk:  Back normal, normal gait, uses cane for ambulation.  Normal strength and tone for age. Extremities: No clubbing, cyanosis or edema.  DP +1 Neuro: Alert and oriented X 3. Hard of hearing. Psych:  Good affect, responds appropriately.   ASSESSMENT AND PLAN

## 2013-09-03 ENCOUNTER — Encounter: Payer: Self-pay | Admitting: Adult Health

## 2013-09-03 ENCOUNTER — Ambulatory Visit (INDEPENDENT_AMBULATORY_CARE_PROVIDER_SITE_OTHER): Payer: 59 | Admitting: Adult Health

## 2013-09-03 VITALS — BP 143/76 | HR 72 | Ht 60.0 in | Wt 108.0 lb

## 2013-09-03 DIAGNOSIS — I10 Essential (primary) hypertension: Secondary | ICD-10-CM

## 2013-09-03 DIAGNOSIS — F17200 Nicotine dependence, unspecified, uncomplicated: Secondary | ICD-10-CM

## 2013-09-03 DIAGNOSIS — I249 Acute ischemic heart disease, unspecified: Secondary | ICD-10-CM

## 2013-09-03 DIAGNOSIS — Z720 Tobacco use: Secondary | ICD-10-CM

## 2013-09-03 DIAGNOSIS — I4891 Unspecified atrial fibrillation: Secondary | ICD-10-CM

## 2013-09-03 DIAGNOSIS — I2589 Other forms of chronic ischemic heart disease: Secondary | ICD-10-CM

## 2013-09-03 DIAGNOSIS — I509 Heart failure, unspecified: Secondary | ICD-10-CM

## 2013-09-03 DIAGNOSIS — I255 Ischemic cardiomyopathy: Secondary | ICD-10-CM

## 2013-09-03 DIAGNOSIS — I200 Unstable angina: Secondary | ICD-10-CM

## 2013-09-03 NOTE — Assessment & Plan Note (Signed)
Blood pressure is currently not well controlled for systolic dysfunction. She is on trandolopril but states she does not take it. I have advised her to take her medications daily as directed. She will be seen again in 3 months and be established with Dr. Bronson Ing. Medication adjustments are to be managed.

## 2013-09-03 NOTE — Assessment & Plan Note (Signed)
Heart rate is well controlled.She appears to be tolerating eliquis well without overt bleeding symptoms.  She is steady on her feet. Will continue amiodarone, Eliquis. Not on BB due to COPD.

## 2013-09-03 NOTE — Assessment & Plan Note (Signed)
I have had a long discussion with the patient and her nephew concerning her need to be compliant with her diet and to take her medications as directed. She has a niece who cares for her, cooks, and gives her the medications each day.   I have asked her to bring her medications with her on next visit. She is given a low sodium 1500 mg diet. BMET is ordered.

## 2013-09-03 NOTE — Assessment & Plan Note (Signed)
The patient is continued on current medication regimen. I expressed to her the importance of her taking her medicine every day. She is uncertain what she is taking or how often. She has a niece who is caring for her, and believes that she gets her her medicine as needed. She should bring her medications with her followup appointments that we may adjust as necessary. She is to avoid salty foods. She has stopped smoking.

## 2013-09-03 NOTE — Progress Notes (Deleted)
Name: Alexis Dillon    DOB: 1945-06-30  Age: 68 y.o.  MR#: [id number]       PCP:  Alonza Bogus, MD      Insurance: Payor: Theme park manager / Plan: Theme park manager / Product Type: *No Product type* /   CC:    Chief Complaint  Patient presents with  . Coronary Artery Disease  . Atrial Fibrillation    VS Filed Vitals:   09/03/13 1502  BP: 143/76  Pulse: 72  Height: 5' (1.524 m)  Weight: 108 lb (48.988 kg)    Weights Current Weight  09/03/13 108 lb (48.988 kg)  05/23/13 98 lb 12 oz (44.793 kg)  05/11/13 100 lb 1.4 oz (45.4 kg)    Blood Pressure  BP Readings from Last 3 Encounters:  09/03/13 143/76  05/23/13 101/53  05/11/13 126/56     Admit date:  (Not on file) Last encounter with RMR:  05/23/2013   Allergy Review of patient's allergies indicates no known allergies.  Current Outpatient Prescriptions  Medication Sig Dispense Refill  . acetaminophen (TYLENOL) 325 MG tablet Take 2 tablets (650 mg total) by mouth every 6 (six) hours as needed for mild pain or fever.      Marland Kitchen albuterol (PROVENTIL) (2.5 MG/3ML) 0.083% nebulizer solution Take 2.5 mg by nebulization every 6 (six) hours as needed for wheezing or shortness of breath.      . ALPRAZolam (XANAX) 0.25 MG tablet       . amiodarone (PACERONE) 200 MG tablet Continue 200 mg twice a day until 05/31/13. Then decrease to 200 mg daily.  60 tablet  1  . apixaban (ELIQUIS) 2.5 MG TABS tablet Take 1 tablet (2.5 mg total) by mouth 2 (two) times daily.  60 tablet  0  . aspirin EC 81 MG EC tablet Take 1 tablet (81 mg total) by mouth daily.      Marland Kitchen atorvastatin (LIPITOR) 80 MG tablet Take 1 tablet (80 mg total) by mouth daily at 6 PM.  30 tablet  0  . feeding supplement, RESOURCE BREEZE, (RESOURCE BREEZE) LIQD Take 1 Container by mouth daily.      Marland Kitchen guaiFENesin (MUCINEX) 600 MG 12 hr tablet Take 1 tablet (600 mg total) by mouth 2 (two) times daily.  15 tablet  0  . HYDROcodone-acetaminophen (NORCO/VICODIN) 5-325 MG per tablet       .  ipratropium (ATROVENT HFA) 17 MCG/ACT inhaler Inhale 1 puff into the lungs every 6 (six) hours as needed for wheezing.      . pantoprazole (PROTONIX) 40 MG tablet Take 40 mg by mouth daily.      . polyethylene glycol (MIRALAX / GLYCOLAX) packet Take 17 g by mouth 2 (two) times daily.      . predniSONE (DELTASONE) 10 MG tablet Take 1 tablet (10 mg total) by mouth daily with breakfast.  3 tablet  0  . senna (SENOKOT) 8.6 MG TABS tablet Take 2 tablets (17.2 mg total) by mouth daily.      . trandolapril (MAVIK) 4 MG tablet Take 0.5 tablets (2 mg total) by mouth daily.  30 tablet  0   No current facility-administered medications for this visit.    Discontinued Meds:   There are no discontinued medications.  Patient Active Problem List   Diagnosis Date Noted  . Hyperkalemia 05/09/2013  . Cardiomyopathy, ischemic 05/07/2013  . Elevated LFTs 05/06/2013  . Atrial fibrillation 05/06/2013  . CKD (chronic kidney disease), stage III 05/05/2013  . AKI (acute kidney injury)  05/05/2013  . Acute systolic CHF (congestive heart failure) 05/04/2013  . Acute coronary syndrome 05/03/2013  . CHF (congestive heart failure) 05/02/2013  . Acute respiratory failure 05/02/2013  . COPD (chronic obstructive pulmonary disease)   . Tobacco abuse   . Hypertension   . History of atrial septal defect   . Mental retardation, idiopathic mild   . DEGENERATIVE JOINT DISEASE, RIGHT KNEE 07/28/2009  . LOW BACK PAIN 11/15/2007  . DEGENERATIVE JOINT DISEASE, RIGHT HIP 03/06/2007  . TOTAL HIP FOLLOW-UP 01/24/2007  . HIGH BLOOD PRESSURE 12/16/2006    LABS    Component Value Date/Time   NA 134* 05/23/2013 1404   NA 136 05/11/2013 0415   NA 137 05/10/2013 0547   K 5.8* 05/23/2013 1404   K 4.8 05/11/2013 0415   K 4.9 05/10/2013 0547   CL 100 05/23/2013 1404   CL 94* 05/11/2013 0415   CL 96 05/10/2013 0547   CO2 26 05/23/2013 1404   CO2 31 05/11/2013 0415   CO2 30 05/10/2013 0547   GLUCOSE 110* 05/23/2013 1404   GLUCOSE  105* 05/11/2013 0415   GLUCOSE 103* 05/10/2013 0547   BUN 23 05/23/2013 1404   BUN 35* 05/11/2013 0415   BUN 37* 05/10/2013 0547   CREATININE 1.53* 05/23/2013 1404   CREATININE 1.54* 05/11/2013 0415   CREATININE 1.40* 05/10/2013 0547   CREATININE 1.52* 05/09/2013 0340   CALCIUM 8.9 05/23/2013 1404   CALCIUM 8.3* 05/11/2013 0415   CALCIUM 8.4 05/10/2013 0547   GFRNONAA 34* 05/11/2013 0415   GFRNONAA 38* 05/10/2013 0547   GFRNONAA 34* 05/09/2013 0340   GFRAA 39* 05/11/2013 0415   GFRAA 44* 05/10/2013 0547   GFRAA 40* 05/09/2013 0340   CMP     Component Value Date/Time   NA 134* 05/23/2013 1404   K 5.8* 05/23/2013 1404   CL 100 05/23/2013 1404   CO2 26 05/23/2013 1404   GLUCOSE 110* 05/23/2013 1404   BUN 23 05/23/2013 1404   CREATININE 1.53* 05/23/2013 1404   CREATININE 1.54* 05/11/2013 0415   CALCIUM 8.9 05/23/2013 1404   PROT 6.4 05/06/2013 1100   ALBUMIN 3.5 05/06/2013 1100   AST 35 05/06/2013 1100   ALT 48* 05/06/2013 1100   ALKPHOS 102 05/06/2013 1100   BILITOT 0.2* 05/06/2013 1100   GFRNONAA 34* 05/11/2013 0415   GFRAA 39* 05/11/2013 0415       Component Value Date/Time   WBC 12.4* 05/23/2013 1404   WBC 15.9* 05/11/2013 0415   WBC 14.1* 05/10/2013 0547   HGB 12.7 05/23/2013 1404   HGB 14.2 05/11/2013 0415   HGB 14.2 05/10/2013 0547   HCT 37.5 05/23/2013 1404   HCT 43.2 05/11/2013 0415   HCT 42.7 05/10/2013 0547   MCV 91.0 05/23/2013 1404   MCV 95.6 05/11/2013 0415   MCV 94.1 05/10/2013 0547    Lipid Panel  No results found for this basename: chol, trig, hdl, cholhdl, vldl, ldlcalc    ABG    Component Value Date/Time   PHART 7.452* 05/04/2013 1002   PCO2ART 40.1 05/04/2013 1002   PO2ART 84.0 05/04/2013 1002   HCO3 28.0* 05/04/2013 1002   TCO2 29 05/04/2013 1002   ACIDBASEDEF 0.4 05/02/2013 0306   O2SAT 97.0 05/04/2013 1002     Lab Results  Component Value Date   TSH 1.577 05/02/2013   BNP (last 3 results)  Recent Labs  05/02/13 0236  PROBNP 11638.0*   Cardiac  Panel (last 3 results) No results found for this basename: CKTOTAL,  CKMB, TROPONINI, RELINDX,  in the last 72 hours  Iron/TIBC/Ferritin No results found for this basename: iron, tibc, ferritin     EKG Orders placed in visit on 05/23/13  . EKG 12-LEAD     Prior Assessment and Plan Problem List as of 09/03/2013     Cardiovascular and Mediastinum   CHF (congestive heart failure)   Last Assessment & Plan   05/23/2013 Office Visit Written 05/23/2013  2:05 PM by Lendon Colonel, NP     She is well compensated. I will not restart lasix at this time. No evidence of fluid overload. BMET will be completed today.     Hypertension   Acute coronary syndrome   Acute systolic CHF (congestive heart failure)   Atrial fibrillation   Last Assessment & Plan   05/23/2013 Office Visit Written 05/23/2013  2:07 PM by Lendon Colonel, NP     Remains in NSR, with amiodarone. She will continue on BID dosing for one more week, and then change to daily dosing of 200 mg on 05/31/2012. I will check a CBC and hemoccult stools as she is on Eliquis for CVA prophylaxis. See her again in 3 months.    Cardiomyopathy, ischemic     Respiratory   COPD (chronic obstructive pulmonary disease)   Acute respiratory failure     Musculoskeletal and Integument   DEGENERATIVE JOINT DISEASE, RIGHT HIP   DEGENERATIVE JOINT DISEASE, RIGHT KNEE     Genitourinary   CKD (chronic kidney disease), stage III   AKI (acute kidney injury)     Other   LOW BACK PAIN   HIGH BLOOD PRESSURE   Last Assessment & Plan   05/23/2013 Office Visit Written 05/23/2013  2:06 PM by Lendon Colonel, NP     Doing well. No changes in medication regimen at this time.    TOTAL HIP FOLLOW-UP   Tobacco abuse   History of atrial septal defect   Mental retardation, idiopathic mild   Elevated LFTs   Hyperkalemia       Imaging: No results found.

## 2013-09-03 NOTE — Assessment & Plan Note (Signed)
She has stopped smoking. She is congratulated on her lifestyle change encouraged to continue this healthy lifestyle.

## 2013-09-03 NOTE — Patient Instructions (Signed)
Your physician recommends that you schedule a follow-up appointment in: 4 months with Dr Virgina Jock will receive a reminder letter two months in advance reminding you to call and schedule your appointment. If you don't receive this letter, please contact our office.\  Your physician recommends that you return for lab work this week. CBC, BMET, LIPIDS, LFTs  1.5 Gram Low Sodium Diet A 1.5 gram sodium diet restricts the amount of sodium in the diet to no more than 1.5 g or 1500 mg daily. The American Heart Association recommends Americans over the age of 80 to consume no more than 1500 mg of sodium each day to reduce the risk of developing high blood pressure. Research also shows that limiting sodium may reduce heart attack and stroke risk. Many foods contain sodium for flavor and sometimes as a preservative. When the amount of sodium in a diet needs to be low, it is important to know what to look for when choosing foods and drinks. The following includes some information and guidelines to help make it easier for you to adapt to a low sodium diet. QUICK TIPS  Do not add salt to food.  Avoid convenience items and fast food.  Choose unsalted snack foods.  Buy lower sodium products, often labeled as "lower sodium" or "no salt added."  Check food labels to learn how much sodium is in 1 serving.  When eating at a restaurant, ask that your food be prepared with less salt or none, if possible. READING FOOD LABELS FOR SODIUM INFORMATION The nutrition facts label is a good place to find how much sodium is in foods. Look for products with no more than 400 mg of sodium per serving. Remember that 1.5 g = 1500 mg. The food label may also list foods as:  Sodium-free: Less than 5 mg in a serving.  Very low sodium: 35 mg or less in a serving.  Low-sodium: 140 mg or less in a serving.  Light in sodium: 50% less sodium in a serving. For example, if a food that usually has 300 mg of sodium is changed to  become light in sodium, it will have 150 mg of sodium.  Reduced sodium: 25% less sodium in a serving. For example, if a food that usually has 400 mg of sodium is changed to reduced sodium, it will have 300 mg of sodium. CHOOSING FOODS Grains  Avoid: Salted crackers and snack items. Some cereals, including instant hot cereals. Bread stuffing and biscuit mixes. Seasoned rice or pasta mixes.  Choose: Unsalted snack items. Low-sodium cereals, oats, puffed wheat and rice, shredded wheat. English muffins and bread. Pasta. Meats  Avoid: Salted, canned, smoked, spiced, pickled meats, including fish and poultry. Bacon, ham, sausage, cold cuts, hot dogs, anchovies.  Choose: Low-sodium canned tuna and salmon. Fresh or frozen meat, poultry, and fish. Dairy  Avoid: Processed cheese and spreads. Cottage cheese. Buttermilk and condensed milk. Regular cheese.  Choose: Milk. Low-sodium cottage cheese. Yogurt. Sour cream. Low-sodium cheese. Fruits and Vegetables  Avoid: Regular canned vegetables. Regular canned tomato sauce and paste. Frozen vegetables in sauces. Olives. Angie Fava. Relishes. Sauerkraut.  Choose: Low-sodium canned vegetables. Low-sodium tomato sauce and paste. Frozen or fresh vegetables. Fresh and frozen fruit. Condiments  Avoid: Canned and packaged gravies. Worcestershire sauce. Tartar sauce. Barbecue sauce. Soy sauce. Steak sauce. Ketchup. Onion, garlic, and table salt. Meat flavorings and tenderizers.  Choose: Fresh and dried herbs and spices. Low-sodium varieties of mustard and ketchup. Lemon juice. Tabasco sauce. Horseradish. SAMPLE 1.5  GRAM SODIUM MEAL PLAN Breakfast / Sodium (mg)  1 cup low-fat milk / 143 mg  1 whole-wheat English muffin / 240 mg  1 tbs heart-healthy margarine / 153 mg  1 hard-boiled egg / 139 mg  1 small orange / 0 mg Lunch / Sodium (mg)  1 cup raw carrots / 76 mg  2 tbs no salt added peanut butter / 5 mg  2 slices whole-wheat bread / 270 mg  1  tbs jelly / 6 mg   cup red grapes / 2 mg Dinner / Sodium (mg)  1 cup whole-wheat pasta / 2 mg  1 cup low-sodium tomato sauce / 73 mg  3 oz lean ground beef / 57 mg  1 small side salad (1 cup raw spinach leaves,  cup cucumber,  cup yellow bell pepper) with 1 tsp olive oil and 1 tsp red wine vinegar / 25 mg Snack / Sodium (mg)  1 container low-fat vanilla yogurt / 107 mg  3 graham cracker squares / 127 mg Nutrient Analysis  Calories: 1745  Protein: 75 g  Carbohydrate: 237 g  Fat: 57 g  Sodium: 1425 mg Document Released: 05/03/2005 Document Revised: 07/26/2011 Document Reviewed: 08/04/2009 Exit[employer] Patient Information 2014 [place], [place].

## 2013-09-04 LAB — HEPATIC FUNCTION PANEL
ALBUMIN: 3.7 g/dL (ref 3.5–5.2)
ALT: 92 U/L — ABNORMAL HIGH (ref 0–35)
AST: 87 U/L — ABNORMAL HIGH (ref 0–37)
Alkaline Phosphatase: 778 U/L — ABNORMAL HIGH (ref 39–117)
Bilirubin, Direct: 0.3 mg/dL (ref 0.0–0.3)
Indirect Bilirubin: 0.5 mg/dL (ref 0.2–1.2)
TOTAL PROTEIN: 6.7 g/dL (ref 6.0–8.3)
Total Bilirubin: 0.8 mg/dL (ref 0.2–1.2)

## 2013-09-04 LAB — LIPID PANEL
Cholesterol: 242 mg/dL — ABNORMAL HIGH (ref 0–200)
HDL: 110 mg/dL (ref >39)
LDL CALC: 103 mg/dL — AB (ref 0–99)
TRIGLYCERIDES: 144 mg/dL (ref <150)
Total CHOL/HDL Ratio: 2.2 Ratio
VLDL: 29 mg/dL (ref 0–40)

## 2013-09-04 LAB — BASIC METABOLIC PANEL
BUN: 20 mg/dL (ref 6–23)
CALCIUM: 9.1 mg/dL (ref 8.4–10.5)
CO2: 23 meq/L (ref 19–32)
Chloride: 108 mEq/L (ref 96–112)
Creat: 1.91 mg/dL — ABNORMAL HIGH (ref 0.50–1.10)
Glucose, Bld: 90 mg/dL (ref 70–99)
Potassium: 4.5 mEq/L (ref 3.5–5.3)
SODIUM: 145 meq/L (ref 135–145)

## 2013-09-04 LAB — CBC
HEMATOCRIT: 31.6 % — AB (ref 36.0–46.0)
HEMOGLOBIN: 10 g/dL — AB (ref 12.0–15.0)
MCH: 26 pg (ref 26.0–34.0)
MCHC: 31.6 g/dL (ref 30.0–36.0)
MCV: 82.3 fL (ref 78.0–100.0)
Platelets: 381 10*3/uL (ref 150–400)
RBC: 3.84 MIL/uL — ABNORMAL LOW (ref 3.87–5.11)
RDW: 18 % — ABNORMAL HIGH (ref 11.5–15.5)
WBC: 11.3 10*3/uL — AB (ref 4.0–10.5)

## 2013-10-05 ENCOUNTER — Encounter: Payer: Self-pay | Admitting: Gastroenterology

## 2013-10-11 ENCOUNTER — Encounter: Payer: Self-pay | Admitting: Gastroenterology

## 2013-10-11 ENCOUNTER — Telehealth: Payer: Self-pay

## 2013-10-11 ENCOUNTER — Ambulatory Visit (INDEPENDENT_AMBULATORY_CARE_PROVIDER_SITE_OTHER): Payer: PRIVATE HEALTH INSURANCE | Admitting: Gastroenterology

## 2013-10-11 ENCOUNTER — Encounter (INDEPENDENT_AMBULATORY_CARE_PROVIDER_SITE_OTHER): Payer: Self-pay

## 2013-10-11 ENCOUNTER — Other Ambulatory Visit: Payer: Self-pay | Admitting: Gastroenterology

## 2013-10-11 ENCOUNTER — Encounter (HOSPITAL_COMMUNITY): Payer: Self-pay | Admitting: Pharmacy Technician

## 2013-10-11 VITALS — BP 145/71 | HR 80 | Temp 98.4°F | Ht 62.0 in | Wt 110.8 lb

## 2013-10-11 DIAGNOSIS — Z800 Family history of malignant neoplasm of digestive organs: Secondary | ICD-10-CM

## 2013-10-11 DIAGNOSIS — K5900 Constipation, unspecified: Secondary | ICD-10-CM | POA: Diagnosis not present

## 2013-10-11 DIAGNOSIS — D649 Anemia, unspecified: Secondary | ICD-10-CM

## 2013-10-11 DIAGNOSIS — R7989 Other specified abnormal findings of blood chemistry: Secondary | ICD-10-CM | POA: Diagnosis not present

## 2013-10-11 DIAGNOSIS — R945 Abnormal results of liver function studies: Principal | ICD-10-CM

## 2013-10-11 DIAGNOSIS — R195 Other fecal abnormalities: Secondary | ICD-10-CM

## 2013-10-11 LAB — HEPATIC FUNCTION PANEL
ALK PHOS: 117 U/L (ref 39–117)
ALT: 9 U/L (ref 0–35)
AST: 14 U/L (ref 0–37)
Albumin: 3.2 g/dL — ABNORMAL LOW (ref 3.5–5.2)
Total Bilirubin: 0.2 mg/dL — ABNORMAL LOW (ref 0.3–1.2)
Total Protein: 6.3 g/dL (ref 6.0–8.3)

## 2013-10-11 LAB — CBC WITH DIFFERENTIAL/PLATELET
Basophils Absolute: 0 10*3/uL (ref 0.0–0.1)
Basophils Relative: 0 % (ref 0–1)
EOS PCT: 0 % (ref 0–5)
Eosinophils Absolute: 0 10*3/uL (ref 0.0–0.7)
HEMATOCRIT: 28.8 % — AB (ref 36.0–46.0)
Hemoglobin: 8.1 g/dL — ABNORMAL LOW (ref 12.0–15.0)
LYMPHS ABS: 2.2 10*3/uL (ref 0.7–4.0)
Lymphocytes Relative: 17 % (ref 12–46)
MCH: 24.4 pg — AB (ref 26.0–34.0)
MCHC: 28.1 g/dL — ABNORMAL LOW (ref 30.0–36.0)
MCV: 86.7 fL (ref 78.0–100.0)
MONO ABS: 1 10*3/uL (ref 0.1–1.0)
MONOS PCT: 8 % (ref 3–12)
Neutro Abs: 9.6 10*3/uL — ABNORMAL HIGH (ref 1.7–7.7)
Neutrophils Relative %: 75 % (ref 43–77)
PLATELETS: 426 10*3/uL — AB (ref 150–400)
RBC: 3.32 MIL/uL — AB (ref 3.87–5.11)
RDW: 17.7 % — ABNORMAL HIGH (ref 11.5–15.5)
WBC: 12.8 10*3/uL — ABNORMAL HIGH (ref 4.0–10.5)

## 2013-10-11 LAB — FERRITIN: Ferritin: 30 ng/mL (ref 10–291)

## 2013-10-11 LAB — IRON: Iron: 70 ug/dL (ref 42–145)

## 2013-10-11 MED ORDER — PEG 3350-KCL-NA BICARB-NACL 420 G PO SOLR: 4000.0000 mL | ORAL | Status: DC

## 2013-10-11 MED ORDER — LINACLOTIDE 145 MCG PO CAPS: 145.0000 ug | ORAL_CAPSULE | Freq: Every day | ORAL | Status: DC

## 2013-10-11 NOTE — Assessment & Plan Note (Signed)
During inpatient hospitalization several months ago with resolution, then again in April 2015 in the presence of a statin. Recheck today, proceed with [place] of abdomen. Statin has been stopped.

## 2013-10-11 NOTE — Assessment & Plan Note (Signed)
Start Linzess 145 mcg daily.  

## 2013-10-11 NOTE — Assessment & Plan Note (Addendum)
68 year old female with new onset anemia,heme positive stool in the setting of Eliquis for afib; no overt signs of GI bleeding currently, but she does note "black" stool a month or so ago in the setting of iron. Her niece verifies her report today, stating no evidence of GI bleeding currently. Poor historian. Unknown iron studies. Appears she has chronically taking Ibuprofen and occasional alka seltzers, but this has stopped recently. Question of occult NSAID injury. Needs further evaluation via TCS/EGD due to new onset anemia, heme positive stool. As of note, she has never had a colonoscopy, and there is a FH of colon cancer in brother, diagnosed in his early 37s.   Proceed with colonoscopy and upper endoscopy in the near future with Dr. Gala Romney. The risks, benefits, and alternatives have been discussed in detail with patient. They have stated understanding and desire to proceed.  Check iron, ferritin, repeat CBC Continue Protonix daily ON ELIQUIS: OK per cardiology to hold X 3 days prior. Will hold X 3 days.

## 2013-10-11 NOTE — Telephone Encounter (Signed)
I don not follow this patient  I saw her last in 2008  She was re established with Dr Acie Fredrickson in 05/02/13 and is followed in [place]

## 2013-10-11 NOTE — Telephone Encounter (Signed)
**Note De-Identified [name] Obfuscation** Will forward note to Dr Kyla Balzarine nurse Devra Dopp, LPN.

## 2013-10-11 NOTE — Telephone Encounter (Signed)
Dr. Johnsie Cancel,   This pt is being scheduled for a colonoscopy and EGD with Dr. Gala Romney soon. We would like to know if it will be OK to hold Eliquis for 48-72 hours prior to the procedures.  Please advise!

## 2013-10-11 NOTE — Progress Notes (Signed)
Primary Care Physician:  Alonza Bogus, MD Primary Gastroenterologist:  Dr. Gala Romney  Chief Complaint  Patient presents with  . Heme+ stool    HPI:   Alexis Dillon presents today at the request of Dr. Luan Pulling secondary to heme positive stool. She has a significant cardiac history to include CAD, afib, ischemic cardiomyopathy with EF of  20-25%, and she takes Eliquis for afib. Appears recent Hgb was 10, which is down from 12 in Jan 2015. She has also had a significant bump in her LFTs, with alk phos 778, AST 87, and ALT 92 in April 2015. This was in the presence of a statin, which was then stopped. Updated LFTs not available. Appears she has had another occasion of elevated LFTs in Dec 2014. However, during that time, she was inpatient for acute NSTEMI, with elevated LFTs more of a bystander reaction, and they quickly returned to baseline in several days.    She had a large ball of stool this morning without blood. Intermittent constipation. States her stool was pitch black awhile ago, but she had been on iron. Denies hematochezia. Miralax for constipation in past but has stopped. States she was given alka seltzer tablets while in the nursing home during bouts with indigestion. Protonix daily. Sometimes sneaks food and then throws it up. Niece present stating they are trying to provide a healthy diet for her, and patient will sneak food. Lives with her sister.  Occasional hoarseness, clearing throat. Stopped Ibuprofen; niece states used to take regularly multiple times a day. Good appetite.   Sometimes lower abdominal discomfort associated with constipation. Very difficult to keep focused on history, poor historian. Mild mental retardation. +FH of colon cancer in brother, deceased in his early 65s. No prior colonoscopy or EGD. Denies any known personal or family history of liver disease.   Past Medical History  Diagnosis Date  . COPD (chronic obstructive pulmonary disease)   . Tobacco abuse    . Hypertension   . History of atrial septal defect     Closure in 50's   . Mental retardation, idiopathic mild   . Arthritis   . NSTEMI (non-ST elevated myocardial infarction)   . A-fib     Past Surgical History  Procedure Laterality Date  . Asd repair      in her 65s  . Total hip arthroplasty Right     in her 86s  . Total abdominal hysterectomy      in the 39s  . Tee without cardioversion N/A 05/08/2013    Procedure: TRANSESOPHAGEAL ECHOCARDIOGRAM (TEE);  Surgeon: Lelon Perla, MD;  Location: [facility] ENDOSCOPY;  Service: Cardiovascular;  Laterality: N/A;  . Cardioversion N/A 05/08/2013    Procedure: CARDIOVERSION;  Surgeon: Lelon Perla, MD;  Location: [facility] ENDOSCOPY;  Service: Cardiovascular;  Laterality: N/A;  Dr. Zoila Shutter verified with EP pt in a flutter...will cardiovert...  150 joules @ 4:40, using Propofol 40  mg/IV .Marland Kitchen.successful NSR   . Tonsillectomy      Current Outpatient Prescriptions  Medication Sig Dispense Refill  . acetaminophen (TYLENOL) 325 MG tablet Take 2 tablets (650 mg total) by mouth every 6 (six) hours as needed for mild pain or fever.      Marland Kitchen albuterol (PROVENTIL) (2.5 MG/3ML) 0.083% nebulizer solution Take 2.5 mg by nebulization every 6 (six) hours as needed for wheezing or shortness of breath.      . ALPRAZolam (XANAX) 0.25 MG tablet       . amiodarone (PACERONE) 200  MG tablet Continue 200 mg twice a day until 05/31/13. Then decrease to 200 mg daily.  60 tablet  1  . apixaban (ELIQUIS) 2.5 MG TABS tablet Take 1 tablet (2.5 mg total) by mouth 2 (two) times daily.  60 tablet  0  . aspirin EC 81 MG EC tablet Take 1 tablet (81 mg total) by mouth daily.      . HYDROcodone-acetaminophen (NORCO/VICODIN) 5-325 MG per tablet       . ipratropium (ATROVENT HFA) 17 MCG/ACT inhaler Inhale 1 puff into the lungs every 6 (six) hours as needed for wheezing.      . pantoprazole (PROTONIX) 40 MG tablet Take 40 mg by mouth daily.      . predniSONE (DELTASONE) 10 MG tablet  Take 1 tablet (10 mg total) by mouth daily with breakfast.  3 tablet  0   No current facility-administered medications for this visit.    Allergies as of 10/11/2013  . (No Known Allergies)    Family History  Problem Relation Age of Onset  . Heart attack Brother   . Heart attack Brother   . Diabetes Mother   . Heart attack Father     Died at 42 from MI  . Heart attack Sister     stent at 65  . Heart attack Sister     stent at 58  . Cancer Brother     lung  . Cancer Brother     lung   . Colon cancer Brother     deceased, diagnosed in early 40s    History   Social History  . Marital Status: Divorced    Spouse Name: N/A    Number of Children: N/A  . Years of Education: N/A   Occupational History  . Not on file.   Social History Main Topics  . Smoking status: Former Smoker -- 1.00 packs/day for 55 years    Types: Cigarettes  . Smokeless tobacco: Never Used     Comment: stopped Dec 2014  . Alcohol Use: No  . Drug Use: No  . Sexual Activity: Not on file   Other Topics Concern  . Not on file   Social History Narrative   She has mild mental retardation. Patient not married and lives with her sister. Divorced with no children.     Review of Systems: Gen: see HPI CV: occasional palpitations Resp: +cough, +DOE GI: see HPI GU : Denies urinary burning, urinary frequency, urinary hesitancy MS: occasional joint pain Derm: Denies rash, itching, dry skin Psych:+ anxiety  Heme: Denies bruising, bleeding, and enlarged lymph nodes.  Physical Exam: BP 145/71  Pulse 80  Temp(Src) 98.4 F (36.9 C) (Oral)  Ht 5' 2" (1.575 m)  Wt 110 lb 12.8 oz (50.259 kg)  BMI 20.26 kg/m2 General:   Alert and oriented. Pleasant and cooperative. Well-nourished and well-developed. Difficult to keep focused but quite pleasant and talkative.  Head:  Normocephalic and atraumatic. Eyes:  Without icterus, sclera clear and conjunctiva pink.  Ears:  Normal auditory acuity. Nose:  No  deformity, discharge,  or lesions. Mouth:  No deformity or lesions, oral mucosa pink.  Neck:  Supple, without mass or thyromegaly. Lungs:  Wheezing posteriorly more pronounced than anterior Heart:  S1, S2 present without murmurs appreciated.  Abdomen:  +BS, soft, non-tender and non-distended. No HSM noted. No guarding or rebound. No masses appreciated.  Rectal:  Deferred  Msk:  Symmetrical without gross deformities. Normal posture. Extremities:  Without clubbing or edema. Neurologic:    Alert and  oriented x4;  grossly normal neurologically. Skin:  Intact without significant lesions or rashes. Psych:  Alert and cooperative. Normal mood and affect.   Component     Latest Ref Rng 09/04/2013          WBC     4.0 - 10.5 K/uL 11.3 (H)  RBC     3.87 - 5.11 MIL/uL 3.84 (L)  Hemoglobin     12.0 - 15.0 g/dL 10.0 (L)  HCT     36.0 - 46.0 % 31.6 (L)  MCV     78.0 - 100.0 fL 82.3  MCH     26.0 - 34.0 pg 26.0  MCHC     30.0 - 36.0 g/dL 31.6  RDW     11.5 - 15.5 % 18.0 (H)  Platelets     150 - 400 K/uL 381   Component     Latest Ref Rng 05/02/2013 05/06/2013 09/04/2013  Total Protein     6.0 - 8.3 g/dL 7.1 6.4 6.7  Albumin     3.5 - 5.2 g/dL 4.0 3.5 3.7  AST     0 - 37 U/L 255 (H) 35 87 (H)  ALT     0 - 35 U/L 175 (H) 48 (H) 92 (H)  Alkaline Phosphatase     39 - 117 U/L 192 (H) 102 778 (H)  Total Bilirubin     0.3 - 1.2 mg/dL 0.3 0.2 (L) 0.8  Bilirubin, Direct     0.0 - 0.3 mg/dL  0.1 0.3  Indirect Bilirubin     0.2 - 1.2 mg/dL  0.1 (L) 0.5

## 2013-10-11 NOTE — Patient Instructions (Signed)
For constipation: start taking Linzess 1 capsule each morning, 30 minutes before breakfast. It is important you take this on an empty stomach to keep from having diarrhea. I have provided a voucher to get a month supply free.   Please have blood work done today. We have also scheduled you for an ultrasound of your liver.   Finally, we have set you up for a colonoscopy and upper endoscopy with Dr. Gala Romney. WE WILL CALL YOU ABOUT THE BLOOD THINNER AND HOW LONG TO HOLD THIS.

## 2013-10-11 NOTE — Assessment & Plan Note (Signed)
First ever high risk screening colonoscopy now.

## 2013-10-11 NOTE — Telephone Encounter (Signed)
Patient may hold Eliquis for 2-3 days prior to colonoscopy.  Restart as soon as OK with GI    I have not seen her since her hospitalization and she has been followed in our [place] office.  She should probably establish care with Dr. Harl Bowie

## 2013-10-12 NOTE — Telephone Encounter (Signed)
Routing to Anna Sams, NP. 

## 2013-10-15 NOTE — Progress Notes (Signed)
cc'd to pcp 

## 2013-10-17 ENCOUNTER — Ambulatory Visit (HOSPITAL_COMMUNITY)
Admission: RE | Admit: 2013-10-17 | Discharge: 2013-10-17 | Disposition: A | Discharge status: Home / Self Care | Payer: PRIVATE HEALTH INSURANCE | Source: Ambulatory Visit | Attending: Gastroenterology | Admitting: Gastroenterology

## 2013-10-17 DIAGNOSIS — N1330 Unspecified hydronephrosis: Secondary | ICD-10-CM | POA: Insufficient documentation

## 2013-10-17 DIAGNOSIS — R945 Abnormal results of liver function studies: Secondary | ICD-10-CM

## 2013-10-17 DIAGNOSIS — J90 Pleural effusion, not elsewhere classified: Secondary | ICD-10-CM | POA: Diagnosis not present

## 2013-10-17 DIAGNOSIS — R7989 Other specified abnormal findings of blood chemistry: Secondary | ICD-10-CM | POA: Diagnosis present

## 2013-10-17 NOTE — Telephone Encounter (Signed)
Hold for 3 days prior

## 2013-10-17 NOTE — Progress Notes (Signed)
Quick Note:  Chronic hydronephrosis, question chronic UPJ obstruction on [place] of abdomen. Liver normal. Does she have a urologist? If not, I would recommend referral to one.  No further work-up of LFTs needed. Likely med effect caused bump in LFTs, and they resolved after cessation of statin. ______

## 2013-10-17 NOTE — Telephone Encounter (Signed)
pts sister- [name] is aware to hold medication 3 days prior to procedure.

## 2013-10-17 NOTE — Telephone Encounter (Signed)
I called and told pt, but she doesn't understand. I will call her sister who is on her emergency contact.

## 2013-10-17 NOTE — Progress Notes (Signed)
Quick Note:  Hgb continues to drop, now 2 grams down in a month. Iron and ferritin normal.  LFTs normal.  She is on Eliquis. It is very important that if she notices ANY black, tarry stool or bright red blood per rectum prior to her procedure that she present to the ED. If she has abdominal pain, fatigue, or any other symptoms, call us immediately. As she has no overt signs of GI bleeding, will continue to monitor for now. However, she would need to seek medical attention if any signs of active bleeding. ______

## 2013-10-17 NOTE — Telephone Encounter (Signed)
I called, but the sister has the same phone number. Pt said she has not got home from work.  I will have Almyra Free try to call her this PM since she will be working late.

## 2013-10-24 ENCOUNTER — Ambulatory Visit (HOSPITAL_COMMUNITY)
Admission: RE | Admit: 2013-10-24 | Discharge: 2013-10-24 | Disposition: A | Discharge status: Home / Self Care | Payer: PRIVATE HEALTH INSURANCE | Source: Ambulatory Visit | Attending: Internal Medicine | Admitting: Internal Medicine

## 2013-10-24 ENCOUNTER — Encounter (HOSPITAL_COMMUNITY)
Admission: RE | Disposition: A | Discharge status: Home / Self Care | Payer: Self-pay | Source: Ambulatory Visit | Attending: Internal Medicine

## 2013-10-24 ENCOUNTER — Encounter (HOSPITAL_COMMUNITY): Payer: Self-pay | Admitting: *Deleted

## 2013-10-24 DIAGNOSIS — M129 Arthropathy, unspecified: Secondary | ICD-10-CM | POA: Diagnosis not present

## 2013-10-24 DIAGNOSIS — Z7982 Long term (current) use of aspirin: Secondary | ICD-10-CM | POA: Insufficient documentation

## 2013-10-24 DIAGNOSIS — Z87891 Personal history of nicotine dependence: Secondary | ICD-10-CM | POA: Diagnosis not present

## 2013-10-24 DIAGNOSIS — I4891 Unspecified atrial fibrillation: Secondary | ICD-10-CM | POA: Diagnosis not present

## 2013-10-24 DIAGNOSIS — K921 Melena: Secondary | ICD-10-CM | POA: Diagnosis present

## 2013-10-24 DIAGNOSIS — D126 Benign neoplasm of colon, unspecified: Secondary | ICD-10-CM | POA: Diagnosis not present

## 2013-10-24 DIAGNOSIS — I2510 Atherosclerotic heart disease of native coronary artery without angina pectoris: Secondary | ICD-10-CM | POA: Insufficient documentation

## 2013-10-24 DIAGNOSIS — Z800 Family history of malignant neoplasm of digestive organs: Secondary | ICD-10-CM

## 2013-10-24 DIAGNOSIS — I10 Essential (primary) hypertension: Secondary | ICD-10-CM | POA: Diagnosis not present

## 2013-10-24 DIAGNOSIS — K319 Disease of stomach and duodenum, unspecified: Secondary | ICD-10-CM | POA: Insufficient documentation

## 2013-10-24 DIAGNOSIS — Z79899 Other long term (current) drug therapy: Secondary | ICD-10-CM | POA: Diagnosis not present

## 2013-10-24 DIAGNOSIS — K5730 Diverticulosis of large intestine without perforation or abscess without bleeding: Secondary | ICD-10-CM | POA: Diagnosis not present

## 2013-10-24 DIAGNOSIS — F70 Mild intellectual disabilities: Secondary | ICD-10-CM | POA: Diagnosis not present

## 2013-10-24 DIAGNOSIS — J449 Chronic obstructive pulmonary disease, unspecified: Secondary | ICD-10-CM | POA: Diagnosis not present

## 2013-10-24 DIAGNOSIS — I2589 Other forms of chronic ischemic heart disease: Secondary | ICD-10-CM | POA: Insufficient documentation

## 2013-10-24 DIAGNOSIS — I252 Old myocardial infarction: Secondary | ICD-10-CM | POA: Insufficient documentation

## 2013-10-24 DIAGNOSIS — K449 Diaphragmatic hernia without obstruction or gangrene: Secondary | ICD-10-CM | POA: Insufficient documentation

## 2013-10-24 DIAGNOSIS — K2270 Barrett's esophagus without dysplasia: Secondary | ICD-10-CM | POA: Diagnosis not present

## 2013-10-24 DIAGNOSIS — J4489 Other specified chronic obstructive pulmonary disease: Secondary | ICD-10-CM | POA: Insufficient documentation

## 2013-10-24 DIAGNOSIS — K5900 Constipation, unspecified: Secondary | ICD-10-CM

## 2013-10-24 DIAGNOSIS — D649 Anemia, unspecified: Secondary | ICD-10-CM

## 2013-10-24 DIAGNOSIS — R195 Other fecal abnormalities: Secondary | ICD-10-CM

## 2013-10-24 HISTORY — PX: COLONOSCOPY: SHX5424

## 2013-10-24 HISTORY — PX: ESOPHAGOGASTRODUODENOSCOPY: SHX5428

## 2013-10-24 HISTORY — DX: Atrophy of kidney (terminal): N26.1

## 2013-10-24 HISTORY — DX: Gastro-esophageal reflux disease without esophagitis: K21.9

## 2013-10-24 SURGERY — COLONOSCOPY
Anesthesia: Moderate Sedation

## 2013-10-24 MED ORDER — ONDANSETRON HCL 4 MG/2ML IJ SOLN
INTRAMUSCULAR | Status: DC | PRN
  Administered 2013-10-24: 4 mg via INTRAVENOUS

## 2013-10-24 MED ORDER — MEPERIDINE HCL 100 MG/ML IJ SOLN
INTRAMUSCULAR | Status: AC
  Filled 2013-10-24: qty 2

## 2013-10-24 MED ORDER — SODIUM CHLORIDE 0.9 % IJ SOLN
INTRAMUSCULAR | Status: DC | PRN
  Administered 2013-10-24: 20 mL

## 2013-10-24 MED ORDER — SODIUM CHLORIDE 0.9 % IV SOLN
INTRAVENOUS | Status: DC
  Administered 2013-10-24: 1000 mL via INTRAVENOUS

## 2013-10-24 MED ORDER — MIDAZOLAM HCL 5 MG/5ML IJ SOLN
INTRAMUSCULAR | Status: DC | PRN
  Administered 2013-10-24: 2 mg via INTRAVENOUS
  Administered 2013-10-24 (×4): 0.5 mg via INTRAVENOUS

## 2013-10-24 MED ORDER — MEPERIDINE HCL 100 MG/ML IJ SOLN
INTRAMUSCULAR | Status: DC | PRN
  Administered 2013-10-24: 25 mg via INTRAVENOUS

## 2013-10-24 MED ORDER — STERILE WATER FOR IRRIGATION IR SOLN
Status: DC | PRN
  Administered 2013-10-24: 11:00:00

## 2013-10-24 MED ORDER — MIDAZOLAM HCL 5 MG/5ML IJ SOLN
INTRAMUSCULAR | Status: AC
  Filled 2013-10-24: qty 10

## 2013-10-24 MED ORDER — LIDOCAINE VISCOUS 2 % MT SOLN
OROMUCOSAL | Status: AC
  Filled 2013-10-24: qty 15

## 2013-10-24 MED ORDER — ONDANSETRON HCL 4 MG/2ML IJ SOLN
INTRAMUSCULAR | Status: AC
  Filled 2013-10-24: qty 2

## 2013-10-24 MED ORDER — LIDOCAINE VISCOUS 2 % MT SOLN
OROMUCOSAL | Status: DC | PRN
  Administered 2013-10-24: 3 mL via OROMUCOSAL

## 2013-10-24 NOTE — Op Note (Signed)
[facility] [address] [place], [address]   ENDOSCOPY PROCEDURE REPORT  PATIENT: Alexis Dillon, Alexis Dillon  MR#: [id number] BIRTHDATE: 09-05-1945 , 67  yrs. old GENDER: Female ENDOSCOPIST: R.  Garfield Cornea, MD FACP [facility] REFERRED BY:  Sinda Du, M.D. PROCEDURE DATE:  10/24/2013 PROCEDURE:     EGD with esophageal biopsy  INDICATIONS:     Melena; Hemoccult positive stool  INFORMED CONSENT:   The risks, benefits, limitations, alternatives and imponderables have been discussed.  The potential for biopsy, esophogeal dilation, etc. have also been reviewed.  Questions have been answered.  All parties agreeable.  Please see the history and physical in the medical record for more information.  MEDICATIONS:      Versed 2.5 mg IV and Demerol 25 mg IV in divided doses. Zofran 4 mg IV. Xylocaine gel orally  DESCRIPTION OF PROCEDURE:   The EG-2990i ([id number])  endoscope was introduced through the mouth and advanced to the second portion of the duodenum without difficulty or limitations.  The mucosal surfaces were surveyed very carefully during advancement of the scope and upon withdrawal.  Retroflexion view of the proximal stomach and esophagogastric junction was performed.      FINDINGS: Abnormal-appearing, salmon-colored, esophageal mucosa extending from the GE junction (35 cm from the incisors) to 23 cm from the incisors. No nodularity. No esophagitis tubular esophagus patent throughout its course. Stomach empty. Patient had a 3 cm hiatal hernia; otherwise, the remainder of the gastric mucosa appeared normal. Patent pylorus. Normal first and second portion of the duodenum  THERAPEUTIC / DIAGNOSTIC MANEUVERS PERFORMED:  Segmental biopsies of the esophagus beginning at 35 cm, circumferentially, taken every 2 cm to the proximal extent-23 cm from the incisors.   COMPLICATIONS:  None  IMPRESSION:  Abnormal esophagus consistent with Barrett's esophagus-status post  biopsy. Hiatal hernia.  RECOMMENDATIONS:  Followup on pathology. See colonoscopy report.    _______________________________ R. Garfield Cornea, MD FACP [facility] eSigned:  R. Garfield Cornea, MD FACP [facility] 10/24/2013 11:31 AM     CC:  PATIENT NAME:  Alexis Dillon, Alexis Dillon MR#: [id number]

## 2013-10-24 NOTE — Op Note (Signed)
[facility] [address] [place], [address]   COLONOSCOPY PROCEDURE REPORT  PATIENT: Alexis Dillon, Alexis Dillon  MR#:         [id number] BIRTHDATE: [birth year] , 67  yrs. old GENDER: Female ENDOSCOPIST: R.  Garfield Cornea, MD FACP [facility] REFERRED BY:  Sinda Du, M.D. PROCEDURE DATE:  10/24/2013 PROCEDURE:     Ileocolonoscopy with multiple snare polypectomies (saline-assisted)  INDICATIONS: Hemoccult-positive stool  INFORMED CONSENT:  The risks, benefits, alternatives and imponderables including but not limited to bleeding, perforation as well as the possibility of a missed lesion have been reviewed.  The potential for biopsy, lesion removal, etc. have also been discussed.  Questions have been answered.  All parties agreeable. Please see the history and physical in the medical record for more information.  MEDICATIONS: Versed 4 mg IV and Demerol 25 mg IV in divided doses. Zofran 4 mg IV.  DESCRIPTION OF PROCEDURE:  After a digital rectal exam was performed, the EG-2990i ([id number]) and EC-3890Li ([id number]) colonoscope was advanced from the anus through the rectum and colon to the area of the cecum, ileocecal valve and appendiceal orifice. The cecum was deeply intubated.  These structures were well-seen and photographed for the record.  From the level of the cecum and ileocecal valve, the scope was slowly and cautiously withdrawn. The mucosal surfaces were carefully surveyed utilizing scope tip deflection to facilitate fold flattening as needed.  The scope was pulled down into the rectum where a thorough examination including retroflexion was performed.    FINDINGS:  Adequate preparation. Normal rectum. Densely populated left-sided diverticula; (1) 1 x 2.5 cm sessile polyp spanning a fold at the splenic flexure; there were also (2) 5 mm polyps in the same area; otherwise, the remainder of the colonic mucosa appeared normal. The distal 10 cm of terminal ileal mucosa also  appeared normal.  THERAPEUTIC / DIAGNOSTIC MANEUVERS PERFORMED:  The largest polyp at the splenic flexure was lifted away from the colonic wall with approximally 8 cc of normal saline injected submucosally. This lesion was then piecemeal removed with hot snare cautery loop technique. A small amount or residual polyp tissue at the peripheral of the polypectomy site was ablated with the tip of a hot snare loop. This lesion was felt to have been entirely resected/ablated.  The adjacent smaller polyps were hot snare removed.  COMPLICATIONS: none  CECAL WITHDRAWAL TIME:  34 minutes  IMPRESSION:  Colonic diverticulosis. Multiple colonic polyps-removed/treated as described above.  RECOMMENDATIONS: Followup on pathology. See EGD report. Patient is not to resume her Eliquis until June 16.   _______________________________ eSigned:  R. Garfield Cornea, MD FACP [facility] 10/24/2013 12:31 PM   CC:    PATIENT NAME:  [name], [name] MR#: [id number]

## 2013-10-24 NOTE — Discharge Instructions (Signed)
Colonoscopy Discharge Instructions  Read the instructions outlined below and refer to this sheet in the next few weeks. These discharge instructions provide you with general information on caring for yourself after you leave the hospital. Your doctor may also give you specific instructions. While your treatment has been planned according to the most current medical practices available, unavoidable complications occasionally occur. If you have any problems or questions after discharge, call Dr. Gala Romney at [phone]. ACTIVITY  You may resume your regular activity, but move at a slower pace for the next 24 hours.   Take frequent rest periods for the next 24 hours.   Walking will help get rid of the air and reduce the bloated feeling in your belly (abdomen).   No driving for 24 hours (because of the medicine (anesthesia) used during the test).    Do not sign any important legal documents or operate any machinery for 24 hours (because of the anesthesia used during the test).  NUTRITION  Drink plenty of fluids.   You may resume your normal diet as instructed by your doctor.   Begin with a light meal and progress to your normal diet. Heavy or fried foods are harder to digest and may make you feel sick to your stomach (nauseated).   Avoid alcoholic beverages for 24 hours or as instructed.  MEDICATIONS  You may resume your normal medications unless your doctor tells you otherwise.  WHAT YOU CAN EXPECT TODAY  Some feelings of bloating in the abdomen.   Passage of more gas than usual.   Spotting of blood in your stool or on the toilet paper.  IF YOU HAD POLYPS REMOVED DURING THE COLONOSCOPY:  No aspirin products for 7 days or as instructed.   No alcohol for 7 days or as instructed.   Eat a soft diet for the next 24 hours.  FINDING OUT THE RESULTS OF YOUR TEST Not all test results are available during your visit. If your test results are not back during the visit, make an appointment  with your caregiver to find out the results. Do not assume everything is normal if you have not heard from your caregiver or the medical facility. It is important for you to follow up on all of your test results.  SEEK IMMEDIATE MEDICAL ATTENTION IF:  You have more than a spotting of blood in your stool.   Your belly is swollen (abdominal distention).   You are nauseated or vomiting.   You have a temperature over 101.  You have abdominal pain or discomfort that is severe or gets worse throughout the day. EGD Discharge instructions Please read the instructions outlined below and refer to this sheet in the next few weeks. These discharge instructions provide you with general information on caring for yourself after you leave the hospital. Your doctor may also give you specific instructions. While your treatment has been planned according to the most current medical practices available, unavoidable complications occasionally occur. If you have any problems or questions after discharge, please call your doctor. ACTIVITY You may resume your regular activity but move at a slower pace for the next 24 hours.  Take frequent rest periods for the next 24 hours.  Walking will help expel (get rid of) the air and reduce the bloated feeling in your abdomen.  No driving for 24 hours (because of the anesthesia (medicine) used during the test).  You may shower.  Do not sign any important legal documents or operate any machinery for 24  hours (because of the anesthesia used during the test).  NUTRITION Drink plenty of fluids.  You may resume your normal diet.  Begin with a light meal and progress to your normal diet.  Avoid alcoholic beverages for 24 hours or as instructed by your caregiver.  MEDICATIONS You may resume your normal medications unless your caregiver tells you otherwise.  WHAT YOU CAN EXPECT TODAY You may experience abdominal discomfort such as a feeling of fullness or gas pains.   FOLLOW-UP Your doctor will discuss the results of your test with you.  SEEK IMMEDIATE MEDICAL ATTENTION IF ANY OF THE FOLLOWING OCCUR: Excessive nausea (feeling sick to your stomach) and/or vomiting.  Severe abdominal pain and distention (swelling).  Trouble swallowing.  Temperature over 101 F (37.8 C).  Rectal bleeding or vomiting of blood.    Polyp and diverticulosis information provided  Further recommendations to follow pending review of pathology report  Resume Eliquis  on June 16

## 2013-10-24 NOTE — Interval H&P Note (Signed)
History and Physical Interval Note:  10/24/2013 11:01 AM  Alexis Dillon  has presented today for surgery, with the diagnosis of CONSTIPATION, ANEMIA, HEME POSITIVE STOOLS, FAMILY HISTORY OF COLON CANCER  The various methods of treatment have been discussed with the patient and family. After consideration of risks, benefits and other options for treatment, the patient has consented to  Procedure(s) with comments: COLONOSCOPY (N/A) - 10:30 ESOPHAGOGASTRODUODENOSCOPY (EGD) (N/A) as a surgical intervention .  The patient's history has been reviewed, patient examined, no change in status, stable for surgery.  I have reviewed the patient's chart and labs.  Questions were answered to the patient's satisfaction.     No change. Anticoagulation held x3 days. EGD and colonoscopy per plan.  The risks, benefits, limitations, imponderables and alternatives regarding both EGD and colonoscopy have been reviewed with the patient. Questions have been answered. All parties agreeable.    Manus Rudd

## 2013-10-24 NOTE — H&P (View-Only) (Signed)
Primary Care Physician:  Alonza Bogus, MD Primary Gastroenterologist:  Dr. Gala Romney  Chief Complaint  Patient presents with  . Heme+ stool    HPI:   Alexis Dillon presents today at the request of Dr. Luan Pulling secondary to heme positive stool. She has a significant cardiac history to include CAD, afib, ischemic cardiomyopathy with EF of  20-25%, and she takes Eliquis for afib. Appears recent Hgb was 10, which is down from 12 in Jan 2015. She has also had a significant bump in her LFTs, with alk phos 778, AST 87, and ALT 92 in April 2015. This was in the presence of a statin, which was then stopped. Updated LFTs not available. Appears she has had another occasion of elevated LFTs in Dec 2014. However, during that time, she was inpatient for acute NSTEMI, with elevated LFTs more of a bystander reaction, and they quickly returned to baseline in several days.    She had a large ball of stool this morning without blood. Intermittent constipation. States her stool was pitch black awhile ago, but she had been on iron. Denies hematochezia. Miralax for constipation in past but has stopped. States she was given alka seltzer tablets while in the nursing home during bouts with indigestion. Protonix daily. Sometimes sneaks food and then throws it up. Niece present stating they are trying to provide a healthy diet for her, and patient will sneak food. Lives with her sister.  Occasional hoarseness, clearing throat. Stopped Ibuprofen; niece states used to take regularly multiple times a day. Good appetite.   Sometimes lower abdominal discomfort associated with constipation. Very difficult to keep focused on history, poor historian. Mild mental retardation. +FH of colon cancer in brother, deceased in his early 65s. No prior colonoscopy or EGD. Denies any known personal or family history of liver disease.   Past Medical History  Diagnosis Date  . COPD (chronic obstructive pulmonary disease)   . Tobacco abuse    . Hypertension   . History of atrial septal defect     Closure in 50's   . Mental retardation, idiopathic mild   . Arthritis   . NSTEMI (non-ST elevated myocardial infarction)   . A-fib     Past Surgical History  Procedure Laterality Date  . Asd repair      in her 65s  . Total hip arthroplasty Right     in her 86s  . Total abdominal hysterectomy      in the 39s  . Tee without cardioversion N/A 05/08/2013    Procedure: TRANSESOPHAGEAL ECHOCARDIOGRAM (TEE);  Surgeon: Lelon Perla, MD;  Location: [facility] ENDOSCOPY;  Service: Cardiovascular;  Laterality: N/A;  . Cardioversion N/A 05/08/2013    Procedure: CARDIOVERSION;  Surgeon: Lelon Perla, MD;  Location: [facility] ENDOSCOPY;  Service: Cardiovascular;  Laterality: N/A;  Dr. Zoila Shutter verified with EP pt in a flutter...will cardiovert...  150 joules @ 4:40, using Propofol 40  mg/IV .Marland Kitchen.successful NSR   . Tonsillectomy      Current Outpatient Prescriptions  Medication Sig Dispense Refill  . acetaminophen (TYLENOL) 325 MG tablet Take 2 tablets (650 mg total) by mouth every 6 (six) hours as needed for mild pain or fever.      Marland Kitchen albuterol (PROVENTIL) (2.5 MG/3ML) 0.083% nebulizer solution Take 2.5 mg by nebulization every 6 (six) hours as needed for wheezing or shortness of breath.      . ALPRAZolam (XANAX) 0.25 MG tablet       . amiodarone (PACERONE) 200  MG tablet Continue 200 mg twice a day until 05/31/13. Then decrease to 200 mg daily.  60 tablet  1  . apixaban (ELIQUIS) 2.5 MG TABS tablet Take 1 tablet (2.5 mg total) by mouth 2 (two) times daily.  60 tablet  0  . aspirin EC 81 MG EC tablet Take 1 tablet (81 mg total) by mouth daily.      Marland Kitchen HYDROcodone-acetaminophen (NORCO/VICODIN) 5-325 MG per tablet       . ipratropium (ATROVENT HFA) 17 MCG/ACT inhaler Inhale 1 puff into the lungs every 6 (six) hours as needed for wheezing.      . pantoprazole (PROTONIX) 40 MG tablet Take 40 mg by mouth daily.      . predniSONE (DELTASONE) 10 MG tablet  Take 1 tablet (10 mg total) by mouth daily with breakfast.  3 tablet  0   No current facility-administered medications for this visit.    Allergies as of 10/11/2013  . (No Known Allergies)    Family History  Problem Relation Age of Onset  . Heart attack Brother   . Heart attack Brother   . Diabetes Mother   . Heart attack Father     Died at 58 from MI  . Heart attack Sister     stent at 38  . Heart attack Sister     stent at 40  . Cancer Brother     lung  . Cancer Brother     lung   . Colon cancer Brother     deceased, diagnosed in early 4s    History   Social History  . Marital Status: Divorced    Spouse Name: N/A    Number of Children: N/A  . Years of Education: N/A   Occupational History  . Not on file.   Social History Main Topics  . Smoking status: Former Smoker -- 1.00 packs/day for 55 years    Types: Cigarettes  . Smokeless tobacco: Never Used     Comment: stopped Dec 2014  . Alcohol Use: No  . Drug Use: No  . Sexual Activity: Not on file   Other Topics Concern  . Not on file   Social History Narrative   She has mild mental retardation. Patient not married and lives with her sister. Divorced with no children.     Review of Systems: Gen: see HPI CV: occasional palpitations Resp: +cough, +DOE GI: see HPI GU : Denies urinary burning, urinary frequency, urinary hesitancy MS: occasional joint pain Derm: Denies rash, itching, dry skin Psych:+ anxiety  Heme: Denies bruising, bleeding, and enlarged lymph nodes.  Physical Exam: BP 145/71  Pulse 80  Temp(Src) 98.4 F (36.9 C) (Oral)  Ht _0  (1.575 m)  Wt 110 lb 12.8 oz (50.259 kg)  BMI 20.26 kg/m2 General:   Alert and oriented. Pleasant and cooperative. Well-nourished and well-developed. Difficult to keep focused but quite pleasant and talkative.  Head:  Normocephalic and atraumatic. Eyes:  Without icterus, sclera clear and conjunctiva pink.  Ears:  Normal auditory acuity. Nose:  No  deformity, discharge,  or lesions. Mouth:  No deformity or lesions, oral mucosa pink.  Neck:  Supple, without mass or thyromegaly. Lungs:  Wheezing posteriorly more pronounced than anterior Heart:  S1, S2 present without murmurs appreciated.  Abdomen:  +BS, soft, non-tender and non-distended. No HSM noted. No guarding or rebound. No masses appreciated.  Rectal:  Deferred  Msk:  Symmetrical without gross deformities. Normal posture. Extremities:  Without clubbing or edema. Neurologic:  Alert and  oriented x4;  grossly normal neurologically. Skin:  Intact without significant lesions or rashes. Psych:  Alert and cooperative. Normal mood and affect.   Component     Latest Ref Rng 09/04/2013          WBC     4.0 - 10.5 K/uL 11.3 (H)  RBC     3.87 - 5.11 MIL/uL 3.84 (L)  Hemoglobin     12.0 - 15.0 g/dL 10.0 (L)  HCT     36.0 - 46.0 % 31.6 (L)  MCV     78.0 - 100.0 fL 82.3  MCH     26.0 - 34.0 pg 26.0  MCHC     30.0 - 36.0 g/dL 31.6  RDW     11.5 - 15.5 % 18.0 (H)  Platelets     150 - 400 K/uL 381   Component     Latest Ref Rng 05/02/2013 05/06/2013 09/04/2013  Total Protein     6.0 - 8.3 g/dL 7.1 6.4 6.7  Albumin     3.5 - 5.2 g/dL 4.0 3.5 3.7  AST     0 - 37 U/L 255 (H) 35 87 (H)  ALT     0 - 35 U/L 175 (H) 48 (H) 92 (H)  Alkaline Phosphatase     39 - 117 U/L 192 (H) 102 778 (H)  Total Bilirubin     0.3 - 1.2 mg/dL 0.3 0.2 (L) 0.8  Bilirubin, Direct     0.0 - 0.3 mg/dL  0.1 0.3  Indirect Bilirubin     0.2 - 1.2 mg/dL  0.1 (L) 0.5

## 2013-10-25 ENCOUNTER — Encounter (HOSPITAL_COMMUNITY): Payer: Self-pay | Admitting: Internal Medicine

## 2013-10-27 ENCOUNTER — Encounter: Payer: Self-pay | Admitting: Internal Medicine

## 2013-11-06 ENCOUNTER — Telehealth: Payer: Self-pay | Admitting: *Deleted

## 2013-11-06 NOTE — Telephone Encounter (Signed)
Pt's daughter called wanting to get the results. Please advise [phone] ask for wendy

## 2014-03-27 ENCOUNTER — Encounter: Payer: Self-pay | Admitting: Internal Medicine

## 2014-04-04 ENCOUNTER — Inpatient Hospital Stay (HOSPITAL_COMMUNITY)
Admission: EM | Admit: 2014-04-04 | Discharge: 2014-04-10 | DRG: 377 | Disposition: A | Discharge status: Skilled Nursing Facility | Payer: PRIVATE HEALTH INSURANCE | Attending: Pulmonary Disease | Admitting: Pulmonary Disease

## 2014-04-04 ENCOUNTER — Emergency Department (HOSPITAL_COMMUNITY): Payer: PRIVATE HEALTH INSURANCE

## 2014-04-04 ENCOUNTER — Encounter (HOSPITAL_COMMUNITY): Payer: Self-pay

## 2014-04-04 DIAGNOSIS — M1990 Unspecified osteoarthritis, unspecified site: Secondary | ICD-10-CM | POA: Diagnosis present

## 2014-04-04 DIAGNOSIS — Z9981 Dependence on supplemental oxygen: Secondary | ICD-10-CM | POA: Diagnosis not present

## 2014-04-04 DIAGNOSIS — N184 Chronic kidney disease, stage 4 (severe): Secondary | ICD-10-CM | POA: Diagnosis present

## 2014-04-04 DIAGNOSIS — K5730 Diverticulosis of large intestine without perforation or abscess without bleeding: Secondary | ICD-10-CM | POA: Diagnosis present

## 2014-04-04 DIAGNOSIS — I4892 Unspecified atrial flutter: Secondary | ICD-10-CM | POA: Diagnosis present

## 2014-04-04 DIAGNOSIS — Z96641 Presence of right artificial hip joint: Secondary | ICD-10-CM | POA: Diagnosis present

## 2014-04-04 DIAGNOSIS — I255 Ischemic cardiomyopathy: Secondary | ICD-10-CM | POA: Diagnosis present

## 2014-04-04 DIAGNOSIS — I5023 Acute on chronic systolic (congestive) heart failure: Secondary | ICD-10-CM | POA: Diagnosis present

## 2014-04-04 DIAGNOSIS — I519 Heart disease, unspecified: Secondary | ICD-10-CM | POA: Diagnosis present

## 2014-04-04 DIAGNOSIS — I509 Heart failure, unspecified: Secondary | ICD-10-CM

## 2014-04-04 DIAGNOSIS — D62 Acute posthemorrhagic anemia: Secondary | ICD-10-CM | POA: Diagnosis present

## 2014-04-04 DIAGNOSIS — M545 Low back pain, unspecified: Secondary | ICD-10-CM | POA: Diagnosis present

## 2014-04-04 DIAGNOSIS — I739 Peripheral vascular disease, unspecified: Secondary | ICD-10-CM | POA: Diagnosis present

## 2014-04-04 DIAGNOSIS — I252 Old myocardial infarction: Secondary | ICD-10-CM

## 2014-04-04 DIAGNOSIS — R0602 Shortness of breath: Secondary | ICD-10-CM | POA: Diagnosis present

## 2014-04-04 DIAGNOSIS — Z7901 Long term (current) use of anticoagulants: Secondary | ICD-10-CM | POA: Diagnosis not present

## 2014-04-04 DIAGNOSIS — K922 Gastrointestinal hemorrhage, unspecified: Secondary | ICD-10-CM | POA: Diagnosis present

## 2014-04-04 DIAGNOSIS — F70 Mild intellectual disabilities: Secondary | ICD-10-CM | POA: Diagnosis present

## 2014-04-04 DIAGNOSIS — Z86010 Personal history of colonic polyps: Secondary | ICD-10-CM | POA: Diagnosis not present

## 2014-04-04 DIAGNOSIS — J449 Chronic obstructive pulmonary disease, unspecified: Secondary | ICD-10-CM | POA: Diagnosis present

## 2014-04-04 DIAGNOSIS — R05 Cough: Secondary | ICD-10-CM

## 2014-04-04 DIAGNOSIS — D509 Iron deficiency anemia, unspecified: Secondary | ICD-10-CM | POA: Diagnosis present

## 2014-04-04 DIAGNOSIS — K219 Gastro-esophageal reflux disease without esophagitis: Secondary | ICD-10-CM | POA: Diagnosis present

## 2014-04-04 DIAGNOSIS — I4891 Unspecified atrial fibrillation: Secondary | ICD-10-CM | POA: Diagnosis present

## 2014-04-04 DIAGNOSIS — R195 Other fecal abnormalities: Secondary | ICD-10-CM

## 2014-04-04 DIAGNOSIS — K2270 Barrett's esophagus without dysplasia: Secondary | ICD-10-CM | POA: Diagnosis present

## 2014-04-04 DIAGNOSIS — J9601 Acute respiratory failure with hypoxia: Secondary | ICD-10-CM

## 2014-04-04 DIAGNOSIS — I2510 Atherosclerotic heart disease of native coronary artery without angina pectoris: Secondary | ICD-10-CM | POA: Diagnosis present

## 2014-04-04 DIAGNOSIS — I129 Hypertensive chronic kidney disease with stage 1 through stage 4 chronic kidney disease, or unspecified chronic kidney disease: Secondary | ICD-10-CM | POA: Diagnosis present

## 2014-04-04 DIAGNOSIS — I10 Essential (primary) hypertension: Secondary | ICD-10-CM | POA: Diagnosis present

## 2014-04-04 DIAGNOSIS — R059 Cough, unspecified: Secondary | ICD-10-CM

## 2014-04-04 DIAGNOSIS — I5021 Acute systolic (congestive) heart failure: Secondary | ICD-10-CM | POA: Diagnosis present

## 2014-04-04 DIAGNOSIS — D649 Anemia, unspecified: Secondary | ICD-10-CM | POA: Diagnosis present

## 2014-04-04 DIAGNOSIS — Z7982 Long term (current) use of aspirin: Secondary | ICD-10-CM

## 2014-04-04 DIAGNOSIS — J438 Other emphysema: Secondary | ICD-10-CM | POA: Diagnosis present

## 2014-04-04 DIAGNOSIS — I4510 Unspecified right bundle-branch block: Secondary | ICD-10-CM | POA: Diagnosis present

## 2014-04-04 DIAGNOSIS — Z720 Tobacco use: Secondary | ICD-10-CM | POA: Diagnosis present

## 2014-04-04 DIAGNOSIS — E785 Hyperlipidemia, unspecified: Secondary | ICD-10-CM | POA: Diagnosis present

## 2014-04-04 DIAGNOSIS — J9600 Acute respiratory failure, unspecified whether with hypoxia or hypercapnia: Secondary | ICD-10-CM | POA: Diagnosis present

## 2014-04-04 DIAGNOSIS — J441 Chronic obstructive pulmonary disease with (acute) exacerbation: Secondary | ICD-10-CM | POA: Diagnosis present

## 2014-04-04 DIAGNOSIS — N289 Disorder of kidney and ureter, unspecified: Secondary | ICD-10-CM

## 2014-04-04 DIAGNOSIS — N183 Chronic kidney disease, stage 3 unspecified: Secondary | ICD-10-CM | POA: Diagnosis present

## 2014-04-04 DIAGNOSIS — Z87891 Personal history of nicotine dependence: Secondary | ICD-10-CM

## 2014-04-04 HISTORY — DX: Diaphragmatic hernia without obstruction or gangrene: K44.9

## 2014-04-04 HISTORY — DX: Atherosclerotic heart disease of native coronary artery without angina pectoris: I25.10

## 2014-04-04 HISTORY — DX: Diverticulosis of intestine, part unspecified, without perforation or abscess without bleeding: K57.90

## 2014-04-04 HISTORY — DX: Ischemic cardiomyopathy: I25.5

## 2014-04-04 HISTORY — DX: Barrett's esophagus without dysplasia: K22.70

## 2014-04-04 LAB — CBC WITH DIFFERENTIAL/PLATELET
BASOS ABS: 0 10*3/uL (ref 0.0–0.1)
Basophils Relative: 0 % (ref 0–1)
Eosinophils Absolute: 0 10*3/uL (ref 0.0–0.7)
Eosinophils Relative: 0 % (ref 0–5)
HEMATOCRIT: 22.7 % — AB (ref 36.0–46.0)
Hemoglobin: 6.2 g/dL — CL (ref 12.0–15.0)
LYMPHS ABS: 0.7 10*3/uL (ref 0.7–4.0)
LYMPHS PCT: 4 % — AB (ref 12–46)
MCH: 21.3 pg — ABNORMAL LOW (ref 26.0–34.0)
MCHC: 27.3 g/dL — ABNORMAL LOW (ref 30.0–36.0)
MCV: 78 fL (ref 78.0–100.0)
MONO ABS: 1.3 10*3/uL — AB (ref 0.1–1.0)
Monocytes Relative: 8 % (ref 3–12)
Neutro Abs: 14.4 10*3/uL — ABNORMAL HIGH (ref 1.7–7.7)
Neutrophils Relative %: 87 % — ABNORMAL HIGH (ref 43–77)
Platelets: 392 10*3/uL (ref 150–400)
RBC: 2.91 MIL/uL — AB (ref 3.87–5.11)
RDW: 19.1 % — AB (ref 11.5–15.5)
WBC: 16.5 10*3/uL — AB (ref 4.0–10.5)

## 2014-04-04 LAB — RETICULOCYTES
RBC.: 3.48 MIL/uL — ABNORMAL LOW (ref 3.87–5.11)
RETIC CT PCT: 1.8 % (ref 0.4–3.1)
Retic Count, Absolute: 62.6 10*3/uL (ref 19.0–186.0)

## 2014-04-04 LAB — CBC
HCT: 27.1 % — ABNORMAL LOW (ref 36.0–46.0)
Hemoglobin: 8 g/dL — ABNORMAL LOW (ref 12.0–15.0)
MCH: 23 pg — ABNORMAL LOW (ref 26.0–34.0)
MCHC: 29.5 g/dL — ABNORMAL LOW (ref 30.0–36.0)
MCV: 77.9 fL — AB (ref 78.0–100.0)
PLATELETS: 395 10*3/uL (ref 150–400)
RBC: 3.48 MIL/uL — ABNORMAL LOW (ref 3.87–5.11)
RDW: 18.1 % — ABNORMAL HIGH (ref 11.5–15.5)
WBC: 15.1 10*3/uL — AB (ref 4.0–10.5)

## 2014-04-04 LAB — BASIC METABOLIC PANEL
ANION GAP: 13 (ref 5–15)
BUN: 25 mg/dL — ABNORMAL HIGH (ref 6–23)
CO2: 23 mEq/L (ref 19–32)
Calcium: 8.9 mg/dL (ref 8.4–10.5)
Chloride: 103 mEq/L (ref 96–112)
Creatinine, Ser: 2.16 mg/dL — ABNORMAL HIGH (ref 0.50–1.10)
GFR calc non Af Amer: 22 mL/min — ABNORMAL LOW (ref >90)
GFR, EST AFRICAN AMERICAN: 26 mL/min — AB (ref >90)
Glucose, Bld: 111 mg/dL — ABNORMAL HIGH (ref 70–99)
POTASSIUM: 5.3 meq/L (ref 3.7–5.3)
Sodium: 139 mEq/L (ref 137–147)

## 2014-04-04 LAB — TROPONIN I
TROPONIN I: 0.41 ng/mL — AB (ref <0.30)
Troponin I: <0.3 ng/mL (ref <0.30)

## 2014-04-04 LAB — PRO B NATRIURETIC PEPTIDE: PRO B NATRI PEPTIDE: 7846 pg/mL — AB (ref 0–125)

## 2014-04-04 LAB — POC OCCULT BLOOD, ED: Fecal Occult Bld: NEGATIVE

## 2014-04-04 LAB — PREPARE RBC (CROSSMATCH)

## 2014-04-04 LAB — MRSA PCR SCREENING: MRSA BY PCR: NEGATIVE

## 2014-04-04 MED ORDER — ALBUTEROL SULFATE (2.5 MG/3ML) 0.083% IN NEBU
2.5000 mg | INHALATION_SOLUTION | Freq: Once | RESPIRATORY_TRACT | Status: AC
  Administered 2014-04-04: 2.5 mg via RESPIRATORY_TRACT
  Filled 2014-04-04: qty 3

## 2014-04-04 MED ORDER — FUROSEMIDE 10 MG/ML IJ SOLN
40.0000 mg | Freq: Two times a day (BID) | INTRAMUSCULAR | Status: DC
  Administered 2014-04-04 – 2014-04-10 (×13): 40 mg via INTRAVENOUS
  Filled 2014-04-04 (×13): qty 4

## 2014-04-04 MED ORDER — SODIUM CHLORIDE 0.9 % IV SOLN
10.0000 mL/h | Freq: Once | INTRAVENOUS | Status: AC
  Administered 2014-04-04: 10 mL/h via INTRAVENOUS

## 2014-04-04 MED ORDER — FUROSEMIDE 10 MG/ML IJ SOLN
40.0000 mg | Freq: Once | INTRAMUSCULAR | Status: AC
  Administered 2014-04-04: 40 mg via INTRAVENOUS
  Filled 2014-04-04: qty 4

## 2014-04-04 MED ORDER — SODIUM CHLORIDE 0.9 % IV SOLN: INTRAVENOUS | Status: DC

## 2014-04-04 MED ORDER — SODIUM CHLORIDE 0.9 % IV SOLN
250.0000 mL | INTRAVENOUS | Status: DC | PRN
Start: 2014-04-04 — End: 2014-04-10

## 2014-04-04 MED ORDER — IPRATROPIUM-ALBUTEROL 0.5-2.5 (3) MG/3ML IN SOLN
3.0000 mL | Freq: Once | RESPIRATORY_TRACT | Status: AC
  Administered 2014-04-04: 3 mL via RESPIRATORY_TRACT
  Filled 2014-04-04: qty 3

## 2014-04-04 MED ORDER — GUAIFENESIN ER 600 MG PO TB12
1200.0000 mg | ORAL_TABLET | Freq: Two times a day (BID) | ORAL | Status: DC
  Administered 2014-04-04 – 2014-04-10 (×12): 1200 mg via ORAL
  Filled 2014-04-04 (×12): qty 2

## 2014-04-04 MED ORDER — ONDANSETRON HCL 4 MG/2ML IJ SOLN
4.0000 mg | Freq: Four times a day (QID) | INTRAMUSCULAR | Status: DC | PRN
  Filled 2014-04-04: qty 2

## 2014-04-04 MED ORDER — ACETAMINOPHEN 325 MG PO TABS
650.0000 mg | ORAL_TABLET | ORAL | Status: DC | PRN
  Administered 2014-04-06 – 2014-04-07 (×3): 650 mg via ORAL
  Filled 2014-04-04 (×3): qty 2

## 2014-04-04 MED ORDER — SODIUM CHLORIDE 0.9 % IJ SOLN
3.0000 mL | Freq: Two times a day (BID) | INTRAMUSCULAR | Status: DC
Start: 2014-04-04 — End: 2014-04-10
  Administered 2014-04-04 – 2014-04-10 (×12): 3 mL via INTRAVENOUS

## 2014-04-04 MED ORDER — SODIUM CHLORIDE 0.9 % IJ SOLN
3.0000 mL | INTRAMUSCULAR | Status: DC | PRN
Start: 2014-04-04 — End: 2014-04-10

## 2014-04-04 MED ORDER — ALBUTEROL SULFATE (2.5 MG/3ML) 0.083% IN NEBU: 2.5000 mg | INHALATION_SOLUTION | RESPIRATORY_TRACT | Status: DC

## 2014-04-04 MED ORDER — PANTOPRAZOLE SODIUM 40 MG IV SOLR
40.0000 mg | Freq: Two times a day (BID) | INTRAVENOUS | Status: DC
  Administered 2014-04-04 – 2014-04-05 (×2): 40 mg via INTRAVENOUS
  Filled 2014-04-04 (×2): qty 40

## 2014-04-04 MED ORDER — ATORVASTATIN CALCIUM 40 MG PO TABS
80.0000 mg | ORAL_TABLET | Freq: Every day | ORAL | Status: DC
  Administered 2014-04-05 – 2014-04-10 (×6): 80 mg via ORAL
  Filled 2014-04-04 (×7): qty 2

## 2014-04-04 MED ORDER — ALPRAZOLAM 0.25 MG PO TABS
0.2500 mg | ORAL_TABLET | Freq: Three times a day (TID) | ORAL | Status: DC | PRN
  Administered 2014-04-05 – 2014-04-09 (×6): 0.25 mg via ORAL
  Filled 2014-04-04 (×6): qty 1

## 2014-04-04 MED ORDER — SODIUM CHLORIDE 0.9 % IV SOLN
Freq: Once | INTRAVENOUS | Status: AC
  Administered 2014-04-04: 20:00:00 via INTRAVENOUS

## 2014-04-04 MED ORDER — AMIODARONE HCL 200 MG PO TABS
200.0000 mg | ORAL_TABLET | Freq: Every day | ORAL | Status: DC
  Administered 2014-04-05 – 2014-04-10 (×6): 200 mg via ORAL
  Filled 2014-04-04 (×6): qty 1

## 2014-04-04 MED ORDER — IPRATROPIUM-ALBUTEROL 0.5-2.5 (3) MG/3ML IN SOLN
3.0000 mL | RESPIRATORY_TRACT | Status: DC
  Administered 2014-04-04 – 2014-04-09 (×20): 3 mL via RESPIRATORY_TRACT
  Filled 2014-04-04 (×20): qty 3

## 2014-04-04 MED ORDER — HYDROCODONE-ACETAMINOPHEN 5-325 MG PO TABS
1.0000 | ORAL_TABLET | ORAL | Status: DC | PRN
Start: 2014-04-04 — End: 2014-04-10
  Administered 2014-04-05 – 2014-04-08 (×5): 1 via ORAL
  Filled 2014-04-04 (×5): qty 1

## 2014-04-04 MED ORDER — METHYLPREDNISOLONE SODIUM SUCC 125 MG IJ SOLR
60.0000 mg | Freq: Two times a day (BID) | INTRAMUSCULAR | Status: DC
  Administered 2014-04-04 – 2014-04-08 (×9): 60 mg via INTRAVENOUS
  Filled 2014-04-04 (×10): qty 2

## 2014-04-04 NOTE — ED Notes (Signed)
Charge RN signed off on transfusing unit for transfer.

## 2014-04-04 NOTE — ED Notes (Signed)
Initial EKG not crossing over in system. EKG repeated.

## 2014-04-04 NOTE — ED Notes (Signed)
Incorrect entry on Occult blood, specimen was Positive.

## 2014-04-04 NOTE — Progress Notes (Signed)
Called to pt's room, pt states "I just threw up, can I have some chicken broth".  Small amount of emesis noted in basin, pt denies nausea currently.

## 2014-04-04 NOTE — ED Provider Notes (Signed)
CSN: [id number]     Arrival date & time 04/04/14  1228 History   First MD Initiated Contact with Patient 04/04/14 1322     Chief Complaint  Patient presents with  . Shortness of Breath    HPI Pt was seen at 1330. Per pt and her family, c/o gradual onset and worsening of persistent SOB for the past 2 weeks. Has been associated with generalized fatigue and cough with "thick" mucus. States she has been using her home nebulizer and O2 N/C without relief. Denies abd pain, no N/V/D, no back pain, no CP/palpitations, no objective fevers. Pt has not called her PMD regarding her symptoms.     GI: [facility] Past Medical History  Diagnosis Date  . COPD (chronic obstructive pulmonary disease)   . Tobacco abuse   . Hypertension   . History of atrial septal defect     Closure in 50's   . Mental retardation, idiopathic mild   . Arthritis   . NSTEMI (non-ST elevated myocardial infarction)   . A-fib   . GERD (gastroesophageal reflux disease)   . Atrophy of right kidney   . CHF (congestive heart failure)   . Diverticulosis   . Hiatal hernia   . Barrett esophagus   . Ischemic cardiomyopathy   . On home O2     prn   Past Surgical History  Procedure Laterality Date  . Asd repair      in her 12s  . Total hip arthroplasty Right     in her 57s  . Total abdominal hysterectomy      in the 39s  . Tee without cardioversion N/A 05/08/2013    Procedure: TRANSESOPHAGEAL ECHOCARDIOGRAM (TEE);  Surgeon: Lelon Perla, MD;  Location: [facility] ENDOSCOPY;  Service: Cardiovascular;  Laterality: N/A;  . Cardioversion N/A 05/08/2013    Procedure: CARDIOVERSION;  Surgeon: Lelon Perla, MD;  Location: [facility] ENDOSCOPY;  Service: Cardiovascular;  Laterality: N/A;  Dr. Zoila Shutter verified with EP pt in a flutter...will cardiovert...  150 joules @ 4:40, using Propofol 40  mg/IV .Marland Kitchen.successful NSR   . Tonsillectomy    . Colonoscopy N/A 10/24/2013    Procedure: COLONOSCOPY;  Surgeon: Daneil Dolin, MD;  Location: [facility] ENDO  SUITE;  Service: Endoscopy;  Laterality: N/A;  10:30  . Esophagogastroduodenoscopy N/A 10/24/2013    Procedure: ESOPHAGOGASTRODUODENOSCOPY (EGD);  Surgeon: Daneil Dolin, MD;  Location: [facility] ENDO SUITE;  Service: Endoscopy;  Laterality: N/A;   Family History  Problem Relation Age of Onset  . Heart attack Brother   . Heart attack Brother   . Diabetes Mother   . Heart attack Father     Died at 81 from MI  . Heart attack Sister     stent at 61  . Heart attack Sister     stent at 25  . Cancer Brother     lung  . Cancer Brother     lung   . Colon cancer Brother     deceased, diagnosed in early 90s   History  Substance Use Topics  . Smoking status: Former Smoker -- 1.00 packs/day for 55 years    Types: Cigarettes  . Smokeless tobacco: Never Used     Comment: stopped Dec 2014  . Alcohol Use: No    Review of Systems ROS: Statement: All systems negative except as marked or noted in the HPI; Constitutional: Negative for fever and chills. +generalized fatigue.; ; Eyes: Negative for eye pain, redness and discharge. ; ; ENMT: Negative  for ear pain, hoarseness, nasal congestion, sinus pressure and sore throat. ; ; Cardiovascular: Negative for chest pain, palpitations, diaphoresis, and peripheral edema. ; ; Respiratory: +SOB, cough. Negative for wheezing and stridor. ; ; Gastrointestinal: Negative for nausea, vomiting, diarrhea, abdominal pain, blood in stool, hematemesis, jaundice and rectal bleeding.. ; ; Genitourinary: Negative for dysuria, flank pain and hematuria. ; ; Musculoskeletal: Negative for back pain and neck pain. Negative for swelling and trauma.; ; Skin: Negative for pruritus, rash, abrasions, blisters, bruising and skin lesion.; ; Neuro: Negative for headache, lightheadedness and neck stiffness. Negative for altered level of consciousness, altered mental status, extremity weakness, paresthesias, involuntary movement, seizure and syncope.      Allergies  Review of patient's  allergies indicates no known allergies.  Home Medications   Prior to Admission medications   Medication Sig Start Date End Date Taking? Authorizing Provider  acetaminophen (TYLENOL) 325 MG tablet Take 2 tablets (650 mg total) by mouth every 6 (six) hours as needed for mild pain or fever. 05/11/13  Yes Modena Jansky, MD  albuterol (PROVENTIL) (2.5 MG/3ML) 0.083% nebulizer solution Take 2.5 mg by nebulization every 6 (six) hours as needed for wheezing or shortness of breath.   Yes Historical Provider, MD  ALPRAZolam (XANAX) 0.25 MG tablet Take 0.25 mg by mouth 3 (three) times daily as needed for anxiety.  04/26/13  Yes Historical Provider, MD  amiodarone (PACERONE) 200 MG tablet Continue 200 mg twice a day until 05/31/13. Then decrease to 200 mg daily. Patient taking differently: Take 200 mg by mouth daily.  05/23/13  Yes Lendon Colonel, NP  apixaban (ELIQUIS) 2.5 MG TABS tablet Take 1 tablet (2.5 mg total) by mouth 2 (two) times daily. 05/11/13  Yes Modena Jansky, MD  aspirin EC 81 MG EC tablet Take 1 tablet (81 mg total) by mouth daily. 05/11/13  Yes Modena Jansky, MD  atorvastatin (LIPITOR) 80 MG tablet Take 80 mg by mouth daily.   Yes Historical Provider, MD  HYDROcodone-acetaminophen (NORCO/VICODIN) 5-325 MG per tablet Take 1 tablet by mouth every 4 (four) hours as needed for moderate pain.  04/16/13  Yes Historical Provider, MD  ibuprofen (ADVIL,MOTRIN) 200 MG tablet Take 400 mg by mouth every 6 (six) hours as needed for headache.   Yes Historical Provider, MD  ipratropium (ATROVENT HFA) 17 MCG/ACT inhaler Inhale 1 puff into the lungs every 4 (four) hours as needed for wheezing.    Yes Historical Provider, MD  ipratropium-albuterol (DUONEB) 0.5-2.5 (3) MG/3ML SOLN Take 3 mLs by nebulization every 6 (six) hours as needed (SHORTNESS OF BREATH).   Yes Historical Provider, MD  pantoprazole (PROTONIX) 40 MG tablet Take 40 mg by mouth daily.   Yes Historical Provider, MD  predniSONE  (DELTASONE) 10 MG tablet Take 10 mg by mouth daily with breakfast.   Yes Historical Provider, MD  Linaclotide (LINZESS) 145 MCG CAPS capsule Take 1 capsule (145 mcg total) by mouth daily. Take 30 minutes prior to breakfast. Patient not taking: Reported on 04/04/2014 10/11/13   Orvil Feil, NP  polyethylene glycol-electrolytes (TRILYTE) 420 G solution Take 4,000 mLs by mouth as directed. Patient not taking: Reported on 04/04/2014 10/11/13   Daneil Dolin, MD   BP 120/60 mmHg  Pulse 80  Temp(Src) 98.4 F (36.9 C) (Oral)  Resp 22  Wt 100 lb (45.36 kg)  SpO2 95% Physical Exam  1335: Physical examination:  Nursing notes reviewed; Vital signs and O2 SAT reviewed;  Constitutional: Well developed, Well  nourished, Well hydrated, In no acute distress; Head:  Normocephalic, atraumatic; Eyes: EOMI, PERRL, No scleral icterus. Conjunctiva pale; ENMT: Mouth and pharynx normal, Mucous membranes moist; Neck: Supple, Full range of motion, No lymphadenopathy; Cardiovascular: Regular rate and rhythm, No gallop; Respiratory: Breath sounds coarse & equal bilaterally, No wheezes. Speaking full sentences, Normal respiratory effort/excursion; Chest: Nontender, Movement normal; Abdomen: Soft, Nontender, Nondistended, Normal bowel sounds. Rectal exam performed w/permission of pt and ED RN chaperone present.  Anal tone normal.  Non-tender, soft brown stool in rectal vault, heme positive.  No fissures, no external hemorrhoids, no palp masses.; Genitourinary: No CVA tenderness; Extremities: Pulses normal, No tenderness, No edema, No calf edema or asymmetry.; Neuro: AA&Ox3, Major CN grossly intact.  Speech clear. No gross focal motor or sensory deficits in extremities.; Skin: Color pale, Warm, Dry.   ED Course  Procedures      EKG Interpretation None      MDM  MDM Reviewed: previous chart, nursing note and vitals Reviewed previous: labs and ECG Interpretation: labs, ECG and x-ray Total time providing critical care:  30-74 minutes. This excludes time spent performing separately reportable procedures and services. Consults: admitting MD   CRITICAL CARE Performed by: Alfonzo Feller Total critical care time: 35 Critical care time was exclusive of separately billable procedures and treating other patients. Critical care was necessary to treat or prevent imminent or life-threatening deterioration. Critical care was time spent personally by me on the following activities: development of treatment plan with patient and/or surrogate as well as nursing, discussions with consultants, evaluation of patient's response to treatment, examination of patient, obtaining history from patient or surrogate, ordering and performing treatments and interventions, ordering and review of laboratory studies, ordering and review of radiographic studies, pulse oximetry and re-evaluation of patient's condition.  Results for orders placed or performed during the hospital encounter of 04/04/14  CBC with Differential  Result Value Ref Range   WBC 16.5 (H) 4.0 - 10.5 K/uL   RBC 2.91 (L) 3.87 - 5.11 MIL/uL   Hemoglobin 6.2 (LL) 12.0 - 15.0 g/dL   HCT 22.7 (L) 36.0 - 46.0 %   MCV 78.0 78.0 - 100.0 fL   MCH 21.3 (L) 26.0 - 34.0 pg   MCHC 27.3 (L) 30.0 - 36.0 g/dL   RDW 19.1 (H) 11.5 - 15.5 %   Platelets 392 150 - 400 K/uL   Neutrophils Relative % 87 (H) 43 - 77 %   Neutro Abs 14.4 (H) 1.7 - 7.7 K/uL   Lymphocytes Relative 4 (L) 12 - 46 %   Lymphs Abs 0.7 0.7 - 4.0 K/uL   Monocytes Relative 8 3 - 12 %   Monocytes Absolute 1.3 (H) 0.1 - 1.0 K/uL   Eosinophils Relative 0 0 - 5 %   Eosinophils Absolute 0.0 0.0 - 0.7 K/uL   Basophils Relative 0 0 - 1 %   Basophils Absolute 0.0 0.0 - 0.1 K/uL   RBC Morphology SCHISTOCYTES NOTED ON SMEAR   Basic metabolic panel  Result Value Ref Range   Sodium 139 137 - 147 mEq/L   Potassium 5.3 3.7 - 5.3 mEq/L   Chloride 103 96 - 112 mEq/L   CO2 23 19 - 32 mEq/L   Glucose, Bld 111 (H) 70 - 99  mg/dL   BUN 25 (H) 6 - 23 mg/dL   Creatinine, Ser 2.16 (H) 0.50 - 1.10 mg/dL   Calcium 8.9 8.4 - 10.5 mg/dL   GFR calc non Af Amer 22 (L) >90  mL/min   GFR calc Af Amer 26 (L) >90 mL/min   Anion gap 13 5 - 15  Troponin I  Result Value Ref Range   Troponin I <0.30 <0.30 ng/mL  Pro b natriuretic peptide (BNP)  Result Value Ref Range   Pro B Natriuretic peptide (BNP) 7846.0 (H) 0 - 125 pg/mL   Dg Chest Portable 1 View 04/04/2014   CLINICAL DATA:  Cough.  EXAM: PORTABLE CHEST - 1 VIEW  COMPARISON:  05/11/2013  FINDINGS: There is new cardiomegaly with pulmonary vascular congestion and mild interstitial pulmonary edema bilaterally. Previous median sternotomy. No acute osseous abnormality.  IMPRESSION: Congestive heart failure.   Electronically Signed   By: Rozetta Nunnery M.D.   On: 04/04/2014 13:55   Results for URSULA, DERMODY (MRN [id number]) as of 04/04/2014 14:32  Ref. Range 05/11/2013 04:15 05/23/2013 14:04 09/04/2013 09:39 04/04/2014 12:51  BUN Latest Range: 6-23 mg/dL 35 (H) 23 20 25  (H)  Creatinine Latest Range: 0.50-1.10 mg/dL 1.54 (H) 1.53 (H) 1.91 (H) 2.16 (H)   Results for ALICIANA, RICCIARDI (MRN [id number]) as of 04/04/2014 14:32  Ref. Range 05/11/2013 04:15 05/23/2013 14:04 09/04/2013 09:39 10/11/2013 09:57 04/04/2014 12:51  Hemoglobin Latest Range: 12.0-15.0 g/dL 14.2 12.7 10.0 (L) 8.1 (L) 6.2 (LL)  HCT Latest Range: 36.0-46.0 % 43.2 37.5 31.6 (L) 28.8 (L) 22.7 (L)     1445:  Neb given on arrival for coarse lungs with improvement. BNP elevated with CHF on CXR; will dose IV lasix. New anemia; will transfuse PRBC's. T/C to [facility] Dr. Roderic Palau, case discussed, including:  HPI, pertinent PM/SHx, VS/PE, dx testing, ED course and treatment:  Agreeable to admit, requests to write temporary orders, obtain stepdown bed to Dr. Luan Pulling' service.   Francine Graven, DO 04/06/14 [phone]

## 2014-04-04 NOTE — Progress Notes (Signed)
Dr Stevenson Clinch & RN of [facility] updated on  Pt's Troponin=0.41

## 2014-04-04 NOTE — H&P (Signed)
[facility] History and Physical  Alexis Dillon OZH:[id number] DOB: Jul 05, 1945 DOA: 04/04/2014  Referring physician: Dr. Thurnell Garbe, ER physician PCP: Alonza Bogus, MD   Chief Complaint: Shortness of breath  HPI: Alexis Dillon is a 68 y.o. female who presents to the emergency room with complaints of shortness of breath. She reports her symptoms started to recur for the past 1-2 weeks, progressively got worse over the past few days. She has reported feeling feverish. She's had increased wheezing, cough. She denies any worsening edema. She reports occasional chest pain. She was evaluated in the emergency room where hemoglobin was found to be low at 6.2. Creatinine was near baseline at 2.1. Chest x-ray indicated evidence of congestive heart failure. The patient has known systolic dysfunction with ejection fraction of 20-25% on echocardiogram in 12/14. She denies any frank episodes of GI bleeding. She is on anticoagulation for atrial flutter. Per ER physician, she was noted to have positive FOBT. Patient is being admitted for further treatments.   Review of Systems:  Pertinent positives as per history of present illness, otherwise negative  Past Medical History  Diagnosis Date  . COPD (chronic obstructive pulmonary disease)   . Tobacco abuse   . Hypertension   . History of atrial septal defect     Closure in 50's   . Mental retardation, idiopathic mild   . Arthritis   . NSTEMI (non-ST elevated myocardial infarction)   . A-fib   . GERD (gastroesophageal reflux disease)   . Atrophy of right kidney   . CHF (congestive heart failure)   . Diverticulosis   . Hiatal hernia   . Barrett esophagus   . Ischemic cardiomyopathy   . On home O2     prn   Past Surgical History  Procedure Laterality Date  . Asd repair      in her 59s  . Total hip arthroplasty Right     in her 18s  . Total abdominal hysterectomy      in the 37s  . Tee without cardioversion N/A 05/08/2013    Procedure:  TRANSESOPHAGEAL ECHOCARDIOGRAM (TEE);  Surgeon: Lelon Perla, MD;  Location: [facility] ENDOSCOPY;  Service: Cardiovascular;  Laterality: N/A;  . Cardioversion N/A 05/08/2013    Procedure: CARDIOVERSION;  Surgeon: Lelon Perla, MD;  Location: [facility] ENDOSCOPY;  Service: Cardiovascular;  Laterality: N/A;  Dr. Zoila Shutter verified with EP pt in a flutter...will cardiovert...  150 joules @ 4:40, using Propofol 40  mg/IV .Marland Kitchen.successful NSR   . Tonsillectomy    . Colonoscopy N/A 10/24/2013    Procedure: COLONOSCOPY;  Surgeon: Daneil Dolin, MD;  Location: [facility] ENDO SUITE;  Service: Endoscopy;  Laterality: N/A;  10:30  . Esophagogastroduodenoscopy N/A 10/24/2013    Procedure: ESOPHAGOGASTRODUODENOSCOPY (EGD);  Surgeon: Daneil Dolin, MD;  Location: [facility] ENDO SUITE;  Service: Endoscopy;  Laterality: N/A;   Social History:  reports that she has quit smoking. Her smoking use included Cigarettes. She has a 55 pack-year smoking history. She has never used smokeless tobacco. She reports that she does not drink alcohol or use illicit drugs.  No Known Allergies  Family History  Problem Relation Age of Onset  . Heart attack Brother   . Heart attack Brother   . Diabetes Mother   . Heart attack Father     Died at 43 from MI  . Heart attack Sister     stent at 32  . Heart attack Sister     stent at 21  . Cancer Brother  lung  . Cancer Brother     lung   . Colon cancer Brother     deceased, diagnosed in early 27s     Prior to Admission medications   Medication Sig Start Date End Date Taking? Authorizing Provider  acetaminophen (TYLENOL) 325 MG tablet Take 2 tablets (650 mg total) by mouth every 6 (six) hours as needed for mild pain or fever. 05/11/13  Yes Modena Jansky, MD  albuterol (PROVENTIL) (2.5 MG/3ML) 0.083% nebulizer solution Take 2.5 mg by nebulization every 6 (six) hours as needed for wheezing or shortness of breath.   Yes Historical Provider, MD  ALPRAZolam (XANAX) 0.25 MG tablet Take 0.25 mg  by mouth 3 (three) times daily as needed for anxiety.  04/26/13  Yes Historical Provider, MD  amiodarone (PACERONE) 200 MG tablet Continue 200 mg twice a day until 05/31/13. Then decrease to 200 mg daily. Patient taking differently: Take 200 mg by mouth daily.  05/23/13  Yes Lendon Colonel, NP  apixaban (ELIQUIS) 2.5 MG TABS tablet Take 1 tablet (2.5 mg total) by mouth 2 (two) times daily. 05/11/13  Yes Modena Jansky, MD  aspirin EC 81 MG EC tablet Take 1 tablet (81 mg total) by mouth daily. 05/11/13  Yes Modena Jansky, MD  atorvastatin (LIPITOR) 80 MG tablet Take 80 mg by mouth daily.   Yes Historical Provider, MD  HYDROcodone-acetaminophen (NORCO/VICODIN) 5-325 MG per tablet Take 1 tablet by mouth every 4 (four) hours as needed for moderate pain.  04/16/13  Yes Historical Provider, MD  ibuprofen (ADVIL,MOTRIN) 200 MG tablet Take 400 mg by mouth every 6 (six) hours as needed for headache.   Yes Historical Provider, MD  ipratropium (ATROVENT HFA) 17 MCG/ACT inhaler Inhale 1 puff into the lungs every 4 (four) hours as needed for wheezing.    Yes Historical Provider, MD  ipratropium-albuterol (DUONEB) 0.5-2.5 (3) MG/3ML SOLN Take 3 mLs by nebulization every 6 (six) hours as needed (SHORTNESS OF BREATH).   Yes Historical Provider, MD  pantoprazole (PROTONIX) 40 MG tablet Take 40 mg by mouth daily.   Yes Historical Provider, MD  predniSONE (DELTASONE) 10 MG tablet Take 10 mg by mouth daily with breakfast.   Yes Historical Provider, MD  Linaclotide (LINZESS) 145 MCG CAPS capsule Take 1 capsule (145 mcg total) by mouth daily. Take 30 minutes prior to breakfast. Patient not taking: Reported on 04/04/2014 10/11/13   Orvil Feil, NP  polyethylene glycol-electrolytes (TRILYTE) 420 G solution Take 4,000 mLs by mouth as directed. Patient not taking: Reported on 04/04/2014 10/11/13   Daneil Dolin, MD   Physical Exam: Filed Vitals:   04/04/14 1615 04/04/14 1619 04/04/14 1659 04/04/14 1839  BP: 125/54    108/71  Pulse: 76  75 79  Temp:   97.8 F (36.6 C) 98.1 F (36.7 C)  TempSrc:   Oral Oral  Resp: 20   40  Height:   5\' 5"  (1.651 m)   Weight:   45 kg (99 lb 3.3 oz)   SpO2: 97% 97% 97% 97%    Wt Readings from Last 3 Encounters:  04/04/14 45 kg (99 lb 3.3 oz)  10/11/13 50.259 kg (110 lb 12.8 oz)  09/03/13 48.988 kg (108 lb)    General:  Appears calm and comfortable, appears pale Eyes: PERRL, normal lids, irises & conjunctiva ENT: grossly normal hearing, lips & tongue Neck: no LAD, masses or thyromegaly Cardiovascular: RRR, no m/r/g. No LE edema. Respiratory: Bilateral expiratory wheezes with crackles  at bases. Increased respiratory effort Abdomen: soft, ntnd Skin: no rash or induration seen on limited exam Musculoskeletal: grossly normal tone BUE/BLE Psychiatric: grossly normal mood and affect, speech fluent and appropriate Neurologic: grossly non-focal.          Labs on Admission:  Basic Metabolic Panel:  Recent Labs Lab 04/04/14 1251  NA 139  K 5.3  CL 103  CO2 23  GLUCOSE 111*  BUN 25*  CREATININE 2.16*  CALCIUM 8.9   Liver Function Tests: No results for input(s): AST, ALT, ALKPHOS, BILITOT, PROT, ALBUMIN in the last 168 hours. No results for input(s): LIPASE, AMYLASE in the last 168 hours. No results for input(s): AMMONIA in the last 168 hours. CBC:  Recent Labs Lab 04/04/14 1251  WBC 16.5*  NEUTROABS 14.4*  HGB 6.2*  HCT 22.7*  MCV 78.0  PLT 392   Cardiac Enzymes:  Recent Labs Lab 04/04/14 1251  TROPONINI <0.30    BNP (last 3 results)  Recent Labs  05/02/13 0236 04/04/14 1251  PROBNP 11638.0* 7846.0*   CBG: No results for input(s): GLUCAP in the last 168 hours.  Radiological Exams on Admission: Dg Chest Portable 1 View  04/04/2014   CLINICAL DATA:  Cough.  EXAM: PORTABLE CHEST - 1 VIEW  COMPARISON:  05/11/2013  FINDINGS: There is new cardiomegaly with pulmonary vascular congestion and mild interstitial pulmonary edema  bilaterally. Previous median sternotomy. No acute osseous abnormality.  IMPRESSION: Congestive heart failure.   Electronically Signed   By: Rozetta Nunnery M.D.   On: 04/04/2014 13:55    EKG: Independently reviewed. No acute changes  Assessment/Plan Active Problems:   COPD (chronic obstructive pulmonary disease)   Tobacco abuse   Acute respiratory failure   Acute systolic CHF (congestive heart failure)   CKD (chronic kidney disease), stage III   Atrial fibrillation   Anemia   Chronic anticoagulation   GI bleed   1. Anemia. Possibly related to acute blood loss. She is on anticoagulation. Will check anemia panel and transfuse 2 units PRBCs for now. 2. Possible GI bleed. Hold anticoagulation. Start on Protonix twice a day. GI consultation in the morning. 3. Acute respiratory failure. Likely related to CHF/COPD. Continue supportive oxygen weaned as tolerated. 4. Acute on chronic systolic congestive heart failure. Will repeat echocardiogram. Start the patient on Lasix. Monitor intake and output. 5. COPD exacerbation. She does have evidence of wheezing. Will start the patient bronchodilators and intravenous steroids. Start on mucolytics and flutter valve. 6. CkD stage III. Creatinine appears to be near baseline. We'll continue to follow with diuresis 7. Atrial fibrillation. Continue amiodarone for now. Hold anticoagulation.   Code Status: full code DVT Prophylaxis:scd Family Communication: discussed with patient Disposition Plan: discharge home once improved  Time spent: 57mins  MEMON,JEHANZEB [facility] Pager [phone]

## 2014-04-04 NOTE — ED Notes (Signed)
Report given to floor. Pt ready for transport. 

## 2014-04-04 NOTE — ED Notes (Signed)
Hgb 6.2, critical value given to Dr. Thurnell Garbe.

## 2014-04-04 NOTE — ED Notes (Signed)
EMS reports pt c/o SOB off and on x 2 weeks and intermittent fever.  Reports productive cough with thick mucus.  EMS placed pt on 4 liters and sat 100%.  Pt had albuterol breathing treatment around 1030.  Also reports history of anxiety.

## 2014-04-04 NOTE — ED Notes (Signed)
Pt consent given for blood transfusion.

## 2014-04-05 ENCOUNTER — Encounter (HOSPITAL_COMMUNITY)
Admission: EM | Disposition: A | Discharge status: Skilled Nursing Facility | Payer: Self-pay | Source: Home / Self Care | Attending: Pulmonary Disease

## 2014-04-05 ENCOUNTER — Encounter (HOSPITAL_COMMUNITY): Payer: Self-pay | Admitting: Gastroenterology

## 2014-04-05 DIAGNOSIS — I059 Rheumatic mitral valve disease, unspecified: Secondary | ICD-10-CM

## 2014-04-05 DIAGNOSIS — D649 Anemia, unspecified: Secondary | ICD-10-CM | POA: Diagnosis present

## 2014-04-05 DIAGNOSIS — R195 Other fecal abnormalities: Secondary | ICD-10-CM | POA: Diagnosis present

## 2014-04-05 DIAGNOSIS — I480 Paroxysmal atrial fibrillation: Secondary | ICD-10-CM

## 2014-04-05 DIAGNOSIS — I519 Heart disease, unspecified: Secondary | ICD-10-CM | POA: Diagnosis present

## 2014-04-05 DIAGNOSIS — D509 Iron deficiency anemia, unspecified: Secondary | ICD-10-CM | POA: Diagnosis present

## 2014-04-05 DIAGNOSIS — F70 Mild intellectual disabilities: Secondary | ICD-10-CM

## 2014-04-05 DIAGNOSIS — N184 Chronic kidney disease, stage 4 (severe): Secondary | ICD-10-CM | POA: Diagnosis present

## 2014-04-05 DIAGNOSIS — E785 Hyperlipidemia, unspecified: Secondary | ICD-10-CM | POA: Diagnosis present

## 2014-04-05 DIAGNOSIS — I5023 Acute on chronic systolic (congestive) heart failure: Secondary | ICD-10-CM

## 2014-04-05 DIAGNOSIS — I10 Essential (primary) hypertension: Secondary | ICD-10-CM

## 2014-04-05 DIAGNOSIS — I255 Ischemic cardiomyopathy: Secondary | ICD-10-CM

## 2014-04-05 DIAGNOSIS — J438 Other emphysema: Secondary | ICD-10-CM | POA: Diagnosis present

## 2014-04-05 DIAGNOSIS — Z7901 Long term (current) use of anticoagulants: Secondary | ICD-10-CM

## 2014-04-05 DIAGNOSIS — Z720 Tobacco use: Secondary | ICD-10-CM

## 2014-04-05 HISTORY — PX: AGILE CAPSULE: SHX5420

## 2014-04-05 LAB — RAPID URINE DRUG SCREEN, HOSP PERFORMED
AMPHETAMINES: NOT DETECTED
Barbiturates: NOT DETECTED
Benzodiazepines: NOT DETECTED
COCAINE: NOT DETECTED
OPIATES: NOT DETECTED
Tetrahydrocannabinol: NOT DETECTED

## 2014-04-05 LAB — CBC WITH DIFFERENTIAL/PLATELET
Basophils Absolute: 0 10*3/uL (ref 0.0–0.1)
Basophils Relative: 0 % (ref 0–1)
EOS PCT: 0 % (ref 0–5)
Eosinophils Absolute: 0 10*3/uL (ref 0.0–0.7)
HEMATOCRIT: 26.8 % — AB (ref 36.0–46.0)
Hemoglobin: 8 g/dL — ABNORMAL LOW (ref 12.0–15.0)
LYMPHS PCT: 3 % — AB (ref 12–46)
Lymphs Abs: 0.5 10*3/uL — ABNORMAL LOW (ref 0.7–4.0)
MCH: 22.9 pg — ABNORMAL LOW (ref 26.0–34.0)
MCHC: 29.9 g/dL — ABNORMAL LOW (ref 30.0–36.0)
MCV: 76.6 fL — AB (ref 78.0–100.0)
Monocytes Absolute: 0.3 10*3/uL (ref 0.1–1.0)
Monocytes Relative: 2 % — ABNORMAL LOW (ref 3–12)
Neutro Abs: 14.7 10*3/uL — ABNORMAL HIGH (ref 1.7–7.7)
Neutrophils Relative %: 95 % — ABNORMAL HIGH (ref 43–77)
PLATELETS: 399 10*3/uL (ref 150–400)
RBC: 3.5 MIL/uL — AB (ref 3.87–5.11)
RDW: 17.9 % — ABNORMAL HIGH (ref 11.5–15.5)
WBC: 15.5 10*3/uL — AB (ref 4.0–10.5)

## 2014-04-05 LAB — TROPONIN I
Troponin I: 0.43 ng/mL (ref <0.30)
Troponin I: <0.3 ng/mL (ref <0.30)

## 2014-04-05 LAB — BASIC METABOLIC PANEL
Anion gap: 15 (ref 5–15)
BUN: 24 mg/dL — ABNORMAL HIGH (ref 6–23)
CO2: 25 meq/L (ref 19–32)
Calcium: 8.8 mg/dL (ref 8.4–10.5)
Chloride: 100 mEq/L (ref 96–112)
Creatinine, Ser: 2.2 mg/dL — ABNORMAL HIGH (ref 0.50–1.10)
GFR calc Af Amer: 25 mL/min — ABNORMAL LOW (ref >90)
GFR calc non Af Amer: 22 mL/min — ABNORMAL LOW (ref >90)
GLUCOSE: 140 mg/dL — AB (ref 70–99)
POTASSIUM: 4.2 meq/L (ref 3.7–5.3)
Sodium: 140 mEq/L (ref 137–147)

## 2014-04-05 LAB — FOLATE: Folate: >20 ng/mL

## 2014-04-05 LAB — IRON AND TIBC
Iron: 99 ug/dL (ref 42–135)
Saturation Ratios: 25 % (ref 20–55)
TIBC: 390 ug/dL (ref 250–470)
UIBC: 291 ug/dL (ref 125–400)

## 2014-04-05 LAB — TSH: TSH: 3.99 u[IU]/mL (ref 0.350–4.500)

## 2014-04-05 LAB — FERRITIN: FERRITIN: 18 ng/mL (ref 10–291)

## 2014-04-05 LAB — VITAMIN B12: VITAMIN B 12: 456 pg/mL (ref 211–911)

## 2014-04-05 SURGERY — AGILE CAPSULE

## 2014-04-05 MED ORDER — SODIUM CHLORIDE 0.9 % IV SOLN
Freq: Once | INTRAVENOUS | Status: AC
  Administered 2014-04-05: 09:00:00 via INTRAVENOUS

## 2014-04-05 MED ORDER — PANTOPRAZOLE SODIUM 40 MG PO TBEC
40.0000 mg | DELAYED_RELEASE_TABLET | Freq: Two times a day (BID) | ORAL | Status: DC
  Administered 2014-04-05 – 2014-04-10 (×11): 40 mg via ORAL
  Filled 2014-04-05 (×10): qty 1

## 2014-04-05 MED ORDER — ACETAMINOPHEN 325 MG PO TABS
650.0000 mg | ORAL_TABLET | Freq: Once | ORAL | Status: AC
  Administered 2014-04-05: 650 mg via ORAL
  Filled 2014-04-05: qty 2

## 2014-04-05 MED ORDER — DIPHENHYDRAMINE HCL 25 MG PO CAPS
25.0000 mg | ORAL_CAPSULE | Freq: Once | ORAL | Status: AC
  Administered 2014-04-05: 25 mg via ORAL
  Filled 2014-04-05: qty 1

## 2014-04-05 MED ORDER — CETYLPYRIDINIUM CHLORIDE 0.05 % MT LIQD
7.0000 mL | Freq: Two times a day (BID) | OROMUCOSAL | Status: DC
  Administered 2014-04-05 – 2014-04-10 (×9): 7 mL via OROMUCOSAL

## 2014-04-05 NOTE — Plan of Care (Signed)
Problem: Phase I Progression Outcomes Goal: Dyspnea controlled at rest (HF) Outcome: Completed/Met Date Met:  04/05/14

## 2014-04-05 NOTE — Care Management Note (Addendum)
    Page 1 of 1   04/10/2014     9:11:58 AM CARE MANAGEMENT NOTE 04/10/2014  Patient:  Alexis Dillon, Alexis Dillon   Account Number:  [id number]  Date Initiated:  04/05/2014  Documentation initiated by:  Jolene Provost  Subjective/Objective Assessment:   Pt is from home, lives with sister. Pt has no HH services or medicaiton needs prior to admission. Pt states she is not on home O2 thinks she needs it.     Action/Plan:   Pt plans to discharge home. Pt may need home O2 eval and HH services, pending MD's recommendations. Pt's RN made aware of possible needs. Will continue to follow for CM needs.   Anticipated DC Date:  04/07/2014   Anticipated DC Plan:  [place]  CM consult      Choice offered to / List presented to:             Status of service:  Completed, signed off Medicare Important Message given?  YES (If response is "NO", the following Medicare IM given date fields will be blank) Date Medicare IM given:  04/05/2014 Medicare IM given by:  Vladimir Creeks Date Additional Medicare IM given:  04/10/2014 Additional Medicare IM given by:  Theophilus Kinds  Discharge Disposition:  [place]  Per UR Regulation:    If discussed at Long Length of Stay Meetings, dates discussed:   04/09/2014    Comments:  04/10/14 0910 Christinia Gully, RN BSN CM Pt to be discharged after TSC to [facility]. CSW to arrange discharge to facility.  04/08/14 [place], RN BSN CM Pt for TCS tomorrow. PT recommends SNF and pt is agreeable. CSW is aware and will arrange discharge to facility when medically stable.  04/05/2014 Jolene Provost, RN, MSN, PCCN

## 2014-04-05 NOTE — Progress Notes (Signed)
  Echocardiogram 2D Echocardiogram has been performed.  Alexis Dillon, [place] 04/05/2014, 3:32 PM

## 2014-04-05 NOTE — Progress Notes (Signed)
Pt to be transferred to unit 300. Assessment is unchanged from this AM. Pt is aware and agreeable to transfer and RN has been given report. All belongings transferred with pt.

## 2014-04-05 NOTE — Consult Note (Signed)
CARDIOLOGY CONSULT NOTE   Patient ID: Nikala Walsworth MRN: [id number] DOB/AGE: Sep 14, 1945 68 y.o.  Admit Date: 04/04/2014 Referring Physician: Sinda Du MD Primary Physician: Alonza Bogus, MD Consulting Cardiologist: Kate Sable MD Primary Cardiologist: Jenkins Rouge Reason for Consultation: Acute CHF in the setting of anemia and positive FOBT  Clinical Summary Ms. Pisani is a 68 y.o.female with known history of multivessel CAD not a good candidate for CABG per note in 05/07/2013,, atrial fibrillation with a CHADS VASC score of 4 on Eliquis and amiodarone, s/p successful TEE DCCV 04/2013,  ischemic cardiomyopathy with an EF of 20-25% with other history to include COPD stage III chronic kidney disease and ongoing tobacco abuse admitted with worsening dyspnea, CHF and anemia with Hgb of 6.2 She has been seen on consultation by GI and anticoagulation has been held. 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 They are planning Agile capsule study and possible repeat colonoscopy.  She states symptoms began 2 weeks ago with worsening breathing status. Denies tarry stools or hemoptysis, or hematemesis. She had some gastric discomfort and was unable to eat cereal the morning of admission and came to ER.   In ER she was found to have BP of 120/60, HR 80, O2 sat 95%.  Creatinine of 2.16. PLTs of 392. Pro-BNP 7,846. Troponin 0.30. CXR CHF. EKG NSR with RBBB, ST-depression inferio/lateral. She was treated with IV lasix 40 IV, given duoneb inhalers. She initially has diuresed 1.031 liters. Breathing status is minimally better. She denies LEE, chest pain prior to admission, but does endorse dizziness, weakness, and lack of appetite..   No Known Allergies  Medications Scheduled Medications: . amiodarone  200 mg Oral Daily  . antiseptic oral rinse  7 mL Mouth Rinse BID  . atorvastatin  80  mg Oral q1800  . furosemide  40 mg Intravenous BID  . guaiFENesin  1,200 mg Oral BID  . ipratropium-albuterol  3 mL Nebulization Q4H  . methylPREDNISolone (SOLU-MEDROL) injection  60 mg Intravenous Q12H  . pantoprazole (PROTONIX) IV  40 mg Intravenous Q12H  . sodium chloride  3 mL Intravenous Q12H    Infusions:    PRN Medications: sodium chloride, acetaminophen, ALPRAZolam, HYDROcodone-acetaminophen, ondansetron (ZOFRAN) IV, sodium chloride   Past Medical History  Diagnosis Date  . COPD (chronic obstructive pulmonary disease)   . Tobacco abuse   . Hypertension   . History of atrial septal defect     Closure in 50's   . Mental retardation, idiopathic mild   . Arthritis   . NSTEMI (non-ST elevated myocardial infarction)   . A-fib 04/2013    S/P TEE/DCCV  . GERD (gastroesophageal reflux disease)   . Atrophy of right kidney   . CHF (congestive heart failure)   . Diverticulosis   . Hiatal hernia   . Barrett esophagus   . Ischemic cardiomyopathy     EF of 25%  . On home O2     prn  . CAD (coronary artery disease) 04/2013    oronary dominance: right Left mainstem: LM calcified with long 30% stenosis.     Past Surgical History  Procedure Laterality Date  . Asd repair      in her 71s  . Total hip arthroplasty Right     in her 30s  . Total abdominal hysterectomy      in the 15s  . Tee without cardioversion N/A 05/08/2013  Procedure: TRANSESOPHAGEAL ECHOCARDIOGRAM (TEE);  Surgeon: Lelon Perla, MD;  Location: [facility] ENDOSCOPY;  Service: Cardiovascular;  Laterality: N/A;  . Cardioversion N/A 05/08/2013    Procedure: CARDIOVERSION;  Surgeon: Lelon Perla, MD;  Location: [facility] ENDOSCOPY;  Service: Cardiovascular;  Laterality: N/A;  Dr. Zoila Shutter verified with EP pt in a flutter...will cardiovert...  150 joules @ 4:40, using Propofol 40  mg/IV .Marland Kitchen.successful NSR   . Tonsillectomy    . Colonoscopy N/A 10/24/2013    Dr. Gala Romney: 3 polyps, one removed piecemeal (tubulovillous  adenoma). Next TCS 04/2014  . Esophagogastroduodenoscopy N/A 10/24/2013    Dr. Rourk:Barrett's without dysplasia. Next EGD 10/2014  . Cardiac catheterization  04/2013    Left mainstem: LM calcified with long 30% stenosis. Left anterior descending (LAD):  The LAD is large and wraps the apex.  The mid vessel has 50% stenosis followed by a focal 80% stenosis.  D1 small with ostial 75% stenosis.Left anterior descending (LAD):  The LAD is large and wraps the apex.  The mid vessel has 50% stenosis followed by a focal 80% stenosis.  D1 small with ostial 75% stenosis.       Family History  Problem Relation Age of Onset  . Heart attack Brother   . Heart attack Brother   . Diabetes Mother   . Heart attack Father     Died at 45 from MI  . Heart attack Sister     stent at 61  . Heart attack Sister     stent at 1  . Cancer Brother     lung  . Cancer Brother     lung   . Colon cancer Brother     deceased, diagnosed in early 8s    Social History Ms. Macneal reports that she has quit smoking. Her smoking use included Cigarettes. She has a 55 pack-year smoking history. She has never used smokeless tobacco. Ms. Kishbaugh reports that she does not drink alcohol.  Review of Systems Complete review of systems are found to be negative unless outlined in H&P above.  Physical Examination Blood pressure 115/70, pulse 75, temperature 97.1 F (36.2 C), temperature source Oral, resp. rate 17, height 5\' 5"  (1.651 m), weight 101 lb 3.1 oz (45.9 kg), SpO2 95 %.  Intake/Output Summary (Last 24 hours) at 04/05/14 1317 Last data filed at 04/05/14 1016  Gross per 24 hour  Intake 2573.83 ml  Output   3000 ml  Net -426.17 ml    Telemetry: Sinus tachycardia with RBBB.   GEN: No acute distress but overall weak HEENT: Conjunctiva and lids normal, oropharynx clear with moist mucosa. Neck: Supple, no elevated JVP or carotid bruits, no thyromegaly. Lungs: Diminished in the bases, but has poor inspiratory effort.  NO wheezes or coughing Cardiac: Regular rate and rhythm, distant heart sounds, no S3 or significant systolic murmur, no pericardial rub. Abdomen: Soft, nontender, no hepatomegaly, bowel sounds present, no guarding or rebound. Extremities: No pitting edema, distal pulses 2+. Skin: Warm and dry. Musculoskeletal: No kyphosis. Neuropsychiatric: Alert and oriented x3, affect grossly appropriate.  Prior Cardiac Testing/Procedures Echocardiogram 05/02/2014 Left ventricle: The cavity size was moderately dilated. Wall thickness was normal. Systolic function was severely reduced. The estimated ejection fraction was in the range of 20% to 25%. - Mitral valve: Mild regurgitation. - Right ventricle: The cavity size was moderately dilated. Systolic function was moderately reduced. Impressions:  Patient has severe LV and RV failure.  Cardiac Cath 05/04/2013 Coronary angiography:  Coronary dominance: right Left mainstem: LM  calcified with long 30% stenosis.  Left anterior descending (LAD): The LAD is large and wraps the apex. The mid vessel has 50% stenosis followed by a focal 80% stenosis. D1 small with ostial 75% stenosis.  Left circumflex (LCx): AV groove is a large vessel. There is mid 30% stenosis. MOM appears to be moderate sized and is occluded at the ostium. There is scant left to left collateralization. RI is small with mid 90% stenosis.  Right coronary artery (RCA): Dominant. Occluded at the ostium. Left to right collaterals.  Left ventriculography: Left ventricular was not injected secondary to CKD. She is known to have an EF of 20%.   Final Conclusions: Severe 3 vessel CAD with ischemic cardiomyopathy.   Recommendations: We will need to consider high risk revascularization. CABG vs. PCI of the LAD.   Lab Results  Basic Metabolic Panel:  Recent Labs Lab 04/04/14 1251 04/05/14 0218  NA 139 140  K 5.3 4.2  CL 103 100  CO2 23 25  GLUCOSE 111* 140*   BUN 25* 24*  CREATININE 2.16* 2.20*  CALCIUM 8.9 8.8    CBC:  Recent Labs Lab 04/04/14 1251 04/04/14 2028 04/05/14 0218  WBC 16.5* 15.1* 15.5*  NEUTROABS 14.4*  --  14.7*  HGB 6.2* 8.0* 8.0*  HCT 22.7* 27.1* 26.8*  MCV 78.0 77.9* 76.6*  PLT 392 395 399    Cardiac Enzymes:  Recent Labs Lab 04/04/14 1251 04/04/14 2028 04/05/14 0218 04/05/14 0753  TROPONINI <0.30 0.41* 0.43* <0.30    Radiology: Dg Chest Portable 1 View  04/04/2014   CLINICAL DATA:  Cough.  EXAM: PORTABLE CHEST - 1 VIEW  COMPARISON:  05/11/2013  FINDINGS: There is new cardiomegaly with pulmonary vascular congestion and mild interstitial pulmonary edema bilaterally. Previous median sternotomy. No acute osseous abnormality.  IMPRESSION: Congestive heart failure.   Electronically Signed   By: Rozetta Nunnery M.D.   On: 04/04/2014 13:55     ECG: NSR with RBBB, infero/lateral ST-T wave depression.    Impression and Recommendations  1. ICM with decompensated systolic CHF: Multifactorial with profound anemia, Hgb of 6.2. Troponin only slightly elevated at 0.41, 0.43, 0.30, respectively. Likely related to demand ischemia, She has been diuresing on IV lasix but is now receiving blood transfusion as well. Down one liter since admission. Careful fluid balance in this setting. No ACE or ARB with CKD.   2. Atrial fib: Agree with discontinuation of Eliquis at this time until source of bleeding is identified by GI. Continue amiodarone, for rate control. Not on BB due to COPD. Heart rate remains 70'-80 and in NSR despite anemia.   3. CAD: Most recent cath in 2014 demonstrated multivessel disease nota candidate for CABG. Medical therapy only with multiple co-morbidities. Continue statin  4. Anemia: As stated above. She has hx of Barrettes Esophagus, but no dysplasia. Denies hematemisis, melena, or hemoptysis. Planned for capsule study. Holding Eliquis.   5. COPD: Followed by Dr. Luan Pulling  6. Ongoing tobacco abuse:  Cessation    Signed: Phill Myron. Lawrence NP [place]  04/05/2014, 1:17 PM Co-Sign MD

## 2014-04-05 NOTE — Care Management Utilization Note (Signed)
UR review complete.  

## 2014-04-05 NOTE — Consult Note (Signed)
Referring Provider: Alonza Bogus, MD Primary Care Physician:  Alonza Bogus, MD Primary Gastroenterologist:  Garfield Cornea, MD  Reason for Consultation:  Possible GI bleed  HPI: Alexis Dillon is a 68 y.o. female presented with SOB that started about 1-2 weeks ago and worse over the last few days. She has COPD, Afib, ischemic cardiomyopathy at baseline. LVEF of 20-25% on echocardiogram 1 year ago. Previous NSTEMI. In the ER she had Hgb of 6.2. She is on Eliquis. She was heme + in the ER. Chest x-ray consistent with congestive heart failure.  She was seen by [place] [facility] in 09/2013 for heme + stool, FH of CRC, chronic anemia. She had EGD/TCS in 10/2013 and was noted to have Barrett's esophagus without dysplasia, multiple colonic polyps, one was removed piecemeal (tubulovillous adenoma). She is due for short interval follow up colonoscopy next month.  05/23/2013: Hgb 12.7 09/04/2013: Hgb 10 10/11/2013: Hgb 8.1 04/04/2014: Hgb 6.2  Patient is breathing more comfortably today. Really denies any chest pain currently, states she has some off and on. Difficult historian. She has had a bump in her troponin and cardiology consult pending.    She denies any melena, rectal bleeding, abdominal pain. Denies heartburn, vomiting, dysphagia. Denies weight loss. Denies regular NSAID use. She has some constipation intermittently, especially if she "eats the wrong foods".    Prior to Admission medications   Medication Sig Start Date End Date Taking? Authorizing Provider  acetaminophen (TYLENOL) 325 MG tablet Take 2 tablets (650 mg total) by mouth every 6 (six) hours as needed for mild pain or fever. 05/11/13  Yes Modena Jansky, MD  albuterol (PROVENTIL) (2.5 MG/3ML) 0.083% nebulizer solution Take 2.5 mg by nebulization every 6 (six) hours as needed for wheezing or shortness of breath.   Yes Historical Provider, MD  ALPRAZolam (XANAX) 0.25 MG tablet Take 0.25 mg by mouth 3 (three) times daily as needed for anxiety.   04/26/13  Yes Historical Provider, MD  amiodarone (PACERONE) 200 MG tablet Continue 200 mg twice a day until 05/31/13. Then decrease to 200 mg daily. Patient taking differently: Take 200 mg by mouth daily.  05/23/13  Yes Lendon Colonel, NP  apixaban (ELIQUIS) 2.5 MG TABS tablet Take 1 tablet (2.5 mg total) by mouth 2 (two) times daily. 05/11/13  Yes Modena Jansky, MD  aspirin EC 81 MG EC tablet Take 1 tablet (81 mg total) by mouth daily. 05/11/13  Yes Modena Jansky, MD  atorvastatin (LIPITOR) 80 MG tablet Take 80 mg by mouth daily.   Yes Historical Provider, MD  HYDROcodone-acetaminophen (NORCO/VICODIN) 5-325 MG per tablet Take 1 tablet by mouth every 4 (four) hours as needed for moderate pain.  04/16/13  Yes Historical Provider, MD  ibuprofen (ADVIL,MOTRIN) 200 MG tablet Take 400 mg by mouth every 6 (six) hours as needed for headache.   Yes Historical Provider, MD  ipratropium (ATROVENT HFA) 17 MCG/ACT inhaler Inhale 1 puff into the lungs every 4 (four) hours as needed for wheezing.    Yes Historical Provider, MD  ipratropium-albuterol (DUONEB) 0.5-2.5 (3) MG/3ML SOLN Take 3 mLs by nebulization every 6 (six) hours as needed (SHORTNESS OF BREATH).   Yes Historical Provider, MD  pantoprazole (PROTONIX) 40 MG tablet Take 40 mg by mouth daily.   Yes Historical Provider, MD  predniSONE (DELTASONE) 10 MG tablet Take 10 mg by mouth daily with breakfast.   Yes Historical Provider, MD  Linaclotide (LINZESS) 145 MCG CAPS capsule Take 1 capsule (145 mcg total) by  mouth daily. Take 30 minutes prior to breakfast. Patient not taking: Reported on 04/04/2014 10/11/13   Orvil Feil, NP            Current Facility-Administered Medications  Medication Dose Route Frequency Provider Last Rate Last Dose  . 0.9 %  sodium chloride infusion  250 mL Intravenous PRN Kathie Dike, MD      . acetaminophen (TYLENOL) tablet 650 mg  650 mg Oral Q4H PRN Kathie Dike, MD      . ALPRAZolam Duanne Moron) tablet 0.25 mg  0.25  mg Oral TID PRN Kathie Dike, MD   0.25 mg at 04/05/14 0732  . amiodarone (PACERONE) tablet 200 mg  200 mg Oral Daily Kathie Dike, MD   200 mg at 04/05/14 0732  . atorvastatin (LIPITOR) tablet 80 mg  80 mg Oral q1800 Kathie Dike, MD      . furosemide (LASIX) injection 40 mg  40 mg Intravenous BID Kathie Dike, MD   40 mg at 04/05/14 0738  . guaiFENesin (MUCINEX) 12 hr tablet 1,200 mg  1,200 mg Oral BID Kathie Dike, MD   1,200 mg at 04/05/14 0732  . HYDROcodone-acetaminophen (NORCO/VICODIN) 5-325 MG per tablet 1 tablet  1 tablet Oral Q4H PRN Kathie Dike, MD   1 tablet at 04/05/14 0732  . ipratropium-albuterol (DUONEB) 0.5-2.5 (3) MG/3ML nebulizer solution 3 mL  3 mL Nebulization Q4H Kathie Dike, MD   3 mL at 04/04/14 2037  . methylPREDNISolone sodium succinate (SOLU-MEDROL) 125 mg/2 mL injection 60 mg  60 mg Intravenous Q12H Kathie Dike, MD   60 mg at 04/05/14 0737  . ondansetron (ZOFRAN) injection 4 mg  4 mg Intravenous Q6H PRN Kathie Dike, MD      . pantoprazole (PROTONIX) injection 40 mg  40 mg Intravenous Q12H Kathie Dike, MD   40 mg at 04/05/14 0736  . sodium chloride 0.9 % injection 3 mL  3 mL Intravenous Q12H Kathie Dike, MD   3 mL at 04/05/14 1000  . sodium chloride 0.9 % injection 3 mL  3 mL Intravenous PRN Kathie Dike, MD        Allergies as of 04/04/2014  . (No Known Allergies)    Past Medical History  Diagnosis Date  . COPD (chronic obstructive pulmonary disease)   . Tobacco abuse   . Hypertension   . History of atrial septal defect     Closure in 50's   . Mental retardation, idiopathic mild   . Arthritis   . NSTEMI (non-ST elevated myocardial infarction)   . A-fib   . GERD (gastroesophageal reflux disease)   . Atrophy of right kidney   . CHF (congestive heart failure)   . Diverticulosis   . Hiatal hernia   . Barrett esophagus   . Ischemic cardiomyopathy   . On home O2     prn    Past Surgical History  Procedure Laterality Date  .  Asd repair      in her 80s  . Total hip arthroplasty Right     in her 5s  . Total abdominal hysterectomy      in the 57s  . Tee without cardioversion N/A 05/08/2013    Procedure: TRANSESOPHAGEAL ECHOCARDIOGRAM (TEE);  Surgeon: Lelon Perla, MD;  Location[facility] ENDOSCOPY;  Service: Cardiovascular;  Laterality: N/A;  . Cardioversion N/A 05/08/2013    Procedure: CARDIOVERSION;  Surgeon: Lelon Perla, MD;  Location: [facility] ENDOSCOPY;  Service: Cardiovascular;  Laterality: N/A;  Dr. Zoila Shutter verified with EP pt  in a flutter...will cardiovert...  150 joules @ 4:40, using Propofol 40  mg/IV .Marland Kitchen.successful NSR   . Tonsillectomy    . Colonoscopy N/A 10/24/2013    Dr. Gala Romney: 3 polyps, one removed piecemeal (tubulovillous adenoma). Next TCS 04/2014  . Esophagogastroduodenoscopy N/A 10/24/2013    Dr. Rourk:Barrett's without dysplasia. Next EGD 10/2014    Family History  Problem Relation Age of Onset  . Heart attack Brother   . Heart attack Brother   . Diabetes Mother   . Heart attack Father     Died at 3 from MI  . Heart attack Sister     stent at 13  . Heart attack Sister     stent at 83  . Cancer Brother     lung  . Cancer Brother     lung   . Colon cancer Brother     deceased, diagnosed in early 72s    History   Social History  . Marital Status: Divorced    Spouse Name: N/A    Number of Children: N/A  . Years of Education: N/A   Occupational History  . Not on file.   Social History Main Topics  . Smoking status: Former Smoker -- 1.00 packs/day for 55 years    Types: Cigarettes  . Smokeless tobacco: Never Used     Comment: stopped Dec 2014  . Alcohol Use: No  . Drug Use: No  . Sexual Activity: Not on file   Other Topics Concern  . Not on file   Social History Narrative   She has mild mental retardation. Patient not married and lives with her sister. Divorced with no children.      ROS:  General: Negative for anorexia, weight loss, fever, chills, fatigue,  weakness. Eyes: Negative for vision changes.  ENT: Negative for hoarseness, difficulty swallowing , nasal congestion. CV: Negative for chest pain, angina, palpitations, dyspnea on exertion, peripheral edema. See hpi. Respiratory: positive for wheezing, cough, SOB/DOE.  GI: See history of present illness. GU:  Negative for dysuria, hematuria, urinary incontinence, urinary frequency, nocturnal urination.  MS: Negative for joint pain, low back pain.  Derm: Negative for rash or itching.  Neuro: Negative for weakness, abnormal sensation, seizure, frequent headaches, memory loss, confusion.  Psych: Negative for anxiety, depression, suicidal ideation, hallucinations.  Endo: Negative for unusual weight change.  Heme: Negative for bruising or bleeding. Allergy: Negative for rash or hives.       Physical Examination: Vital signs in last 24 hours: Temp:  [97 F (36.1 C)-98.5 F (36.9 C)] 97 F (36.1 C) (11/20 0400) Pulse Rate:  [60-81] 71 (11/20 0700) Resp:  [16-40] 18 (11/20 0700) BP: (72-149)/(39-101) 121/58 mmHg (11/20 0700) SpO2:  [89 %-99 %] 99 % (11/20 0700) FiO2 (%):  [24 %] 24 % (11/19 1949) Weight:  [99 lb 3.3 oz (45 kg)-101 lb 3.1 oz (45.9 kg)] 101 lb 3.1 oz (45.9 kg) (11/20 0500) Last BM Date: 04/04/14  General: Well-nourished, well-developed in no acute distress. Difficult historian. Head: Normocephalic, atraumatic.   Eyes: Conjunctiva pink, no icterus. Mouth: Oropharyngeal mucosa moist and pink , no lesions erythema or exudate. Neck: Supple without thyromegaly, masses, or lymphadenopathy.  Lungs:   rhonchi noted bilaterally Heart: Regular rate and rhythm, no murmurs rubs or gallops.  Abdomen: Bowel sounds are normal, nontender, nondistended, no hepatosplenomegaly or masses, no abdominal bruits or    hernia , no rebound or guarding.   Rectal: not performed. Extremities: No lower extremity edema, clubbing, deformity.  Neuro: Alert and oriented x 4 , grossly normal  neurologically.  Skin: Warm and dry, no rash or jaundice.   Psych: Alert and cooperative, normal mood and affect.        Intake/Output from previous day: 11/19 0701 - 11/20 0700 In: 1768.8 [P.O.:1440; I.V.:36.8; Blood:292] Out: 1610 [[id number]] Intake/Output this shift:    Lab Results: CBC  Recent Labs  04/04/14 1251 04/04/14 2028 04/05/14 0218  WBC 16.5* 15.1* 15.5*  HGB 6.2* 8.0* 8.0*  HCT 22.7* 27.1* 26.8*  MCV 78.0 77.9* 76.6*  PLT 392 395 399   BMET  Recent Labs  04/04/14 1251 04/05/14 0218  NA 139 140  K 5.3 4.2  CL 103 100  CO2 23 25  GLUCOSE 111* 140*  BUN 25* 24*  CREATININE 2.16* 2.20*  CALCIUM 8.9 8.8   Lab Results  Component Value Date   CKTOTAL 1156* 12/14/2006   CKMB 15.9* 12/14/2006   TROPONINI 0.43* 04/05/2014    Imaging Studies: Dg Chest Portable 1 View  04/04/2014   CLINICAL DATA:  Cough.  EXAM: PORTABLE CHEST - 1 VIEW  COMPARISON:  05/11/2013  FINDINGS: There is new cardiomegaly with pulmonary vascular congestion and mild interstitial pulmonary edema bilaterally. Previous median sternotomy. No acute osseous abnormality.  IMPRESSION: Congestive heart failure.   Electronically Signed   By: Rozetta Nunnery M.D.   On: 04/04/2014 13:55  [4 week]   Impression: 68 y/o female who presented with worsening SOB in setting of ischemic CM, COPD. Found to have profound anemia, microcytic. Trend downward of hgb since 05/2013 from 12 to 6 over time. No overt GI bleeding noted but she is heme +. She is due for short interval follow up colonoscopy next month for advanced polyp requiring piecemeal removal. Cannot exclude chronic bleeding associated with this. Small bowel etiology remains possibility. Given current CHF/elevated troponin, recent Eliquis (presumed last dose yesterday morning prior to presentation to ER-patient difficult historian) would hold off on invasive procedures at this time. She may require repeat colonoscopy this admission +/- small bowel  capsule study.   Plan: 1. Transfuse as needed. 2. Await cardiology input. Patient is currently off of Eliquis, since admission. 3. Continue PPI.  4. F/U pending anemia labs. 5. Continue clear liquids for now.  We would like to thank you for the opportunity to participate in the care of [facility].    LOS: 1 day   Neil Crouch  04/05/2014, 7:46 AM  Addendum: Discussed with Dr. Oneida Alar. No plans for invasive tests today. Await cardiology input and time off Eliquis. Plan for Agile capsule today to make sure patient can swallow [place], etc. Then likely pursue colonoscopy followed by capsule if needed. Dr. Oneida Alar to reevaluate patient later today.

## 2014-04-05 NOTE — Progress Notes (Signed)
Subjective: She was admitted yesterday with anemia. She has multiple other medical problems including previous myocardial infarction which was a non-STEMI. She has atrial fibrillation. She has ischemic cardiomyopathy. 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 She is anticoagulated for atrial fibrillation. Chest x-ray was suggestive of congestive heart failure on admission.  Objective: Vital signs in last 24 hours: Temp:  [97 F (36.1 C)-98.5 F (36.9 C)] 97 F (36.1 C) (11/20 0400) Pulse Rate:  [60-81] 71 (11/20 0700) Resp:  [16-40] 18 (11/20 0700) BP: (72-149)/(39-101) 121/58 mmHg (11/20 0700) SpO2:  [89 %-99 %] 97 % (11/20 0801) FiO2 (%):  [24 %] 24 % (11/19 1949) Weight:  [45 kg (99 lb 3.3 oz)-45.9 kg (101 lb 3.1 oz)] 45.9 kg (101 lb 3.1 oz) (11/20 0500) Weight change:  Last BM Date: 04/04/14  Intake/Output from previous day: 11/19 0701 - 11/20 0700 In: 1768.8 [P.O.:1440; I.V.:36.8; Blood:292] Out: 2800 [Urine:2800]  PHYSICAL EXAM General appearance: alert, cooperative, no distress and slowed mentation Resp: rhonchi bilaterally Cardio: regular rate and rhythm, S1, S2 normal, no murmur, click, rub or gallop GI: soft, non-tender; bowel sounds normal; no masses,  no organomegaly Extremities: extremities normal, atraumatic, no cyanosis or edema  Lab Results:  Results for orders placed or performed during the hospital encounter of 04/04/14 (from the past 48 hour(s))  CBC with Differential     Status: Abnormal   Collection Time: 04/04/14 12:51 PM  Result Value Ref Range   WBC 16.5 (H) 4.0 - 10.5 K/uL   RBC 2.91 (L) 3.87 - 5.11 MIL/uL   Hemoglobin 6.2 (LL) 12.0 - 15.0 g/dL    Comment: RESULT REPEATED AND VERIFIED CRITICAL RESULT CALLED TO, READ BACK BY AND VERIFIED WITH: BORTC,C ON 04/04/2014 AT 1335 BY ISLEY,B    HCT 22.7 (L) 36.0 - 46.0 %   MCV 78.0 78.0 - 100.0 fL   MCH 21.3 (L) 26.0 - 34.0 pg   MCHC 27.3 (L) 30.0 - 36.0 g/dL   RDW 19.7 (H) 94.3 - 91.2 %   Platelets 392 150  - 400 K/uL   Neutrophils Relative % 87 (H) 43 - 77 %   Neutro Abs 14.4 (H) 1.7 - 7.7 K/uL   Lymphocytes Relative 4 (L) 12 - 46 %   Lymphs Abs 0.7 0.7 - 4.0 K/uL   Monocytes Relative 8 3 - 12 %   Monocytes Absolute 1.3 (H) 0.1 - 1.0 K/uL   Eosinophils Relative 0 0 - 5 %   Eosinophils Absolute 0.0 0.0 - 0.7 K/uL   Basophils Relative 0 0 - 1 %   Basophils Absolute 0.0 0.0 - 0.1 K/uL   RBC Morphology SCHISTOCYTES NOTED ON SMEAR     Comment: POLYCHROMASIA PRESENT  Basic metabolic panel     Status: Abnormal   Collection Time: 04/04/14 12:51 PM  Result Value Ref Range   Sodium 139 137 - 147 mEq/L   Potassium 5.3 3.7 - 5.3 mEq/L   Chloride 103 96 - 112 mEq/L   CO2 23 19 - 32 mEq/L   Glucose, Bld 111 (H) 70 - 99 mg/dL   BUN 25 (H) 6 - 23 mg/dL   Creatinine, Ser 9.90 (H) 0.50 - 1.10 mg/dL   Calcium 8.9 8.4 - 20.5 mg/dL   GFR calc non Af Amer 22 (L) >90 mL/min   GFR calc Af Amer 26 (L) >90 mL/min    Comment: (NOTE) The eGFR has been calculated using the CKD EPI equation. This calculation has not been validated in all  clinical situations. eGFR's persistently <90 mL/min signify possible Chronic Kidney Disease.    Anion gap 13 5 - 15  Troponin I     Status: None   Collection Time: 04/04/14 12:51 PM  Result Value Ref Range   Troponin I <0.30 <0.30 ng/mL    Comment:        Due to the release kinetics of cTnI, a negative result within the first hours of the onset of symptoms does not rule out myocardial infarction with certainty. If myocardial infarction is still suspected, repeat the test at appropriate intervals.   Pro b natriuretic peptide (BNP)     Status: Abnormal   Collection Time: 04/04/14 12:51 PM  Result Value Ref Range   Pro B Natriuretic peptide (BNP) 7846.0 (H) 0 - 125 pg/mL  Type and screen     Status: None (Preliminary result)   Collection Time: 04/04/14  1:43 PM  Result Value Ref Range   ABO/RH(D) O POS    Antibody Screen NEG    Sample Expiration 04/07/2014     Unit Number [id number]    Blood Component Type RCLI PHER 2    Unit division 00    Status of Unit ISSUED    Transfusion Status OK TO TRANSFUSE    Crossmatch Result Compatible    Unit Number [id number]    Blood Component Type RCLI PHER 1    Unit division 00    Status of Unit ALLOCATED    Transfusion Status OK TO TRANSFUSE    Crossmatch Result Compatible   Prepare RBC     Status: None   Collection Time: 04/04/14  1:53 PM  Result Value Ref Range   Order Confirmation ORDER PROCESSED BY BLOOD BANK   POC occult blood, ED     Status: None   Collection Time: 04/04/14  3:22 PM  Result Value Ref Range   Fecal Occult Bld NEGATIVE NEGATIVE  MRSA PCR Screening     Status: None   Collection Time: 04/04/14  5:25 PM  Result Value Ref Range   MRSA by PCR NEGATIVE NEGATIVE    Comment:        The GeneXpert MRSA Assay (FDA approved for NASAL specimens only), is one component of a comprehensive MRSA colonization surveillance program. It is not intended to diagnose MRSA infection nor to guide or monitor treatment for MRSA infections.   Reticulocytes     Status: Abnormal   Collection Time: 04/04/14  8:28 PM  Result Value Ref Range   Retic Ct Pct 1.8 0.4 - 3.1 %   RBC. 3.48 (L) 3.87 - 5.11 MIL/uL   Retic Count, Manual 62.6 19.0 - 186.0 K/uL  Troponin I     Status: Abnormal   Collection Time: 04/04/14  8:28 PM  Result Value Ref Range   Troponin I 0.41 (HH) <0.30 ng/mL    Comment:        Due to the release kinetics of cTnI, a negative result within the first hours of the onset of symptoms does not rule out myocardial infarction with certainty. If myocardial infarction is still suspected, repeat the test at appropriate intervals. CRITICAL RESULT CALLED TO, READ BACK BY AND VERIFIED WITH: KEITH,A ON 04/04/14 AT 2105 BY LOY,C   CBC     Status: Abnormal   Collection Time: 04/04/14  8:28 PM  Result Value Ref Range   WBC 15.1 (H) 4.0 - 10.5 K/uL   RBC 3.48 (L) 3.87 - 5.11 MIL/uL    Hemoglobin 8.0 (L) 12.0 -  15.0 g/dL    Comment: DELTA CHECK NOTED POST TRANSFUSION SPECIMEN    HCT 27.1 (L) 36.0 - 46.0 %   MCV 77.9 (L) 78.0 - 100.0 fL   MCH 23.0 (L) 26.0 - 34.0 pg   MCHC 29.5 (L) 30.0 - 36.0 g/dL   RDW 96.0 (H) 45.4 - 09.8 %   Platelets 395 150 - 400 K/uL  Basic metabolic panel     Status: Abnormal   Collection Time: 04/05/14  2:18 AM  Result Value Ref Range   Sodium 140 137 - 147 mEq/L   Potassium 4.2 3.7 - 5.3 mEq/L    Comment: DELTA CHECK NOTED   Chloride 100 96 - 112 mEq/L   CO2 25 19 - 32 mEq/L   Glucose, Bld 140 (H) 70 - 99 mg/dL   BUN 24 (H) 6 - 23 mg/dL   Creatinine, Ser 1.19 (H) 0.50 - 1.10 mg/dL   Calcium 8.8 8.4 - 14.7 mg/dL   GFR calc non Af Amer 22 (L) >90 mL/min   GFR calc Af Amer 25 (L) >90 mL/min    Comment: (NOTE) The eGFR has been calculated using the CKD EPI equation. This calculation has not been validated in all clinical situations. eGFR's persistently <90 mL/min signify possible Chronic Kidney Disease.    Anion gap 15 5 - 15  CBC WITH DIFFERENTIAL     Status: Abnormal   Collection Time: 04/05/14  2:18 AM  Result Value Ref Range   WBC 15.5 (H) 4.0 - 10.5 K/uL   RBC 3.50 (L) 3.87 - 5.11 MIL/uL   Hemoglobin 8.0 (L) 12.0 - 15.0 g/dL   HCT 82.9 (L) 56.2 - 13.0 %   MCV 76.6 (L) 78.0 - 100.0 fL   MCH 22.9 (L) 26.0 - 34.0 pg   MCHC 29.9 (L) 30.0 - 36.0 g/dL   RDW 86.5 (H) 78.4 - 69.6 %   Platelets 399 150 - 400 K/uL   Neutrophils Relative % 95 (H) 43 - 77 %   Neutro Abs 14.7 (H) 1.7 - 7.7 K/uL   Lymphocytes Relative 3 (L) 12 - 46 %   Lymphs Abs 0.5 (L) 0.7 - 4.0 K/uL   Monocytes Relative 2 (L) 3 - 12 %   Monocytes Absolute 0.3 0.1 - 1.0 K/uL   Eosinophils Relative 0 0 - 5 %   Eosinophils Absolute 0.0 0.0 - 0.7 K/uL   Basophils Relative 0 0 - 1 %   Basophils Absolute 0.0 0.0 - 0.1 K/uL  Troponin I     Status: Abnormal   Collection Time: 04/05/14  2:18 AM  Result Value Ref Range   Troponin I 0.43 (HH) <0.30 ng/mL    Comment:         Due to the release kinetics of cTnI, a negative result within the first hours of the onset of symptoms does not rule out myocardial infarction with certainty. If myocardial infarction is still suspected, repeat the test at appropriate intervals. CRITICAL VALUE NOTED.  VALUE IS CONSISTENT WITH PREVIOUSLY REPORTED AND CALLED VALUE.     ABGS No results for input(s): PHART, PO2ART, TCO2, HCO3 in the last 72 hours.  Invalid input(s): PCO2 CULTURES Recent Results (from the past 240 hour(s))  MRSA PCR Screening     Status: None   Collection Time: 04/04/14  5:25 PM  Result Value Ref Range Status   MRSA by PCR NEGATIVE NEGATIVE Final    Comment:        The GeneXpert MRSA Assay (FDA  approved for NASAL specimens only), is one component of a comprehensive MRSA colonization surveillance program. It is not intended to diagnose MRSA infection nor to guide or monitor treatment for MRSA infections.    Studies/Results: Dg Chest Portable 1 View  04/04/2014   CLINICAL DATA:  Cough.  EXAM: PORTABLE CHEST - 1 VIEW  COMPARISON:  05/11/2013  FINDINGS: There is new cardiomegaly with pulmonary vascular congestion and mild interstitial pulmonary edema bilaterally. Previous median sternotomy. No acute osseous abnormality.  IMPRESSION: Congestive heart failure.   Electronically Signed   By: Rozetta Nunnery M.D.   On: 04/04/2014 13:55    Medications:  Prior to Admission:  Prescriptions prior to admission  Medication Sig Dispense Refill Last Dose  . acetaminophen (TYLENOL) 325 MG tablet Take 2 tablets (650 mg total) by mouth every 6 (six) hours as needed for mild pain or fever.   04/04/2014  . albuterol (PROVENTIL) (2.5 MG/3ML) 0.083% nebulizer solution Take 2.5 mg by nebulization every 6 (six) hours as needed for wheezing or shortness of breath.   UNKNOWN  . ALPRAZolam (XANAX) 0.25 MG tablet Take 0.25 mg by mouth 3 (three) times daily as needed for anxiety.    04/04/2014  . amiodarone (PACERONE) 200  MG tablet Continue 200 mg twice a day until 05/31/13. Then decrease to 200 mg daily. (Patient taking differently: Take 200 mg by mouth daily. ) 60 tablet 1 04/04/2014  . apixaban (ELIQUIS) 2.5 MG TABS tablet Take 1 tablet (2.5 mg total) by mouth 2 (two) times daily. 60 tablet 0 04/04/2014  . aspirin EC 81 MG EC tablet Take 1 tablet (81 mg total) by mouth daily.   04/04/2014  . atorvastatin (LIPITOR) 80 MG tablet Take 80 mg by mouth daily.   04/04/2014  . HYDROcodone-acetaminophen (NORCO/VICODIN) 5-325 MG per tablet Take 1 tablet by mouth every 4 (four) hours as needed for moderate pain.    Past Week at Unknown time  . ibuprofen (ADVIL,MOTRIN) 200 MG tablet Take 400 mg by mouth every 6 (six) hours as needed for headache.   04/04/2014  . ipratropium (ATROVENT HFA) 17 MCG/ACT inhaler Inhale 1 puff into the lungs every 4 (four) hours as needed for wheezing.    04/04/2014  . ipratropium-albuterol (DUONEB) 0.5-2.5 (3) MG/3ML SOLN Take 3 mLs by nebulization every 6 (six) hours as needed (SHORTNESS OF BREATH).   04/04/2014 at 1100  . pantoprazole (PROTONIX) 40 MG tablet Take 40 mg by mouth daily.   04/04/2014  . predniSONE (DELTASONE) 10 MG tablet Take 10 mg by mouth daily with breakfast.   04/04/2014  . Linaclotide (LINZESS) 145 MCG CAPS capsule Take 1 capsule (145 mcg total) by mouth daily. Take 30 minutes prior to breakfast. (Patient not taking: Reported on 04/04/2014) 30 capsule 5 Unknown at Unknown time  . polyethylene glycol-electrolytes (TRILYTE) 420 G solution Take 4,000 mLs by mouth as directed. (Patient not taking: Reported on 04/04/2014) 4000 mL 0 10/24/2013 at 1800   Scheduled: . amiodarone  200 mg Oral Daily  . antiseptic oral rinse  7 mL Mouth Rinse BID  . atorvastatin  80 mg Oral q1800  . furosemide  40 mg Intravenous BID  . guaiFENesin  1,200 mg Oral BID  . ipratropium-albuterol  3 mL Nebulization Q4H  . methylPREDNISolone (SOLU-MEDROL) injection  60 mg Intravenous Q12H  . pantoprazole  (PROTONIX) IV  40 mg Intravenous Q12H  . sodium chloride  3 mL Intravenous Q12H   Continuous:  RZN:BVAPOL chloride, acetaminophen, ALPRAZolam, HYDROcodone-acetaminophen, ondansetron (ZOFRAN) IV,  sodium chloride  Assesment: She was admitted with anemia and heme positive stool. She is chronically anticoagulated. She has been in atrial fibrillation and has a history of heart failure from ischemic cardiomyopathy. Chest x-ray suggests acute systolic CHF. At baseline she has pretty severe COPD. She has mild mental retardation. Active Problems:   COPD (chronic obstructive pulmonary disease)   Tobacco abuse   Acute respiratory failure   Acute systolic CHF (congestive heart failure)   CKD (chronic kidney disease), stage III   Atrial fibrillation   Anemia   Chronic anticoagulation   GI bleed    Plan: Continue current treatments. GI and cardiology consultations have been requested    LOS: 1 day   Aarron Wierzbicki L 04/05/2014, 8:17 AM

## 2014-04-06 ENCOUNTER — Inpatient Hospital Stay (HOSPITAL_COMMUNITY): Payer: PRIVATE HEALTH INSURANCE

## 2014-04-06 DIAGNOSIS — R195 Other fecal abnormalities: Secondary | ICD-10-CM

## 2014-04-06 DIAGNOSIS — D649 Anemia, unspecified: Secondary | ICD-10-CM

## 2014-04-06 DIAGNOSIS — R05 Cough: Secondary | ICD-10-CM

## 2014-04-06 LAB — TYPE AND SCREEN
ABO/RH(D): O POS
Antibody Screen: NEGATIVE
UNIT DIVISION: 0
Unit division: 0

## 2014-04-06 LAB — CBC WITH DIFFERENTIAL/PLATELET
BASOS PCT: 0 % (ref 0–1)
Basophils Absolute: 0 10*3/uL (ref 0.0–0.1)
Eosinophils Absolute: 0 10*3/uL (ref 0.0–0.7)
Eosinophils Relative: 0 % (ref 0–5)
HCT: 31.4 % — ABNORMAL LOW (ref 36.0–46.0)
HEMOGLOBIN: 9.5 g/dL — AB (ref 12.0–15.0)
LYMPHS ABS: 0.4 10*3/uL — AB (ref 0.7–4.0)
Lymphocytes Relative: 2 % — ABNORMAL LOW (ref 12–46)
MCH: 23.7 pg — ABNORMAL LOW (ref 26.0–34.0)
MCHC: 30.3 g/dL (ref 30.0–36.0)
MCV: 78.3 fL (ref 78.0–100.0)
MONOS PCT: 3 % (ref 3–12)
Monocytes Absolute: 0.6 10*3/uL (ref 0.1–1.0)
NEUTROS ABS: 17.9 10*3/uL — AB (ref 1.7–7.7)
Neutrophils Relative %: 95 % — ABNORMAL HIGH (ref 43–77)
PLATELETS: 374 10*3/uL (ref 150–400)
RBC: 4.01 MIL/uL (ref 3.87–5.11)
RDW: 18 % — ABNORMAL HIGH (ref 11.5–15.5)
WBC: 18.9 10*3/uL — AB (ref 4.0–10.5)

## 2014-04-06 LAB — COMPREHENSIVE METABOLIC PANEL
ALBUMIN: 3 g/dL — AB (ref 3.5–5.2)
ALK PHOS: 86 U/L (ref 39–117)
ALT: 9 U/L (ref 0–35)
ANION GAP: 16 — AB (ref 5–15)
AST: 14 U/L (ref 0–37)
BILIRUBIN TOTAL: 0.6 mg/dL (ref 0.3–1.2)
BUN: 37 mg/dL — AB (ref 6–23)
CHLORIDE: 96 meq/L (ref 96–112)
CO2: 27 mEq/L (ref 19–32)
Calcium: 9.1 mg/dL (ref 8.4–10.5)
Creatinine, Ser: 2.29 mg/dL — ABNORMAL HIGH (ref 0.50–1.10)
GFR calc Af Amer: 24 mL/min — ABNORMAL LOW (ref >90)
GFR calc non Af Amer: 21 mL/min — ABNORMAL LOW (ref >90)
GLUCOSE: 156 mg/dL — AB (ref 70–99)
Potassium: 4.6 mEq/L (ref 3.7–5.3)
Sodium: 139 mEq/L (ref 137–147)
TOTAL PROTEIN: 6.3 g/dL (ref 6.0–8.3)

## 2014-04-06 NOTE — Plan of Care (Signed)
Problem: Phase I Progression Outcomes Goal: Pain controlled with appropriate interventions Outcome: Completed/Met Date Met:  04/06/14     

## 2014-04-06 NOTE — Progress Notes (Signed)
Pharmacist Heart Failure Core Measure Documentation  Assessment: Alexis Dillon has an EF documented as 30-35% on 04/05/2014 by ECHO.  Rationale: Heart failure patients with left ventricular systolic dysfunction (LVSD) and an EF < 40% should be prescribed an angiotensin converting enzyme inhibitor (ACEI) or angiotensin receptor blocker (ARB) at discharge unless a contraindication is documented in the medical record.  This patient is not currently on an ACEI or ARB for HF.  This note is being placed in the record in order to provide documentation that a contraindication to the use of these agents is present for this encounter.  ACE Inhibitor or Angiotensin Receptor Blocker is contraindicated (specify all that apply)  []   ACEI allergy AND ARB allergy []   Angioedema []   Moderate or severe aortic stenosis []   Hyperkalemia []   Hypotension []   Renal artery stenosis [x]   Worsening renal function, preexisting renal disease or dysfunction   Pricilla Larsson 04/06/2014 1:41 PM

## 2014-04-06 NOTE — Progress Notes (Signed)
Subjective: She says she feels better. She is not short of breath now. She has not had any abdominal pain or bleeding. The help from consultants is noted and appreciated  Objective: Vital signs in last 24 hours: Temp:  [97.1 F (36.2 C)-98.6 F (37 C)] 98.6 F (37 C) (11/21 0527) Pulse Rate:  [70-104] 70 (11/21 0527) Resp:  [15-21] 18 (11/21 0527) BP: (81-115)/(50-78) 81/56 mmHg (11/21 0527) SpO2:  [85 %-99 %] 92 % (11/21 0813) Weight:  [47.038 kg (103 lb 11.2 oz)] 47.038 kg (103 lb 11.2 oz) (11/21 0527) Weight change: 1.678 kg (3 lb 11.2 oz) Last BM Date: 04/04/14  Intake/Output from previous day: 11/20 0701 - 11/21 0700 In: 1365 [P.O.:1040; Blood:325] Out: 725 [Urine:725]  PHYSICAL EXAM General appearance: alert, cooperative and no distress Resp: clear to auscultation bilaterally Cardio: irregularly irregular rhythm GI: soft, non-tender; bowel sounds normal; no masses,  no organomegaly Extremities: extremities normal, atraumatic, no cyanosis or edema  Lab Results:  Results for orders placed or performed during the hospital encounter of 04/04/14 (from the past 48 hour(s))  CBC with Differential     Status: Abnormal   Collection Time: 04/04/14 12:51 PM  Result Value Ref Range   WBC 16.5 (H) 4.0 - 10.5 K/uL   RBC 2.91 (L) 3.87 - 5.11 MIL/uL   Hemoglobin 6.2 (LL) 12.0 - 15.0 g/dL    Comment: RESULT REPEATED AND VERIFIED CRITICAL RESULT CALLED TO, READ BACK BY AND VERIFIED WITH: BORTC,C ON 04/04/2014 AT 1335 BY ISLEY,B    HCT 22.7 (L) 36.0 - 46.0 %   MCV 78.0 78.0 - 100.0 fL   MCH 21.3 (L) 26.0 - 34.0 pg   MCHC 27.3 (L) 30.0 - 36.0 g/dL   RDW 19.1 (H) 11.5 - 15.5 %   Platelets 392 150 - 400 K/uL   Neutrophils Relative % 87 (H) 43 - 77 %   Neutro Abs 14.4 (H) 1.7 - 7.7 K/uL   Lymphocytes Relative 4 (L) 12 - 46 %   Lymphs Abs 0.7 0.7 - 4.0 K/uL   Monocytes Relative 8 3 - 12 %   Monocytes Absolute 1.3 (H) 0.1 - 1.0 K/uL   Eosinophils Relative 0 0 - 5 %   Eosinophils  Absolute 0.0 0.0 - 0.7 K/uL   Basophils Relative 0 0 - 1 %   Basophils Absolute 0.0 0.0 - 0.1 K/uL   RBC Morphology SCHISTOCYTES NOTED ON SMEAR     Comment: POLYCHROMASIA PRESENT  Basic metabolic panel     Status: Abnormal   Collection Time: 04/04/14 12:51 PM  Result Value Ref Range   Sodium 139 137 - 147 mEq/L   Potassium 5.3 3.7 - 5.3 mEq/L   Chloride 103 96 - 112 mEq/L   CO2 23 19 - 32 mEq/L   Glucose, Bld 111 (H) 70 - 99 mg/dL   BUN 25 (H) 6 - 23 mg/dL   Creatinine, Ser 2.16 (H) 0.50 - 1.10 mg/dL   Calcium 8.9 8.4 - 10.5 mg/dL   GFR calc non Af Amer 22 (L) >90 mL/min   GFR calc Af Amer 26 (L) >90 mL/min    Comment: (NOTE) The eGFR has been calculated using the CKD EPI equation. This calculation has not been validated in all clinical situations. eGFR's persistently <90 mL/min signify possible Chronic Kidney Disease.    Anion gap 13 5 - 15  Troponin I     Status: None   Collection Time: 04/04/14 12:51 PM  Result Value Ref Range  Troponin I <0.30 <0.30 ng/mL    Comment:        Due to the release kinetics of cTnI, a negative result within the first hours of the onset of symptoms does not rule out myocardial infarction with certainty. If myocardial infarction is still suspected, repeat the test at appropriate intervals.   Pro b natriuretic peptide (BNP)     Status: Abnormal   Collection Time: 04/04/14 12:51 PM  Result Value Ref Range   Pro B Natriuretic peptide (BNP) 7846.0 (H) 0 - 125 pg/mL  Type and screen     Status: None   Collection Time: 04/04/14  1:43 PM  Result Value Ref Range   ABO/RH(D) O POS    Antibody Screen NEG    Sample Expiration 04/07/2014    Unit Number [id number]    Blood Component Type RCLI PHER 2    Unit division 00    Status of Unit ISSUED,FINAL    Transfusion Status OK TO TRANSFUSE    Crossmatch Result Compatible    Unit Number [id number]    Blood Component Type RCLI PHER 1    Unit division 00    Status of Unit ISSUED,FINAL     Transfusion Status OK TO TRANSFUSE    Crossmatch Result Compatible   Prepare RBC     Status: None   Collection Time: 04/04/14  1:53 PM  Result Value Ref Range   Order Confirmation ORDER PROCESSED BY BLOOD BANK   POC occult blood, ED     Status: None   Collection Time: 04/04/14  3:22 PM  Result Value Ref Range   Fecal Occult Bld NEGATIVE NEGATIVE  MRSA PCR Screening     Status: None   Collection Time: 04/04/14  5:25 PM  Result Value Ref Range   MRSA by PCR NEGATIVE NEGATIVE    Comment:        The GeneXpert MRSA Assay (FDA approved for NASAL specimens only), is one component of a comprehensive MRSA colonization surveillance program. It is not intended to diagnose MRSA infection nor to guide or monitor treatment for MRSA infections.   TSH     Status: None   Collection Time: 04/04/14  7:52 PM  Result Value Ref Range   TSH 3.990 0.350 - 4.500 uIU/mL    Comment: Performed at [facility]  Reticulocytes     Status: Abnormal   Collection Time: 04/04/14  8:28 PM  Result Value Ref Range   Retic Ct Pct 1.8 0.4 - 3.1 %   RBC. 3.48 (L) 3.87 - 5.11 MIL/uL   Retic Count, Manual 62.6 19.0 - 186.0 K/uL  Troponin I     Status: Abnormal   Collection Time: 04/04/14  8:28 PM  Result Value Ref Range   Troponin I 0.41 (HH) <0.30 ng/mL    Comment:        Due to the release kinetics of cTnI, a negative result within the first hours of the onset of symptoms does not rule out myocardial infarction with certainty. If myocardial infarction is still suspected, repeat the test at appropriate intervals. CRITICAL RESULT CALLED TO, READ BACK BY AND VERIFIED WITH: KEITH,A ON 04/04/14 AT 2105 BY LOY,C   CBC     Status: Abnormal   Collection Time: 04/04/14  8:28 PM  Result Value Ref Range   WBC 15.1 (H) 4.0 - 10.5 K/uL   RBC 3.48 (L) 3.87 - 5.11 MIL/uL   Hemoglobin 8.0 (L) 12.0 - 15.0 g/dL    Comment: DELTA  CHECK NOTED POST TRANSFUSION SPECIMEN    HCT 27.1 (L) 36.0 - 46.0 %   MCV 77.9  (L) 78.0 - 100.0 fL   MCH 23.0 (L) 26.0 - 34.0 pg   MCHC 29.5 (L) 30.0 - 36.0 g/dL   RDW 18.1 (H) 11.5 - 15.5 %   Platelets 395 150 - 400 K/uL  Basic metabolic panel     Status: Abnormal   Collection Time: 04/05/14  2:18 AM  Result Value Ref Range   Sodium 140 137 - 147 mEq/L   Potassium 4.2 3.7 - 5.3 mEq/L    Comment: DELTA CHECK NOTED   Chloride 100 96 - 112 mEq/L   CO2 25 19 - 32 mEq/L   Glucose, Bld 140 (H) 70 - 99 mg/dL   BUN 24 (H) 6 - 23 mg/dL   Creatinine, Ser 2.20 (H) 0.50 - 1.10 mg/dL   Calcium 8.8 8.4 - 10.5 mg/dL   GFR calc non Af Amer 22 (L) >90 mL/min   GFR calc Af Amer 25 (L) >90 mL/min    Comment: (NOTE) The eGFR has been calculated using the CKD EPI equation. This calculation has not been validated in all clinical situations. eGFR's persistently <90 mL/min signify possible Chronic Kidney Disease.    Anion gap 15 5 - 15  CBC WITH DIFFERENTIAL     Status: Abnormal   Collection Time: 04/05/14  2:18 AM  Result Value Ref Range   WBC 15.5 (H) 4.0 - 10.5 K/uL   RBC 3.50 (L) 3.87 - 5.11 MIL/uL   Hemoglobin 8.0 (L) 12.0 - 15.0 g/dL   HCT 26.8 (L) 36.0 - 46.0 %   MCV 76.6 (L) 78.0 - 100.0 fL   MCH 22.9 (L) 26.0 - 34.0 pg   MCHC 29.9 (L) 30.0 - 36.0 g/dL   RDW 17.9 (H) 11.5 - 15.5 %   Platelets 399 150 - 400 K/uL   Neutrophils Relative % 95 (H) 43 - 77 %   Neutro Abs 14.7 (H) 1.7 - 7.7 K/uL   Lymphocytes Relative 3 (L) 12 - 46 %   Lymphs Abs 0.5 (L) 0.7 - 4.0 K/uL   Monocytes Relative 2 (L) 3 - 12 %   Monocytes Absolute 0.3 0.1 - 1.0 K/uL   Eosinophils Relative 0 0 - 5 %   Eosinophils Absolute 0.0 0.0 - 0.7 K/uL   Basophils Relative 0 0 - 1 %   Basophils Absolute 0.0 0.0 - 0.1 K/uL  Troponin I     Status: Abnormal   Collection Time: 04/05/14  2:18 AM  Result Value Ref Range   Troponin I 0.43 (HH) <0.30 ng/mL    Comment:        Due to the release kinetics of cTnI, a negative result within the first hours of the onset of symptoms does not rule  out myocardial infarction with certainty. If myocardial infarction is still suspected, repeat the test at appropriate intervals. CRITICAL VALUE NOTED.  VALUE IS CONSISTENT WITH PREVIOUSLY REPORTED AND CALLED VALUE.   Vitamin B12     Status: None   Collection Time: 04/05/14  2:18 AM  Result Value Ref Range   Vitamin B-12 456 211 - 911 pg/mL    Comment: Performed at [facility]  Iron and TIBC     Status: None   Collection Time: 04/05/14  2:18 AM  Result Value Ref Range   Iron 99 42 - 135 ug/dL   TIBC 390 250 - 470 ug/dL   Saturation Ratios  25 20 - 55 %   UIBC 291 125 - 400 ug/dL    Comment: Performed at [facility]  Ferritin     Status: None   Collection Time: 04/05/14  2:18 AM  Result Value Ref Range   Ferritin 18 10 - 291 ng/mL    Comment: Performed at [facility]  Folate     Status: None   Collection Time: 04/05/14  2:18 AM  Result Value Ref Range   Folate >20.0 ng/mL    Comment: (NOTE) Reference Ranges        Deficient:       0.4 - 3.3 ng/mL        Indeterminate:   3.4 - 5.4 ng/mL        Normal:              > 5.4 ng/mL Performed at [facility]   Troponin I     Status: None   Collection Time: 04/05/14  7:53 AM  Result Value Ref Range   Troponin I <0.30 <0.30 ng/mL    Comment:        Due to the release kinetics of cTnI, a negative result within the first hours of the onset of symptoms does not rule out myocardial infarction with certainty. If myocardial infarction is still suspected, repeat the test at appropriate intervals.   Urine rapid drug screen (hosp performed)     Status: None   Collection Time: 04/05/14 10:11 AM  Result Value Ref Range   Opiates NONE DETECTED NONE DETECTED   Cocaine NONE DETECTED NONE DETECTED   Benzodiazepines NONE DETECTED NONE DETECTED   Amphetamines NONE DETECTED NONE DETECTED   Tetrahydrocannabinol NONE DETECTED NONE DETECTED   Barbiturates NONE DETECTED NONE DETECTED    Comment:        DRUG  SCREEN FOR MEDICAL PURPOSES ONLY.  IF CONFIRMATION IS NEEDED FOR ANY PURPOSE, NOTIFY LAB WITHIN 5 DAYS.        LOWEST DETECTABLE LIMITS FOR URINE DRUG SCREEN Drug Class       Cutoff (ng/mL) Amphetamine      1000 Barbiturate      200 Benzodiazepine   889 Tricyclics       169 Opiates          300 Cocaine          300 THC              50   CBC with Differential     Status: Abnormal   Collection Time: 04/06/14  6:20 AM  Result Value Ref Range   WBC 18.9 (H) 4.0 - 10.5 K/uL   RBC 4.01 3.87 - 5.11 MIL/uL   Hemoglobin 9.5 (L) 12.0 - 15.0 g/dL   HCT 31.4 (L) 36.0 - 46.0 %   MCV 78.3 78.0 - 100.0 fL   MCH 23.7 (L) 26.0 - 34.0 pg   MCHC 30.3 30.0 - 36.0 g/dL   RDW 18.0 (H) 11.5 - 15.5 %   Platelets 374 150 - 400 K/uL   Neutrophils Relative % 95 (H) 43 - 77 %   Neutro Abs 17.9 (H) 1.7 - 7.7 K/uL   Lymphocytes Relative 2 (L) 12 - 46 %   Lymphs Abs 0.4 (L) 0.7 - 4.0 K/uL   Monocytes Relative 3 3 - 12 %   Monocytes Absolute 0.6 0.1 - 1.0 K/uL   Eosinophils Relative 0 0 - 5 %   Eosinophils Absolute 0.0 0.0 - 0.7 K/uL  Basophils Relative 0 0 - 1 %   Basophils Absolute 0.0 0.0 - 0.1 K/uL  Comprehensive metabolic panel     Status: Abnormal   Collection Time: 04/06/14  6:20 AM  Result Value Ref Range   Sodium 139 137 - 147 mEq/L   Potassium 4.6 3.7 - 5.3 mEq/L   Chloride 96 96 - 112 mEq/L   CO2 27 19 - 32 mEq/L   Glucose, Bld 156 (H) 70 - 99 mg/dL   BUN 37 (H) 6 - 23 mg/dL    Comment: DELTA CHECK NOTED   Creatinine, Ser 2.29 (H) 0.50 - 1.10 mg/dL   Calcium 9.1 8.4 - 10.5 mg/dL   Total Protein 6.3 6.0 - 8.3 g/dL   Albumin 3.0 (L) 3.5 - 5.2 g/dL   AST 14 0 - 37 U/L   ALT 9 0 - 35 U/L   Alkaline Phosphatase 86 39 - 117 U/L   Total Bilirubin 0.6 0.3 - 1.2 mg/dL   GFR calc non Af Amer 21 (L) >90 mL/min   GFR calc Af Amer 24 (L) >90 mL/min    Comment: (NOTE) The eGFR has been calculated using the CKD EPI equation. This calculation has not been validated in all clinical  situations. eGFR's persistently <90 mL/min signify possible Chronic Kidney Disease.    Anion gap 16 (H) 5 - 15    ABGS No results for input(s): PHART, PO2ART, TCO2, HCO3 in the last 72 hours.  Invalid input(s): PCO2 CULTURES Recent Results (from the past 240 hour(s))  MRSA PCR Screening     Status: None   Collection Time: 04/04/14  5:25 PM  Result Value Ref Range Status   MRSA by PCR NEGATIVE NEGATIVE Final    Comment:        The GeneXpert MRSA Assay (FDA approved for NASAL specimens only), is one component of a comprehensive MRSA colonization surveillance program. It is not intended to diagnose MRSA infection nor to guide or monitor treatment for MRSA infections.    Studies/Results: Dg Chest Portable 1 View  04/04/2014   CLINICAL DATA:  Cough.  EXAM: PORTABLE CHEST - 1 VIEW  COMPARISON:  05/11/2013  FINDINGS: There is new cardiomegaly with pulmonary vascular congestion and mild interstitial pulmonary edema bilaterally. Previous median sternotomy. No acute osseous abnormality.  IMPRESSION: Congestive heart failure.   Electronically Signed   By: Rozetta Nunnery M.D.   On: 04/04/2014 13:55    Medications:  Prior to Admission:  Prescriptions prior to admission  Medication Sig Dispense Refill Last Dose  . acetaminophen (TYLENOL) 325 MG tablet Take 2 tablets (650 mg total) by mouth every 6 (six) hours as needed for mild pain or fever.   04/04/2014  . albuterol (PROVENTIL) (2.5 MG/3ML) 0.083% nebulizer solution Take 2.5 mg by nebulization every 6 (six) hours as needed for wheezing or shortness of breath.   UNKNOWN  . ALPRAZolam (XANAX) 0.25 MG tablet Take 0.25 mg by mouth 3 (three) times daily as needed for anxiety.    04/04/2014  . amiodarone (PACERONE) 200 MG tablet Continue 200 mg twice a day until 05/31/13. Then decrease to 200 mg daily. (Patient taking differently: Take 200 mg by mouth daily. ) 60 tablet 1 04/04/2014  . apixaban (ELIQUIS) 2.5 MG TABS tablet Take 1 tablet (2.5 mg  total) by mouth 2 (two) times daily. 60 tablet 0 04/04/2014  . aspirin EC 81 MG EC tablet Take 1 tablet (81 mg total) by mouth daily.   04/04/2014  . atorvastatin (LIPITOR)  80 MG tablet Take 80 mg by mouth daily.   04/04/2014  . HYDROcodone-acetaminophen (NORCO/VICODIN) 5-325 MG per tablet Take 1 tablet by mouth every 4 (four) hours as needed for moderate pain.    Past Week at Unknown time  . ibuprofen (ADVIL,MOTRIN) 200 MG tablet Take 400 mg by mouth every 6 (six) hours as needed for headache.   04/04/2014  . ipratropium (ATROVENT HFA) 17 MCG/ACT inhaler Inhale 1 puff into the lungs every 4 (four) hours as needed for wheezing.    04/04/2014  . ipratropium-albuterol (DUONEB) 0.5-2.5 (3) MG/3ML SOLN Take 3 mLs by nebulization every 6 (six) hours as needed (SHORTNESS OF BREATH).   04/04/2014 at 1100  . pantoprazole (PROTONIX) 40 MG tablet Take 40 mg by mouth daily.   04/04/2014  . predniSONE (DELTASONE) 10 MG tablet Take 10 mg by mouth daily with breakfast.   04/04/2014  . Linaclotide (LINZESS) 145 MCG CAPS capsule Take 1 capsule (145 mcg total) by mouth daily. Take 30 minutes prior to breakfast. (Patient not taking: Reported on 04/04/2014) 30 capsule 5 Unknown at Unknown time  . polyethylene glycol-electrolytes (TRILYTE) 420 G solution Take 4,000 mLs by mouth as directed. (Patient not taking: Reported on 04/04/2014) 4000 mL 0 10/24/2013 at 1800   Scheduled: . amiodarone  200 mg Oral Daily  . antiseptic oral rinse  7 mL Mouth Rinse BID  . atorvastatin  80 mg Oral q1800  . furosemide  40 mg Intravenous BID  . guaiFENesin  1,200 mg Oral BID  . ipratropium-albuterol  3 mL Nebulization Q4H  . methylPREDNISolone (SOLU-MEDROL) injection  60 mg Intravenous Q12H  . pantoprazole  40 mg Oral BID AC  . sodium chloride  3 mL Intravenous Q12H   Continuous:  ZSW:FUXNAT chloride, acetaminophen, ALPRAZolam, HYDROcodone-acetaminophen, ondansetron (ZOFRAN) IV, sodium chloride  Assesment: She was admitted with  anemia with heme positive stool. She has received 3 units of packed red blood cells and her hemoglobin level is 9.5 now. She developed acute on chronic systolic heart failure which is improved. She had elevated troponin. She is overall much improved now. Active Problems:   LOW BACK PAIN   CHF (congestive heart failure)   COPD (chronic obstructive pulmonary disease)   Tobacco abuse   Hypertension   Mental retardation, idiopathic mild   Acute respiratory failure   Acute systolic CHF (congestive heart failure)   CKD (chronic kidney disease), stage III   Atrial fibrillation   Cardiomyopathy, ischemic   Anemia   Chronic anticoagulation   GI bleed   Heme positive stool   Symptomatic anemia   Acute on chronic systolic congestive heart failure   IDA (iron deficiency anemia)   CKD (chronic kidney disease) stage 4, GFR 15-29 ml/min   Severe left ventricular systolic dysfunction   Hyperlipidemia   Other emphysema    Plan: No change in treatments continue medications recheck labs tomorrow.    LOS: 2 days   Dantre Yearwood L 04/06/2014, 11:58 AM

## 2014-04-06 NOTE — Progress Notes (Signed)
  Subjective:  Patient states she feels better. She has good appetite. She denies shortness of breath. She is coughing up yellow sputum. She denies melena or rectal bleeding.   Objective: Blood pressure 81/56, pulse 70, temperature 98.6 F (37 C), temperature source Oral, resp. rate 18, height 5\' 5"  (1.651 m), weight 103 lb 11.2 oz (47.038 kg), SpO2 87 %.  Cardiac exam with regular rhythm normal S1 and S2. No murmur or gallop noted. Lungs are clear to auscultation. Patient is alert and in no acute distress. Abdomen is soft and nontender without organomegaly or masses. No LE edema or clubbing noted.  Labs/studies Results:   Recent Labs  04/04/14 2028 04/05/14 0218 04/06/14 0620  WBC 15.1* 15.5* 18.9*  HGB 8.0* 8.0* 9.5*  HCT 27.1* 26.8* 31.4*  PLT 395 399 374    BMET   Recent Labs  04/04/14 1251 04/05/14 0218 04/06/14 0620  NA 139 140 139  K 5.3 4.2 4.6  CL 103 100 96  CO2 23 25 27   GLUCOSE 111* 140* 156*  BUN 25* 24* 37*  CREATININE 2.16* 2.20* 2.29*  CALCIUM 8.9 8.8 9.1    LFT   Recent Labs  04/06/14 0620  PROT 6.3  ALBUMIN 3.0*  AST 14  ALT 9  ALKPHOS 86  BILITOT 0.6     Assessment:  #1. Anemia. Stool heme positive on admission but no evidence of overt GI bleed. Suspect anemia is multifactorial including GI blood loss. She had piecemeal polypectomy of complex polyp from splenic flexure by Dr. Gala Romney in June 2015 and will need follow-up colonoscopy as recommended by him. Will hold off endoscopic evaluation while she is undergoing therapy for exacerbation of COPD unless she has active bleeding. #2. Ischemic cardiomyopathy. Patient does not appear to be in CHF.   Recommendations;  CBC in a.m.

## 2014-04-07 LAB — BASIC METABOLIC PANEL
Anion gap: 18 — ABNORMAL HIGH (ref 5–15)
BUN: 47 mg/dL — AB (ref 6–23)
CALCIUM: 9.1 mg/dL (ref 8.4–10.5)
CO2: 26 mEq/L (ref 19–32)
CREATININE: 2.26 mg/dL — AB (ref 0.50–1.10)
Chloride: 92 mEq/L — ABNORMAL LOW (ref 96–112)
GFR calc non Af Amer: 21 mL/min — ABNORMAL LOW (ref >90)
GFR, EST AFRICAN AMERICAN: 24 mL/min — AB (ref >90)
GLUCOSE: 143 mg/dL — AB (ref 70–99)
POTASSIUM: 4.6 meq/L (ref 3.7–5.3)
Sodium: 136 mEq/L — ABNORMAL LOW (ref 137–147)

## 2014-04-07 LAB — CBC
HEMATOCRIT: 33.6 % — AB (ref 36.0–46.0)
HEMOGLOBIN: 10.1 g/dL — AB (ref 12.0–15.0)
MCH: 23.7 pg — AB (ref 26.0–34.0)
MCHC: 30.1 g/dL (ref 30.0–36.0)
MCV: 78.7 fL (ref 78.0–100.0)
Platelets: 382 10*3/uL (ref 150–400)
RBC: 4.27 MIL/uL (ref 3.87–5.11)
RDW: 19.4 % — AB (ref 11.5–15.5)
WBC: 17.1 10*3/uL — ABNORMAL HIGH (ref 4.0–10.5)

## 2014-04-07 NOTE — Progress Notes (Signed)
Subjective: She says she feels bad. She has no new complaints but just generally doesn't feel well. He has no other new complaints. Apparently she's not had vomiting or diarrhea but it's hard to get her to focus enough to tell me for sure. She is not short of breath. She's not having any chest pain.  Objective: Vital signs in last 24 hours: Temp:  [98 F (36.7 C)] 98 F (36.7 C) (11/22 0518) Pulse Rate:  [64-79] 64 (11/22 0518) Resp:  [18-19] 18 (11/22 0518) BP: (92-113)/(54-66) 113/66 mmHg (11/22 0518) SpO2:  [87 %-97 %] 89 % (11/22 0738) Weight:  [47.854 kg (105 lb 8 oz)] 47.854 kg (105 lb 8 oz) (11/22 0518) Weight change: 0.816 kg (1 lb 12.8 oz) Last BM Date: 04/04/14  Intake/Output from previous day: 11/21 0701 - 11/22 0700 In: 2880 [P.O.:2880] Out: 1100 [Urine:1100]  PHYSICAL EXAM General appearance: alert, cooperative and mild distress Resp: clear to auscultation bilaterally Cardio: regular rate and rhythm, S1, S2 normal, no murmur, click, rub or gallop GI: Mildly tender diffusely Extremities: extremities normal, atraumatic, no cyanosis or edema  Lab Results:  Results for orders placed or performed during the hospital encounter of 04/04/14 (from the past 48 hour(s))  Urine rapid drug screen (hosp performed)     Status: None   Collection Time: 04/05/14 10:11 AM  Result Value Ref Range   Opiates NONE DETECTED NONE DETECTED   Cocaine NONE DETECTED NONE DETECTED   Benzodiazepines NONE DETECTED NONE DETECTED   Amphetamines NONE DETECTED NONE DETECTED   Tetrahydrocannabinol NONE DETECTED NONE DETECTED   Barbiturates NONE DETECTED NONE DETECTED    Comment:        DRUG SCREEN FOR MEDICAL PURPOSES ONLY.  IF CONFIRMATION IS NEEDED FOR ANY PURPOSE, NOTIFY LAB WITHIN 5 DAYS.        LOWEST DETECTABLE LIMITS FOR URINE DRUG SCREEN Drug Class       Cutoff (ng/mL) Amphetamine      1000 Barbiturate      200 Benzodiazepine   737 Tricyclics       106 Opiates           300 Cocaine          300 THC              50   CBC with Differential     Status: Abnormal   Collection Time: 04/06/14  6:20 AM  Result Value Ref Range   WBC 18.9 (H) 4.0 - 10.5 K/uL   RBC 4.01 3.87 - 5.11 MIL/uL   Hemoglobin 9.5 (L) 12.0 - 15.0 g/dL   HCT 31.4 (L) 36.0 - 46.0 %   MCV 78.3 78.0 - 100.0 fL   MCH 23.7 (L) 26.0 - 34.0 pg   MCHC 30.3 30.0 - 36.0 g/dL   RDW 18.0 (H) 11.5 - 15.5 %   Platelets 374 150 - 400 K/uL   Neutrophils Relative % 95 (H) 43 - 77 %   Neutro Abs 17.9 (H) 1.7 - 7.7 K/uL   Lymphocytes Relative 2 (L) 12 - 46 %   Lymphs Abs 0.4 (L) 0.7 - 4.0 K/uL   Monocytes Relative 3 3 - 12 %   Monocytes Absolute 0.6 0.1 - 1.0 K/uL   Eosinophils Relative 0 0 - 5 %   Eosinophils Absolute 0.0 0.0 - 0.7 K/uL   Basophils Relative 0 0 - 1 %   Basophils Absolute 0.0 0.0 - 0.1 K/uL  Comprehensive metabolic panel     Status:  Abnormal   Collection Time: 04/06/14  6:20 AM  Result Value Ref Range   Sodium 139 137 - 147 mEq/L   Potassium 4.6 3.7 - 5.3 mEq/L   Chloride 96 96 - 112 mEq/L   CO2 27 19 - 32 mEq/L   Glucose, Bld 156 (H) 70 - 99 mg/dL   BUN 37 (H) 6 - 23 mg/dL    Comment: DELTA CHECK NOTED   Creatinine, Ser 2.29 (H) 0.50 - 1.10 mg/dL   Calcium 9.1 8.4 - 40.1 mg/dL   Total Protein 6.3 6.0 - 8.3 g/dL   Albumin 3.0 (L) 3.5 - 5.2 g/dL   AST 14 0 - 37 U/L   ALT 9 0 - 35 U/L   Alkaline Phosphatase 86 39 - 117 U/L   Total Bilirubin 0.6 0.3 - 1.2 mg/dL   GFR calc non Af Amer 21 (L) >90 mL/min   GFR calc Af Amer 24 (L) >90 mL/min    Comment: (NOTE) The eGFR has been calculated using the CKD EPI equation. This calculation has not been validated in all clinical situations. eGFR's persistently <90 mL/min signify possible Chronic Kidney Disease.    Anion gap 16 (H) 5 - 15  CBC     Status: Abnormal   Collection Time: 04/07/14  5:55 AM  Result Value Ref Range   WBC 17.1 (H) 4.0 - 10.5 K/uL   RBC 4.27 3.87 - 5.11 MIL/uL   Hemoglobin 10.1 (L) 12.0 - 15.0 g/dL   HCT  02.7 (L) 25.3 - 46.0 %   MCV 78.7 78.0 - 100.0 fL   MCH 23.7 (L) 26.0 - 34.0 pg   MCHC 30.1 30.0 - 36.0 g/dL   RDW 66.4 (H) 40.3 - 47.4 %   Platelets 382 150 - 400 K/uL  Basic metabolic panel     Status: Abnormal   Collection Time: 04/07/14  5:55 AM  Result Value Ref Range   Sodium 136 (L) 137 - 147 mEq/L   Potassium 4.6 3.7 - 5.3 mEq/L   Chloride 92 (L) 96 - 112 mEq/L   CO2 26 19 - 32 mEq/L   Glucose, Bld 143 (H) 70 - 99 mg/dL   BUN 47 (H) 6 - 23 mg/dL   Creatinine, Ser 2.59 (H) 0.50 - 1.10 mg/dL   Calcium 9.1 8.4 - 56.3 mg/dL   GFR calc non Af Amer 21 (L) >90 mL/min   GFR calc Af Amer 24 (L) >90 mL/min    Comment: (NOTE) The eGFR has been calculated using the CKD EPI equation. This calculation has not been validated in all clinical situations. eGFR's persistently <90 mL/min signify possible Chronic Kidney Disease.    Anion gap 18 (H) 5 - 15    ABGS No results for input(s): PHART, PO2ART, TCO2, HCO3 in the last 72 hours.  Invalid input(s): PCO2 CULTURES Recent Results (from the past 240 hour(s))  MRSA PCR Screening     Status: None   Collection Time: 04/04/14  5:25 PM  Result Value Ref Range Status   MRSA by PCR NEGATIVE NEGATIVE Final    Comment:        The GeneXpert MRSA Assay (FDA approved for NASAL specimens only), is one component of a comprehensive MRSA colonization surveillance program. It is not intended to diagnose MRSA infection nor to guide or monitor treatment for MRSA infections.    Studies/Results: Dg Abd 1 View  04/06/2014   CLINICAL DATA:  Followup endoscopy capsule from 04/05/2014  EXAM: ABDOMEN - 1 VIEW  COMPARISON:  None.  FINDINGS: There is a capsule projecting over the midline of the pelvis. It is difficult to determine exact location. This could be within the rectosigmoid colon or overlying small bowel loops.  Nonobstructive bowel gas pattern. No free air organomegaly. Scarring in the lung bases. Prior right hip replacement.  IMPRESSION:  Endoscopy capsule projects over the midline of the pelvis. This could be within the rectosigmoid colon or overlying small bowel loops.   Electronically Signed   By: Rolm Baptise M.D.   On: 04/06/2014 15:49    Medications:  Prior to Admission:  Prescriptions prior to admission  Medication Sig Dispense Refill Last Dose  . acetaminophen (TYLENOL) 325 MG tablet Take 2 tablets (650 mg total) by mouth every 6 (six) hours as needed for mild pain or fever.   04/04/2014  . albuterol (PROVENTIL) (2.5 MG/3ML) 0.083% nebulizer solution Take 2.5 mg by nebulization every 6 (six) hours as needed for wheezing or shortness of breath.   UNKNOWN  . ALPRAZolam (XANAX) 0.25 MG tablet Take 0.25 mg by mouth 3 (three) times daily as needed for anxiety.    04/04/2014  . amiodarone (PACERONE) 200 MG tablet Continue 200 mg twice a day until 05/31/13. Then decrease to 200 mg daily. (Patient taking differently: Take 200 mg by mouth daily. ) 60 tablet 1 04/04/2014  . apixaban (ELIQUIS) 2.5 MG TABS tablet Take 1 tablet (2.5 mg total) by mouth 2 (two) times daily. 60 tablet 0 04/04/2014  . aspirin EC 81 MG EC tablet Take 1 tablet (81 mg total) by mouth daily.   04/04/2014  . atorvastatin (LIPITOR) 80 MG tablet Take 80 mg by mouth daily.   04/04/2014  . HYDROcodone-acetaminophen (NORCO/VICODIN) 5-325 MG per tablet Take 1 tablet by mouth every 4 (four) hours as needed for moderate pain.    Past Week at Unknown time  . ibuprofen (ADVIL,MOTRIN) 200 MG tablet Take 400 mg by mouth every 6 (six) hours as needed for headache.   04/04/2014  . ipratropium (ATROVENT HFA) 17 MCG/ACT inhaler Inhale 1 puff into the lungs every 4 (four) hours as needed for wheezing.    04/04/2014  . ipratropium-albuterol (DUONEB) 0.5-2.5 (3) MG/3ML SOLN Take 3 mLs by nebulization every 6 (six) hours as needed (SHORTNESS OF BREATH).   04/04/2014 at 1100  . pantoprazole (PROTONIX) 40 MG tablet Take 40 mg by mouth daily.   04/04/2014  . predniSONE (DELTASONE) 10  MG tablet Take 10 mg by mouth daily with breakfast.   04/04/2014  . Linaclotide (LINZESS) 145 MCG CAPS capsule Take 1 capsule (145 mcg total) by mouth daily. Take 30 minutes prior to breakfast. (Patient not taking: Reported on 04/04/2014) 30 capsule 5 Unknown at Unknown time  . polyethylene glycol-electrolytes (TRILYTE) 420 G solution Take 4,000 mLs by mouth as directed. (Patient not taking: Reported on 04/04/2014) 4000 mL 0 10/24/2013 at 1800   Scheduled: . amiodarone  200 mg Oral Daily  . antiseptic oral rinse  7 mL Mouth Rinse BID  . atorvastatin  80 mg Oral q1800  . furosemide  40 mg Intravenous BID  . guaiFENesin  1,200 mg Oral BID  . ipratropium-albuterol  3 mL Nebulization Q4H  . methylPREDNISolone (SOLU-MEDROL) injection  60 mg Intravenous Q12H  . pantoprazole  40 mg Oral BID AC  . sodium chloride  3 mL Intravenous Q12H   Continuous:  STM:[id number] chloride, acetaminophen, ALPRAZolam, HYDROcodone-acetaminophen, ondansetron (ZOFRAN) IV, sodium chloride  Assesment: She was admitted with anemia and had heme positive  stool. Her anemia is felt to be multifactorial. She also had what appeared to be demand ischemia and elevated troponin. She has ischemic cardiomyopathy and it was felt that she did not need to have invasive treatment last cardiac hospitalization. She is improving slowly. She has been chronically anticoagulated for atrial fibrillation and this is being held Active Problems:   LOW BACK PAIN   CHF (congestive heart failure)   COPD (chronic obstructive pulmonary disease)   Tobacco abuse   Hypertension   Mental retardation, idiopathic mild   Acute respiratory failure   Acute systolic CHF (congestive heart failure)   CKD (chronic kidney disease), stage III   Atrial fibrillation   Cardiomyopathy, ischemic   Anemia   Chronic anticoagulation   GI bleed   Heme positive stool   Symptomatic anemia   Acute on chronic systolic congestive heart failure   IDA (iron deficiency  anemia)   CKD (chronic kidney disease) stage 4, GFR 15-29 ml/min   Severe left ventricular systolic dysfunction   Hyperlipidemia   Other emphysema    Plan: Continue current treatments I did not advance her diet. I would ask for GI input as far as advancing her diet    LOS: 3 days   Mubarak Bevens L 04/07/2014, 9:55 AM

## 2014-04-07 NOTE — Progress Notes (Signed)
Pt has an improved spirit after being changed to a heart healthy diet from a clear liquid diet. Pt up in chair for a few hours this morning and is currently napping. C/o headache around 1530 to which Tylenol was given and provided some relief for pt

## 2014-04-07 NOTE — Progress Notes (Signed)
  Subjective:  Patient does not feel well. She remains with cough and having difficulty expectorating. She denies chest or abdominal pain. She also denies melena or rectal bleeding.   Objective: Blood pressure 113/66, pulse 64, temperature 98 F (36.7 C), temperature source Oral, resp. rate 18, height 5\' 5"  (1.651 m), weight 105 lb 8 oz (47.854 kg), SpO2 82 %. Patient is alert and in no acute distress. She appears to be chronically ill. Cardiac exam with regular rhythm normal S1 and S2. No murmur or gallop noted. She has few scattered rhonchi bilaterally. Abdomen;  No LE edema or clubbing noted.  Labs/studies Results:   Recent Labs  04/05/14 0218 04/06/14 0620 04/07/14 0555  WBC 15.5* 18.9* 17.1*  HGB 8.0* 9.5* 10.1*  HCT 26.8* 31.4* 33.6*  PLT 399 374 382    BMET   Recent Labs  04/05/14 0218 04/06/14 0620 04/07/14 0555  NA 140 139 136*  K 4.2 4.6 4.6  CL 100 96 92*  CO2 25 27 26   GLUCOSE 140* 156* 143*  BUN 24* 37* 47*  CREATININE 2.20* 2.29* 2.26*  CALCIUM 8.8 9.1 9.1     Echo results noted; LVEF 30-35%.  Assessment:  #1. Anemia with heme positive stool. No evidence of overt GI bleed. She has received 2 units of PRBCs. Hemoglobin on admission was 6.2 and now 10.1. Last EGD and colonoscopy was in June 2015 and she is due for follow-up colonoscopy because she had complex polyp removed from splenic flexure. #2. COPD. #3.  Ischemic cardiomyopathy. She was felt to be in CHF on admission. #4.  Atrial fibrillation. Anticoagulation on hold.  Recommendations;  Advance diet. Re-evaluate patient in a.m. and determine if she should undergo colonoscopy prior to discharge.

## 2014-04-08 ENCOUNTER — Inpatient Hospital Stay (HOSPITAL_COMMUNITY): Payer: PRIVATE HEALTH INSURANCE

## 2014-04-08 ENCOUNTER — Encounter (HOSPITAL_COMMUNITY): Payer: Self-pay | Admitting: Gastroenterology

## 2014-04-08 DIAGNOSIS — R195 Other fecal abnormalities: Secondary | ICD-10-CM

## 2014-04-08 DIAGNOSIS — D649 Anemia, unspecified: Secondary | ICD-10-CM

## 2014-04-08 MED ORDER — NICOTINE 14 MG/24HR TD PT24
14.0000 mg | MEDICATED_PATCH | Freq: Every day | TRANSDERMAL | Status: DC
Start: 2014-04-08 — End: 2014-04-09
  Filled 2014-04-08: qty 1

## 2014-04-08 MED ORDER — SALINE SPRAY 0.65 % NA SOLN: 1.0000 | NASAL | Status: DC | PRN

## 2014-04-08 MED ORDER — PEG 3350-KCL-NA BICARB-NACL 420 G PO SOLR
4000.0000 mL | Freq: Once | ORAL | Status: AC
  Administered 2014-04-08: 4000 mL via ORAL
  Filled 2014-04-08: qty 4000

## 2014-04-08 NOTE — Progress Notes (Signed)
Subjective: Feeling better today, decreased coughing and shortness of breath. States she's feeling better and stronger after receiving PRBCs. Denies abdominal pain, melena, hematochezia, dysphagia, nausea, vomiting, or diarrhea. No other new complaints.  Objective: Vital signs in last 24 hours: Temp:  [98 F (36.7 C)-98.4 F (36.9 C)] 98 F (36.7 C) (11/23 0528) Pulse Rate:  [72-81] 78 (11/23 0528) Resp:  [18] 18 (11/23 0528) BP: (113-133)/(51-68) 113/51 mmHg (11/23 0528) SpO2:  [82 %-95 %] 94 % (11/23 0659) Weight:  [106 lb 1.6 oz (48.127 kg)] 106 lb 1.6 oz (48.127 kg) (11/23 0528) Last BM Date: 04/04/14 General:   Alert and oriented, pleasant Head:  Normocephalic and atraumatic. Eyes:  No icterus, sclera clear. Conjuctiva pink.  Heart:  S1, S2 present, no murmurs noted. Irregularly irregular rhythm. Lungs: Bilateral rhonchi cleared with cough. Otherwise CTA bilaterally..  Abdomen:  Bowel sounds present, soft, non-tender, non-distended. No HSM or hernias noted. No rebound or guarding. No masses appreciated  Extremities:  Without clubbing or edema. Neurologic:  Alert and  oriented x4;  grossly normal neurologically. Skin:  Warm and dry, intact without significant lesions.  Psych:  Alert and cooperative. Normal mood and affect.  Intake/Output from previous day: 11/22 0701 - 11/23 0700 In: 483 [P.O.:480; I.V.:3] Out: 1700 [Urine:1700] Intake/Output this shift: Total I/O In: -  Out: 300 [Urine:300]  Lab Results:  Recent Labs  04/06/14 0620 04/07/14 0555  WBC 18.9* 17.1*  HGB 9.5* 10.1*  HCT 31.4* 33.6*  PLT 374 382   BMET  Recent Labs  04/06/14 0620 04/07/14 0555  NA 139 136*  K 4.6 4.6  CL 96 92*  CO2 27 26  GLUCOSE 156* 143*  BUN 37* 47*  CREATININE 2.29* 2.26*  CALCIUM 9.1 9.1   LFT  Recent Labs  04/06/14 0620  PROT 6.3  ALBUMIN 3.0*  AST 14  ALT 9  ALKPHOS 86  BILITOT 0.6    Studies/Results: Dg Abd 1 View  04/06/2014   CLINICAL DATA:   Followup endoscopy capsule from 04/05/2014  EXAM: ABDOMEN - 1 VIEW  COMPARISON:  None.  FINDINGS: There is a capsule projecting over the midline of the pelvis. It is difficult to determine exact location. This could be within the rectosigmoid colon or overlying small bowel loops.  Nonobstructive bowel gas pattern. No free air organomegaly. Scarring in the lung bases. Prior right hip replacement.  IMPRESSION: Endoscopy capsule projects over the midline of the pelvis. This could be within the rectosigmoid colon or overlying small bowel loops.   Electronically Signed   By: Rolm Baptise M.D.   On: 04/06/2014 15:49    Assessment: Active Problems:   Anemia with heme+ stools on admission and initial admit hgb 6.2. Received 2 units PRBC with increase to 8.0 and subsequent steady increase to 10.1 yesterday. No signs of continued overt GI bleed. Due for interval colonoscopy, last study in June 2015 with complex polyp removal from splenic flexure. Anticoagulants currently on hold due to GI bleed. Discussed need for colonoscopy and patient agrees to have it done tomorrow while admitted.    Plan: Colonoscopy tomorrow with Dr. Gala Romney. Diet to full liquids and NPO after midnight Will start bowel prep this afternoon Repeat abdominal XRay today to check for passage of Agile capsule Consider capsule study as outpatient if negative colonoscopy.  Proceed with TCS with Dr. Gala Romney during admission: the risks, benefits, and alternatives have been discussed with the patient in detail. The patient states understanding and desires to  proceed.  Walden Field, AGNP-C Adult & Gerontological Nurse Practitioner [facility]    LOS: 4 days    04/08/2014, 9:28 AM  GI attending note; Plans as above. KUB reviewed; given capsule has passed.

## 2014-04-08 NOTE — Progress Notes (Signed)
Subjective: She feels okay. She has no new complaints. She says she has a headache and sinus problems. When I spoke to her about this and told her that it could be nicotine withdrawal she says that she's not smoking but then told me that she smokes about a half-pack of cigarettes daily.  Objective: Vital signs in last 24 hours: Temp:  [98 F (36.7 C)-98.4 F (36.9 C)] 98 F (36.7 C) (11/23 0528) Pulse Rate:  [72-81] 78 (11/23 0528) Resp:  [18] 18 (11/23 0528) BP: (113-133)/(51-68) 113/51 mmHg (11/23 0528) SpO2:  [82 %-95 %] 94 % (11/23 0659) Weight:  [48.127 kg (106 lb 1.6 oz)] 48.127 kg (106 lb 1.6 oz) (11/23 0528) Weight change: 0.272 kg (9.6 oz) Last BM Date: 04/04/14  Intake/Output from previous day: 11/22 0701 - 11/23 0700 In: 483 [P.O.:480; I.V.:3] Out: 1700 [Urine:1700]  PHYSICAL EXAM General appearance: alert, cooperative and mild distress Resp: rhonchi bilaterally Cardio: irregularly irregular rhythm GI: soft, non-tender; bowel sounds normal; no masses,  no organomegaly Extremities: extremities normal, atraumatic, no cyanosis or edema  Lab Results:  Results for orders placed or performed during the hospital encounter of 04/04/14 (from the past 48 hour(s))  CBC     Status: Abnormal   Collection Time: 04/07/14  5:55 AM  Result Value Ref Range   WBC 17.1 (H) 4.0 - 10.5 K/uL   RBC 4.27 3.87 - 5.11 MIL/uL   Hemoglobin 10.1 (Dillon) 12.0 - 15.0 g/dL   HCT 33.6 (Dillon) 36.0 - 46.0 %   MCV 78.7 78.0 - 100.0 fL   MCH 23.7 (Dillon) 26.0 - 34.0 pg   MCHC 30.1 30.0 - 36.0 g/dL   RDW 19.4 (H) 11.5 - 15.5 %   Platelets 382 150 - 400 K/uL  Basic metabolic panel     Status: Abnormal   Collection Time: 04/07/14  5:55 AM  Result Value Ref Range   Sodium 136 (Dillon) 137 - 147 mEq/Dillon   Potassium 4.6 3.7 - 5.3 mEq/Dillon   Chloride 92 (Dillon) 96 - 112 mEq/Dillon   CO2 26 19 - 32 mEq/Dillon   Glucose, Bld 143 (H) 70 - 99 mg/dL   BUN 47 (H) 6 - 23 mg/dL   Creatinine, Ser 2.26 (H) 0.50 - 1.10 mg/dL   Calcium 9.1  8.4 - 10.5 mg/dL   GFR calc non Af Amer 21 (Dillon) >90 mL/min   GFR calc Af Amer 24 (Dillon) >90 mL/min    Comment: (NOTE) The eGFR has been calculated using the CKD EPI equation. This calculation has not been validated in all clinical situations. eGFR's persistently <90 mL/min signify possible Chronic Kidney Disease.    Anion gap 18 (H) 5 - 15    ABGS No results for input(s): PHART, PO2ART, TCO2, HCO3 in the last 72 hours.  Invalid input(s): PCO2 CULTURES Recent Results (from the past 240 hour(s))  MRSA PCR Screening     Status: None   Collection Time: 04/04/14  5:25 PM  Result Value Ref Range Status   MRSA by PCR NEGATIVE NEGATIVE Final    Comment:        The GeneXpert MRSA Assay (FDA approved for NASAL specimens only), is one component of a comprehensive MRSA colonization surveillance program. It is not intended to diagnose MRSA infection nor to guide or monitor treatment for MRSA infections.    Studies/Results: Dg Abd 1 View  04/06/2014   CLINICAL DATA:  Followup endoscopy capsule from 04/05/2014  EXAM: ABDOMEN - 1 VIEW  COMPARISON:  None.  FINDINGS: There is a capsule projecting over the midline of the pelvis. It is difficult to determine exact location. This could be within the rectosigmoid colon or overlying small bowel loops.  Nonobstructive bowel gas pattern. No free air organomegaly. Scarring in the lung bases. Prior right hip replacement.  IMPRESSION: Endoscopy capsule projects over the midline of the pelvis. This could be within the rectosigmoid colon or overlying small bowel loops.   Electronically Signed   By: Charlett Nose M.D.   On: 04/06/2014 15:49    Medications:  Prior to Admission:  Prescriptions prior to admission  Medication Sig Dispense Refill Last Dose  . acetaminophen (TYLENOL) 325 MG tablet Take 2 tablets (650 mg total) by mouth every 6 (six) hours as needed for mild pain or fever.   04/04/2014  . albuterol (PROVENTIL) (2.5 MG/3ML) 0.083% nebulizer  solution Take 2.5 mg by nebulization every 6 (six) hours as needed for wheezing or shortness of breath.   UNKNOWN  . ALPRAZolam (XANAX) 0.25 MG tablet Take 0.25 mg by mouth 3 (three) times daily as needed for anxiety.    04/04/2014  . amiodarone (PACERONE) 200 MG tablet Continue 200 mg twice a day until 05/31/13. Then decrease to 200 mg daily. (Patient taking differently: Take 200 mg by mouth daily. ) 60 tablet 1 04/04/2014  . apixaban (ELIQUIS) 2.5 MG TABS tablet Take 1 tablet (2.5 mg total) by mouth 2 (two) times daily. 60 tablet 0 04/04/2014  . aspirin EC 81 MG EC tablet Take 1 tablet (81 mg total) by mouth daily.   04/04/2014  . atorvastatin (LIPITOR) 80 MG tablet Take 80 mg by mouth daily.   04/04/2014  . HYDROcodone-acetaminophen (NORCO/VICODIN) 5-325 MG per tablet Take 1 tablet by mouth every 4 (four) hours as needed for moderate pain.    Past Week at Unknown time  . ibuprofen (ADVIL,MOTRIN) 200 MG tablet Take 400 mg by mouth every 6 (six) hours as needed for headache.   04/04/2014  . ipratropium (ATROVENT HFA) 17 MCG/ACT inhaler Inhale 1 puff into the lungs every 4 (four) hours as needed for wheezing.    04/04/2014  . ipratropium-albuterol (DUONEB) 0.5-2.5 (3) MG/3ML SOLN Take 3 mLs by nebulization every 6 (six) hours as needed (SHORTNESS OF BREATH).   04/04/2014 at 1100  . pantoprazole (PROTONIX) 40 MG tablet Take 40 mg by mouth daily.   04/04/2014  . predniSONE (DELTASONE) 10 MG tablet Take 10 mg by mouth daily with breakfast.   04/04/2014  . Linaclotide (LINZESS) 145 MCG CAPS capsule Take 1 capsule (145 mcg total) by mouth daily. Take 30 minutes prior to breakfast. (Patient not taking: Reported on 04/04/2014) 30 capsule 5 Unknown at Unknown time  . polyethylene glycol-electrolytes (TRILYTE) 420 G solution Take 4,000 mLs by mouth as directed. (Patient not taking: Reported on 04/04/2014) 4000 mL 0 10/24/2013 at 1800   Scheduled: . amiodarone  200 mg Oral Daily  . antiseptic oral rinse  7 mL  Mouth Rinse BID  . atorvastatin  80 mg Oral q1800  . furosemide  40 mg Intravenous BID  . guaiFENesin  1,200 mg Oral BID  . ipratropium-albuterol  3 mL Nebulization Q4H  . methylPREDNISolone (SOLU-MEDROL) injection  60 mg Intravenous Q12H  . nicotine  14 mg Transdermal Daily  . pantoprazole  40 mg Oral BID AC  . sodium chloride  3 mL Intravenous Q12H   Continuous:  [id number] chloride, acetaminophen, ALPRAZolam, HYDROcodone-acetaminophen, ondansetron (ZOFRAN) IV, sodium chloride, sodium chloride  Assesment: She  was admitted with anemia. She had heme positive stool but is not clear that she had significant GI bleed. She is being worked up. She has chronic congestive heart failure with severe left ventricular systolic dysfunction which is stable. She had been anticoagulated because of atrial fibrillation and this has been held. She has COPD and continues to smoke Active Problems:   LOW BACK PAIN   CHF (congestive heart failure)   COPD (chronic obstructive pulmonary disease)   Tobacco abuse   Hypertension   Mental retardation, idiopathic mild   Acute respiratory failure   Acute systolic CHF (congestive heart failure)   CKD (chronic kidney disease), stage III   Atrial fibrillation   Cardiomyopathy, ischemic   Anemia   Chronic anticoagulation   GI bleed   Heme positive stool   Symptomatic anemia   Acute on chronic systolic congestive heart failure   IDA (iron deficiency anemia)   CKD (chronic kidney disease) stage 4, GFR 15-29 ml/min   Severe left ventricular systolic dysfunction   Hyperlipidemia   Other emphysema    Plan: Add saline nasal spray and nicotine patch. Continue other treatments. Her hemoglobin level was over 10 yesterday.    LOS: 4 days   Alexis Dillon 04/08/2014, 9:04 AM

## 2014-04-08 NOTE — Clinical Social Work Placement (Signed)
Clinical Social Work Department CLINICAL SOCIAL WORK PLACEMENT NOTE 04/08/2014  Patient:  NIOMI, VALENT  Account Number:  [id number] [place] date:  04/04/2014  Clinical Social Worker:  Benay Pike, LCSW  Date/time:  04/08/2014 02:01 PM  Clinical Social Work is seeking post-discharge placement for this patient at the following level of care:   SKILLED NURSING   (*CSW will update this form in Epic as items are completed)   04/08/2014  Patient/family provided with [place] Department of Clinical Social Work's list of facilities offering this level of care within the geographic area requested by the patient (or if unable, by the patient's family).  04/08/2014  Patient/family informed of their freedom to choose among providers that offer the needed level of care, that participate in Medicare, Medicaid or managed care program needed by the patient, have an available bed and are willing to accept the patient.  04/08/2014  Patient/family informed of [facility]' ownership interest in [facility], as well as of the fact that they are under no obligation to receive care at this facility.  PASARR submitted to EDS on  PASARR number received on   FL2 transmitted to all facilities in geographic area requested by pt/family on  04/08/2014 FL2 transmitted to all facilities within larger geographic area on   Patient informed that his/her managed care company has contracts with or will negotiate with  certain facilities, including the following:     Patient/family informed of bed offers received:   Patient chooses bed at  Physician recommends and patient chooses bed at    Patient to be transferred to  on   Patient to be transferred to facility by  Patient and family notified of transfer on  Name of family member notified:    The following physician request were entered in Epic:   Additional Comments: Pt has existing pasarr.  Benay Pike, [place]

## 2014-04-08 NOTE — Evaluation (Signed)
Physical Therapy Evaluation Patient Details Name: Alexis Dillon MRN: [id number] DOB: 1945/08/08 Today's Date: 04/08/2014   History of Present Illness  Pt is a 68 year old female with a hx of mild mental retardation, COPD, CHF, ischemic cardiomyopathy and chronic lumbar pain.  She was admitted for anemia with possible GI bleed, CHF and exacerbation of COPD.  She lives with her sister and is ambuatory with a cane or walker.  Clinical Impression   Pt is a very pleasant female who is able to follow all directions.  She is found to be mildly deconditioned due to recent illness.  She is on continuous supplemental O2 at 2 L/min however O2 sat= 90% at rest.  O2 was increased to 3L/min for gait and O2 sat =90% at end of gait.  This was reported to RN and pt was left up in a chair on 3 L/O2.  I am recommending SNF at d/c and pt is in agreement.    Follow Up Recommendations SNF    Equipment Recommendations  None recommended by PT    Recommendations for Other Services   none    Precautions / Restrictions Precautions Precautions: Fall Restrictions Weight Bearing Restrictions: No      Mobility  Bed Mobility Overal bed mobility: Modified Independent                Transfers Overall transfer level: Needs assistance Equipment used: Rolling walker (2 wheeled) Transfers: Sit to/from [employer] Transfers Sit to Stand: Supervision Stand pivot transfers: Supervision       General transfer comment: pt becomes a bit dizzy with positional change and needs instruction to pause before changing position again  Ambulation/Gait Ambulation/Gait assistance: Supervision Ambulation Distance (Feet): 100 Feet Assistive device: Rolling walker (2 wheeled) Gait Pattern/deviations: Shuffle;Trunk flexed Gait velocity: pt has decreased left knee extension in stance Gait velocity interpretation: Below normal speed for age/gender    Stairs            Wheelchair Mobility    Modified  Rankin (Stroke Patients Only)       Balance Overall balance assessment: Needs assistance Sitting-balance support: No upper extremity supported;Feet supported Sitting balance-Leahy Scale: Good     Standing balance support: No upper extremity supported Standing balance-Leahy Scale: Fair                               Pertinent Vitals/Pain Pain Assessment: No/denies pain    Home Living Family/patient expects to be discharged to:: Skilled nursing facility                      Prior Function Level of Independence: Needs assistance   Gait / Transfers Assistance Needed: walker or cane  ADL's / Homemaking Assistance Needed: assist with meals and housekeeping, SBA with bathing        Hand Dominance   Dominant Hand: Right    Extremity/Trunk Assessment   Upper Extremity Assessment: Overall WFL for tasks assessed           Lower Extremity Assessment: Generalized weakness      Cervical / Trunk Assessment: Kyphotic  Communication   Communication: No difficulties  Cognition Arousal/Alertness: Awake/alert Behavior During Therapy: WFL for tasks assessed/performed Overall Cognitive Status: History of cognitive impairments - at baseline                      General Comments  Exercises        Assessment/Plan    PT Assessment Patient needs continued PT services  PT Diagnosis Difficulty walking;Generalized weakness   PT Problem List Decreased strength;Decreased activity tolerance;Decreased mobility  PT Treatment Interventions Gait training;Therapeutic exercise;Patient/family education;Balance training   PT Goals (Current goals can be found in the Care Plan section) Acute Rehab PT Goals Patient Stated Goal: none stated PT Goal Formulation: With patient Time For Goal Achievement: 04/08/14 Potential to Achieve Goals: Good    Frequency Min 3X/week   Barriers to discharge Decreased caregiver support pt reports that her sister is not  in good health    Co-evaluation               End of Session Equipment Utilized During Treatment: Gait belt Activity Tolerance: Patient tolerated treatment well Patient left: in chair;with call bell/phone within reach;with chair alarm set;with nursing/sitter in room Nurse Communication: Mobility status         Time: 0932-1000 PT Time Calculation (min) (ACUTE ONLY): 28 min   Charges:   PT Evaluation $Initial PT Evaluation Tier I: 1 Procedure     PT G CodesDemetrios Isaacs L 04/08/2014, 10:12 AM

## 2014-04-08 NOTE — Plan of Care (Signed)
Problem: Phase I Progression Outcomes Goal: Up in chair, BRP Outcome: Completed/Met Date Met:  04/08/14 Goal: Initial discharge plan identified Outcome: Completed/Met Date Met:  04/08/14

## 2014-04-08 NOTE — Clinical Social Work Psychosocial (Signed)
Clinical Social Work Department BRIEF PSYCHOSOCIAL ASSESSMENT 04/08/2014  Patient:  Alexis Dillon, Alexis Dillon     Account Number:  [id number]     Admit date:  04/04/2014  Clinical Social Worker:  Wyatt Haste  Date/Time:  04/08/2014 02:03 PM  Referred by:  CSW  Date Referred:  04/08/2014 Referred for  SNF Placement   Other Referral:   Interview type:  Patient Other interview type:   sister-in-law Alexis Dillon    PSYCHOSOCIAL DATA Living Status:  FAMILY Admitted from facility:   Level of care:   Primary support name:  Alexis Dillon Primary support relationship to patient:  [place] Degree of support available:   supportive    CURRENT CONCERNS Current Concerns  Post-Acute Placement   Other Concerns:    SOCIAL WORK ASSESSMENT / PLAN CSW met with pt at bedside. Pt alert and oriented and reports she lives with her sister, Alexis Dillon. Pt has supportive family. She states she is alone during the day while Alexis Dillon works. Pt called EMS last Thursday due to shortness of breath. She states she was scared, especially since she was alone. Pt expresses that when she is sick she feels very tired and feels she needs SNF. PT evaluated pt and has made this recommendation. At baseline, pt states she uses a walker or cane. She just received a wheelchair which she said hasn't been used yet. Pt appears to be fairly sedentary. CSW discussed placement process and pt requests [facility] where she has been in the past. She asks CSW to share with her sister-in-law Alexis Dillon. Alexis Dillon is also agreeable and will share with the rest of the family. Pt expresses that she wishes to get stronger and then plans to return back home. SNF list left in room.   Assessment/plan status:  Psychosocial Support/Ongoing Assessment of Needs Other assessment/ plan:   Information/referral to community resources:   SNF list    PATIENT'S/FAMILY'S RESPONSE TO PLAN OF CARE: Pt agreeable to short term SNF at d/c. CSW will initiate bed search and follow up.        Alexis Dillon, Alexis Dillon

## 2014-04-09 MED ORDER — PREDNISONE 20 MG PO TABS
40.0000 mg | ORAL_TABLET | Freq: Every day | ORAL | Status: DC
  Administered 2014-04-09 – 2014-04-10 (×2): 40 mg via ORAL
  Filled 2014-04-09 (×2): qty 2

## 2014-04-09 MED ORDER — POLYETHYLENE GLYCOL 3350 17 G PO PACK
17.0000 g | PACK | ORAL | Status: AC
  Administered 2014-04-09 (×4): 17 g via ORAL
  Filled 2014-04-09 (×3): qty 1

## 2014-04-09 MED ORDER — BISACODYL 5 MG PO TBEC
10.0000 mg | DELAYED_RELEASE_TABLET | Freq: Once | ORAL | Status: AC
  Administered 2014-04-09: 10 mg via ORAL
  Filled 2014-04-09: qty 2

## 2014-04-09 MED ORDER — POLYETHYLENE GLYCOL 3350 17 G PO PACK
17.0000 g | PACK | ORAL | Status: AC
  Administered 2014-04-09 (×5): 17 g via ORAL
  Filled 2014-04-09 (×3): qty 1

## 2014-04-09 MED ORDER — BISACODYL 5 MG PO TBEC
10.0000 mg | DELAYED_RELEASE_TABLET | ORAL | Status: AC
  Administered 2014-04-09 (×2): 10 mg via ORAL
  Filled 2014-04-09: qty 2

## 2014-04-09 MED ORDER — IPRATROPIUM-ALBUTEROL 0.5-2.5 (3) MG/3ML IN SOLN
3.0000 mL | Freq: Three times a day (TID) | RESPIRATORY_TRACT | Status: DC
  Administered 2014-04-09 – 2014-04-10 (×3): 3 mL via RESPIRATORY_TRACT
  Filled 2014-04-09 (×3): qty 3

## 2014-04-09 MED ORDER — ALBUTEROL SULFATE (2.5 MG/3ML) 0.083% IN NEBU
2.5000 mg | INHALATION_SOLUTION | RESPIRATORY_TRACT | Status: DC | PRN
  Administered 2014-04-10: 2.5 mg via RESPIRATORY_TRACT
  Filled 2014-04-09: qty 3

## 2014-04-09 NOTE — Progress Notes (Signed)
Subjective: She says she feels okay. She has decided not to wear the nicotine patch. She is being set up for colonoscopy but she has refused to take the rest of her prep.  Objective: Vital signs in last 24 hours: Temp:  [98 F (36.7 C)-98.9 F (37.2 C)] 98.9 F (37.2 C) (11/24 0535) Pulse Rate:  [66-73] 73 (11/24 0535) Resp:  [13-18] 18 (11/24 0535) BP: (104-123)/(45-62) 123/45 mmHg (11/24 0535) SpO2:  [88 %-95 %] 88 % (11/24 0656) Weight:  [48.022 kg (105 lb 13.9 oz)] 48.022 kg (105 lb 13.9 oz) (11/24 0535) Weight change: -0.105 kg (-3.7 oz) Last BM Date: 04/08/14  Intake/Output from previous day: 11/23 0701 - 11/24 0700 In: 1240 [P.O.:1240] Out: 2400 [Urine:2400]  PHYSICAL EXAM General appearance: alert, cooperative and mild distress Resp: rhonchi bilaterally Cardio: irregularly irregular rhythm GI: soft, non-tender; bowel sounds normal; no masses,  no organomegaly Extremities: extremities normal, atraumatic, no cyanosis or edema  Lab Results:  No results found for this or any previous visit (from the past 48 hour(s)).  ABGS No results for input(s): PHART, PO2ART, TCO2, HCO3 in the last 72 hours.  Invalid input(s): PCO2 CULTURES Recent Results (from the past 240 hour(s))  MRSA PCR Screening     Status: None   Collection Time: 04/04/14  5:25 PM  Result Value Ref Range Status   MRSA by PCR NEGATIVE NEGATIVE Final    Comment:        The GeneXpert MRSA Assay (FDA approved for NASAL specimens only), is one component of a comprehensive MRSA colonization surveillance program. It is not intended to diagnose MRSA infection nor to guide or monitor treatment for MRSA infections.    Studies/Results: Dg Abd 1 View  04/08/2014   CLINICAL DATA:  68 year old female with excessive belching. History of a recent Agile capsule study.  EXAM: ABDOMEN - 1 VIEW  COMPARISON:  Most recent prior abdominal radiographs 04/06/2014.  FINDINGS: The bowel gas pattern is within normal  limits. No evidence of obstruction. The radiopaque capsule previously noted overlying the rectum is no longer visible. Levo convex and rotary scoliosis centered at L2 remains unchanged. Multilevel degenerative disc disease. No evidence of large free air on this single supine radiograph. Surgical changes of prior right hip total arthroplasty are incompletely imaged. No evidence of complication involving the visualized portion. Atherosclerotic vascular calcifications are again noted.  IMPRESSION: 1. Unremarkable bowel gas pattern. 2. Agile endoscopy capsule is no longer visible.   Electronically Signed   By: Jacqulynn Cadet M.D.   On: 04/08/2014 11:56    Medications:  Prior to Admission:  Prescriptions prior to admission  Medication Sig Dispense Refill Last Dose  . acetaminophen (TYLENOL) 325 MG tablet Take 2 tablets (650 mg total) by mouth every 6 (six) hours as needed for mild pain or fever.   04/04/2014  . albuterol (PROVENTIL) (2.5 MG/3ML) 0.083% nebulizer solution Take 2.5 mg by nebulization every 6 (six) hours as needed for wheezing or shortness of breath.   UNKNOWN  . ALPRAZolam (XANAX) 0.25 MG tablet Take 0.25 mg by mouth 3 (three) times daily as needed for anxiety.    04/04/2014  . amiodarone (PACERONE) 200 MG tablet Continue 200 mg twice a day until 05/31/13. Then decrease to 200 mg daily. (Patient taking differently: Take 200 mg by mouth daily. ) 60 tablet 1 04/04/2014  . apixaban (ELIQUIS) 2.5 MG TABS tablet Take 1 tablet (2.5 mg total) by mouth 2 (two) times daily. 60 tablet 0 04/04/2014  .  aspirin EC 81 MG EC tablet Take 1 tablet (81 mg total) by mouth daily.   04/04/2014  . atorvastatin (LIPITOR) 80 MG tablet Take 80 mg by mouth daily.   04/04/2014  . HYDROcodone-acetaminophen (NORCO/VICODIN) 5-325 MG per tablet Take 1 tablet by mouth every 4 (four) hours as needed for moderate pain.    Past Week at Unknown time  . ibuprofen (ADVIL,MOTRIN) 200 MG tablet Take 400 mg by mouth every 6 (six)  hours as needed for headache.   04/04/2014  . ipratropium (ATROVENT HFA) 17 MCG/ACT inhaler Inhale 1 puff into the lungs every 4 (four) hours as needed for wheezing.    04/04/2014  . ipratropium-albuterol (DUONEB) 0.5-2.5 (3) MG/3ML SOLN Take 3 mLs by nebulization every 6 (six) hours as needed (SHORTNESS OF BREATH).   04/04/2014 at 1100  . pantoprazole (PROTONIX) 40 MG tablet Take 40 mg by mouth daily.   04/04/2014  . predniSONE (DELTASONE) 10 MG tablet Take 10 mg by mouth daily with breakfast.   04/04/2014  . Linaclotide (LINZESS) 145 MCG CAPS capsule Take 1 capsule (145 mcg total) by mouth daily. Take 30 minutes prior to breakfast. (Patient not taking: Reported on 04/04/2014) 30 capsule 5 Unknown at Unknown time  . polyethylene glycol-electrolytes (TRILYTE) 420 G solution Take 4,000 mLs by mouth as directed. (Patient not taking: Reported on 04/04/2014) 4000 mL 0 10/24/2013 at 1800   Scheduled: . amiodarone  200 mg Oral Daily  . antiseptic oral rinse  7 mL Mouth Rinse BID  . atorvastatin  80 mg Oral q1800  . bisacodyl  10 mg Oral Once  . furosemide  40 mg Intravenous BID  . guaiFENesin  1,200 mg Oral BID  . ipratropium-albuterol  3 mL Nebulization Q4H  . pantoprazole  40 mg Oral BID AC  . polyethylene glycol  17 g Oral Q1H  . predniSONE  40 mg Oral Q breakfast  . sodium chloride  3 mL Intravenous Q12H   Continuous:  SFK:CLEXNT chloride, acetaminophen, ALPRAZolam, HYDROcodone-acetaminophen, ondansetron (ZOFRAN) IV, sodium chloride, sodium chloride  Assesment: She was admitted with multiple medical problems including anemia acute respiratory failure and acute systolic congestive heart failure. She had heme positive stool but is not clear that she had an active GI bleed. She is doing better. She is being set up for colonoscopy today. Active Problems:   LOW BACK PAIN   CHF (congestive heart failure)   COPD (chronic obstructive pulmonary disease)   Tobacco abuse   Hypertension   Mental  retardation, idiopathic mild   Acute respiratory failure   Acute systolic CHF (congestive heart failure)   CKD (chronic kidney disease), stage III   Atrial fibrillation   Cardiomyopathy, ischemic   Anemia   Chronic anticoagulation   GI bleed   Heme positive stool   Symptomatic anemia   Acute on chronic systolic congestive heart failure   IDA (iron deficiency anemia)   CKD (chronic kidney disease) stage 4, GFR 15-29 ml/min   Severe left ventricular systolic dysfunction   Hyperlipidemia   Other emphysema    Plan: If she can have colonoscopy today than she could probably be transferred to skilled care facility tomorrow.    LOS: 5 days   Rafael Salway L 04/09/2014, 9:04 AM

## 2014-04-09 NOTE — Progress Notes (Signed)
Pt will probable need O2 at home

## 2014-04-09 NOTE — Clinical Social Work Note (Signed)
Pt accepted bed offer at [facility]. Facility and sister-in-law, [name] notified. Awaiting stability for d/c.  Benay Pike, [place]

## 2014-04-09 NOTE — Progress Notes (Signed)
Physical Therapy Treatment Patient Details Name: Alexis Dillon MRN: [id number] DOB: Nov 19, 1945 Today's Date: 04/09/2014    History of Present Illness Pt is a 68 year old female with a hx of mild mental retardation, COPD, CHF, ischemic cardiomyopathy and chronic lumbar pain.  She was admitted for anemia with possible GI bleed, CHF and exacerbation of COPD.  She lives with her sister and is ambuatory with a cane or walker.    PT Comments    Pt c/o significant malaise today, feels "weak".  She was apparently scheduled for a colonoscopy today but she had declined this procedure.  She continues to be on 3 L O2 to maintain O2 sats over 90.  She refuses to participate in any strengthening exercise due to malaise and fatigue but agreed to walk.  Gait endurance was decreased from yesterday (80' instead of 100'), but stability was adequate.  Follow Up Recommendations  SNF     Equipment Recommendations  None recommended by PT    Recommendations for Other Services  none     Precautions / Restrictions Precautions Precautions: Fall Restrictions Weight Bearing Restrictions: No    Mobility  Bed Mobility Overal bed mobility: Modified Independent                Transfers Overall transfer level: Needs assistance Equipment used: Rolling walker (2 wheeled) Transfers: Sit to/from Stand Sit to Stand: Supervision         General transfer comment: no dizziness with postural changes today  Ambulation/Gait Ambulation/Gait assistance: Supervision Ambulation Distance (Feet): 80 Feet Assistive device: Rolling walker (2 wheeled) Gait Pattern/deviations: Shuffle;Trunk flexed   Gait velocity interpretation: Below normal speed for age/gender General Gait Details: O2 at 3 L/min, pt instructed in keeping the walker closer to her and in increasing thoracic extension   Stairs            Wheelchair Mobility    Modified Rankin (Stroke Patients Only)       Balance     Sitting  balance-Leahy Scale: Good       Standing balance-Leahy Scale: Fair                      Cognition Arousal/Alertness: Awake/alert Behavior During Therapy: WFL for tasks assessed/performed Overall Cognitive Status: History of cognitive impairments - at baseline                      Exercises      General Comments        Pertinent Vitals/Pain Pain Assessment: No/denies pain    Home Living                      Prior Function            PT Goals (current goals can now be found in the care plan section) Progress towards PT goals: Progressing toward goals    Frequency       PT Plan Current plan remains appropriate    Co-evaluation             End of Session Equipment Utilized During Treatment: Gait belt Activity Tolerance: Patient limited by fatigue Patient left: in chair;with call bell/phone within reach;with chair alarm set     Time: 6010-9323 PT Time Calculation (min) (ACUTE ONLY): 27 min  Charges:  $Gait Training: 8-22 mins                    G Codes:  Demetrios Isaacs L 04/09/2014, 3:04 PM

## 2014-04-09 NOTE — Clinical Social Work Placement (Signed)
Clinical Social Work Department CLINICAL SOCIAL WORK PLACEMENT NOTE 04/09/2014  Patient:  Alexis Dillon, Alexis Dillon  Account Number:  [id number] [place] date:  04/04/2014  Clinical Social Worker:  Benay Pike, LCSW  Date/time:  04/08/2014 02:01 PM  Clinical Social Work is seeking post-discharge placement for this patient at the following level of care:   SKILLED NURSING   (*CSW will update this form in Epic as items are completed)   04/08/2014  Patient/family provided with [place] Department of Clinical Social Work's list of facilities offering this level of care within the geographic area requested by the patient (or if unable, by the patient's family).  04/08/2014  Patient/family informed of their freedom to choose among providers that offer the needed level of care, that participate in Medicare, Medicaid or managed care program needed by the patient, have an available bed and are willing to accept the patient.  04/08/2014  Patient/family informed of [facility]' ownership interest in [facility], as well as of the fact that they are under no obligation to receive care at this facility.  PASARR submitted to EDS on  PASARR number received on   FL2 transmitted to all facilities in geographic area requested by pt/family on  04/08/2014 FL2 transmitted to all facilities within larger geographic area on   Patient informed that his/her managed care company has contracts with or will negotiate with  certain facilities, including the following:     Patient/family informed of bed offers received:  04/09/2014 Patient chooses bed at [facility] Physician recommends and patient chooses bed at  [place]  Patient to be transferred to  on   Patient to be transferred to facility by  Patient and family notified of transfer on  Name of family member notified:    The following physician request were entered in Epic:   Additional Comments: Pt has existing pasarr.   Benay Pike, [place]

## 2014-04-09 NOTE — Progress Notes (Addendum)
Patient refusing bowel prep. Drank less than 1/2 golytely yesterday. States makes her nauseated. Soft clumps of stool. Added Dulcolax 10 mg po X 3 doses every hours, along with additional Miralax 17 grams every hour X 4 doses.  Orvil Feil, ANP-BC [facility]    Attending note:  Patient seen and examined this afternoon. She has not yet been adequately prepped. An adequate preparation is imperative in this setting. We'll hold off on attempting colonoscopy today. Will work on additional prep and shoot for tomorrow as feasible. I discussed my reasoning and changing plans. Importance of inadequate prep explained to the patient in detail. She states she will cooperate.

## 2014-04-10 ENCOUNTER — Encounter (HOSPITAL_COMMUNITY)
Admission: EM | Disposition: A | Discharge status: Skilled Nursing Facility | Payer: Self-pay | Source: Home / Self Care | Attending: Pulmonary Disease

## 2014-04-10 ENCOUNTER — Other Ambulatory Visit: Payer: Self-pay

## 2014-04-10 ENCOUNTER — Telehealth: Payer: Self-pay | Admitting: General Practice

## 2014-04-10 ENCOUNTER — Encounter (HOSPITAL_COMMUNITY): Payer: Self-pay | Admitting: *Deleted

## 2014-04-10 DIAGNOSIS — K922 Gastrointestinal hemorrhage, unspecified: Secondary | ICD-10-CM

## 2014-04-10 DIAGNOSIS — K5730 Diverticulosis of large intestine without perforation or abscess without bleeding: Secondary | ICD-10-CM | POA: Insufficient documentation

## 2014-04-10 HISTORY — PX: COLONOSCOPY: SHX5424

## 2014-04-10 LAB — CBC
HEMATOCRIT: 40.9 % (ref 36.0–46.0)
Hemoglobin: 12.1 g/dL (ref 12.0–15.0)
MCH: 24 pg — ABNORMAL LOW (ref 26.0–34.0)
MCHC: 29.6 g/dL — ABNORMAL LOW (ref 30.0–36.0)
MCV: 81 fL (ref 78.0–100.0)
Platelets: 323 10*3/uL (ref 150–400)
RBC: 5.05 MIL/uL (ref 3.87–5.11)
RDW: 20.7 % — AB (ref 11.5–15.5)
WBC: 17.7 10*3/uL — AB (ref 4.0–10.5)

## 2014-04-10 LAB — BASIC METABOLIC PANEL
Anion gap: 17 — ABNORMAL HIGH (ref 5–15)
BUN: 62 mg/dL — AB (ref 6–23)
CO2: 28 meq/L (ref 19–32)
CREATININE: 2 mg/dL — AB (ref 0.50–1.10)
Calcium: 9.3 mg/dL (ref 8.4–10.5)
Chloride: 96 mEq/L (ref 96–112)
GFR calc Af Amer: 28 mL/min — ABNORMAL LOW (ref >90)
GFR calc non Af Amer: 24 mL/min — ABNORMAL LOW (ref >90)
Glucose, Bld: 131 mg/dL — ABNORMAL HIGH (ref 70–99)
Potassium: 3.6 mEq/L — ABNORMAL LOW (ref 3.7–5.3)
Sodium: 141 mEq/L (ref 137–147)

## 2014-04-10 SURGERY — COLONOSCOPY
Anesthesia: Moderate Sedation

## 2014-04-10 MED ORDER — GUAIFENESIN ER 600 MG PO TB12: 1200.0000 mg | ORAL_TABLET | Freq: Two times a day (BID) | ORAL | Status: DC

## 2014-04-10 MED ORDER — SODIUM CHLORIDE 0.9 % IV SOLN
INTRAVENOUS | Status: DC
Start: 2014-04-10 — End: 2014-04-10

## 2014-04-10 MED ORDER — ONDANSETRON HCL 4 MG/2ML IJ SOLN
INTRAMUSCULAR | Status: AC
  Filled 2014-04-10: qty 2

## 2014-04-10 MED ORDER — MEPERIDINE HCL 100 MG/ML IJ SOLN
INTRAMUSCULAR | Status: DC | PRN
  Administered 2014-04-10: 25 mg

## 2014-04-10 MED ORDER — SODIUM CHLORIDE 0.9 % IV SOLN
INTRAVENOUS | Status: DC
  Administered 2014-04-10: 14:00:00 via INTRAVENOUS

## 2014-04-10 MED ORDER — MIDAZOLAM HCL 5 MG/5ML IJ SOLN
INTRAMUSCULAR | Status: DC | PRN
  Administered 2014-04-10 (×2): 1 mg via INTRAVENOUS

## 2014-04-10 MED ORDER — PANTOPRAZOLE SODIUM 40 MG PO TBEC: 40.0000 mg | DELAYED_RELEASE_TABLET | Freq: Two times a day (BID) | ORAL | Status: DC

## 2014-04-10 MED ORDER — ALBUTEROL SULFATE (2.5 MG/3ML) 0.083% IN NEBU: 2.5000 mg | INHALATION_SOLUTION | RESPIRATORY_TRACT | Status: DC | PRN

## 2014-04-10 MED ORDER — MEPERIDINE HCL 100 MG/ML IJ SOLN
INTRAMUSCULAR | Status: DC
Start: 2014-04-10 — End: 2014-04-10
  Filled 2014-04-10: qty 2

## 2014-04-10 MED ORDER — PREDNISONE 10 MG PO TABS: 40.0000 mg | ORAL_TABLET | Freq: Every day | ORAL | Status: DC

## 2014-04-10 MED ORDER — SALINE SPRAY 0.65 % NA SOLN: 1.0000 | NASAL | Status: DC | PRN

## 2014-04-10 MED ORDER — ONDANSETRON HCL 4 MG/2ML IJ SOLN
INTRAMUSCULAR | Status: DC | PRN
  Administered 2014-04-10: 4 mg via INTRAMUSCULAR

## 2014-04-10 MED ORDER — AMIODARONE HCL 200 MG PO TABS: 200.0000 mg | ORAL_TABLET | Freq: Every day | ORAL | Status: DC

## 2014-04-10 MED ORDER — MIDAZOLAM HCL 5 MG/5ML IJ SOLN
INTRAMUSCULAR | Status: AC
  Filled 2014-04-10: qty 10

## 2014-04-10 NOTE — OR Nursing (Signed)
Patient to pre-op for colonoscopy. Patient's oxygen saturation 89-90% on 4-5 liters via nasal cannula. Applied non-rebreather and oxygen saturation up to 93-95%. Dr. Gala Romney aware.

## 2014-04-10 NOTE — Progress Notes (Signed)
Colonoscopy planned today. After declining Golytely prep previously, a modified Miralax prep has been completed. Spoke with Mearl Latin, RN, BSN, primary nurse for patient. Running clear now. Proceed with colonoscopy as planned.

## 2014-04-10 NOTE — Discharge Summary (Signed)
Physician Discharge Summary  Patient ID: Alexis Dillon MRN: [id number] DOB/AGE: July 01, 1945 68 y.o. Primary Care Physician:Cierah Crader L, MD Admit date: 04/04/2014 Discharge date: 04/10/2014    Discharge Diagnoses:   Active Problems:   LOW BACK PAIN   CHF (congestive heart failure)   COPD (chronic obstructive pulmonary disease)   Tobacco abuse   Hypertension   Mental retardation, idiopathic mild   Acute respiratory failure   Acute systolic CHF (congestive heart failure)   CKD (chronic kidney disease), stage III   Atrial fibrillation   Cardiomyopathy, ischemic   Anemia   Chronic anticoagulation   GI bleed   Heme positive stool   Symptomatic anemia   Acute on chronic systolic congestive heart failure   IDA (iron deficiency anemia)   CKD (chronic kidney disease) stage 4, GFR 15-29 ml/min   Severe left ventricular systolic dysfunction   Hyperlipidemia   Other emphysema     Medication List    STOP taking these medications        apixaban 2.5 MG Tabs tablet  Commonly known as:  ELIQUIS     aspirin 81 MG EC tablet     ibuprofen 200 MG tablet  Commonly known as:  ADVIL,MOTRIN     ipratropium 17 MCG/ACT inhaler  Commonly known as:  ATROVENT HFA     Linaclotide 145 MCG Caps capsule  Commonly known as:  LINZESS     polyethylene glycol-electrolytes 420 G solution  Commonly known as:  TRILYTE      TAKE these medications        acetaminophen 325 MG tablet  Commonly known as:  TYLENOL  Take 2 tablets (650 mg total) by mouth every 6 (six) hours as needed for mild pain or fever.     albuterol (2.5 MG/3ML) 0.083% nebulizer solution  Commonly known as:  PROVENTIL  Take 3 mLs (2.5 mg total) by nebulization every 2 (two) hours as needed for wheezing or shortness of breath.     ALPRAZolam 0.25 MG tablet  Commonly known as:  XANAX  Take 0.25 mg by mouth 3 (three) times daily as needed for anxiety.     amiodarone 200 MG tablet  Commonly known as:  PACERONE  Take 1  tablet (200 mg total) by mouth daily.     atorvastatin 80 MG tablet  Commonly known as:  LIPITOR  Take 80 mg by mouth daily.     guaiFENesin 600 MG 12 hr tablet  Commonly known as:  MUCINEX  Take 2 tablets (1,200 mg total) by mouth 2 (two) times daily.     HYDROcodone-acetaminophen 5-325 MG per tablet  Commonly known as:  NORCO/VICODIN  Take 1 tablet by mouth every 4 (four) hours as needed for moderate pain.     ipratropium-albuterol 0.5-2.5 (3) MG/3ML Soln  Commonly known as:  DUONEB  Take 3 mLs by nebulization every 6 (six) hours as needed (SHORTNESS OF BREATH).     pantoprazole 40 MG tablet  Commonly known as:  PROTONIX  Take 1 tablet (40 mg total) by mouth 2 (two) times daily.     predniSONE 10 MG tablet  Commonly known as:  DELTASONE  Take 4 tablets (40 mg total) by mouth daily with breakfast.     sodium chloride 0.65 % Soln nasal spray  Commonly known as:  OCEAN  Place 1 spray into both nostrils as needed for congestion.        Discharged Condition: Improved    Consults: GI/cardiology  Significant Diagnostic Studies: Dg Abd  1 View  04/08/2014   CLINICAL DATA:  68 year old female with excessive belching. History of a recent Agile capsule study.  EXAM: ABDOMEN - 1 VIEW  COMPARISON:  Most recent prior abdominal radiographs 04/06/2014.  FINDINGS: The bowel gas pattern is within normal limits. No evidence of obstruction. The radiopaque capsule previously noted overlying the rectum is no longer visible. Levo convex and rotary scoliosis centered at L2 remains unchanged. Multilevel degenerative disc disease. No evidence of large free air on this single supine radiograph. Surgical changes of prior right hip total arthroplasty are incompletely imaged. No evidence of complication involving the visualized portion. Atherosclerotic vascular calcifications are again noted.  IMPRESSION: 1. Unremarkable bowel gas pattern. 2. Agile endoscopy capsule is no longer visible.    Electronically Signed   By: Jacqulynn Cadet M.D.   On: 04/08/2014 11:56   Dg Abd 1 View  04/06/2014   CLINICAL DATA:  Followup endoscopy capsule from 04/05/2014  EXAM: ABDOMEN - 1 VIEW  COMPARISON:  None.  FINDINGS: There is a capsule projecting over the midline of the pelvis. It is difficult to determine exact location. This could be within the rectosigmoid colon or overlying small bowel loops.  Nonobstructive bowel gas pattern. No free air organomegaly. Scarring in the lung bases. Prior right hip replacement.  IMPRESSION: Endoscopy capsule projects over the midline of the pelvis. This could be within the rectosigmoid colon or overlying small bowel loops.   Electronically Signed   By: Rolm Baptise M.D.   On: 04/06/2014 15:49   Dg Chest Portable 1 View  04/04/2014   CLINICAL DATA:  Cough.  EXAM: PORTABLE CHEST - 1 VIEW  COMPARISON:  05/11/2013  FINDINGS: There is new cardiomegaly with pulmonary vascular congestion and mild interstitial pulmonary edema bilaterally. Previous median sternotomy. No acute osseous abnormality.  IMPRESSION: Congestive heart failure.   Electronically Signed   By: Rozetta Nunnery M.D.   On: 04/04/2014 13:55    Lab Results: Basic Metabolic Panel: No results for input(s): NA, K, CL, CO2, GLUCOSE, BUN, CREATININE, CALCIUM, MG, PHOS in the last 72 hours. Liver Function Tests: No results for input(s): AST, ALT, ALKPHOS, BILITOT, PROT, ALBUMIN in the last 72 hours.   CBC: No results for input(s): WBC, NEUTROABS, HGB, HCT, MCV, PLT in the last 72 hours.  Recent Results (from the past 240 hour(s))  MRSA PCR Screening     Status: None   Collection Time: 04/04/14  5:25 PM  Result Value Ref Range Status   MRSA by PCR NEGATIVE NEGATIVE Final    Comment:        The GeneXpert MRSA Assay (FDA approved for NASAL specimens only), is one component of a comprehensive MRSA colonization surveillance program. It is not intended to diagnose MRSA infection nor to guide or monitor  treatment for MRSA infections.      Hospital Course: This is a 68 year old with multiple medical problems who came to the emergency department because of shortness of breath. She was seen in the emergency department have a hemoglobin that was around 6 and she had what appeared to be acute heart failure. She was treated with blood transfusions given Lasix and improved. She has multiple other medical problems were still being treated. She had been fully anticoagulated with Eliquis so she could not undergo colonoscopy immediately. She had trouble doing her preparation for colonoscopy but this was done on the 25th. Her heart failure seemed to be well controlled by that time. She was back in approximately baseline but  she was weak and it was felt that she was going to need rehabilitation in a skilled care facility  Discharge Exam: Blood pressure 120/74, pulse 74, temperature 98.4 F (36.9 C), temperature source Oral, resp. rate 18, height 5\' 5"  (1.651 m), weight 46.312 kg (102 lb 1.6 oz), SpO2 89 %. She is awake and alert. She is in atrial fibrillation. Her chest is fairly clear.  Disposition: To skilled care facility. In addition to the medications listed above she will need Lasix 40 mg by mouth daily. She will be on low-salt diet. She has intermittently been on oxygen and this should be provided at the skilled care facility until we can assess how her oxygen saturation does with exercise. She will have physical therapy occupational therapy and speech therapy if needed      Signed: Tymeer Vaquera L   04/10/2014, 8:47 AM

## 2014-04-10 NOTE — H&P (View-Only) (Signed)
Colonoscopy planned today. After declining Golytely prep previously, a modified Miralax prep has been completed. Spoke with Mearl Latin, RN, BSN, primary nurse for patient. Running clear now. Proceed with colonoscopy as planned.

## 2014-04-10 NOTE — Interval H&P Note (Signed)
History and Physical Interval Note:  04/10/2014 2:28 PM  Alexis Dillon  has presented today for surgery, with the diagnosis of anemia, history of polyps with need for surveillance  The various methods of treatment have been discussed with the patient and family. After consideration of risks, benefits and other options for treatment, the patient has consented to  Procedure(s): COLONOSCOPY (N/A) as a surgical intervention .  The patient's history has been reviewed, patient examined, no change in status, stable for surgery.  I have reviewed the patient's chart and labs.  Questions were answered to the patient's satisfaction.     Alexis Dillon  No change. Colonoscopy per plan.The risks, benefits, limitations, alternatives and imponderables have been reviewed with the patient. Questions have been answered. All parties are agreeable.

## 2014-04-10 NOTE — Progress Notes (Signed)
UR chart review completed.  

## 2014-04-10 NOTE — Telephone Encounter (Signed)
Pt is scheduled for 04/16/2014 for her Givens study. Spoke with Melissa at [facility] and she said that no PA is required for this test.

## 2014-04-10 NOTE — Op Note (Signed)
[facility] [address] [place], [address]   COLONOSCOPY PROCEDURE REPORT  PATIENT: Alexis Dillon, Alexis Dillon  MR#: [id number] BIRTHDATE: 1945-09-25 , 68  yrs. old GENDER: female ENDOSCOPIST: R.  Garfield Cornea, MD FACP [facility] REFERRED AL:PFXTKW Luan Pulling, M.D. PROCEDURE DATE:  2014/05/06 PROCEDURE: INDICATIONS:anemia; GI bleed; history of multiple colonic polyps piecemeal removed earlier this year. MEDICATIONS: Versed 2 mg IV and Demerol 25 mg IV in divided doses. Xylocaine gel orally.  Zofran 4 mg IV. ASA CLASS:       Class III  CONSENT: The risks, benefits, alternatives and imponderables including but not limited to bleeding, perforation as well as the possibility of a missed lesion have been reviewed.  The potential for biopsy, lesion removal, etc. have also been discussed. Questions have been answered.  All parties agreeable.  Please see the history and physical in the medical record for more information.  DESCRIPTION OF PROCEDURE:   After the risks benefits and alternatives of the procedure were thoroughly explained, informed consent was obtained.  The digital rectal exam revealed no abnormalities of the rectum.   The [id number] ([id number])  endoscope was introduced through the anus and advanced to the [facility] descending segment.s]   The quality of the prep was inadequate The instrument was then slowly withdrawn as the colon was fully examined.      COLON FINDINGS: Inadequate preparation precluded complete examination.  Stool elements in the lumen of the rectum and sigmoid precluded complete examination.  Patient had numerous sigmoid diverticula.  There was no blood in the lower GI tract.  Because of the inadequate prep exam was terminated.  Patient tolerated well, however.  Retroflexion was not performed. .  Withdrawal time=  . not applicable. The scope was withdrawn and the procedure completed. COMPLICATIONS: There were no immediate complications.  ENDOSCOPIC  IMPRESSION: Attempted but incomplete colonoscopy due to poor preparation. We have struggled the past couple of days (as the patient has) in attempting to get her prepped; this lady is worn out at this time and does not want further preparation attempts.. I do not think it would be productive to try to push her with any more prep at this time. The main reason for colonoscopy at this time is to follow-up on the piecemeal removal of polyps from earlier this year. Realistically, she may have suffered a diverticular bleed, but there is no evidence of bleeding at this time.  Her small intestine needs evaluation.  RECOMMENDATIONS: Discussed with Dr. Luan Pulling and the patients relative, Vladimir Creeks. I agree with Dr. Luan Pulling in terms of holding anticoagulation for 2 weeks. Would continue  PPI therapy.  Will set her up for a capsule study for small intestine next week. From a GI standpoint, she can probably be discharged later today. Will advance diet. Will regroup and tentatively plan for a surveillance colonoscopy in January 2016.  Of course, should she develop recurrent bleeding plans may change.  eSigned:  R. Garfield Cornea, MD Rosalita Chessman [facility] 05/06/14 3:27 PM   cc:  CPT CODES: ICD CODES:  The ICD and CPT codes recommended by this software are interpretations from the data that the clinical staff has captured with the software.  The verification of the translation of this report to the ICD and CPT codes and modifiers is the sole responsibility of the health care institution and practicing physician where this report was generated.  [place]. will not be held responsible for the validity of the ICD and CPT codes included on this  report.  AMA assumes no liability for data contained or not contained herein. CPT is a Designer, television/film set of the [employer].  PATIENT NAME:  Alexis Dillon MR#: [id number]

## 2014-04-10 NOTE — Clinical Social Work Note (Signed)
Pt to d/c later this afternoon to [facility]. Pt, pt's sister-in-law [name], and facility aware and agreeable. D/C summary faxed. Pt to transfer with family.  Benay Pike, [place]

## 2014-04-10 NOTE — Clinical Social Work Placement (Signed)
[facility] NOTE 04/10/2014  Patient:  Alexis Dillon, Alexis Dillon  Account Number:  [id number] [place] date:  04/04/2014  Clinical Social Worker:  Benay Pike, LCSW  Date/time:  04/08/2014 02:01 PM  Clinical Social Work is seeking post-discharge placement for this patient at the following level of care:   SKILLED NURSING   (*CSW will update this form in Epic as items are completed)   04/08/2014  Patient/family provided with [place] Department of Clinical Social Work's list of facilities offering this level of care within the geographic area requested by the patient (or if unable, by the patient's family).  04/08/2014  Patient/family informed of their freedom to choose among providers that offer the needed level of care, that participate in Medicare, Medicaid or managed care program needed by the patient, have an available bed and are willing to accept the patient.  04/08/2014  Patient/family informed of [facility]' ownership interest in [facility], as well as of the fact that they are under no obligation to receive care at this facility.  PASARR submitted to EDS on  PASARR number received on   FL2 transmitted to all facilities in geographic area requested by pt/family on  04/08/2014 FL2 transmitted to all facilities within larger geographic area on   Patient informed that his/her managed care company has contracts with or will negotiate with  certain facilities, including the following:     Patient/family informed of bed offers received:  04/09/2014 Patient chooses bed at [place] Physician recommends and patient chooses bed at  [facility]  Patient to be transferred to [facility] on  04/10/2014 Patient to be transferred to facility by family Patient and family notified of transfer on 04/10/2014 Name of family member notified:  [name]- sister-in-law  The following physician request were entered in  Epic:   Additional Comments: Pt has existing pasarr.  Benay Pike, [place]

## 2014-04-10 NOTE — Progress Notes (Signed)
Subjective: She is now ready for colonoscopy. She has no new complaints. She says she had multiple bowel movements last night  Objective: Vital signs in last 24 hours: Temp:  [97.8 F (36.6 C)-98.4 F (36.9 C)] 98.4 F (36.9 C) (11/25 0635) Pulse Rate:  [70-86] 74 (11/25 0635) Resp:  [18] 18 (11/25 0635) BP: (112-120)/(65-74) 120/74 mmHg (11/25 0635) SpO2:  [85 %-93 %] 89 % (11/25 0725) Weight:  [46.312 kg (102 lb 1.6 oz)] 46.312 kg (102 lb 1.6 oz) (11/25 4270) Weight change: -1.71 kg (-3 lb 12.3 oz) Last BM Date: 04/08/14  Intake/Output from previous day: 11/24 0701 - 11/25 0700 In: 100 [P.O.:100] Out: 500 [Urine:500]  PHYSICAL EXAM General appearance: alert and mild distress Resp: rhonchi bilaterally Cardio: irregularly irregular rhythm GI: soft, non-tender; bowel sounds normal; no masses,  no organomegaly Extremities: extremities normal, atraumatic, no cyanosis or edema  Lab Results:  No results found for this or any previous visit (from the past 48 hour(s)).  ABGS No results for input(s): PHART, PO2ART, TCO2, HCO3 in the last 72 hours.  Invalid input(s): PCO2 CULTURES Recent Results (from the past 240 hour(s))  MRSA PCR Screening     Status: None   Collection Time: 04/04/14  5:25 PM  Result Value Ref Range Status   MRSA by PCR NEGATIVE NEGATIVE Final    Comment:        The GeneXpert MRSA Assay (FDA approved for NASAL specimens only), is one component of a comprehensive MRSA colonization surveillance program. It is not intended to diagnose MRSA infection nor to guide or monitor treatment for MRSA infections.    Studies/Results: Dg Abd 1 View  04/08/2014   CLINICAL DATA:  68 year old female with excessive belching. History of a recent Agile capsule study.  EXAM: ABDOMEN - 1 VIEW  COMPARISON:  Most recent prior abdominal radiographs 04/06/2014.  FINDINGS: The bowel gas pattern is within normal limits. No evidence of obstruction. The radiopaque capsule  previously noted overlying the rectum is no longer visible. Levo convex and rotary scoliosis centered at L2 remains unchanged. Multilevel degenerative disc disease. No evidence of large free air on this single supine radiograph. Surgical changes of prior right hip total arthroplasty are incompletely imaged. No evidence of complication involving the visualized portion. Atherosclerotic vascular calcifications are again noted.  IMPRESSION: 1. Unremarkable bowel gas pattern. 2. Agile endoscopy capsule is no longer visible.   Electronically Signed   By: Jacqulynn Cadet M.D.   On: 04/08/2014 11:56    Medications:  Prior to Admission:  Prescriptions prior to admission  Medication Sig Dispense Refill Last Dose  . acetaminophen (TYLENOL) 325 MG tablet Take 2 tablets (650 mg total) by mouth every 6 (six) hours as needed for mild pain or fever.   04/04/2014  . albuterol (PROVENTIL) (2.5 MG/3ML) 0.083% nebulizer solution Take 2.5 mg by nebulization every 6 (six) hours as needed for wheezing or shortness of breath.   UNKNOWN  . ALPRAZolam (XANAX) 0.25 MG tablet Take 0.25 mg by mouth 3 (three) times daily as needed for anxiety.    04/04/2014  . amiodarone (PACERONE) 200 MG tablet Continue 200 mg twice a day until 05/31/13. Then decrease to 200 mg daily. (Patient taking differently: Take 200 mg by mouth daily. ) 60 tablet 1 04/04/2014  . apixaban (ELIQUIS) 2.5 MG TABS tablet Take 1 tablet (2.5 mg total) by mouth 2 (two) times daily. 60 tablet 0 04/04/2014  . aspirin EC 81 MG EC tablet Take 1 tablet (81 mg  total) by mouth daily.   04/04/2014  . atorvastatin (LIPITOR) 80 MG tablet Take 80 mg by mouth daily.   04/04/2014  . HYDROcodone-acetaminophen (NORCO/VICODIN) 5-325 MG per tablet Take 1 tablet by mouth every 4 (four) hours as needed for moderate pain.    Past Week at Unknown time  . ibuprofen (ADVIL,MOTRIN) 200 MG tablet Take 400 mg by mouth every 6 (six) hours as needed for headache.   04/04/2014  . ipratropium  (ATROVENT HFA) 17 MCG/ACT inhaler Inhale 1 puff into the lungs every 4 (four) hours as needed for wheezing.    04/04/2014  . ipratropium-albuterol (DUONEB) 0.5-2.5 (3) MG/3ML SOLN Take 3 mLs by nebulization every 6 (six) hours as needed (SHORTNESS OF BREATH).   04/04/2014 at 1100  . pantoprazole (PROTONIX) 40 MG tablet Take 40 mg by mouth daily.   04/04/2014  . predniSONE (DELTASONE) 10 MG tablet Take 10 mg by mouth daily with breakfast.   04/04/2014  . Linaclotide (LINZESS) 145 MCG CAPS capsule Take 1 capsule (145 mcg total) by mouth daily. Take 30 minutes prior to breakfast. (Patient not taking: Reported on 04/04/2014) 30 capsule 5 Unknown at Unknown time  . polyethylene glycol-electrolytes (TRILYTE) 420 G solution Take 4,000 mLs by mouth as directed. (Patient not taking: Reported on 04/04/2014) 4000 mL 0 10/24/2013 at 1800   Scheduled: . amiodarone  200 mg Oral Daily  . antiseptic oral rinse  7 mL Mouth Rinse BID  . atorvastatin  80 mg Oral q1800  . furosemide  40 mg Intravenous BID  . guaiFENesin  1,200 mg Oral BID  . ipratropium-albuterol  3 mL Nebulization TID  . pantoprazole  40 mg Oral BID AC  . predniSONE  40 mg Oral Q breakfast  . sodium chloride  3 mL Intravenous Q12H   Continuous:  RCV:ELFYBO chloride, acetaminophen, albuterol, ALPRAZolam, HYDROcodone-acetaminophen, ondansetron (ZOFRAN) IV, sodium chloride, sodium chloride  Assesment: She was admitted with acute on chronic systolic congestive heart failure probably related to anemia. She had heme positive stool but it's not clear that she had an acute GI bleed. She has had blood transfusion and her hemoglobin is up around 10 now. In addition to the problems with anemia she has COPD and is still smoking, chronic kidney disease stage 3-4, ischemic cardiomyopathy peripheral arterial disease chronic anticoagulation which has been held and mild mental retardation Active Problems:   LOW BACK PAIN   CHF (congestive heart failure)   COPD  (chronic obstructive pulmonary disease)   Tobacco abuse   Hypertension   Mental retardation, idiopathic mild   Acute respiratory failure   Acute systolic CHF (congestive heart failure)   CKD (chronic kidney disease), stage III   Atrial fibrillation   Cardiomyopathy, ischemic   Anemia   Chronic anticoagulation   GI bleed   Heme positive stool   Symptomatic anemia   Acute on chronic systolic congestive heart failure   IDA (iron deficiency anemia)   CKD (chronic kidney disease) stage 4, GFR 15-29 ml/min   Severe left ventricular systolic dysfunction   Hyperlipidemia   Other emphysema    Plan: She is to have colonoscopy today. I noted that she had not had laboratory work since the 22nd so I ordered CBC and basic metabolic profile but apparently they have already been ordered and are in progress. There is some possibility that she could go to the nursing home after her colonoscopy depending on how she does    LOS: 6 days   Renda Pohlman L 04/10/2014, 8:34  AM

## 2014-04-10 NOTE — Telephone Encounter (Signed)
Per RMR the patient had an incomplete tcs today as an inpatient.  She will be discharged back to [facility] this weekend.  She needs a givens capsule study next week Re:Gi Bleed.  Routing to [facility] clinical pool to schedule and relau results to the nursing home

## 2014-04-15 ENCOUNTER — Encounter (HOSPITAL_COMMUNITY): Payer: Self-pay | Admitting: Internal Medicine

## 2014-04-17 ENCOUNTER — Encounter (HOSPITAL_COMMUNITY): Payer: Self-pay | Admitting: *Deleted

## 2014-04-17 ENCOUNTER — Ambulatory Visit (HOSPITAL_COMMUNITY)
Admission: RE | Admit: 2014-04-17 | Discharge: 2014-04-17 | Disposition: A | Discharge status: Home / Self Care | Payer: PRIVATE HEALTH INSURANCE | Source: Ambulatory Visit | Attending: Internal Medicine | Admitting: Internal Medicine

## 2014-04-17 ENCOUNTER — Encounter (HOSPITAL_COMMUNITY)
Admission: RE | Disposition: A | Discharge status: Home / Self Care | Payer: Self-pay | Source: Ambulatory Visit | Attending: Internal Medicine

## 2014-04-17 DIAGNOSIS — K5520 Angiodysplasia of colon without hemorrhage: Secondary | ICD-10-CM | POA: Diagnosis not present

## 2014-04-17 DIAGNOSIS — Z86010 Personal history of colonic polyps: Secondary | ICD-10-CM | POA: Insufficient documentation

## 2014-04-17 DIAGNOSIS — K2270 Barrett's esophagus without dysplasia: Secondary | ICD-10-CM | POA: Insufficient documentation

## 2014-04-17 DIAGNOSIS — K219 Gastro-esophageal reflux disease without esophagitis: Secondary | ICD-10-CM | POA: Diagnosis not present

## 2014-04-17 DIAGNOSIS — K922 Gastrointestinal hemorrhage, unspecified: Secondary | ICD-10-CM

## 2014-04-17 HISTORY — PX: GIVENS CAPSULE STUDY: SHX5432

## 2014-04-17 SURGERY — IMAGING PROCEDURE, GI TRACT, INTRALUMINAL, VIA CAPSULE

## 2014-04-17 NOTE — Progress Notes (Signed)
Patient ingested Givens Capsule at 7:40 without difficulty.  Procedure and rationale explained to caregiver and patient.  Staff will bring equipment back at 3:40 for download.

## 2014-04-18 ENCOUNTER — Encounter (HOSPITAL_COMMUNITY): Payer: Self-pay | Admitting: Internal Medicine

## 2014-04-18 NOTE — Op Note (Signed)
@  LOGO@     Small Bowel Givens Capsule Study Procedure date:  04/17/2014  Referring Provider:  Dr. Sinda Du  PCP:  Dr. Alonza Bogus, MD  Indication for procedure:  GI bleed of obscure etiology   Findings:   Prolonged esophageal transit of greater than 1 hour. Couple of small bowel AVMs; few areas of vascular blush present along with lymphangiectasia.  Single nonbleeding small bowel erosion. Suggestion of a small bowel "diaphragm" holding up passage of the capsule transiently.  No tumor seen.  First Gastric image:  1 hour 8 minutes 43 seconds First Duodenal image: 4 hours 29 minutes 11 seconds First Ileo-Cecal Valve image: 7 hours 40 minutes 37 seconds First Cecal image: 7 hours 40 seconds 54 seconds Gastric Passage time: 3 hours 20 minutes Small Bowel Passage time: 3 hours 11 minutes  Summary & Recommendations:  Patient could have bled from small bowel (AVM/erosions) in the setting of anticoagulation and aspirin use. No tumor seen.  May have some NSAID damage ( "diaphragm) of the small bowel impeding transit of capsule temporarily.   Continue proton pump inhibitor therapy daily due to GERD (Barrett's) and for gastric mucosal protection in the case that aspirin is continued. Would resume anticoagulation only if felt to be clinically beneficial.  Swallowing precautions recommended (i.e. swallow pills and food with adequate liquids and remain upright for 30 minutes after ingestion  -  given slow esophageal transit noted)  We'll plan to have this nice lady return to see [place] in January to contemplate completing a colonoscopy given history of polyps.

## 2014-04-22 ENCOUNTER — Ambulatory Visit (HOSPITAL_COMMUNITY)
Admission: RE | Admit: 2014-04-22 | Discharge: 2014-04-22 | Disposition: A | Discharge status: Home / Self Care | Payer: PRIVATE HEALTH INSURANCE | Source: Ambulatory Visit | Attending: Internal Medicine | Admitting: Internal Medicine

## 2014-04-22 DIAGNOSIS — Z452 Encounter for adjustment and management of vascular access device: Secondary | ICD-10-CM | POA: Insufficient documentation

## 2014-04-22 MED ORDER — SODIUM CHLORIDE 0.9 % IJ SOLN: 10.0000 mL | Freq: Two times a day (BID) | INTRAMUSCULAR | Status: DC

## 2014-04-22 MED ORDER — SODIUM CHLORIDE 0.9 % IJ SOLN
10.0000 mL | INTRAMUSCULAR | Status: DC | PRN
  Administered 2014-04-22: 20 mL
  Filled 2014-04-22: qty 40

## 2014-04-22 NOTE — Discharge Instructions (Signed)
PICC Removal A peripherally inserted central catheter (PICC) is a long, thin, flexible tube that a health care provider can insert into a vein in your upper arm. It is a type of IV. Having a PICC in place gives health care providers quick access to your veins. It is a good way to distribute medicines and fluids quickly throughout your body. LET [facility] CARE PROVIDER KNOW ABOUT:  Any allergies you have.  All medicines you are taking, including vitamins, herbs, eye drops, creams, and over-the-counter medicines.  Previous problems you or members of your family have had with the use of anesthetics.  Any blood disorders you have.  Previous surgeries you have had.  Medical conditions you have. RISKS AND COMPLICATIONS Generally, this is a safe procedure. However, as with any procedure, problems can occur. Possible problems include:  Bleeding.  Infection. BEFORE THE PROCEDURE You need an order from your health care provider to have your PICC removed. Only a health care provider trained in PICC removal should take it out.You may have your PICC removed in the hospital or in an outpatient setting. PROCEDURE Having a PICC removed is usually painless. Removal of the tape that holds the PICC in place may be the most uncomfortable part. Do not take out the PICC yourself. Only a trained clinical professional, such as a PICC nurse, should remove it. If your health care provider thinks your PICC is infected, the tip may be sent to the lab for testing. After taking out your PICC, your health care provider may:   Hold gentle pressure on the exit site.  Apply some antibiotic ointment.  Place a small bandage over the insertion site. AFTER THE PROCEDURE You should be able to remove the bandage after 24 hours. Follow all your health care provider's instructions.   Keep the insertion site clean by washing it gently with soap and water.  Do not pick or remove a scab.  Avoid strenuous physical  activity for a day or two.  Let your health care provider know if you develop redness, soreness, bleeding, swelling, or drainage from the insertion site.  Let your health care provider know if you develop chills or fever. Document Released: 10/21/2009 Document Revised: 05/08/2013 Document Reviewed: 02/23/2013 [facility] Patient Information 2015 [place], [place]. This information is not intended to replace advice given to you by your health care provider. Make sure you discuss any questions you have with your health care provider.  Tunneled Central Venous Catheter Flushing Guide A tunneled central venous catheter should be flushed after medicines, intravenous fluids, or nutrition have been given. Flushing the catheter helps prevent blood and medicine residue from collecting in the catheter and clogging it. The following are instructions to flush your tunneled central venous catheter. Your caregiver may also give you additional flushing instructions.  GENERAL TUNNELED CENTRAL VENOUS CATHETER FLUSHING INFORMATION  If possible, another person should help you when flushing the catheter. Always wash your hands before touching or flushing the catheter.  Each access (lumen, port) of the catheter has a clamp. Open the clamp to flush the catheter or when infusions are being given. Keep the clamp closed when the catheter is not in use.  Do not use force to flush the catheter.  Do not use a small diameter syringe such as a 3 mL syringe to flush the catheter. Flushing the catheter with a small diameter syringe can burst the catheter. A 10 mL syringe should be used when flushing the catheter.  Do not use a sharp-edged  clamp (hemostat) to clamp the catheter.  If the catheter has heparin instilled in the line, the heparin should be removed before using the catheter. The heparin helps the catheter from becoming clogged when it is not used on a regular basis. WITHDRAWING HEPARIN FROM THE TUNNELED CENTRAL VENOUS  CATHETER  When the tunneled central venous catheter is not used for long periods of time, heparin may be injected into the catheter lumen(s) and left to "dwell" until it is used again.  If heparin has been instilled in the catheter, the heparin must be removed before it can be used. The following are steps to remove heparin from the tunneled central venous catheter:  Gather the appropriate supplies as told by your caregiver.  Wash your hands thoroughly with soap and water.  Put on clean gloves.  Vigorously scrub the injection cap for 15 seconds with an alcohol prep pad.  Unclamp the catheter.  Attach an empty 10 mL syringe to the injection cap. Pull back on the syringe plunger and withdraw 10 milliliters of blood. This is considered a "waste" withdrawal. Dispose the "waste" syringe into an appropriate waste container.  Rescrub the injection cap for 15 seconds with an alcohol prep pad.  Attach a prefilled 10 mL normal saline syringe to the injection cap. Flush the catheter with normal saline to clear away the blood in the catheter. The catheter can now be used for medicines, intravenous fluids, and nutrition.  Follow your caregiver's advice regarding when the tunneled central venous catheter should be flushed with heparin. FLUSHING THE TUNNELED CENTRAL VENOUS CATHETER WITH NORMAL SALINE   Normal saline is used to flush the catheter before and after medicines, intravenous fluids, and nutrition are given. It is important to check for a blood return before using the catheter. A blood return is checked to ensure the catheter is open (patent) and is not clogged. The following are instructions to check for a blood return and flush the catheter with normal saline.  Gather the appropriate supplies to flush the catheter as instructed by your caregiver.  Wash your hands thoroughly with soap and water.  Put on clean gloves.  Vigorously scrub the injection cap for 15 seconds with an alcohol prep  pad.  Unclamp the catheter.  Attach a 10 mL prefilled normal saline syringe to the injection cap. Flush 5 milliliters of normal saline into the catheter. Pull back on the attached syringe until you see blood in the catheter. When blood is identified in the catheter, flush the catheter with the remaining normal saline.  The catheter can now be used for medicines, intravenous fluids, and nutrition.  After medicines, intravenous fluids, or nutrition have been given, be sure to flush the catheter with 10 milliliters of normal saline. TROUBLESHOOTING THE TUNNELED CENTRAL VENOUS CATHETER  If you cannot flush the catheter, check to make sure the catheter is unclamped.  The injection cap may need to be changed if blood cannot be completely cleared from the cap or if there is a crack in the injection cap.  If the catheter is difficult to flush, flush the catheter with 10 milliliters of normal saline using a "pulsing" flush method.  If you do not see blood when you pull back the syringe plunger, change your body position. This can include raising your arms above your head or taking a deep breath and cough. Pull back on the plunger again. If there is still no blood return, flush with a small amount of saline and change positions, take  a deep breath, or cough again. Pull back on the plunger again. If there is still no blood return, finish injecting the normal saline. Disconnect the syringe and clamp the catheter.  If you cannot get a blood return or the catheter continues to be difficult to flush, it is very important to contact your caregiver. The catheter may be clogged. Your caregiver can troubleshoot and fix problems with the catheter. SEEK IMMEDIATE MEDICAL CARE IF:  You notice redness, swelling, pain, or have any type of drainage at or around the catheter insertion site.  Your catheter is difficult to flush, or you cannot flush the catheter.  You cannot get a blood return from the  catheter.  Your catheter leaks when flushed or when fluids are infused into it.  There are cracks or breaks in the catheter.  You have swelling on the arm nearest to the catheter.  You have an unexplained fever. Document Released: 04/22/2011 Document Revised: 07/26/2011 Document Reviewed: 04/22/2011 [facility] Patient Information 2015 [place], [place]. This information is not intended to replace advice given to you by your health care provider. Make sure you discuss any questions you have with your health care provider.  Catheter-Associated Bloodstream Infections FAQs WHAT IS A CATHETER-ASSOCIATED BLOODSTREAM INFECTION?  A "central line" or "central catheter" is a tube that is placed into a patient's large vein, usually in the neck, chest, arm, or groin. The catheter is often used to draw blood, or give fluids or medications. It may be left in place for several weeks. A bloodstream infection can occur when bacteria or other germs travel down a "central line" and enter the blood. If you develop a catheter-associated bloodstream infection you may become ill with fevers and chills or the skin around the catheter may become sore and red. CAN A CATHETER-RELATED BLOODSTREAM INFECTION BE TREATED? A catheter-associated bloodstream infection is serious, but often can be successfully treated with antibiotics. The catheter might need to be removed if you develop an infection. WHAT ARE SOME OF THE THINGS THAT HOSPITALS ARE DOING TO PREVENT CATHETER-ASSOCIATED BLOODSTREAM INFECTIONS? To prevent catheter-associated bloodstream infections doctors and nurses will:  Choose a vein where the catheter can be safely inserted and where the risk for infection is small.  Clean their hands with soap and water or an alcohol-based hand rub before putting in the catheter.  Wear a mask, cap, sterile gown, and sterile gloves when putting in the catheter to keep it sterile. The patient will be covered with a sterile  sheet.  Clean the patient's skin with an antiseptic cleanser before putting in the catheter.  Clean their hands, wear gloves, and clean the catheter opening with an antiseptic solution before using the catheter to draw blood or give medications. Healthcare providers also clean their hands and wear gloves when changing the bandage that covers the area where the catheter enters the skin.  Decide every day if the patient still needs to have the catheter. The catheter will be removed as soon as it is no longer needed.  Carefully handle medications and fluids that are given through the catheter. WHAT CAN I DO TO HELP PREVENT A CATHETER-ASSOCIATED BLOODSTREAM INFECTION?   Ask your doctors and nurses to explain why you need the catheter and how long you will have it.  Ask your doctors and nurses if they will be using all of the prevention methods discussed above.  Make sure that all doctors and nurses caring for you clean their hands with soap and water or an alcohol-based  hand rub before and after caring for you.  If you do not see your providers clean their hands, please ask them to do so.  If the bandage comes off or becomes wet or dirty, tell your nurse or doctor immediately.  Inform your nurse or doctor if the area around your catheter is sore or red.  Do not let family and friends who visit touch the catheter or the tubing.  Make sure family and friends clean their hands with soap and water or an alcohol-based hand rub before and after visiting you. WHAT DO I NEED TO DO WHEN I [place]? Some patients are sent home from the hospital with a catheter in order to continue their treatment. If you go home with a catheter, your doctors and nurses will explain everything you need to know about taking care of your catheter.  Make sure you understand how to care for the catheter before leaving the hospital. For example, ask for instructions on showering or bathing with the catheter  and how to change the catheter dressing.  Make sure you know who to contact if you have questions or problems after you get home.  Make sure you wash your hands with soap and water or an alcohol-based hand rub before handling your catheter.  Watch for the signs and symptoms of catheter-associated bloodstream infection, such as soreness or redness at the catheter site or fever, and call your healthcare provider immediately if any occur. If you have questions, please ask your doctor or nurse. Developed and co-sponsored by [employer] for [place] [phone]); Infectious Diseases Society of [place] (IDSA); [employer]; Association for Professionals in Infection Control and Epidemiology (APIC); Center for Disease Control (CDC); and The Joint Commission Document Released: 08/28/2010 Document Revised: 01/26/2012 Document Reviewed: 07/09/2013 [facility] Patient Information 2015 [place], [place]. This information is not intended to replace advice given to you by your health care provider. Make sure you discuss any questions you have with your health care provider. Call for any problems with PICC [phone]

## 2014-04-22 NOTE — Progress Notes (Signed)
eripherally Inserted Central Catheter/Midline Placement  The IV Nurse has discussed with the patient and/or persons authorized to consent for the patient, the purpose of this procedure and the potential benefits and risks involved with this procedure.  The benefits include less needle sticks, lab draws from the catheter and patient may be discharged home with the catheter.  Risks include, but not limited to, infection, bleeding, blood clot (thrombus formation), and puncture of an artery; nerve damage and irregular heat beat.  Alternatives to this procedure were also discussed.  PICC/Midline Placement Documentation  PICC / Midline Single Lumen 96/22/29 PICC Right Basilic 38 cm 1 cm (Active)  Indication for Insertion or Continuance of Line Administration of hyperosmolar/irritating solutions (i.e. TPN, Vancomycin, etc.);Home intravenous therapies (PICC only) 04/22/2014 10:28 AM  Exposed Catheter (cm) 1 cm 04/22/2014 10:28 AM  Site Assessment Clean;Dry;Intact 04/22/2014 10:28 AM  Line Status Flushed;Saline locked;Capped (central line);Blood return noted 04/22/2014 10:28 AM  Dressing Type Transparent;Securing device 04/22/2014 10:28 AM  Dressing Status Clean;Dry;Intact;Antimicrobial disc in place 04/22/2014 10:28 AM  Line Care Connections checked and tightened 04/22/2014 10:28 AM  Dressing Intervention New dressing 04/22/2014 10:28 AM  Dressing Change Due 04/29/14 04/22/2014 10:28 AM       Petra Kuba D 04/22/2014, 10:43 AM

## 2014-04-22 NOTE — Progress Notes (Signed)
PICC Line Insertion Procedure Note  Procedure: Insertion of #18fr  PICC  Indications:  Long Term IV therapy  Procedure Details  Informed consent was obtained for the procedure, including sedation.  Risks of lung perforation, hemorrhage, and adverse drug reaction were discussed.   Maximum sterile technique was used including antiseptics, cap, gloves, gown, hand hygiene, mask and sheet.  #80fr, PICC inserted to the R Basilic vein per hospital protocol.   Blood return:  yes  Findings: Catheter inserted to 38 cm, with 1 cm. Exposed. Mid upper arm circumference is  cm.  There were no changes to vital signs. Catheter was flushed with 20 cc NS. Patient did tolerate procedure well.  Recommendations: CXR ordered to verify placement. PICC Brochure given to patient with teaching instruction.

## 2014-04-25 ENCOUNTER — Encounter (HOSPITAL_COMMUNITY): Payer: Self-pay | Admitting: Cardiology

## 2014-05-16 ENCOUNTER — Ambulatory Visit (HOSPITAL_COMMUNITY)
Admission: RE | Admit: 2014-05-16 | Discharge: 2014-05-16 | Disposition: A | Discharge status: Home / Self Care | Payer: PRIVATE HEALTH INSURANCE | Source: Ambulatory Visit | Attending: Internal Medicine | Admitting: Internal Medicine

## 2014-05-16 DIAGNOSIS — Z452 Encounter for adjustment and management of vascular access device: Secondary | ICD-10-CM | POA: Insufficient documentation

## 2014-05-16 MED ORDER — SODIUM CHLORIDE 0.9 % IJ SOLN: 10.0000 mL | Freq: Two times a day (BID) | INTRAMUSCULAR | Status: DC

## 2014-05-16 MED ORDER — SODIUM CHLORIDE 0.9 % IJ SOLN: 10.0000 mL | INTRAMUSCULAR | Status: DC | PRN

## 2014-05-16 NOTE — Discharge Instructions (Signed)
Peripherally Inserted Central Catheter/Midline Placement  The IV Nurse has discussed with the patient and/or persons authorized to consent for the patient, the purpose of this procedure and the potential benefits and risks involved with this procedure.  The benefits include less needle sticks, lab draws from the catheter and patient may be discharged home with the catheter.  Risks include, but not limited to, infection, bleeding, blood clot (thrombus formation), and puncture of an artery; nerve damage and irregular heat beat.  Alternatives to this procedure were also discussed.  PICC/Midline Placement DocumentatioPICC Insertion, Care After Refer to this sheet in the next few weeks. These instructions provide you with information on caring for yourself after your procedure. Your health care provider may also give you more specific instructions. Your treatment has been planned according to current medical practices, but problems sometimes occur. Call your health care provider if you have any problems or questions after your procedure. WHAT TO EXPECT AFTER THE PROCEDURE After your procedure, it is typical to have the following:  Mild discomfort at the insertion site. This should not last more than a day. HOME CARE INSTRUCTIONS  Rest at home for the remainder of the day after the procedure.  You may bend your arm and move it freely. If your PICC is near or at the bend of your elbow, avoid activity with repeated motion at the elbow.  Avoid lifting heavy objects as instructed by your health care provider.  Avoid using a crutch with the arm on the same side as your PICC. You may need to use a walker. Bandage Care  Keep your PICC bandage (dressing) clean and dry to prevent infection.  Ask your health care provider when you may shower. To keep the dressing dry, cover the PICC with plastic wrap and tape before showering. If the dressing does become wet, replace it right after the shower.  Do not soak  in the bath, swim, or use hot tubs when you have a PICC.  Change the PICC dressing as instructed by your health care provider.  Change your PICC dressing if it becomes loose or wet. General PICC Care  Check the PICC insertion site daily for leakage, redness, swelling, or pain.  Flush the PICC as directed by your health care provider. Let your health care provider know right away if the PICC is difficult to flush or does not flush. Do not use force to flush the PICC.  Do not use a syringe that is less than 10 mL to flush the PICC.  Never pull or tug on the PICC.  Avoid blood pressure checks on the arm with the PICC.  Keep your PICC identification card with you at all times.  Do not take the PICC out yourself. Only a trained health care professional should remove the PICC. SEEK MEDICAL CARE IF:  You have pain in your arm, ear, face, or teeth.  You have fever or chills.  You have drainage from the PICC insertion site.  You have redness or palpate a "cord" around the PICC insertion site.  You cannot flush the catheter. SEEK IMMEDIATE MEDICAL CARE IF:  You have swelling in the arm in which the PICC is inserted. Document Released: 02/21/2013 Document Revised: 05/08/2013 Document Reviewed: 02/21/2013 [facility] Patient Information 2015 [place], [place]. This information is not intended to replace advice given to you by your health care provider. Make sure you discuss any questions you have with your health care provider.       Alexis Dillon  05/16/2014, 1:32 PM

## 2014-05-16 NOTE — Progress Notes (Signed)
Peripherally Inserted Central Catheter/Midline Placement  The IV Nurse has discussed with the patient and/or persons authorized to consent for the patient, the purpose of this procedure and the potential benefits and risks involved with this procedure.  The benefits include less needle sticks, lab draws from the catheter and patient may be discharged home with the catheter.  Risks include, but not limited to, infection, bleeding, blood clot (thrombus formation), and puncture of an artery; nerve damage and irregular heat beat.  Alternatives to this procedure were also discussed.      PICC / Midline Single Lumen 05/16/14 PICC Left Brachial 31 cm 0 cm (Active)  Indication for Insertion or Continuance of Line Prolonged intravenous therapies 05/16/2014  1:37 PM  Exposed Catheter (cm) 0 cm 05/16/2014  1:37 PM  Site Assessment Clean;Dry;Intact 05/16/2014  1:37 PM  Line Status Flushed;Saline locked;Capped (central line);Blood return noted 05/16/2014  1:37 PM  Dressing Type Transparent;Securing device 05/16/2014  1:37 PM  Dressing Status Clean;Dry;Intact;Antimicrobial disc in place 05/16/2014  1:37 PM  Line Care Connections checked and tightened 05/16/2014  1:37 PM  Dressing Intervention New dressing 05/16/2014  1:37 PM  Dressing Change Due 05/23/14 05/16/2014  1:37 PM       Hillery Jacks 05/16/2014, 1:42 PM

## 2014-05-17 DIAGNOSIS — Q211 Atrial septal defect: Secondary | ICD-10-CM | POA: Diagnosis not present

## 2014-05-17 DIAGNOSIS — D649 Anemia, unspecified: Secondary | ICD-10-CM | POA: Diagnosis not present

## 2014-05-17 DIAGNOSIS — I10 Essential (primary) hypertension: Secondary | ICD-10-CM | POA: Diagnosis not present

## 2014-05-17 DIAGNOSIS — E785 Hyperlipidemia, unspecified: Secondary | ICD-10-CM | POA: Diagnosis not present

## 2014-05-17 DIAGNOSIS — M545 Low back pain: Secondary | ICD-10-CM | POA: Diagnosis not present

## 2014-05-17 DIAGNOSIS — Z79899 Other long term (current) drug therapy: Secondary | ICD-10-CM | POA: Diagnosis not present

## 2014-05-17 DIAGNOSIS — I501 Left ventricular failure: Secondary | ICD-10-CM | POA: Diagnosis not present

## 2014-05-17 DIAGNOSIS — I5021 Acute systolic (congestive) heart failure: Secondary | ICD-10-CM | POA: Diagnosis not present

## 2014-05-17 DIAGNOSIS — I200 Unstable angina: Secondary | ICD-10-CM | POA: Diagnosis not present

## 2014-05-17 DIAGNOSIS — D72829 Elevated white blood cell count, unspecified: Secondary | ICD-10-CM | POA: Diagnosis not present

## 2014-05-17 DIAGNOSIS — J189 Pneumonia, unspecified organism: Secondary | ICD-10-CM | POA: Diagnosis not present

## 2014-05-17 DIAGNOSIS — F70 Mild intellectual disabilities: Secondary | ICD-10-CM | POA: Diagnosis not present

## 2014-05-17 DIAGNOSIS — S37009A Unspecified injury of unspecified kidney, initial encounter: Secondary | ICD-10-CM | POA: Diagnosis not present

## 2014-05-17 DIAGNOSIS — R41841 Cognitive communication deficit: Secondary | ICD-10-CM | POA: Diagnosis not present

## 2014-05-17 DIAGNOSIS — R262 Difficulty in walking, not elsewhere classified: Secondary | ICD-10-CM | POA: Diagnosis not present

## 2014-05-17 DIAGNOSIS — J690 Pneumonitis due to inhalation of food and vomit: Secondary | ICD-10-CM | POA: Diagnosis not present

## 2014-05-17 DIAGNOSIS — J449 Chronic obstructive pulmonary disease, unspecified: Secondary | ICD-10-CM | POA: Diagnosis not present

## 2014-05-17 DIAGNOSIS — N184 Chronic kidney disease, stage 4 (severe): Secondary | ICD-10-CM | POA: Diagnosis not present

## 2014-05-17 DIAGNOSIS — M1610 Unilateral primary osteoarthritis, unspecified hip: Secondary | ICD-10-CM | POA: Diagnosis not present

## 2014-05-17 DIAGNOSIS — R195 Other fecal abnormalities: Secondary | ICD-10-CM | POA: Diagnosis not present

## 2014-05-17 DIAGNOSIS — K922 Gastrointestinal hemorrhage, unspecified: Secondary | ICD-10-CM | POA: Diagnosis not present

## 2014-05-17 DIAGNOSIS — M6281 Muscle weakness (generalized): Secondary | ICD-10-CM | POA: Diagnosis not present

## 2014-05-17 DIAGNOSIS — F419 Anxiety disorder, unspecified: Secondary | ICD-10-CM | POA: Diagnosis not present

## 2014-05-17 DIAGNOSIS — K219 Gastro-esophageal reflux disease without esophagitis: Secondary | ICD-10-CM | POA: Diagnosis not present

## 2014-05-17 DIAGNOSIS — N183 Chronic kidney disease, stage 3 (moderate): Secondary | ICD-10-CM | POA: Diagnosis not present

## 2014-05-17 DIAGNOSIS — D509 Iron deficiency anemia, unspecified: Secondary | ICD-10-CM | POA: Diagnosis not present

## 2014-05-17 DIAGNOSIS — J439 Emphysema, unspecified: Secondary | ICD-10-CM | POA: Diagnosis not present

## 2014-05-17 DIAGNOSIS — M2550 Pain in unspecified joint: Secondary | ICD-10-CM | POA: Diagnosis not present

## 2014-05-17 DIAGNOSIS — I480 Paroxysmal atrial fibrillation: Secondary | ICD-10-CM | POA: Diagnosis not present

## 2014-05-17 DIAGNOSIS — J9600 Acute respiratory failure, unspecified whether with hypoxia or hypercapnia: Secondary | ICD-10-CM | POA: Diagnosis not present

## 2014-05-17 DIAGNOSIS — F17200 Nicotine dependence, unspecified, uncomplicated: Secondary | ICD-10-CM | POA: Diagnosis not present

## 2014-05-18 DIAGNOSIS — D72829 Elevated white blood cell count, unspecified: Secondary | ICD-10-CM | POA: Diagnosis not present

## 2014-05-18 DIAGNOSIS — N184 Chronic kidney disease, stage 4 (severe): Secondary | ICD-10-CM | POA: Diagnosis not present

## 2014-05-18 DIAGNOSIS — J690 Pneumonitis due to inhalation of food and vomit: Secondary | ICD-10-CM | POA: Diagnosis not present

## 2014-05-21 DIAGNOSIS — J690 Pneumonitis due to inhalation of food and vomit: Secondary | ICD-10-CM | POA: Diagnosis not present

## 2014-05-21 DIAGNOSIS — D72829 Elevated white blood cell count, unspecified: Secondary | ICD-10-CM | POA: Diagnosis not present

## 2014-05-21 DIAGNOSIS — N184 Chronic kidney disease, stage 4 (severe): Secondary | ICD-10-CM | POA: Diagnosis not present

## 2014-05-23 DIAGNOSIS — J449 Chronic obstructive pulmonary disease, unspecified: Secondary | ICD-10-CM | POA: Diagnosis not present

## 2014-05-23 DIAGNOSIS — J189 Pneumonia, unspecified organism: Secondary | ICD-10-CM | POA: Diagnosis not present

## 2014-05-23 DIAGNOSIS — D72829 Elevated white blood cell count, unspecified: Secondary | ICD-10-CM | POA: Diagnosis not present

## 2014-05-24 DIAGNOSIS — N184 Chronic kidney disease, stage 4 (severe): Secondary | ICD-10-CM | POA: Diagnosis not present

## 2014-05-24 DIAGNOSIS — D72829 Elevated white blood cell count, unspecified: Secondary | ICD-10-CM | POA: Diagnosis not present

## 2014-05-24 DIAGNOSIS — J690 Pneumonitis due to inhalation of food and vomit: Secondary | ICD-10-CM | POA: Diagnosis not present

## 2014-05-29 DIAGNOSIS — D509 Iron deficiency anemia, unspecified: Secondary | ICD-10-CM | POA: Diagnosis not present

## 2014-05-29 DIAGNOSIS — I480 Paroxysmal atrial fibrillation: Secondary | ICD-10-CM | POA: Diagnosis not present

## 2014-05-29 DIAGNOSIS — E785 Hyperlipidemia, unspecified: Secondary | ICD-10-CM | POA: Diagnosis not present

## 2014-05-29 DIAGNOSIS — K922 Gastrointestinal hemorrhage, unspecified: Secondary | ICD-10-CM | POA: Diagnosis not present

## 2014-05-30 ENCOUNTER — Encounter (HOSPITAL_COMMUNITY): Payer: Self-pay | Admitting: Internal Medicine

## 2014-05-30 DIAGNOSIS — R0602 Shortness of breath: Secondary | ICD-10-CM | POA: Diagnosis not present

## 2014-05-30 DIAGNOSIS — J449 Chronic obstructive pulmonary disease, unspecified: Secondary | ICD-10-CM | POA: Diagnosis not present

## 2014-05-30 DIAGNOSIS — I509 Heart failure, unspecified: Secondary | ICD-10-CM | POA: Diagnosis not present

## 2014-05-31 DIAGNOSIS — J439 Emphysema, unspecified: Secondary | ICD-10-CM | POA: Diagnosis not present

## 2014-05-31 DIAGNOSIS — J449 Chronic obstructive pulmonary disease, unspecified: Secondary | ICD-10-CM | POA: Diagnosis not present

## 2014-05-31 DIAGNOSIS — Z9981 Dependence on supplemental oxygen: Secondary | ICD-10-CM | POA: Diagnosis not present

## 2014-05-31 DIAGNOSIS — I120 Hypertensive chronic kidney disease with stage 5 chronic kidney disease or end stage renal disease: Secondary | ICD-10-CM | POA: Diagnosis not present

## 2014-05-31 DIAGNOSIS — N184 Chronic kidney disease, stage 4 (severe): Secondary | ICD-10-CM | POA: Diagnosis not present

## 2014-06-04 DIAGNOSIS — Z9981 Dependence on supplemental oxygen: Secondary | ICD-10-CM | POA: Diagnosis not present

## 2014-06-04 DIAGNOSIS — M1990 Unspecified osteoarthritis, unspecified site: Secondary | ICD-10-CM | POA: Diagnosis not present

## 2014-06-04 DIAGNOSIS — J439 Emphysema, unspecified: Secondary | ICD-10-CM | POA: Diagnosis not present

## 2014-06-04 DIAGNOSIS — I120 Hypertensive chronic kidney disease with stage 5 chronic kidney disease or end stage renal disease: Secondary | ICD-10-CM | POA: Diagnosis not present

## 2014-06-04 DIAGNOSIS — I25119 Atherosclerotic heart disease of native coronary artery with unspecified angina pectoris: Secondary | ICD-10-CM | POA: Diagnosis not present

## 2014-06-04 DIAGNOSIS — I509 Heart failure, unspecified: Secondary | ICD-10-CM | POA: Diagnosis not present

## 2014-06-04 DIAGNOSIS — N184 Chronic kidney disease, stage 4 (severe): Secondary | ICD-10-CM | POA: Diagnosis not present

## 2014-06-04 DIAGNOSIS — J449 Chronic obstructive pulmonary disease, unspecified: Secondary | ICD-10-CM | POA: Diagnosis not present

## 2014-06-05 DIAGNOSIS — R0602 Shortness of breath: Secondary | ICD-10-CM | POA: Diagnosis not present

## 2014-06-05 DIAGNOSIS — J449 Chronic obstructive pulmonary disease, unspecified: Secondary | ICD-10-CM | POA: Diagnosis not present

## 2014-06-05 DIAGNOSIS — I509 Heart failure, unspecified: Secondary | ICD-10-CM | POA: Diagnosis not present

## 2014-06-06 DIAGNOSIS — N184 Chronic kidney disease, stage 4 (severe): Secondary | ICD-10-CM | POA: Diagnosis not present

## 2014-06-06 DIAGNOSIS — J449 Chronic obstructive pulmonary disease, unspecified: Secondary | ICD-10-CM | POA: Diagnosis not present

## 2014-06-06 DIAGNOSIS — Z9981 Dependence on supplemental oxygen: Secondary | ICD-10-CM | POA: Diagnosis not present

## 2014-06-06 DIAGNOSIS — J439 Emphysema, unspecified: Secondary | ICD-10-CM | POA: Diagnosis not present

## 2014-06-06 DIAGNOSIS — I120 Hypertensive chronic kidney disease with stage 5 chronic kidney disease or end stage renal disease: Secondary | ICD-10-CM | POA: Diagnosis not present

## 2014-06-11 DIAGNOSIS — Z9981 Dependence on supplemental oxygen: Secondary | ICD-10-CM | POA: Diagnosis not present

## 2014-06-11 DIAGNOSIS — J439 Emphysema, unspecified: Secondary | ICD-10-CM | POA: Diagnosis not present

## 2014-06-11 DIAGNOSIS — J449 Chronic obstructive pulmonary disease, unspecified: Secondary | ICD-10-CM | POA: Diagnosis not present

## 2014-06-11 DIAGNOSIS — N184 Chronic kidney disease, stage 4 (severe): Secondary | ICD-10-CM | POA: Diagnosis not present

## 2014-06-11 DIAGNOSIS — I120 Hypertensive chronic kidney disease with stage 5 chronic kidney disease or end stage renal disease: Secondary | ICD-10-CM | POA: Diagnosis not present

## 2014-06-13 DIAGNOSIS — I10 Essential (primary) hypertension: Secondary | ICD-10-CM | POA: Diagnosis not present

## 2014-06-13 DIAGNOSIS — I129 Hypertensive chronic kidney disease with stage 1 through stage 4 chronic kidney disease, or unspecified chronic kidney disease: Secondary | ICD-10-CM | POA: Diagnosis not present

## 2014-06-13 DIAGNOSIS — J449 Chronic obstructive pulmonary disease, unspecified: Secondary | ICD-10-CM | POA: Diagnosis not present

## 2014-06-13 DIAGNOSIS — I2510 Atherosclerotic heart disease of native coronary artery without angina pectoris: Secondary | ICD-10-CM | POA: Diagnosis not present

## 2014-06-14 DIAGNOSIS — N184 Chronic kidney disease, stage 4 (severe): Secondary | ICD-10-CM | POA: Diagnosis not present

## 2014-06-14 DIAGNOSIS — J449 Chronic obstructive pulmonary disease, unspecified: Secondary | ICD-10-CM | POA: Diagnosis not present

## 2014-06-14 DIAGNOSIS — Z9981 Dependence on supplemental oxygen: Secondary | ICD-10-CM | POA: Diagnosis not present

## 2014-06-14 DIAGNOSIS — I120 Hypertensive chronic kidney disease with stage 5 chronic kidney disease or end stage renal disease: Secondary | ICD-10-CM | POA: Diagnosis not present

## 2014-06-14 DIAGNOSIS — J439 Emphysema, unspecified: Secondary | ICD-10-CM | POA: Diagnosis not present

## 2014-06-17 DIAGNOSIS — J449 Chronic obstructive pulmonary disease, unspecified: Secondary | ICD-10-CM | POA: Diagnosis not present

## 2014-06-18 DIAGNOSIS — J449 Chronic obstructive pulmonary disease, unspecified: Secondary | ICD-10-CM | POA: Diagnosis not present

## 2014-06-18 DIAGNOSIS — I120 Hypertensive chronic kidney disease with stage 5 chronic kidney disease or end stage renal disease: Secondary | ICD-10-CM | POA: Diagnosis not present

## 2014-06-18 DIAGNOSIS — J439 Emphysema, unspecified: Secondary | ICD-10-CM | POA: Diagnosis not present

## 2014-06-18 DIAGNOSIS — N184 Chronic kidney disease, stage 4 (severe): Secondary | ICD-10-CM | POA: Diagnosis not present

## 2014-06-18 DIAGNOSIS — Z9981 Dependence on supplemental oxygen: Secondary | ICD-10-CM | POA: Diagnosis not present

## 2014-06-27 ENCOUNTER — Emergency Department (HOSPITAL_COMMUNITY)
Admission: EM | Admit: 2014-06-27 | Discharge: 2014-06-27 | Disposition: A | Discharge status: Home / Self Care | Payer: Medicare Other | Attending: Emergency Medicine | Admitting: Emergency Medicine

## 2014-06-27 ENCOUNTER — Encounter (HOSPITAL_COMMUNITY): Payer: Self-pay | Admitting: Emergency Medicine

## 2014-06-27 DIAGNOSIS — S60417A Abrasion of left little finger, initial encounter: Secondary | ICD-10-CM | POA: Diagnosis not present

## 2014-06-27 DIAGNOSIS — Y9389 Activity, other specified: Secondary | ICD-10-CM | POA: Insufficient documentation

## 2014-06-27 DIAGNOSIS — Z79899 Other long term (current) drug therapy: Secondary | ICD-10-CM | POA: Diagnosis not present

## 2014-06-27 DIAGNOSIS — Y998 Other external cause status: Secondary | ICD-10-CM | POA: Insufficient documentation

## 2014-06-27 DIAGNOSIS — K219 Gastro-esophageal reflux disease without esophagitis: Secondary | ICD-10-CM | POA: Insufficient documentation

## 2014-06-27 DIAGNOSIS — Z8659 Personal history of other mental and behavioral disorders: Secondary | ICD-10-CM | POA: Insufficient documentation

## 2014-06-27 DIAGNOSIS — Y9289 Other specified places as the place of occurrence of the external cause: Secondary | ICD-10-CM | POA: Diagnosis not present

## 2014-06-27 DIAGNOSIS — W5311XA Bitten by rat, initial encounter: Secondary | ICD-10-CM | POA: Diagnosis not present

## 2014-06-27 DIAGNOSIS — M1990 Unspecified osteoarthritis, unspecified site: Secondary | ICD-10-CM | POA: Diagnosis not present

## 2014-06-27 DIAGNOSIS — Z7952 Long term (current) use of systemic steroids: Secondary | ICD-10-CM | POA: Diagnosis not present

## 2014-06-27 DIAGNOSIS — Z9981 Dependence on supplemental oxygen: Secondary | ICD-10-CM | POA: Insufficient documentation

## 2014-06-27 DIAGNOSIS — I509 Heart failure, unspecified: Secondary | ICD-10-CM | POA: Insufficient documentation

## 2014-06-27 DIAGNOSIS — Z23 Encounter for immunization: Secondary | ICD-10-CM | POA: Diagnosis not present

## 2014-06-27 DIAGNOSIS — I252 Old myocardial infarction: Secondary | ICD-10-CM | POA: Insufficient documentation

## 2014-06-27 DIAGNOSIS — J449 Chronic obstructive pulmonary disease, unspecified: Secondary | ICD-10-CM | POA: Diagnosis not present

## 2014-06-27 DIAGNOSIS — I2510 Atherosclerotic heart disease of native coronary artery without angina pectoris: Secondary | ICD-10-CM | POA: Diagnosis not present

## 2014-06-27 DIAGNOSIS — Z87891 Personal history of nicotine dependence: Secondary | ICD-10-CM | POA: Diagnosis not present

## 2014-06-27 DIAGNOSIS — I10 Essential (primary) hypertension: Secondary | ICD-10-CM | POA: Diagnosis not present

## 2014-06-27 DIAGNOSIS — S6992XA Unspecified injury of left wrist, hand and finger(s), initial encounter: Secondary | ICD-10-CM | POA: Diagnosis present

## 2014-06-27 MED ORDER — RABIES IMMUNE GLOBULIN 150 UNIT/ML IM INJ
20.0000 [IU]/kg | INJECTION | Freq: Once | INTRAMUSCULAR | Status: AC
Start: 2014-06-27 — End: 2014-06-27
  Administered 2014-06-27: 825 [IU] via INTRAMUSCULAR
  Filled 2014-06-27: qty 2

## 2014-06-27 MED ORDER — TETANUS-DIPHTH-ACELL PERTUSSIS 5-2.5-18.5 LF-MCG/0.5 IM SUSP
0.5000 mL | Freq: Once | INTRAMUSCULAR | Status: AC
Start: 2014-06-27 — End: 2014-06-27
  Administered 2014-06-27: 0.5 mL via INTRAMUSCULAR
  Filled 2014-06-27: qty 0.5

## 2014-06-27 MED ORDER — AMOXICILLIN-POT CLAVULANATE 875-125 MG PO TABS
1.0000 | ORAL_TABLET | Freq: Once | ORAL | Status: AC
  Administered 2014-06-27: 1 via ORAL
  Filled 2014-06-27: qty 1

## 2014-06-27 MED ORDER — PENICILLIN V POTASSIUM 500 MG PO TABS: 500.0000 mg | ORAL_TABLET | Freq: Four times a day (QID) | ORAL | Status: DC

## 2014-06-27 MED ORDER — RABIES VACCINE, PCEC IM SUSR
1.0000 mL | Freq: Once | INTRAMUSCULAR | Status: AC
  Administered 2014-06-27: 1 mL via INTRAMUSCULAR
  Filled 2014-06-27: qty 1

## 2014-06-27 NOTE — ED Provider Notes (Addendum)
This chart was scribed for [place], DO by Stephania Fragmin, ED Scribe. This patient was seen in room APA15/APA15.   TIME SEEN: 1:28 PM   CHIEF COMPLAINT: Rat bite  HPI: Alexis Dillon is a 69 y.o. right-hand-dominant female who presents to the Emergency Department complaining of a rat bite to her left fifth finger that occurred this morning. Patient states she was sleeping and woke up to the rat biting her hand. Her granddaughter reports that they have been trying to catch the rat for some time now, but it has been resistant to various methods of pest control. Patient denies any bites to any other areas of her body. Per granddaughter, patient is unsure when her last tetanus shot was. Her PCP is Dr. Luan Pulling, who sent her here. Denies fever or drainage. No numbness or focal weakness.   ROS: See HPI Constitutional: no fever  Eyes: no drainage  ENT: no runny nose   Cardiovascular:  no chest pain  Resp: no SOB  GI: no vomiting GU: no dysuria Integumentary: no rash  Allergy: no hives  Musculoskeletal: no leg swelling  Neurological: no slurred speech ROS otherwise negative  PAST MEDICAL HISTORY/PAST SURGICAL HISTORY:  Past Medical History  Diagnosis Date  . COPD (chronic obstructive pulmonary disease)   . Tobacco abuse   . Hypertension   . History of atrial septal defect     Closure in 50's   . Mental retardation, idiopathic mild   . Arthritis   . NSTEMI (non-ST elevated myocardial infarction)   . A-fib 04/2013    S/P TEE/DCCV  . GERD (gastroesophageal reflux disease)   . Atrophy of right kidney   . CHF (congestive heart failure)   . Diverticulosis   . Hiatal hernia   . Barrett esophagus   . Ischemic cardiomyopathy     EF of 25%  . On home O2     prn  . CAD (coronary artery disease) 04/2013    oronary dominance: right Left mainstem: LM calcified with long 30% stenosis.     MEDICATIONS:  Prior to Admission medications   Medication Sig Start Date End Date Taking?  Authorizing Provider  acetaminophen (TYLENOL) 325 MG tablet Take 2 tablets (650 mg total) by mouth every 6 (six) hours as needed for mild pain or fever. 05/11/13   Modena Jansky, MD  albuterol (PROVENTIL) (2.5 MG/3ML) 0.083% nebulizer solution Take 3 mLs (2.5 mg total) by nebulization every 2 (two) hours as needed for wheezing or shortness of breath. 04/10/14   Alonza Bogus, MD  ALPRAZolam Duanne Moron) 0.25 MG tablet Take 0.25 mg by mouth 3 (three) times daily as needed for anxiety.  04/26/13   Historical Provider, MD  amiodarone (PACERONE) 200 MG tablet Take 1 tablet (200 mg total) by mouth daily. 04/10/14   Alonza Bogus, MD  atorvastatin (LIPITOR) 80 MG tablet Take 80 mg by mouth daily.    Historical Provider, MD  guaiFENesin (MUCINEX) 600 MG 12 hr tablet Take 2 tablets (1,200 mg total) by mouth 2 (two) times daily. 04/10/14   Alonza Bogus, MD  HYDROcodone-acetaminophen (NORCO/VICODIN) 5-325 MG per tablet Take 1 tablet by mouth every 4 (four) hours as needed for moderate pain.  04/16/13   Historical Provider, MD  ipratropium-albuterol (DUONEB) 0.5-2.5 (3) MG/3ML SOLN Take 3 mLs by nebulization every 6 (six) hours as needed (SHORTNESS OF BREATH).    Historical Provider, MD  pantoprazole (PROTONIX) 40 MG tablet Take 1 tablet (40 mg total) by mouth  2 (two) times daily. 04/10/14   Alonza Bogus, MD  predniSONE (DELTASONE) 10 MG tablet Take 4 tablets (40 mg total) by mouth daily with breakfast. 04/10/14   Alonza Bogus, MD  sodium chloride (OCEAN) 0.65 % SOLN nasal spray Place 1 spray into both nostrils as needed for congestion. 04/10/14   Alonza Bogus, MD    ALLERGIES:  No Known Allergies  SOCIAL HISTORY:  History  Substance Use Topics  . Smoking status: Former Smoker -- 1.00 packs/day for 55 years    Types: Cigarettes  . Smokeless tobacco: Never Used     Comment: stopped Dec 2014  . Alcohol Use: No    FAMILY HISTORY: Family History  Problem Relation Age of Onset  .  Heart attack Brother   . Heart attack Brother   . Diabetes Mother   . Heart attack Father     Died at 72 from MI  . Heart attack Sister     stent at 36  . Heart attack Sister     stent at 44  . Cancer Brother     lung  . Cancer Brother     lung   . Colon cancer Brother     deceased, diagnosed in early 20s    EXAM: BP 118/62 mmHg  Pulse 83  Temp(Src) 97.4 F (36.3 C) (Oral)  Resp 18  Ht 5\' 3"  (1.6 m)  Wt 94 lb 9.6 oz (42.91 kg)  BMI 16.76 kg/m2  SpO2 100% CONSTITUTIONAL: Alert and oriented and responds appropriately to questions. Elderly and frail-appearing; nontoxic; no distress. Well-appearing; well-nourished HEAD: Normocephalic EYES: Conjunctivae clear, PERRL ENT: normal nose; no rhinorrhea; moist mucous membranes; pharynx without lesions noted NECK: Supple, no meningismus, no LAD  CARD: RRR; S1 and S2 appreciated; no murmurs, no clicks, no rubs, no gallops RESP: Normal chest excursion without splinting or tachypnea; breath sounds clear and equal bilaterally; no wheezes, no rhonchi, no rales,  ABD/GI: Normal bowel sounds; non-distended; soft, non-tender, no rebound, no guarding BACK:  The back appears normal and is non-tender to palpation, there is no CVA tenderness EXT: Normal ROM in all joints; non-tender to palpation; no edema; normal capillary refill; no cyanosis    SKIN: Normal color for age and race; warm; 2 small abrasions to the fifth left digit just distal to the MCP NEURO: Moves all extremities equally PSYCH: The patient's mood and manner are appropriate. Grooming and personal hygiene are appropriate.  MEDICAL DECISION MAKING: Patient has 2 small abrasions to the left fifth digit from reported rat bite. Will place her on PCN 500mg   x 14 days (per up to date), update tetanus. We'll also give rabies immunoglobulin as well as rabies vaccine. She will be referred to outpatient clinic to receive the rest of her rabies vaccinations. We'll discharge home on Augmentin.  Have discussed with them return precautions and wound care. Dr. Luan Pulling is her PCP. They verbalize understanding and are comfortable with plan.        [place], DO 06/27/14 [place], DO 06/27/14 Vernonia Ward, DO 06/27/14 1429

## 2014-06-27 NOTE — Discharge Instructions (Signed)
Please follow the schedule provided for your future rabies vaccinations. You have received your first rabies vaccination today. If you develop fever, joint pain, body aches, headache, vomiting please return to your primary care physician's office or the emergency department. You may clean this area with warm soap and water. Please take your antibiotics until they have been completed.  Animal Bite An animal bite can result in a scratch on the skin, deep open cut, puncture of the skin, crush injury, or tearing away of the skin or a body part. Dogs are responsible for most animal bites. Children are bitten more often than adults. An animal bite can range from very mild to more serious. A small bite from your house pet is no cause for alarm. However, some animal bites can become infected or injure a bone or other tissue. You must seek medical care if:  The skin is broken and bleeding does not slow down or stop after 15 minutes.  The puncture is deep and difficult to clean (such as a cat bite).  Pain, warmth, redness, or pus develops around the wound.  The bite is from a stray animal or rodent. There may be a risk of rabies infection.  The bite is from a snake, raccoon, skunk, fox, coyote, or bat. There may be a risk of rabies infection.  The person bitten has a chronic illness such as diabetes, liver disease, or cancer, or the person takes medicine that lowers the immune system.  There is concern about the location and severity of the bite. It is important to clean and protect an animal bite wound right away to prevent infection. Follow these steps:  Clean the wound with plenty of water and soap.  Apply an antibiotic cream.  Apply gentle pressure over the wound with a clean towel or gauze to slow or stop bleeding.  Elevate the affected area above the heart to help stop any bleeding.  Seek medical care. Getting medical care within 8 hours of the animal bite leads to the best possible  outcome. DIAGNOSIS  Your caregiver will most likely:  Take a detailed history of the animal and the bite injury.  Perform a wound exam.  Take your medical history. Blood tests or X-rays may be performed. Sometimes, infected bite wounds are cultured and sent to a lab to identify the infectious bacteria.  TREATMENT  Medical treatment will depend on the location and type of animal bite as well as the patient's medical history. Treatment may include:  Wound care, such as cleaning and flushing the wound with saline solution, bandaging, and elevating the affected area.  Antibiotics.  Tetanus immunization.  Rabies immunization.  Leaving the wound open to heal. This is often done with animal bites, due to the high risk of infection. However, in certain cases, wound closure with stitches, wound adhesive, skin adhesive strips, or staples may be used. Infected bites that are left untreated may require intravenous (IV) antibiotics and surgical treatment in the hospital. [place]  Follow your caregiver's instructions for wound care.  Take all medicines as directed.  If your caregiver prescribes antibiotics, take them as directed. Finish them even if you start to feel better.  Follow up with your caregiver for further exams or immunizations as directed. You may need a tetanus shot if:  You cannot remember when you had your last tetanus shot.  You have never had a tetanus shot.  The injury broke your skin. If you get a tetanus shot, your arm  may swell, get red, and feel warm to the touch. This is common and not a problem. If you need a tetanus shot and you choose not to have one, there is a rare chance of getting tetanus. Sickness from tetanus can be serious. SEEK MEDICAL CARE IF:  You notice warmth, redness, soreness, swelling, pus discharge, or a bad smell coming from the wound.  You have a red line on the skin coming from the wound.  You have a fever, chills, or a  general ill feeling.  You have nausea or vomiting.  You have continued or worsening pain.  You have trouble moving the injured part.  You have other questions or concerns. MAKE SURE YOU:  Understand these instructions.  Will watch your condition.  Will get help right away if you are not doing well or get worse. Document Released: 01/19/2011 Document Revised: 07/26/2011 Document Reviewed: 01/19/2011 [facility] Patient Information 2015 [place], [place]. This information is not intended to replace advice given to you by your health care provider. Make sure you discuss any questions you have with your health care provider.

## 2014-06-27 NOTE — ED Notes (Signed)
Pt states that she was sleeping and woke up with a rat biting her on the left hand.

## 2014-06-30 DIAGNOSIS — J449 Chronic obstructive pulmonary disease, unspecified: Secondary | ICD-10-CM | POA: Diagnosis not present

## 2014-07-02 ENCOUNTER — Encounter (HOSPITAL_COMMUNITY)
Admission: RE | Admit: 2014-07-02 | Discharge: 2014-07-02 | Disposition: A | Discharge status: Home / Self Care | Payer: Medicare Other | Source: Ambulatory Visit | Attending: Internal Medicine | Admitting: Internal Medicine

## 2014-07-02 DIAGNOSIS — Z23 Encounter for immunization: Secondary | ICD-10-CM | POA: Insufficient documentation

## 2014-07-02 MED ORDER — RABIES VACCINE, PCEC IM SUSR
1.0000 mL | Freq: Once | INTRAMUSCULAR | Status: AC
  Administered 2014-07-02: 1 mL via INTRAMUSCULAR

## 2014-07-02 MED ORDER — RABIES VACCINE, PCEC IM SUSR
INTRAMUSCULAR | Status: AC
Start: 2014-07-02 — End: 2014-07-02
  Filled 2014-07-02: qty 1

## 2014-07-05 ENCOUNTER — Encounter (HOSPITAL_COMMUNITY)
Admission: RE | Admit: 2014-07-05 | Discharge: 2014-07-05 | Disposition: A | Discharge status: Home / Self Care | Payer: Medicare Other | Source: Ambulatory Visit | Attending: Internal Medicine | Admitting: Internal Medicine

## 2014-07-05 ENCOUNTER — Encounter (HOSPITAL_COMMUNITY): Payer: Self-pay

## 2014-07-05 DIAGNOSIS — I25119 Atherosclerotic heart disease of native coronary artery with unspecified angina pectoris: Secondary | ICD-10-CM | POA: Diagnosis not present

## 2014-07-05 DIAGNOSIS — I509 Heart failure, unspecified: Secondary | ICD-10-CM | POA: Diagnosis not present

## 2014-07-05 DIAGNOSIS — M1990 Unspecified osteoarthritis, unspecified site: Secondary | ICD-10-CM | POA: Diagnosis not present

## 2014-07-05 DIAGNOSIS — Z23 Encounter for immunization: Secondary | ICD-10-CM | POA: Diagnosis not present

## 2014-07-05 DIAGNOSIS — J449 Chronic obstructive pulmonary disease, unspecified: Secondary | ICD-10-CM | POA: Diagnosis not present

## 2014-07-05 MED ORDER — RABIES VACCINE, PCEC IM SUSR
INTRAMUSCULAR | Status: AC
  Filled 2014-07-05: qty 1

## 2014-07-05 MED ORDER — RABIES VACCINE, PCEC IM SUSR
1.0000 mL | Freq: Once | INTRAMUSCULAR | Status: AC
  Administered 2014-07-05: 1 mL via INTRAMUSCULAR

## 2014-07-09 ENCOUNTER — Encounter (HOSPITAL_COMMUNITY)
Admission: RE | Admit: 2014-07-09 | Discharge: 2014-07-09 | Disposition: A | Discharge status: Home / Self Care | Payer: Medicare Other | Source: Ambulatory Visit | Attending: Internal Medicine | Admitting: Internal Medicine

## 2014-07-09 DIAGNOSIS — Z23 Encounter for immunization: Secondary | ICD-10-CM | POA: Diagnosis not present

## 2014-07-09 MED ORDER — RABIES VACCINE, PCEC IM SUSR
1.0000 mL | Freq: Once | INTRAMUSCULAR | Status: AC
  Administered 2014-07-09: 1 mL via INTRAMUSCULAR

## 2014-07-09 MED ORDER — RABIES VACCINE, PCEC IM SUSR
INTRAMUSCULAR | Status: AC
  Filled 2014-07-09: qty 1

## 2014-07-14 ENCOUNTER — Encounter (HOSPITAL_COMMUNITY): Payer: Self-pay | Admitting: Emergency Medicine

## 2014-07-14 ENCOUNTER — Ambulatory Visit (HOSPITAL_COMMUNITY): Payer: Medicare Other

## 2014-07-14 ENCOUNTER — Inpatient Hospital Stay (HOSPITAL_COMMUNITY)
Admission: EM | Admit: 2014-07-14 | Discharge: 2014-07-18 | DRG: 303 | Disposition: A | Discharge status: Home / Self Care | Payer: Medicare Other | Source: Other Acute Inpatient Hospital | Attending: Internal Medicine | Admitting: Internal Medicine

## 2014-07-14 DIAGNOSIS — Q2733 Arteriovenous malformation of digestive system vessel: Secondary | ICD-10-CM

## 2014-07-14 DIAGNOSIS — I482 Chronic atrial fibrillation: Secondary | ICD-10-CM | POA: Diagnosis present

## 2014-07-14 DIAGNOSIS — Z90710 Acquired absence of both cervix and uterus: Secondary | ICD-10-CM | POA: Diagnosis not present

## 2014-07-14 DIAGNOSIS — Z7982 Long term (current) use of aspirin: Secondary | ICD-10-CM | POA: Diagnosis not present

## 2014-07-14 DIAGNOSIS — I255 Ischemic cardiomyopathy: Secondary | ICD-10-CM | POA: Diagnosis present

## 2014-07-14 DIAGNOSIS — I129 Hypertensive chronic kidney disease with stage 1 through stage 4 chronic kidney disease, or unspecified chronic kidney disease: Secondary | ICD-10-CM | POA: Diagnosis present

## 2014-07-14 DIAGNOSIS — Z87891 Personal history of nicotine dependence: Secondary | ICD-10-CM | POA: Diagnosis not present

## 2014-07-14 DIAGNOSIS — S61459A Open bite of unspecified hand, initial encounter: Secondary | ICD-10-CM

## 2014-07-14 DIAGNOSIS — E785 Hyperlipidemia, unspecified: Secondary | ICD-10-CM | POA: Diagnosis present

## 2014-07-14 DIAGNOSIS — K449 Diaphragmatic hernia without obstruction or gangrene: Secondary | ICD-10-CM | POA: Diagnosis not present

## 2014-07-14 DIAGNOSIS — Z9981 Dependence on supplemental oxygen: Secondary | ICD-10-CM | POA: Diagnosis not present

## 2014-07-14 DIAGNOSIS — J9610 Chronic respiratory failure, unspecified whether with hypoxia or hypercapnia: Secondary | ICD-10-CM

## 2014-07-14 DIAGNOSIS — E875 Hyperkalemia: Secondary | ICD-10-CM | POA: Diagnosis not present

## 2014-07-14 DIAGNOSIS — N183 Chronic kidney disease, stage 3 unspecified: Secondary | ICD-10-CM | POA: Diagnosis present

## 2014-07-14 DIAGNOSIS — K219 Gastro-esophageal reflux disease without esophagitis: Secondary | ICD-10-CM | POA: Diagnosis not present

## 2014-07-14 DIAGNOSIS — R079 Chest pain, unspecified: Secondary | ICD-10-CM | POA: Diagnosis not present

## 2014-07-14 DIAGNOSIS — M1990 Unspecified osteoarthritis, unspecified site: Secondary | ICD-10-CM | POA: Diagnosis not present

## 2014-07-14 DIAGNOSIS — I208 Other forms of angina pectoris: Secondary | ICD-10-CM

## 2014-07-14 DIAGNOSIS — Z96641 Presence of right artificial hip joint: Secondary | ICD-10-CM | POA: Diagnosis present

## 2014-07-14 DIAGNOSIS — Z23 Encounter for immunization: Secondary | ICD-10-CM

## 2014-07-14 DIAGNOSIS — K2270 Barrett's esophagus without dysplasia: Secondary | ICD-10-CM | POA: Diagnosis not present

## 2014-07-14 DIAGNOSIS — I5022 Chronic systolic (congestive) heart failure: Secondary | ICD-10-CM | POA: Insufficient documentation

## 2014-07-14 DIAGNOSIS — I25110 Atherosclerotic heart disease of native coronary artery with unstable angina pectoris: Secondary | ICD-10-CM | POA: Diagnosis not present

## 2014-07-14 DIAGNOSIS — D649 Anemia, unspecified: Secondary | ICD-10-CM | POA: Diagnosis present

## 2014-07-14 DIAGNOSIS — F70 Mild intellectual disabilities: Secondary | ICD-10-CM | POA: Diagnosis not present

## 2014-07-14 DIAGNOSIS — J449 Chronic obstructive pulmonary disease, unspecified: Secondary | ICD-10-CM | POA: Diagnosis not present

## 2014-07-14 DIAGNOSIS — I5042 Chronic combined systolic (congestive) and diastolic (congestive) heart failure: Secondary | ICD-10-CM | POA: Diagnosis present

## 2014-07-14 DIAGNOSIS — I480 Paroxysmal atrial fibrillation: Secondary | ICD-10-CM | POA: Diagnosis not present

## 2014-07-14 DIAGNOSIS — Z8679 Personal history of other diseases of the circulatory system: Secondary | ICD-10-CM

## 2014-07-14 DIAGNOSIS — I200 Unstable angina: Secondary | ICD-10-CM | POA: Diagnosis present

## 2014-07-14 DIAGNOSIS — R0602 Shortness of breath: Secondary | ICD-10-CM | POA: Diagnosis not present

## 2014-07-14 DIAGNOSIS — W5311XA Bitten by rat, initial encounter: Secondary | ICD-10-CM | POA: Diagnosis present

## 2014-07-14 DIAGNOSIS — R918 Other nonspecific abnormal finding of lung field: Secondary | ICD-10-CM | POA: Diagnosis not present

## 2014-07-14 HISTORY — DX: Paroxysmal atrial fibrillation: I48.0

## 2014-07-14 HISTORY — DX: Chronic systolic (congestive) heart failure: I50.22

## 2014-07-14 HISTORY — DX: Hyperkalemia: E87.5

## 2014-07-14 HISTORY — DX: Anemia, unspecified: D64.9

## 2014-07-14 HISTORY — DX: Bitten by rat, initial encounter: W53.11XA

## 2014-07-14 HISTORY — DX: Gastrointestinal hemorrhage, unspecified: K92.2

## 2014-07-14 HISTORY — DX: Chronic respiratory failure, unspecified whether with hypoxia or hypercapnia: J96.10

## 2014-07-14 HISTORY — DX: Chronic kidney disease, stage 3 (moderate): N18.3

## 2014-07-14 HISTORY — DX: Chronic kidney disease, stage 3 unspecified: N18.30

## 2014-07-14 LAB — CBC
HCT: 37.5 % (ref 36.0–46.0)
Hemoglobin: 11.3 g/dL — ABNORMAL LOW (ref 12.0–15.0)
MCH: 26.3 pg (ref 26.0–34.0)
MCHC: 30.1 g/dL (ref 30.0–36.0)
MCV: 87.2 fL (ref 78.0–100.0)
Platelets: 344 10*3/uL (ref 150–400)
RBC: 4.3 MIL/uL (ref 3.87–5.11)
RDW: 16.9 % — ABNORMAL HIGH (ref 11.5–15.5)
WBC: 13.4 10*3/uL — AB (ref 4.0–10.5)

## 2014-07-14 LAB — DIFFERENTIAL
BASOS ABS: 0.1 10*3/uL (ref 0.0–0.1)
Basophils Relative: 1 % (ref 0–1)
Eosinophils Absolute: 0.4 10*3/uL (ref 0.0–0.7)
Eosinophils Relative: 3 % (ref 0–5)
Lymphocytes Relative: 25 % (ref 12–46)
Lymphs Abs: 3.4 10*3/uL (ref 0.7–4.0)
Monocytes Absolute: 1.3 10*3/uL — ABNORMAL HIGH (ref 0.1–1.0)
Monocytes Relative: 10 % (ref 3–12)
Neutro Abs: 8.2 10*3/uL — ABNORMAL HIGH (ref 1.7–7.7)
Neutrophils Relative %: 61 % (ref 43–77)

## 2014-07-14 LAB — I-STAT CHEM 8, ED
BUN: 21 mg/dL (ref 6–23)
CALCIUM ION: 1.17 mmol/L (ref 1.13–1.30)
CHLORIDE: 102 mmol/L (ref 96–112)
CREATININE: 1.7 mg/dL — AB (ref 0.50–1.10)
Glucose, Bld: 91 mg/dL (ref 70–99)
HCT: 39 % (ref 36.0–46.0)
Hemoglobin: 13.3 g/dL (ref 12.0–15.0)
Potassium: 3.9 mmol/L (ref 3.5–5.1)
SODIUM: 140 mmol/L (ref 135–145)
TCO2: 22 mmol/L (ref 0–100)

## 2014-07-14 LAB — BASIC METABOLIC PANEL
ANION GAP: 7 (ref 5–15)
BUN: 19 mg/dL (ref 6–23)
CO2: 27 mmol/L (ref 19–32)
Calcium: 9.1 mg/dL (ref 8.4–10.5)
Chloride: 103 mmol/L (ref 96–112)
Creatinine, Ser: 1.65 mg/dL — ABNORMAL HIGH (ref 0.50–1.10)
GFR calc non Af Amer: 31 mL/min — ABNORMAL LOW (ref >90)
GFR, EST AFRICAN AMERICAN: 36 mL/min — AB (ref >90)
Glucose, Bld: 90 mg/dL (ref 70–99)
Potassium: 4 mmol/L (ref 3.5–5.1)
SODIUM: 137 mmol/L (ref 135–145)

## 2014-07-14 LAB — PROTIME-INR
INR: 1.01 (ref 0.00–1.49)
PROTHROMBIN TIME: 13.4 s (ref 11.6–15.2)

## 2014-07-14 LAB — I-STAT CG4 LACTIC ACID, ED: LACTIC ACID, VENOUS: 1.35 mmol/L (ref 0.5–2.0)

## 2014-07-14 LAB — I-STAT TROPONIN, ED: TROPONIN I, POC: 0.02 ng/mL (ref 0.00–0.08)

## 2014-07-14 LAB — APTT: aPTT: 33 seconds (ref 24–37)

## 2014-07-14 MED ORDER — HEPARIN SODIUM (PORCINE) 5000 UNIT/ML IJ SOLN: 60.0000 [IU]/kg | Freq: Once | INTRAMUSCULAR | Status: DC

## 2014-07-14 MED ORDER — HEPARIN (PORCINE) IN NACL 100-0.45 UNIT/ML-% IJ SOLN
650.0000 [IU]/h | INTRAMUSCULAR | Status: DC
  Administered 2014-07-14: 500 [IU]/h via INTRAVENOUS
  Filled 2014-07-14: qty 250

## 2014-07-14 MED ORDER — HEPARIN BOLUS VIA INFUSION
2500.0000 [IU] | Freq: Once | INTRAVENOUS | Status: AC
  Administered 2014-07-14: 2500 [IU] via INTRAVENOUS
  Filled 2014-07-14: qty 2500

## 2014-07-14 MED ORDER — NITROGLYCERIN IN D5W 200-5 MCG/ML-% IV SOLN
0.0000 ug/min | Freq: Once | INTRAVENOUS | Status: AC
  Administered 2014-07-14: 5 ug/min via INTRAVENOUS
  Filled 2014-07-14: qty 250

## 2014-07-14 MED ORDER — SODIUM CHLORIDE 0.9 % IV SOLN
INTRAVENOUS | Status: DC
  Administered 2014-07-14: 22:00:00 via INTRAVENOUS

## 2014-07-14 MED ORDER — HEPARIN SODIUM (PORCINE) 5000 UNIT/ML IJ SOLN: 60.0000 [IU]/kg | INTRAMUSCULAR | Status: DC

## 2014-07-14 NOTE — ED Notes (Addendum)
Cardiologist notified that pt is off of Eliquis and has been since at least christmas due to GI bleed.

## 2014-07-14 NOTE — ED Provider Notes (Signed)
I saw and evaluated the patient, reviewed the resident's note and I agree with the findings and plan.   EKG Interpretation   Date/Time:  Sunday July 14 2014 20:43:49 EST Ventricular Rate:  79 PR Interval:    QRS Duration: 121 QT Interval:  439 QTC Calculation: 503 R Axis:   63 Text Interpretation:  Atrial fibrillation Right bundle branch block  Inferior infarct, age indeterminate Lateral leads are also involved  Baseline wander in lead(s) II No significant change was found Confirmed by  Duaine Radin  MD, TREY ([id number]) on 07/14/2014 10:32:12 PM      68  yo female with history of CAD presenting with chest pain and SOB.  EMS activated code STEMI prior to arrival.  However, EKG on arrival showed some inferior and lateral changes which appeared consistent with prior.  I discussed her case shortly after her arrival with Dr. Irish Lack, who also reviewed her EKG and chart.  We agreed that she did not have indication for emergent cath and Code STEMI was cancelled.  We planned for medical management and admission.  On exam, well appearing, nontoxic, not distressed, normal respiratory effort, normal perfusion, heart sounds normal with RRR, lungs with mild diffuse wheezing, not diaphoretic.  Initial labs reassuring.    Clinical Impression: Chest pain  Houston Siren III, MD 07/14/14 [phone]

## 2014-07-14 NOTE — ED Notes (Signed)
Per ems-- pt c/o cp x 1 hour. ST elevation in lead 2,3, avf. 324 asa and 1 nitro pta. cbg 98 bp 129/84. Hx CABG

## 2014-07-14 NOTE — Progress Notes (Signed)
    Discussed case with Dr. Doy Mince.  Reviewed ECGs.  Patient has had inferior and lateral ST changes in the past.  In addition, I reviewed her cath films.  She has a CTO of the RCA.  She has been referred for CABG but due to comorbidities including renal insufficiency and mild mental retardation, she has been medically managed.  Her pain reduced from an 8/10 to 1/10 with 1 SL NTG.  Cr in the past has been in the 2-2.5 range.  She is on chronic anticoagulation with Eliquis.    Due to increased risk of contrast induced nephropathy and bleeding, would not pursue emergent cardiac cath at this time, especially since ECG does not seem to be a clear change and her sx are much improved with SL NTG.  Plan usual workup and aggressive medical therapy at this point in time.  Jettie Booze, MD

## 2014-07-14 NOTE — Progress Notes (Signed)
ANTICOAGULATION CONSULT NOTE - Initial Consult  Pharmacy Consult for Heparin Indication: chest pain/ACS  No Known Allergies  Patient Measurements: Height: 5\' 2"  (157.5 cm) Weight: 92 lb 12.8 oz (42.094 kg) IBW/kg (Calculated) : 50.1 Heparin Dosing Weight: 42 kg  Vital Signs: Temp: 98.6 F (37 C) (02/28 2047) Temp Source: Oral (02/28 2047) BP: 152/78 mmHg (02/28 2048) Pulse Rate: 84 (02/28 2047)  Labs:  Recent Labs  07/14/14 2059  HGB 13.3  HCT 39.0  CREATININE 1.70*    Estimated Creatinine Clearance: 20.8 mL/min (by C-G formula based on Cr of 1.7).   Medical History: Past Medical History  Diagnosis Date  . COPD (chronic obstructive pulmonary disease)   . Tobacco abuse   . Hypertension   . History of atrial septal defect     Closure in 50's   . Mental retardation, idiopathic mild   . Arthritis   . NSTEMI (non-ST elevated myocardial infarction)   . A-fib 04/2013    S/P TEE/DCCV  . GERD (gastroesophageal reflux disease)   . Atrophy of right kidney   . CHF (congestive heart failure)   . Diverticulosis   . Hiatal hernia   . Barrett esophagus   . Ischemic cardiomyopathy     EF of 25%  . On home O2     prn  . CAD (coronary artery disease) 04/2013    oronary dominance: right Left mainstem: LM calcified with long 30% stenosis.     Assessment: 57 YOF who presented on 2/28 with CP and initially CODE STEMI called however cancelled after MD review. Heparin consulted to start anticoagulation for ACS sx while awaiting further cardiology work-up. Hep Wt: 42 kg, baseline CBC wnl.  There is some mention of the patient being on apixaban PTA however the patient claimed to have stopped this - was not any anticoaguation PTA.   Goal of Therapy:  Heparin level 0.3-0.7 units/ml Monitor platelets by anticoagulation protocol: Yes   Plan:  1. Heparin bolus of 2500 units x 1 2. Initiate heparin drip at a rate of 500 units/hr (5 ml/hr) 3. Will continue to monitor for any  signs/symptoms of bleeding and will follow up with heparin level in 8 hours   Alycia Rossetti, PharmD, BCPS Clinical Pharmacist Pager: [phone] 07/14/2014 9:16 PM

## 2014-07-14 NOTE — ED Provider Notes (Signed)
CSN: [id number]     Arrival date & time 07/14/14  2040 History   First MD Initiated Contact with Patient 07/14/14 2052     No chief complaint on file.    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 Pt states she was at home, at rest, when she acutely experienced substernal chest pressure without radiation. She had associated SOB and diaphoresis. She called EMS who gave her full dose ASA and nitro, which improved her pain in severity from 8/10 to 2-3/10. She states the pain feels similar to her pain during her prior MI. Denies h/o similar pains with exertion. She had been well prior to today's episode - no recent fever or chills. No recent sick contacts. Denies any LE edema or orthopnea. She has been taking her meds as prescribed. She states she is not currently on plavix or blood thinner.  Past Medical History  Diagnosis Date  . COPD (chronic obstructive pulmonary disease)   . Tobacco abuse   . Hypertension   . History of atrial septal defect     Closure in 50's   . Mental retardation, idiopathic mild   . Arthritis   . NSTEMI (non-ST elevated myocardial infarction)   . A-fib 04/2013    S/P TEE/DCCV  . GERD (gastroesophageal reflux disease)   . Atrophy of right kidney   . CHF (congestive heart failure)   . Diverticulosis   . Hiatal hernia   . Barrett esophagus   . Ischemic cardiomyopathy     EF of 25%  . On home O2     prn  . CAD (coronary artery disease) 04/2013    oronary dominance: right Left mainstem: LM calcified with long 30% stenosis.    Past Surgical History  Procedure Laterality Date  . Asd repair      in her 57s  . Total hip arthroplasty Right     in her 10s  . Total abdominal hysterectomy      in the  23s  . Tee without cardioversion N/A 05/08/2013    Procedure: TRANSESOPHAGEAL ECHOCARDIOGRAM (TEE);  Surgeon: Lelon Perla, MD;  Location: [facility] ENDOSCOPY;  Service: Cardiovascular;  Laterality: N/A;  . Cardioversion N/A 05/08/2013    Procedure: CARDIOVERSION;  Surgeon: Lelon Perla, MD;  Location: [facility] ENDOSCOPY;  Service: Cardiovascular;  Laterality: N/A;  Dr. Zoila Shutter verified with EP pt in a flutter...will cardiovert...  150 joules @ 4:40, using Propofol 40  mg/IV .Marland Kitchen.successful NSR   . Tonsillectomy    . Colonoscopy N/A 10/24/2013    Dr. Gala Romney: 3 polyps, one removed piecemeal (tubulovillous adenoma). Next TCS 04/2014  . Esophagogastroduodenoscopy N/A 10/24/2013    Dr. Rourk:Barrett's without dysplasia. Next EGD 10/2014  . Cardiac catheterization  04/2013    Left mainstem: LM calcified with long 30% stenosis. Left anterior descending (LAD):  The LAD is large and wraps the apex.  The mid vessel has 50% stenosis followed by a focal 80% stenosis.  D1 small with ostial 75% stenosis.Left anterior descending (LAD):  The LAD is large and wraps the apex.  The mid vessel has 50% stenosis followed by a focal 80% stenosis.  D1 small with ostial 75% stenosis.     . Agile capsule N/A 04/05/2014    Procedure: AGILE CAPSULE;  Surgeon: Marga Melnick  Fields, MD;  Location: [facility];  Service: Endoscopy;  Laterality: N/A;  . Colonoscopy N/A 04/10/2014    Procedure: COLONOSCOPY;  Surgeon: Daneil Dolin, MD;  Location: [facility] ENDO SU[facility];  Service: Endoscopy;  Laterality: N/A;  . Givens capsule study N/A 04/17/2014    Procedure: GIVENS CAPSULE STUDY;  Surgeon: Daneil Dolin, MD;  Location: [facility];  Service: Endoscopy;  Laterality: N/A;  . Left and right heart catheterization with coronary angiogram N/A 05/04/2013    Procedure: LEFT AND RIGHT HEART CATHETERIZATION WITH CORONARY ANGIOGRAM;  Surgeon: Minus Breeding, MD;  Location: [facility] CATH LAB;  Service: Cardiovascular;  Laterality: N/A;   Family History   Problem Relation Age of Onset  . Heart attack Brother   . Heart attack Brother   . Diabetes Mother   . Heart attack Father     Died at 34 from MI  . Heart attack Sister     stent at 52  . Heart attack Sister     stent at 70  . Cancer Brother     lung  . Cancer Brother     lung   . Colon cancer Brother     deceased, diagnosed in early 45s   History  Substance Use Topics  . Smoking status: Former Smoker -- 1.00 packs/day for 55 years    Types: Cigarettes  . Smokeless tobacco: Never Used     Comment: stopped Dec 2014  . Alcohol Use: No   OB History    No data available     Review of Systems  Constitutional: Positive for fatigue. Negative for fever and chills.  HENT: Negative for congestion, rhinorrhea and sore throat.   Eyes: Negative for visual disturbance.  Respiratory: Positive for chest tightness and shortness of breath. Negative for cough and wheezing.   Cardiovascular: Positive for chest pain.  Gastrointestinal: Negative for nausea, vomiting and abdominal pain.  Genitourinary: Negative for dysuria.  Musculoskeletal: Negative for neck pain.  Skin: Negative for rash.  Allergic/Immunologic: Negative for immunocompromised state.  Neurological: Negative for dizziness, syncope and headaches.      Allergies  Review of patient's allergies indicates no known allergies.  Home Medications   Prior to Admission medications   Medication Sig Start Date End Date Taking? Authorizing Provider  acetaminophen (TYLENOL) 325 MG tablet Take 2 tablets (650 mg total) by mouth every 6 (six) hours as needed for mild pain or fever. 05/11/13   Modena Jansky, MD  albuterol (PROVENTIL) (2.5 MG/3ML) 0.083% nebulizer solution Take 3 mLs (2.5 mg total) by nebulization every 2 (two) hours as needed for wheezing or shortness of breath. 04/10/14   Alonza Bogus, MD  ALPRAZolam Duanne Moron) 0.25 MG tablet Take 0.25 mg by mouth 3 (three) times daily as needed for anxiety.  04/26/13   Historical  Provider, MD  amiodarone (PACERONE) 200 MG tablet Take 1 tablet (200 mg total) by mouth daily. 04/10/14   Alonza Bogus, MD  atorvastatin (LIPITOR) 80 MG tablet Take 80 mg by mouth daily.    Historical Provider, MD  cholecalciferol (VITAMIN D) 1000 UNITS tablet Take 2,000 Units by mouth daily.    Historical Provider, MD  guaiFENesin (MUCINEX) 600 MG 12 hr tablet Take 2 tablets (1,200 mg total) by mouth 2 (two) times daily. Patient not taking: Reported on 06/27/2014 04/10/14   Alonza Bogus, MD  HYDROcodone-acetaminophen (NORCO/VICODIN) 5-325 MG per tablet Take 1 tablet by mouth every 4 (four) hours as needed for moderate pain.  04/16/13   Historical Provider, MD  ipratropium-albuterol (DUONEB) 0.5-2.5 (3) MG/3ML SOLN Take 3 mLs by nebulization every 6 (six) hours as needed (SHORTNESS OF BREATH).    Historical Provider, MD  pantoprazole (PROTONIX) 40 MG tablet Take 1 tablet (40 mg total) by mouth 2 (two) times daily. 04/10/14   Alonza Bogus, MD  penicillin v potassium (VEETID) 500 MG tablet Take 1 tablet (500 mg total) by mouth 4 (four) times daily. 06/27/14   Kristen N Ward, DO  predniSONE (DELTASONE) 10 MG tablet Take 4 tablets (40 mg total) by mouth daily with breakfast. Patient not taking: Reported on 06/27/2014 04/10/14   Alonza Bogus, MD  sodium chloride (OCEAN) 0.65 % SOLN nasal spray Place 1 spray into both nostrils as needed for congestion. 04/10/14   Alonza Bogus, MD   BP 152/78 mmHg  Pulse 84  Temp(Src) 98.6 F (37 C) (Oral)  Resp 21  SpO2 94% Physical Exam  Constitutional: She is oriented to person, place, and time. She appears well-developed and well-nourished. No distress.  HENT:  Head: Normocephalic and atraumatic.  Mouth/Throat: No oropharyngeal exudate.  Eyes: Conjunctivae are normal. Pupils are equal, round, and reactive to light.  Neck: Normal range of motion. Neck supple.  Cardiovascular: Normal rate, normal heart sounds and intact distal pulses.  Exam  reveals no friction rub.   No murmur heard. Pulmonary/Chest: Effort normal. No respiratory distress. She has no wheezes. She has no rales.  Breath sounds diminished bilaterally  Abdominal: Soft. Bowel sounds are normal. She exhibits no distension. There is no tenderness.  Musculoskeletal: She exhibits no edema.  Neurological: She is alert and oriented to person, place, and time.  Skin: Skin is warm. No rash noted.  Nursing note and vitals reviewed.   ED Course  Procedures (including critical care time) Labs Review Labs Reviewed  CBC - Abnormal; Notable for the following:    WBC 13.4 (*)    Hemoglobin 11.3 (*)    RDW 16.9 (*)    All other components within normal limits  DIFFERENTIAL - Abnormal; Notable for the following:    Neutro Abs 8.2 (*)    Monocytes Absolute 1.3 (*)    All other components within normal limits  BASIC METABOLIC PANEL - Abnormal; Notable for the following:    Creatinine, Ser 1.65 (*)    GFR calc non Af Amer 31 (*)    GFR calc Af Amer 36 (*)    All other components within normal limits  I-STAT CHEM 8, ED - Abnormal; Notable for the following:    Creatinine, Ser 1.70 (*)    All other components within normal limits  PROTIME-INR  APTT  BRAIN NATRIURETIC PEPTIDE  HEPARIN LEVEL (UNFRACTIONATED)  I-STAT TROPOININ, ED  I-STAT CG4 LACTIC ACID, ED  Randolm Idol, ED    Imaging Review Dg Chest Portable 1 View  07/14/2014   CLINICAL DATA:  Acute onset of shortness of breath and chest pain. Recent myocardial infarction. Initial encounter.  EXAM: PORTABLE CHEST - 1 VIEW  COMPARISON:  Chest radiograph performed 05/16/2014  FINDINGS: The lungs are well-aerated. Bibasilar opacities likely reflect atelectasis. There is no evidence of pleural effusion or pneumothorax.  The cardiomediastinal silhouette is mildly enlarged. The patient is status post median sternotomy. No acute osseous abnormalities are seen. A skin fold is noted overlying the left hemithorax.   IMPRESSION: Bibasilar opacities likely reflect atelectasis; lungs otherwise clear. Mild cardiomegaly noted.   Electronically Signed   By: Francoise Schaumann.D.  On: 07/14/2014 21:12     EKG Interpretation   Date/Time:  Sunday July 14 2014 20:43:49 EST Ventricular Rate:  79 PR Interval:    QRS Duration: 121 QT Interval:  439 QTC Calculation: 503 R Axis:   63 Text Interpretation:  Atrial fibrillation Right bundle branch block  Inferior infarct, age indeterminate Lateral leads are also involved  Baseline wander in lead(s) II No significant change was found Confirmed by  [facility]  MD, TREY ([id number]) on 07/14/2014 10:32:12 PM      MDM   Final diagnoses:  Chest pain, unspecified chest pain type  History of coronary artery disease  Angina at rest    69 yo F with PMHx of HTN, HLD, COPD, multivessel CAD with chronic occlusion of the OM and RCA,, ischemic CM with EF 25% who presents with acute onset substernal chest pressure and SOB with reported diaphoresis. See HPI above. On arrival, T 98.47F, HR 84, RR 21, BP 159/91, satting 94% on home 2L [place]. Exam as above, pt overall in NAD, pulses symmetric. Lungs diminished but symmetric.  Pt initially presented as CODE STEMI due to concern for ST elevation in inferior leads per EMS. ASA, nitro given with improvement in sx. On arrival, EKG strips from EMS reviewed and EKG obtained shows no acute abnormality, with no acute ST elevations. Immediately discussed with Cardiology, who recommend d/c code STEMI with medical management via ASA, heparin, nitro gtt with troponins and admission. Pt o/w HDS. Pulses symmetric, no back pain, no s/s dissection. Will f/u CXR to eval for alternative pulm etiologies. Lungs diminished but with no signs of COPD exacerbation or fluid overload.  Labs as above. CBC with mild leukocytosis of 13.4, o/w unremarkable/at baseline. BMP with baseline CKD. Troponin negative x 1. CXR with mild bibasilar atelectasis. CP ongoing despite  nitro. D/w Cardiology. Will admit to Cards for further work-up of possible ACS. Pt in agreement.  Clinical Impression: 1. Chest pain, unspecified chest pain type   2. History of coronary artery disease   3. Angina at rest     Disposition: Admit  Condition: Stable  Pt seen in conjunction with Dr. Cleone Slim, MD 07/15/14 [place] III, MD 07/15/14 [id number]

## 2014-07-15 DIAGNOSIS — I129 Hypertensive chronic kidney disease with stage 1 through stage 4 chronic kidney disease, or unspecified chronic kidney disease: Secondary | ICD-10-CM | POA: Diagnosis not present

## 2014-07-15 DIAGNOSIS — I25110 Atherosclerotic heart disease of native coronary artery with unstable angina pectoris: Secondary | ICD-10-CM | POA: Diagnosis not present

## 2014-07-15 DIAGNOSIS — Z7982 Long term (current) use of aspirin: Secondary | ICD-10-CM | POA: Diagnosis not present

## 2014-07-15 DIAGNOSIS — I480 Paroxysmal atrial fibrillation: Secondary | ICD-10-CM | POA: Diagnosis not present

## 2014-07-15 DIAGNOSIS — R079 Chest pain, unspecified: Secondary | ICD-10-CM | POA: Diagnosis not present

## 2014-07-15 DIAGNOSIS — N183 Chronic kidney disease, stage 3 (moderate): Secondary | ICD-10-CM

## 2014-07-15 DIAGNOSIS — Q2733 Arteriovenous malformation of digestive system vessel: Secondary | ICD-10-CM | POA: Diagnosis not present

## 2014-07-15 DIAGNOSIS — I200 Unstable angina: Secondary | ICD-10-CM | POA: Diagnosis not present

## 2014-07-15 DIAGNOSIS — E875 Hyperkalemia: Secondary | ICD-10-CM | POA: Diagnosis not present

## 2014-07-15 DIAGNOSIS — I482 Chronic atrial fibrillation: Secondary | ICD-10-CM | POA: Diagnosis not present

## 2014-07-15 DIAGNOSIS — K219 Gastro-esophageal reflux disease without esophagitis: Secondary | ICD-10-CM | POA: Diagnosis not present

## 2014-07-15 DIAGNOSIS — J449 Chronic obstructive pulmonary disease, unspecified: Secondary | ICD-10-CM | POA: Diagnosis not present

## 2014-07-15 DIAGNOSIS — D649 Anemia, unspecified: Secondary | ICD-10-CM | POA: Diagnosis not present

## 2014-07-15 DIAGNOSIS — K449 Diaphragmatic hernia without obstruction or gangrene: Secondary | ICD-10-CM | POA: Diagnosis not present

## 2014-07-15 DIAGNOSIS — M1990 Unspecified osteoarthritis, unspecified site: Secondary | ICD-10-CM | POA: Diagnosis not present

## 2014-07-15 DIAGNOSIS — K2270 Barrett's esophagus without dysplasia: Secondary | ICD-10-CM | POA: Diagnosis not present

## 2014-07-15 DIAGNOSIS — Z23 Encounter for immunization: Secondary | ICD-10-CM | POA: Diagnosis not present

## 2014-07-15 DIAGNOSIS — Z96641 Presence of right artificial hip joint: Secondary | ICD-10-CM | POA: Diagnosis not present

## 2014-07-15 DIAGNOSIS — Z87891 Personal history of nicotine dependence: Secondary | ICD-10-CM | POA: Diagnosis not present

## 2014-07-15 DIAGNOSIS — I5042 Chronic combined systolic (congestive) and diastolic (congestive) heart failure: Secondary | ICD-10-CM | POA: Diagnosis not present

## 2014-07-15 DIAGNOSIS — Z8719 Personal history of other diseases of the digestive system: Secondary | ICD-10-CM

## 2014-07-15 DIAGNOSIS — F70 Mild intellectual disabilities: Secondary | ICD-10-CM | POA: Diagnosis not present

## 2014-07-15 DIAGNOSIS — Z90710 Acquired absence of both cervix and uterus: Secondary | ICD-10-CM | POA: Diagnosis not present

## 2014-07-15 DIAGNOSIS — W5311XA Bitten by rat, initial encounter: Secondary | ICD-10-CM | POA: Diagnosis not present

## 2014-07-15 DIAGNOSIS — E785 Hyperlipidemia, unspecified: Secondary | ICD-10-CM | POA: Diagnosis not present

## 2014-07-15 DIAGNOSIS — I2510 Atherosclerotic heart disease of native coronary artery without angina pectoris: Secondary | ICD-10-CM | POA: Diagnosis not present

## 2014-07-15 DIAGNOSIS — I255 Ischemic cardiomyopathy: Secondary | ICD-10-CM | POA: Diagnosis not present

## 2014-07-15 DIAGNOSIS — Z9981 Dependence on supplemental oxygen: Secondary | ICD-10-CM | POA: Diagnosis not present

## 2014-07-15 LAB — TROPONIN I
TROPONIN I: 0.03 ng/mL (ref <0.031)
Troponin I: <0.03 ng/mL (ref <0.031)
Troponin I: <0.03 ng/mL (ref <0.031)

## 2014-07-15 LAB — MRSA PCR SCREENING: MRSA by PCR: NEGATIVE

## 2014-07-15 LAB — BASIC METABOLIC PANEL
Anion gap: 5 (ref 5–15)
BUN: 18 mg/dL (ref 6–23)
CALCIUM: 8.7 mg/dL (ref 8.4–10.5)
CO2: 30 mmol/L (ref 19–32)
CREATININE: 1.84 mg/dL — AB (ref 0.50–1.10)
Chloride: 104 mmol/L (ref 96–112)
GFR calc Af Amer: 31 mL/min — ABNORMAL LOW (ref >90)
GFR, EST NON AFRICAN AMERICAN: 27 mL/min — AB (ref >90)
Glucose, Bld: 93 mg/dL (ref 70–99)
Potassium: 3.2 mmol/L — ABNORMAL LOW (ref 3.5–5.1)
Sodium: 139 mmol/L (ref 135–145)

## 2014-07-15 LAB — I-STAT TROPONIN, ED: TROPONIN I, POC: 0.03 ng/mL (ref 0.00–0.08)

## 2014-07-15 LAB — BRAIN NATRIURETIC PEPTIDE: B Natriuretic Peptide: 510.9 pg/mL — ABNORMAL HIGH (ref 0.0–100.0)

## 2014-07-15 LAB — LIPID PANEL
CHOLESTEROL: 107 mg/dL (ref 0–200)
HDL: 44 mg/dL (ref >39)
LDL Cholesterol: 41 mg/dL (ref 0–99)
TRIGLYCERIDES: 112 mg/dL (ref <150)
Total CHOL/HDL Ratio: 2.4 RATIO
VLDL: 22 mg/dL (ref 0–40)

## 2014-07-15 LAB — HEPARIN LEVEL (UNFRACTIONATED)
Heparin Unfractionated: 0.15 IU/mL — ABNORMAL LOW (ref 0.30–0.70)
Heparin Unfractionated: <0.1 IU/mL — ABNORMAL LOW (ref 0.30–0.70)

## 2014-07-15 MED ORDER — NITROGLYCERIN 0.4 MG SL SUBL: 0.4000 mg | SUBLINGUAL_TABLET | SUBLINGUAL | Status: DC | PRN

## 2014-07-15 MED ORDER — HEPARIN BOLUS VIA INFUSION
1000.0000 [IU] | Freq: Once | INTRAVENOUS | Status: AC
  Administered 2014-07-15: 1000 [IU] via INTRAVENOUS
  Filled 2014-07-15: qty 1000

## 2014-07-15 MED ORDER — AMIODARONE HCL 200 MG PO TABS
200.0000 mg | ORAL_TABLET | Freq: Every day | ORAL | Status: DC
  Administered 2014-07-15 – 2014-07-18 (×4): 200 mg via ORAL
  Filled 2014-07-15 (×4): qty 1

## 2014-07-15 MED ORDER — ISOSORBIDE MONONITRATE ER 30 MG PO TB24
30.0000 mg | ORAL_TABLET | Freq: Every day | ORAL | Status: DC
  Administered 2014-07-15 – 2014-07-18 (×4): 30 mg via ORAL
  Filled 2014-07-15 (×4): qty 1

## 2014-07-15 MED ORDER — PENICILLIN V POTASSIUM 500 MG PO TABS
500.0000 mg | ORAL_TABLET | Freq: Four times a day (QID) | ORAL | Status: DC
  Administered 2014-07-15 – 2014-07-17 (×9): 500 mg via ORAL
  Filled 2014-07-15 (×12): qty 1

## 2014-07-15 MED ORDER — POTASSIUM CHLORIDE CRYS ER 20 MEQ PO TBCR
40.0000 meq | EXTENDED_RELEASE_TABLET | Freq: Once | ORAL | Status: AC
  Administered 2014-07-15: 40 meq via ORAL
  Filled 2014-07-15: qty 2

## 2014-07-15 MED ORDER — PANTOPRAZOLE SODIUM 40 MG PO TBEC
40.0000 mg | DELAYED_RELEASE_TABLET | Freq: Every day | ORAL | Status: DC
  Administered 2014-07-15 – 2014-07-18 (×4): 40 mg via ORAL
  Filled 2014-07-15 (×4): qty 1

## 2014-07-15 MED ORDER — CARVEDILOL 6.25 MG PO TABS
6.2500 mg | ORAL_TABLET | Freq: Two times a day (BID) | ORAL | Status: DC
  Administered 2014-07-15 – 2014-07-18 (×7): 6.25 mg via ORAL
  Filled 2014-07-15 (×10): qty 1

## 2014-07-15 MED ORDER — ACETAMINOPHEN 325 MG PO TABS
650.0000 mg | ORAL_TABLET | ORAL | Status: DC | PRN
  Administered 2014-07-15 – 2014-07-18 (×9): 650 mg via ORAL
  Filled 2014-07-15 (×9): qty 2

## 2014-07-15 MED ORDER — ALBUTEROL SULFATE (2.5 MG/3ML) 0.083% IN NEBU
3.0000 mL | INHALATION_SOLUTION | Freq: Four times a day (QID) | RESPIRATORY_TRACT | Status: DC | PRN
  Administered 2014-07-17: 3 mL via RESPIRATORY_TRACT
  Filled 2014-07-15: qty 3

## 2014-07-15 MED ORDER — ASPIRIN EC 81 MG PO TBEC
81.0000 mg | DELAYED_RELEASE_TABLET | Freq: Every day | ORAL | Status: DC
  Administered 2014-07-16 – 2014-07-18 (×3): 81 mg via ORAL
  Filled 2014-07-15 (×3): qty 1

## 2014-07-15 MED ORDER — AMLODIPINE BESYLATE 2.5 MG PO TABS
2.5000 mg | ORAL_TABLET | Freq: Every day | ORAL | Status: DC
  Administered 2014-07-15 – 2014-07-18 (×4): 2.5 mg via ORAL
  Filled 2014-07-15 (×4): qty 1

## 2014-07-15 MED ORDER — ATORVASTATIN CALCIUM 80 MG PO TABS
80.0000 mg | ORAL_TABLET | Freq: Every day | ORAL | Status: DC
  Administered 2014-07-15 – 2014-07-18 (×4): 80 mg via ORAL
  Filled 2014-07-15 (×4): qty 1

## 2014-07-15 MED ORDER — FUROSEMIDE 20 MG PO TABS
20.0000 mg | ORAL_TABLET | Freq: Every day | ORAL | Status: DC
  Administered 2014-07-15 – 2014-07-18 (×4): 20 mg via ORAL
  Filled 2014-07-15 (×4): qty 1

## 2014-07-15 MED ORDER — CETYLPYRIDINIUM CHLORIDE 0.05 % MT LIQD
7.0000 mL | Freq: Two times a day (BID) | OROMUCOSAL | Status: DC
  Administered 2014-07-15 – 2014-07-18 (×7): 7 mL via OROMUCOSAL

## 2014-07-15 MED ORDER — ONDANSETRON HCL 4 MG/2ML IJ SOLN
4.0000 mg | Freq: Four times a day (QID) | INTRAMUSCULAR | Status: DC | PRN
  Filled 2014-07-15: qty 2

## 2014-07-15 MED ORDER — SODIUM CHLORIDE 0.9 % IV BOLUS (SEPSIS)
250.0000 mL | Freq: Once | INTRAVENOUS | Status: AC
  Administered 2014-07-15: 250 mL via INTRAVENOUS

## 2014-07-15 NOTE — H&P (Signed)
History and Physical   Admit date: 07/14/2014 Name:  Alexis Dillon Medical record number: [id number] DOB/Age:  1945/08/19  69 y.o. female  Referring Physician:  [facility]  Chief complaint/reason for admission: Chest pain   HPI:  This 69 year old female has a history of an atrial septal defect repair and mild mental retardation.  She has chronic atrial fibrillation and also has ischemic cardiomyopathy and chronic systolic heart failure.  Catheterization done December 2014 showed ischemic cardiomyopathy, occluded right coronary artery and circumflex, mid vessel 50% LAD stenosis and 80% LAD stenosis, diagonal 1 with ostial 75% stenosis.  She was felt to be high risk for bypass grafting after discussion with family this was felt not to be a good option.  Medical management was initially suggested.  She had a GI bleed in December of this year with a hemoglobin of around 6.  She previously had been on Eliquus and this was discontinued in December following the GI bleed.  She later had a capsule endoscopy that showed some possible AVMs.  She presented to the emergency room tonight with severe chest pain and initially a code STEMI was called because of some lateral ST depression.  The interventional cardiologist did not feel she should be considered for acute intervention and initial troponin was negative.  EKG did not appear to be appreciably changed from before.  She was heparinized and placed on intravenous nitroglycerin.  She is currently not having any chest pain.  She has limited understanding of her symptoms.  She is not currently having chest pain or edema.  She does not have any shortness of breath.    Past Medical History  Diagnosis Date  . COPD (chronic obstructive pulmonary disease)   . Tobacco abuse   . Hypertension   . History of atrial septal defect     Closure in 50's   . Mental retardation, idiopathic mild   . Arthritis   . NSTEMI (non-ST elevated myocardial infarction)    . Chronic atrial fibrillation 04/2013    S/P TEE/DCCV  . GERD (gastroesophageal reflux disease)   . Atrophy of right kidney   . Chronic systolic CHF (congestive heart failure)   . Diverticulosis   . Hiatal hernia   . Barrett esophagus   . Ischemic cardiomyopathy     EF of 25%  . CAD (coronary artery disease) 04/2013    oronary dominance: right Left mainstem: LM calcified with long 30% stenosis.       Past Surgical History  Procedure Laterality Date  . Asd repair      in her 89s  . Total hip arthroplasty Right     in her 61s  . Total abdominal hysterectomy      in the 41s  . Tee without cardioversion N/A 05/08/2013    Procedure: TRANSESOPHAGEAL ECHOCARDIOGRAM (TEE);  Surgeon: Lelon Perla, MD;  Location: [facility] ENDOSCOPY;  Service: Cardiovascular;  Laterality: N/A;  . Cardioversion N/A 05/08/2013    Procedure: CARDIOVERSION;  Surgeon: Lelon Perla, MD;  Location: [facility] ENDOSCOPY;  Service: Cardiovascular;  Laterality: N/A;  Dr. Zoila Shutter verified with EP pt in a flutter...will cardiovert...  150 joules @ 4:40, using Propofol 40  mg/IV .Marland Kitchen.successful NSR   . Tonsillectomy    . Colonoscopy N/A 10/24/2013    Dr. Gala Romney: 3 polyps, one removed piecemeal (tubulovillous adenoma). Next TCS 04/2014  . Esophagogastroduodenoscopy N/A 10/24/2013    Dr. Rourk:Barrett's without dysplasia. Next EGD 10/2014  . Cardiac catheterization  04/2013  Left mainstem: LM calcified with long 30% stenosis. Left anterior descending (LAD):  The LAD is large and wraps the apex.  The mid vessel has 50% stenosis followed by a focal 80% stenosis.  D1 small with ostial 75% stenosis.Left anterior descending (LAD):  The LAD is large and wraps the apex.  The mid vessel has 50% stenosis followed by a focal 80% stenosis.  D1 small with ostial 75% stenosis.     . Agile capsule N/A 04/05/2014    Procedure: AGILE CAPSULE;  Surgeon: Danie Binder, MD;  Location: [facility];  Service: Endoscopy;  Laterality: N/A;  .  Colonoscopy N/A 04/10/2014    Procedure: COLONOSCOPY;  Surgeon: Daneil Dolin, MD;  Location: [facility];  Service: Endoscopy;  Laterality: N/A;  . Givens capsule study N/A 04/17/2014    Procedure: GIVENS CAPSULE STUDY;  Surgeon: Daneil Dolin, MD;  Location: [facility];  Service: Endoscopy;  Laterality: N/A;  . Left and right heart catheterization with coronary angiogram N/A 05/04/2013    Procedure: LEFT AND RIGHT HEART CATHETERIZATION WITH CORONARY ANGIOGRAM;  Surgeon: Minus Breeding, MD;  Location: [facility] CATH LAB;  Service: Cardiovascular;  Laterality: N/A;   Allergies: has No Known Allergies.   Medications: Prior to Admission medications   Medication Sig Start Date End Date Taking? Authorizing Provider  albuterol (PROVENTIL HFA;VENTOLIN HFA) 108 (90 BASE) MCG/ACT inhaler Inhale 2 puffs into the lungs every 6 (six) hours as needed for wheezing or shortness of breath.   Yes Historical Provider, MD  aspirin EC 81 MG tablet Take 324 mg by mouth once.   Yes Historical Provider, MD  atorvastatin (LIPITOR) 80 MG tablet Take 80 mg by mouth daily.   Yes Historical Provider, MD  furosemide (LASIX) 20 MG tablet Take 20 mg by mouth daily.   Yes Historical Provider, MD  ibuprofen (ADVIL,MOTRIN) 200 MG tablet Take 200 mg by mouth every 6 (six) hours as needed (pain).   Yes Historical Provider, MD  pantoprazole (PROTONIX) 40 MG tablet Take 40 mg by mouth daily.   Yes Historical Provider, MD  penicillin v potassium (VEETID) 500 MG tablet Take 500 mg by mouth 4 (four) times daily.   Yes Historical Provider, MD  acetaminophen (TYLENOL) 325 MG tablet Take 2 tablets (650 mg total) by mouth every 6 (six) hours as needed for mild pain or fever. 05/11/13   Modena Jansky, MD  albuterol (PROVENTIL) (2.5 MG/3ML) 0.083% nebulizer solution Take 3 mLs (2.5 mg total) by nebulization every 2 (two) hours as needed for wheezing or shortness of breath. 04/10/14   Alonza Bogus, MD  ALPRAZolam Duanne Moron) 0.25 MG tablet  Take 0.25 mg by mouth 3 (three) times daily as needed for anxiety.  04/26/13   Historical Provider, MD  amiodarone (PACERONE) 200 MG tablet Take 1 tablet (200 mg total) by mouth daily. 04/10/14   Alonza Bogus, MD  atorvastatin (LIPITOR) 80 MG tablet Take 80 mg by mouth daily.    Historical Provider, MD  cholecalciferol (VITAMIN D) 1000 UNITS tablet Take 2,000 Units by mouth daily.    Historical Provider, MD  guaiFENesin (MUCINEX) 600 MG 12 hr tablet Take 2 tablets (1,200 mg total) by mouth 2 (two) times daily. Patient not taking: Reported on 06/27/2014 04/10/14   Alonza Bogus, MD  HYDROcodone-acetaminophen (NORCO/VICODIN) 5-325 MG per tablet Take 1 tablet by mouth every 4 (four) hours as needed for moderate pain.  04/16/13   Historical Provider, MD  ipratropium-albuterol (DUONEB) 0.5-2.5 (  3) MG/3ML SOLN Take 3 mLs by nebulization every 6 (six) hours as needed (SHORTNESS OF BREATH).    Historical Provider, MD  pantoprazole (PROTONIX) 40 MG tablet Take 1 tablet (40 mg total) by mouth 2 (two) times daily. 04/10/14   Alonza Bogus, MD  penicillin v potassium (VEETID) 500 MG tablet Take 1 tablet (500 mg total) by mouth 4 (four) times daily. 06/27/14   Kristen N Ward, DO  predniSONE (DELTASONE) 10 MG tablet Take 4 tablets (40 mg total) by mouth daily with breakfast. Patient not taking: Reported on 06/27/2014 04/10/14   Alonza Bogus, MD  sodium chloride (OCEAN) 0.65 % SOLN nasal spray Place 1 spray into both nostrils as needed for congestion. 04/10/14   Alonza Bogus, MD   Family History:  No family status information on file.    Social History:   reports that she has quit smoking. Her smoking use included Cigarettes. She has a 55 pack-year smoking history. She has never used smokeless tobacco. She reports that she does not drink alcohol or use illicit drugs.   History   Social History Narrative   She has mild mental retardation. Patient not married and lives with her sister. Divorced  with no children.      Review of Systems:  Other than as noted above, the remainder of the review of systems is normal  Physical Exam: BP 124/85 mmHg  Pulse 53  Temp(Src) 97.9 F (36.6 C) (Tympanic)  Resp 16  Ht 5\' 2"  (1.575 m)  Wt 42.094 kg (92 lb 12.8 oz)  BMI 16.97 kg/m2  SpO2 97%  General appearance: She is a thin elderly female currently in no acute distress pleasant Head: Normocephalic, without obvious abnormality, atraumatic Eyes: conjunctivae/corneas clear. PERRL, EOM's intact. Fundi benign. Neck: no adenopathy, no carotid bruit, no JVD and supple, symmetrical, trachea midline Lungs: clear to auscultation bilaterally Heart: Regular rate and rhythm, no murmur, normal S1-S2 no S3 Abdomen: soft, non-tender; bowel sounds normal; no masses,  no organomegaly Pulses: 2+ and symmetric Skin: Several excoriations and lesions over the lower legs Neurologic: Grossly normal is oriented to the hospital  Labs: CBC  Recent Labs  07/14/14 2044 07/14/14 2059  WBC 13.4*  --   RBC 4.30  --   HGB 11.3* 13.3  HCT 37.5 39.0  PLT 344  --   MCV 87.2  --   MCH 26.3  --   MCHC 30.1  --   RDW 16.9*  --   LYMPHSABS 3.4  --   MONOABS 1.3*  --   EOSABS 0.4  --   BASOSABS 0.1  --    CMP   Recent Labs  07/14/14 2044 07/14/14 2059  NA 137 140  K 4.0 3.9  CL 103 102  CO2 27  --   GLUCOSE 90 91  BUN 19 21  CREATININE 1.65* 1.70*  CALCIUM 9.1  --   GFRNONAA 31*  --   GFRAA 36*  --    BNP (last 3 results)  Recent Labs  04/04/14 1251  PROBNP 7846.0*   Cardiac Panel (last 3 results) Troponin (Point of Care Test)  Recent Labs  07/14/14 2058  TROPIPOC 0.02   Thyroid  Lab Results  Component Value Date   TSH 3.990 04/04/2014    EKG: Atrial fibrillation, lateral ST segment depression inferior infarct age undetermined, right bundle branch block  Radiology: Mild cardiomegaly, basilar atelectasis   IMPRESSIONS: 1.  Chest discomfort prolonged with minimal EKG  changes and  no significant initial troponin elevation possible unstable angina 2.  History of severe coronary artery disease not felt to be good candidate for percutaneous intervention or bypass grafting 3.  Chronic atrial fibrillation 4.  History of GI bleeding possibly due to AVMs 5.  Stage III chronic kidney disease 6.  COPD 7.  Hypertension 8.  Hyperlipidemia  PLAN: Patient will be heparinized and placed on nitroglycerin.  Cycle troponins.  Physicians in the morning will need to review carefully whether she is an interventional candidate at all as she was previously not felt to be a good candidate for bypass grafting and has multiple comorbidities for percutaneous intervention.    Signed: Kerry Hough MD [facility]  07/15/2014, 12:02 AM

## 2014-07-15 NOTE — Progress Notes (Signed)
Echocardiogram 2D Echocardiogram has been performed.  Alexis Dillon 07/15/2014, 9:55 AM

## 2014-07-15 NOTE — ED Notes (Signed)
Attempted report, will call admitting about pts bed placement.

## 2014-07-15 NOTE — Progress Notes (Addendum)
Progress Note  Subjective:    Doing quite well this AM, not having any pain or SOB. Denies dizziness, lightheadedness, or palpitations.   Objective:   Temp:  [97.3 F (36.3 C)-98.6 F (37 C)] 97.3 F (36.3 C) (02/29 0333) Pulse Rate:  [53-84] 68 (02/29 0700) Resp:  [12-29] 13 (02/29 0700) BP: (103-170)/(43-91) 119/56 mmHg (02/29 0700) SpO2:  [88 %-100 %] 94 % (02/29 0700) Weight:  [92 lb 12.8 oz (42.094 kg)-99 lb (44.906 kg)] 93 lb 4.1 oz (42.3 kg) (02/29 0405) Last BM Date: 07/14/14  Filed Weights   07/14/14 2051 07/14/14 2054 07/15/14 0405  Weight: 99 lb (44.906 kg) 92 lb 12.8 oz (42.094 kg) 93 lb 4.1 oz (42.3 kg)    Intake/Output Summary (Last 24 hours) at 07/15/14 0737 Last data filed at 07/15/14 0700  Gross per 24 hour  Intake 245.29 ml  Output    175 ml  Net  70.29 ml    Telemetry: NSR @ 73 bpm  Physical Exam: General: Pleasant elderly white female, alert, cooperative, NAD. HEENT: PERRL, EOMI. Moist mucus membranes Neck: Full range of motion without pain, supple, no lymphadenopathy or carotid bruits Lungs: Clear to ascultation bilaterally, normal work of respiration, no wheezes, rales, rhonchi Heart: RRR, no murmurs, gallops, or rubs Abdomen: Soft, non-tender, non-distended, BS + Extremities: No cyanosis, clubbing, or edema. Thin extremities w/ excoriations over legs Neurologic: Cranial nerves II-XII intact, strength grossly intact, sensation intact to light touch   Lab Results:  Basic Metabolic Panel:  Recent Labs Lab 07/14/14 2044 07/14/14 2059 07/15/14 0410  NA 137 140 139  K 4.0 3.9 3.2*  CL 103 102 104  CO2 27  --  30  GLUCOSE 90 91 93  BUN 19 21 18   CREATININE 1.65* 1.70* 1.84*  CALCIUM 9.1  --  8.7    CBC:  Recent Labs Lab 07/14/14 2044 07/14/14 2059  WBC 13.4*  --   HGB 11.3* 13.3  HCT 37.5 39.0  MCV 87.2  --   PLT 344  --     Cardiac Enzymes:  Recent Labs Lab 07/15/14 0410  TROPONINI 0.03    BNP:  Recent Labs  04/04/14 1251  PROBNP 7846.0*    Coagulation:  Recent Labs Lab 07/14/14 2044  INR 1.01   Lipid Panel     Component Value Date/Time   CHOL 107 07/15/2014 0410   TRIG 112 07/15/2014 0410   HDL 44 07/15/2014 0410   CHOLHDL 2.4 07/15/2014 0410   VLDL 22 07/15/2014 0410   LDLCALC 41 07/15/2014 0410    Radiology: Dg Chest Portable 1 View  07/14/2014   CLINICAL DATA:  Acute onset of shortness of breath and chest pain. Recent myocardial infarction. Initial encounter.  EXAM: PORTABLE CHEST - 1 VIEW  COMPARISON:  Chest radiograph performed 05/16/2014  FINDINGS: The lungs are well-aerated. Bibasilar opacities likely reflect atelectasis. There is no evidence of pleural effusion or pneumothorax.  The cardiomediastinal silhouette is mildly enlarged. The patient is status post median sternotomy. No acute osseous abnormalities are seen. A skin fold is noted overlying the left hemithorax.  IMPRESSION: Bibasilar opacities likely reflect atelectasis; lungs otherwise clear. Mild cardiomegaly noted.   Electronically Signed   By: Garald Balding M.D.   On: 07/14/2014 21:12     ECG: NSR, LVH, lateral ST depressions, prolonged QTc   Medications:   Scheduled Medications: . antiseptic oral rinse  7 mL Mouth Rinse BID  . [START ON 07/16/2014] aspirin EC  81 mg  Oral Daily  . atorvastatin  80 mg Oral Daily  . carvedilol  6.25 mg Oral BID WC  . furosemide  20 mg Oral Daily  . pantoprazole  40 mg Oral Daily  . penicillin v potassium  500 mg Oral QID     Infusions: . sodium chloride 20 mL/hr at 07/15/14 0340  . heparin 500 Units/hr (07/15/14 0340)     PRN Medications:  acetaminophen, albuterol, nitroGLYCERIN, ondansetron (ZOFRAN) IV   Assessment and Plan:  69 y/o F w/ PMHx of CKD stage 3, ASD s/p repair, mild MR, atrial fibrillation, chronic combined CHF (EF of 50-09%, grade 3 diastolic dysfunction), ICM, and CAD (cath 04/2013 showed RCA, LCx occlusion, 50% mid-LAD stenosis, 80% distal stenosis,  and first diagonal branch w/ 75% ostial stenosis), admitted for severe chest pain.  Chest pain: Patient w/ very complex cardiac history, presented w/ severe chest pain. Initially a code STEMI d/t inferolateral ST depressions on EKG (present from before), but w/ negative troponin on admission. Data reviewed by interventional cardiology, felt that given her multiple co-morbidities, she was not a good candidate for emergent cath. Patient had previously been evaluated for CABG, however d/t renal insufficiency and mild MR, the decision was previously made to manage her medically. This AM, patient denies pain or SOB. -Continue ASA, Lipitor 80 mg qhs -Continue to cycle troponins -Continue Heparin gtt for now -Coreg 6.25 mg bid -ECHO pending  Chronic Combined CHF: Previous ECHO from 03/2014 showed EF of 30-35%, mild LVH, and grade 3 diastolic dysfunction. Appears euvolemic at this time.  -Continue Lasix 20 mg daily -Coreg as above -ECHO   Atrial Fibrillation: Currently NSR. Had previously been on Eliquis but this was stopped d/t GIB in the past. The patient's CHA2DS2-VASc Score and unadjusted Ischemic Stroke Rate (% per year) is equal to 7.2 % stroke rate/year from a score of 5. Patient also has Amiodarone 200 mg daily listed on her home med list. TSH from 03/2014 3.99. -Continue ASA -Heparin gtt as above -Coreg  CKD stage 3: Baseline Cr ~1.6-1.8. Cr close to baseline this AM (1.84). Obvious risk for LHC given renal insufficiency.  -Repeat BMP in AM   Natasha Bence, MD PGY-2 Internal Medicine Pager: [phone]   Patient seen and examined. Agree with assessment and plan. Currently pain free. Echo being done now; preliminary review by me reveals EF improved in the 45% range with inferior/ low posterolateral and basal inferolateral wall motion abnormalities c/w prior RCA and Lcx occlusions with mild MR.  Will dc IV Ntg and add imdur 30 mg daily. Maintaining NSR without AF on amiodarone and coreg. Not on  ACE-I with renal insufficiency. ECG today independently read by me reveals NSR at 67; LVH with repolarization; old IMI and increased QTc.  With h/o of recent GI bleed not on eliquis anticoagulation since GI bleed 2-3 mo ago. LDL 41on atorvastatin. DC heparin; if recurrent symptoms then may need to proceed with cath. Consider adding amlodipine for antianginal benefit and possibly ranolazine in the future.    Troy Sine, MD, [facility] 07/15/2014 10:16 AM

## 2014-07-15 NOTE — Care Management Note (Signed)
    Page 1 of 1   07/15/2014     11:06:27 AM CARE MANAGEMENT NOTE 07/15/2014  Patient:  Alexis Dillon, Alexis Dillon   Account Number:  [id number]  Date Initiated:  07/15/2014  Documentation initiated by:  Elissa Hefty  Subjective/Objective Assessment:   adm w angina     Action/Plan:   lives w sister, pcp dr ed Luan Pulling   Anticipated DC Date:     Anticipated DC Plan:  HOME/SELF CARE         Choice offered to / List presented to:             Status of service:   Medicare Important Message given?   (If response is "NO", the following Medicare IM given date fields will be blank) Date Medicare IM given:   Medicare IM given by:   Date Additional Medicare IM given:   Additional Medicare IM given by:    Discharge Disposition:    Per UR Regulation:  Reviewed for med. necessity/level of care/duration of stay  If discussed at [place] of Stay Meetings, dates discussed:    Comments:

## 2014-07-15 NOTE — Progress Notes (Signed)
ANTICOAGULATION CONSULT NOTE - Initial Consult  Pharmacy Consult for Heparin Indication: chest pain/ACS  No Known Allergies  Patient Measurements: Height: 5\' 4"  (162.6 cm) Weight: 93 lb 4.1 oz (42.3 kg) IBW/kg (Calculated) : 54.7 Heparin Dosing Weight: 42 kg  Vital Signs: Temp: 98.3 F (36.8 C) (02/29 0802) Temp Source: Oral (02/29 0802) BP: 138/83 mmHg (02/29 0900) Pulse Rate: 73 (02/29 0900)  Labs:  Recent Labs  07/14/14 2044 07/14/14 2059 07/15/14 0410 07/15/14 0750  HGB 11.3* 13.3  --   --   HCT 37.5 39.0  --   --   PLT 344  --   --   --   APTT 33  --   --   --   LABPROT 13.4  --   --   --   INR 1.01  --   --   --   HEPARINUNFRC  --   --   --  0.15*  CREATININE 1.65* 1.70* 1.84*  --   TROPONINI  --   --  0.03  --     Estimated Creatinine Clearance: 19.3 mL/min (by C-G formula based on Cr of 1.84).   Medical History: Past Medical History  Diagnosis Date  . COPD (chronic obstructive pulmonary disease)   . Tobacco abuse   . Hypertension   . History of atrial septal defect     Closure in 50's   . Mental retardation, idiopathic mild   . Arthritis   . NSTEMI (non-ST elevated myocardial infarction)   . Chronic atrial fibrillation 04/2013    S/P TEE/DCCV  . GERD (gastroesophageal reflux disease)   . Atrophy of right kidney   . Chronic systolic CHF (congestive heart failure)   . Diverticulosis   . Hiatal hernia   . Barrett esophagus   . Ischemic cardiomyopathy     EF of 25%  . CAD (coronary artery disease) 04/2013    oronary dominance: right Left mainstem: LM calcified with long 30% stenosis.     Assessment: 47 YOF who presented on 2/28 with CP and initially CODE STEMI called however cancelled after MD review. Pharmacy consulted to dose Heparin.  PTA:  There is mention of the patient being on apixaban PTA however the patient claimed to have stopped this - was not any anticoaguation PTA.   Coag:ACS, EF 30%, afib, CHADS VASC 5  heparin level less  than goal, no bleeding noted. ASA, amio, atorva, coreg, fuor, imdur, K 3.2 (supped with 40)  ID Penicillin, started PTA 2/11 by ED, patient bitten by a rat, treated  to end 3/5 per outpatient Rx  Renal: CrCl ~ 20 ml/min  Goal of Therapy:  Heparin level 0.3-0.7 units/ml Monitor platelets by anticoagulation protocol: Yes   Plan:  Heparin bolus of 1000 units x 1 Initiate heparin drip at a rate of 650 units/hr (6.5 ml/hr) Will continue to monitor for any signs/symptoms of bleeding and will follow up with heparin level in 8 hours   Thank you for allowing pharmacy to be a part of this patients care team.  Rowe Robert Pharm.D., BCPS, AQ-Cardiology Clinical Pharmacist 07/15/2014 10:22 AM Pager: [phone] Phone: [phone]

## 2014-07-16 ENCOUNTER — Encounter (HOSPITAL_COMMUNITY): Admission: RE | Admit: 2014-07-16 | Payer: Medicare Other | Source: Ambulatory Visit

## 2014-07-16 DIAGNOSIS — K449 Diaphragmatic hernia without obstruction or gangrene: Secondary | ICD-10-CM | POA: Diagnosis not present

## 2014-07-16 DIAGNOSIS — Z96641 Presence of right artificial hip joint: Secondary | ICD-10-CM | POA: Diagnosis not present

## 2014-07-16 DIAGNOSIS — E785 Hyperlipidemia, unspecified: Secondary | ICD-10-CM | POA: Diagnosis not present

## 2014-07-16 DIAGNOSIS — M1990 Unspecified osteoarthritis, unspecified site: Secondary | ICD-10-CM | POA: Diagnosis not present

## 2014-07-16 DIAGNOSIS — E875 Hyperkalemia: Secondary | ICD-10-CM | POA: Diagnosis not present

## 2014-07-16 DIAGNOSIS — J449 Chronic obstructive pulmonary disease, unspecified: Secondary | ICD-10-CM | POA: Diagnosis not present

## 2014-07-16 DIAGNOSIS — I129 Hypertensive chronic kidney disease with stage 1 through stage 4 chronic kidney disease, or unspecified chronic kidney disease: Secondary | ICD-10-CM | POA: Diagnosis not present

## 2014-07-16 DIAGNOSIS — Z7982 Long term (current) use of aspirin: Secondary | ICD-10-CM | POA: Diagnosis not present

## 2014-07-16 DIAGNOSIS — I480 Paroxysmal atrial fibrillation: Secondary | ICD-10-CM | POA: Diagnosis not present

## 2014-07-16 DIAGNOSIS — D649 Anemia, unspecified: Secondary | ICD-10-CM | POA: Diagnosis not present

## 2014-07-16 DIAGNOSIS — I2510 Atherosclerotic heart disease of native coronary artery without angina pectoris: Secondary | ICD-10-CM

## 2014-07-16 DIAGNOSIS — Z23 Encounter for immunization: Secondary | ICD-10-CM | POA: Diagnosis not present

## 2014-07-16 DIAGNOSIS — K219 Gastro-esophageal reflux disease without esophagitis: Secondary | ICD-10-CM | POA: Diagnosis not present

## 2014-07-16 DIAGNOSIS — F70 Mild intellectual disabilities: Secondary | ICD-10-CM | POA: Diagnosis not present

## 2014-07-16 DIAGNOSIS — I255 Ischemic cardiomyopathy: Secondary | ICD-10-CM | POA: Diagnosis not present

## 2014-07-16 DIAGNOSIS — Z9981 Dependence on supplemental oxygen: Secondary | ICD-10-CM | POA: Diagnosis not present

## 2014-07-16 DIAGNOSIS — K2270 Barrett's esophagus without dysplasia: Secondary | ICD-10-CM | POA: Diagnosis not present

## 2014-07-16 DIAGNOSIS — I482 Chronic atrial fibrillation: Secondary | ICD-10-CM | POA: Diagnosis not present

## 2014-07-16 DIAGNOSIS — I5042 Chronic combined systolic (congestive) and diastolic (congestive) heart failure: Secondary | ICD-10-CM | POA: Diagnosis not present

## 2014-07-16 DIAGNOSIS — Z90710 Acquired absence of both cervix and uterus: Secondary | ICD-10-CM | POA: Diagnosis not present

## 2014-07-16 DIAGNOSIS — N183 Chronic kidney disease, stage 3 (moderate): Secondary | ICD-10-CM | POA: Diagnosis not present

## 2014-07-16 DIAGNOSIS — I25110 Atherosclerotic heart disease of native coronary artery with unstable angina pectoris: Secondary | ICD-10-CM | POA: Diagnosis not present

## 2014-07-16 DIAGNOSIS — Z87891 Personal history of nicotine dependence: Secondary | ICD-10-CM | POA: Diagnosis not present

## 2014-07-16 DIAGNOSIS — Q2733 Arteriovenous malformation of digestive system vessel: Secondary | ICD-10-CM | POA: Diagnosis not present

## 2014-07-16 LAB — BASIC METABOLIC PANEL
ANION GAP: 9 (ref 5–15)
BUN: 22 mg/dL (ref 6–23)
CALCIUM: 8.5 mg/dL (ref 8.4–10.5)
CO2: 27 mmol/L (ref 19–32)
CREATININE: 1.87 mg/dL — AB (ref 0.50–1.10)
Chloride: 106 mmol/L (ref 96–112)
GFR, EST AFRICAN AMERICAN: 31 mL/min — AB (ref >90)
GFR, EST NON AFRICAN AMERICAN: 26 mL/min — AB (ref >90)
Glucose, Bld: 95 mg/dL (ref 70–99)
Potassium: 4.4 mmol/L (ref 3.5–5.1)
SODIUM: 142 mmol/L (ref 135–145)

## 2014-07-16 LAB — MAGNESIUM: MAGNESIUM: 1.9 mg/dL (ref 1.5–2.5)

## 2014-07-16 LAB — CBC
HCT: 31.2 % — ABNORMAL LOW (ref 36.0–46.0)
Hemoglobin: 9.5 g/dL — ABNORMAL LOW (ref 12.0–15.0)
MCH: 27.3 pg (ref 26.0–34.0)
MCHC: 30.4 g/dL (ref 30.0–36.0)
MCV: 89.7 fL (ref 78.0–100.0)
PLATELETS: 287 10*3/uL (ref 150–400)
RBC: 3.48 MIL/uL — AB (ref 3.87–5.11)
RDW: 16.9 % — AB (ref 11.5–15.5)
WBC: 7.6 10*3/uL (ref 4.0–10.5)

## 2014-07-16 MED ORDER — TRAMADOL HCL 50 MG PO TABS
50.0000 mg | ORAL_TABLET | Freq: Once | ORAL | Status: AC
  Administered 2014-07-16: 50 mg via ORAL
  Filled 2014-07-16: qty 1

## 2014-07-16 MED ORDER — RABIES VACCINE, PCEC IM SUSR
1.0000 mL | Freq: Once | INTRAMUSCULAR | Status: AC
  Administered 2014-07-16: 1 mL via INTRAMUSCULAR
  Filled 2014-07-16: qty 1

## 2014-07-16 NOTE — Progress Notes (Signed)
Last dose of anti-rabies vaccine  Injected to right deltoid tolerated well. Copy of the record faxed to [place] [facility].

## 2014-07-16 NOTE — Progress Notes (Signed)
Transferred to [facility] room 17 by wheelchair, stable, report given to RN. Belongings with pt.

## 2014-07-16 NOTE — Progress Notes (Signed)
Subjective:  No recurrent chest pain.   Objective:   Vital Signs : Filed Vitals:   07/16/14 0000 07/16/14 0400 07/16/14 0800 07/16/14 1100  BP: 104/49 108/52 115/59 124/57  Pulse: 58 66 78 66  Temp: 98.2 F (36.8 C) 97.4 F (36.3 C) 97.9 F (36.6 C) 97.7 F (36.5 C)  TempSrc: Oral Oral Oral Oral  Resp: 17 18 18 21   Height:  5' (1.524 m)    Weight:  93 lb 11.1 oz (42.5 kg)    SpO2: 97% 96% 95% 96%    Intake/Output from previous day:  Intake/Output Summary (Last 24 hours) at 07/16/14 1134 Last data filed at 07/16/14 0857  Gross per 24 hour  Intake  505.5 ml  Output   1476 ml  Net -970.5 ml     I/O since admission: -506   Wt Readings from Last 3 Encounters:  07/16/14 93 lb 11.1 oz (42.5 kg)  06/27/14 94 lb 9.6 oz (42.91 kg)  04/10/14 102 lb (46.267 kg)    Medications: . amiodarone  200 mg Oral Daily  . amLODipine  2.5 mg Oral Daily  . antiseptic oral rinse  7 mL Mouth Rinse BID  . aspirin EC  81 mg Oral Daily  . atorvastatin  80 mg Oral Daily  . carvedilol  6.25 mg Oral BID WC  . furosemide  20 mg Oral Daily  . isosorbide mononitrate  30 mg Oral Daily  . pantoprazole  40 mg Oral Daily  . penicillin v potassium  500 mg Oral QID  . rabies vaccine  1 mL Intramuscular Once    . sodium chloride 20 mL/hr at 07/15/14 0340    Physical Exam:   General appearance: alert, cooperative and appears older than stated age Neck: no adenopathy, no carotid bruit, no JVD, supple, symmetrical, trachea midline and thyroid not enlarged, symmetric, no tenderness/mass/nodules Lungs: diffusely decreased BS; no wheezing Heart: regular rate and rhythm Abdomen: soft, non-tender; bowel sounds normal; no masses,  no organomegaly Extremities: no edema, redness or tenderness in the calves or thighs Skin: Skin color, texture, turgor normal. No rashes or lesions or pale; no rashes Neurologic: Grossly normal   Rate: 66  Rhythm: normal sinus rhythm  ECG (independently read by me):  NSR at 67 with inferior and anterolateral T wave abnormalities.  Lab Results:  BMP Latest Ref Rng 07/16/2014 07/15/2014 07/14/2014  Glucose 70 - 99 mg/dL 95 93 91  BUN 6 - 23 mg/dL 22 18 21   Creatinine 0.50 - 1.10 mg/dL 1.87(H) 1.84(H) 1.70(H)  Sodium 135 - 145 mmol/L 142 139 140  Potassium 3.5 - 5.1 mmol/L 4.4 3.2(L) 3.9  Chloride 96 - 112 mmol/L 106 104 102  CO2 19 - 32 mmol/L 27 30 -  Calcium 8.4 - 10.5 mg/dL 8.5 8.7 -     CBC Latest Ref Rng 07/16/2014 07/14/2014 07/14/2014  WBC 4.0 - 10.5 K/uL 7.6 - 13.4(H)  Hemoglobin 12.0 - 15.0 g/dL 9.5(L) 13.3 11.3(L)  Hematocrit 36.0 - 46.0 % 31.2(L) 39.0 37.5  Platelets 150 - 400 K/uL 287 - 344      Recent Labs  07/15/14 0906 07/15/14 1532  TROPONINI <0.03 <0.03    Hepatic Function Panel No results for input(s): PROT, ALBUMIN, AST, ALT, ALKPHOS, BILITOT, BILIDIR, IBILI in the last 72 hours.  Recent Labs  07/14/14 2044  INR 1.01   BNP (last 3 results)  Recent Labs  07/14/14 2044  BNP 510.9*    ProBNP (last 3 results)  Recent Labs  04/04/14 1251  PROBNP 7846.0*     Lipid Panel     Component Value Date/Time   CHOL 107 07/15/2014 0410   TRIG 112 07/15/2014 0410   HDL 44 07/15/2014 0410   CHOLHDL 2.4 07/15/2014 0410   VLDL 22 07/15/2014 0410   LDLCALC 41 07/15/2014 0410      Imaging:  Dg Chest Portable 1 View  07/14/2014   CLINICAL DATA:  Acute onset of shortness of breath and chest pain. Recent myocardial infarction. Initial encounter.  EXAM: PORTABLE CHEST - 1 VIEW  COMPARISON:  Chest radiograph performed 05/16/2014  FINDINGS: The lungs are well-aerated. Bibasilar opacities likely reflect atelectasis. There is no evidence of pleural effusion or pneumothorax.  The cardiomediastinal silhouette is mildly enlarged. The patient is status post median sternotomy. No acute osseous abnormalities are seen. A skin fold is noted overlying the left hemithorax.  IMPRESSION: Bibasilar opacities likely reflect atelectasis;  lungs otherwise clear. Mild cardiomegaly noted.   Electronically Signed   By: Garald Balding M.D.   On: 07/14/2014 21:12    Study Conclusions  - HPI and indications: Cardiomyopathy. - Left ventricle: The cavity size was normal. Wall thickness was normal. Systolic function was moderately to severely reduced. The estimated ejection fraction was in the range of 30% to 35%. Inferolateral akinesis, basal to mid anterolateral akinesis, basal inferior aneurysm, mid inferior akinesis. Features are consistent with a pseudonormal left ventricular filling pattern, with concomitant abnormal relaxation and increased filling pressure (grade 2 diastolic dysfunction). - Aortic valve: There was no stenosis. There was trivial regurgitation. - Mitral valve: There was mild to moderate regurgitation. - Left atrium: The atrium was mildly dilated. - Right ventricle: The cavity size was mildly dilated. Systolic function was mildly reduced. - Tricuspid valve: Peak RV-RA gradient (S): 30 mm Hg. - Pulmonary arteries: PA peak pressure: 33 mm Hg (S). - Inferior vena cava: The vessel was normal in size. The respirophasic diameter changes were in the normal range (>= 50%), consistent with normal central venous pressure.  Impressions:  - Normal LV size with EF 30-35%. Wall motion abnormalities as noted above. Moderate diastolic dysfunction. Mild to moderate MR. Mildly dilated RV with mildly decreased systolic function.   Assessment/Plan:   Active Problems:   Unstable angina  69 y/o F w/ PMHx of CKD stage 3, ASD s/p repair, mild MR, atrial fibrillation, chronic combined CHF (EF of 10-25%, grade 3 diastolic dysfunction), ICM, and CAD (cath 04/2013 showed RCA, LCx occlusion, 50% mid-LAD stenosis, 80% distal stenosis, and first diagonal branch w/ 75% ostial stenosis), admitted for severe chest pain.  Chest pain: Patient w/ very complex cardiac history, presented w/ severe chest pain.  Initially a code STEMI d/t inferolateral ST depressions on EKG (present from before), but w/ negative troponin on admission. Data reviewed by interventional cardiology, felt that given her multiple co-morbidities, she was not a good candidate for emergent cath. Patient had previously been evaluated for CABG, however d/t renal insufficiency and mild MR, the decision was previously made to manage her medically. This AM, patient denies pain or SOB. -Continue ASA, Lipitor 80 mg qhs -troponins are negative - now off heparin -Coreg 6.25 mg bid - Tolerating the addition on imdur;  Consider ranolazine with occluded RCA and LCX.   Chronic Combined CHF: Previous ECHO from 03/2014 showed EF of 30-35%, mild LVH, and grade 3 diastolic dysfunction. Appears euvolemic at this time.  -Continue Lasix 20 mg daily -Coreg as above   Atrial Fibrillation: Currently NSR. Had previously  been on Eliquis but this was stopped d/t GIB in the past. The patient's CHA2DS2-VASc Score and unadjusted Ischemic Stroke Rate (% per year) is equal to 7.2 % stroke rate/year from a score of 5. Patient also has Amiodarone 200 mg daily listed on her home med list. TSH from 03/2014 3.99. -Continue ASA -Coreg  HLD: on atorvatatin   CKD stage 3: Baseline Cr ~1.6-1.8. Cr close to baseline this AM (1.84). Obvious risk for LHC given renal insufficiency.    Creatinine today 1.87;   S/P Rat Bite: Getting rabies vaccination; due for dose today per vaccination schedule (had been administered at [facility]) completed antiobiotics for hand bite.  Tobacco Abuse: discussed smoking cessation   Will transfer to telemetry today; ? Dc tomorrow if stable.      Troy Sine, MD, [facility] 07/16/2014, 11:34 AM

## 2014-07-17 LAB — CBC
HEMATOCRIT: 35.2 % — AB (ref 36.0–46.0)
HEMOGLOBIN: 10.5 g/dL — AB (ref 12.0–15.0)
MCH: 26.9 pg (ref 26.0–34.0)
MCHC: 29.8 g/dL — ABNORMAL LOW (ref 30.0–36.0)
MCV: 90 fL (ref 78.0–100.0)
Platelets: 292 10*3/uL (ref 150–400)
RBC: 3.91 MIL/uL (ref 3.87–5.11)
RDW: 16.7 % — AB (ref 11.5–15.5)
WBC: 7.9 10*3/uL (ref 4.0–10.5)

## 2014-07-17 LAB — BASIC METABOLIC PANEL
Anion gap: 4 — ABNORMAL LOW (ref 5–15)
BUN: 26 mg/dL — ABNORMAL HIGH (ref 6–23)
CALCIUM: 9 mg/dL (ref 8.4–10.5)
CO2: 30 mmol/L (ref 19–32)
Chloride: 102 mmol/L (ref 96–112)
Creatinine, Ser: 1.82 mg/dL — ABNORMAL HIGH (ref 0.50–1.10)
GFR calc Af Amer: 32 mL/min — ABNORMAL LOW (ref >90)
GFR, EST NON AFRICAN AMERICAN: 27 mL/min — AB (ref >90)
Glucose, Bld: 99 mg/dL (ref 70–99)
Potassium: 5.3 mmol/L — ABNORMAL HIGH (ref 3.5–5.1)
Sodium: 136 mmol/L (ref 135–145)

## 2014-07-17 LAB — POTASSIUM
POTASSIUM: 5.4 mmol/L — AB (ref 3.5–5.1)
Potassium: 5.4 mmol/L — ABNORMAL HIGH (ref 3.5–5.1)

## 2014-07-17 MED ORDER — HYDROCORTISONE 0.5 % EX CREA
TOPICAL_CREAM | CUTANEOUS | Status: DC | PRN
  Administered 2014-07-17: 03:00:00 via TOPICAL
  Filled 2014-07-17: qty 28.35

## 2014-07-17 NOTE — Progress Notes (Signed)
Patient: Alexis Dillon / Admit Date: 07/14/2014 / Date of Encounter: 07/17/2014, 10:04 AM   Subjective: No CP or SOB today. Has not been out of bed. Denies recurrent bleeding.   Objective: Telemetry: NSR/SB Physical Exam: Blood pressure 116/57, pulse 58, temperature 97.8 F (36.6 C), temperature source Oral, resp. rate 18, height 5\' 4"  (1.626 m), weight 94 lb 4.8 oz (42.774 kg), SpO2 98 %. General: Well developed WF in no acute distress, appears older than stated age, frail and thin Head: Normocephalic, atraumatic, sclera non-icteric, no xanthomas, nares are without discharge. Neck: JVP not elevated. Lungs: Rhonchorous throughout bilaterally to auscultation without wheezing or rales. Breathing is unlabored. Heart: RRR S1 S2 without murmurs, rubs, or gallops.  Abdomen: Soft, non-tender, non-distended with normoactive bowel sounds. No rebound/guarding. Extremities: No clubbing or cyanosis. No edema. Distal pedal pulses are 2+ and equal bilaterally. Neuro: Alert and oriented X 3. Moves all extremities spontaneously. Psych:  Responds to questions appropriately with a normal affect.   Intake/Output Summary (Last 24 hours) at 07/17/14 1004 Last data filed at 07/17/14 0853  Gross per 24 hour  Intake    997 ml  Output   1500 ml  Net   -503 ml    Inpatient Medications:  . amiodarone  200 mg Oral Daily  . amLODipine  2.5 mg Oral Daily  . antiseptic oral rinse  7 mL Mouth Rinse BID  . aspirin EC  81 mg Oral Daily  . atorvastatin  80 mg Oral Daily  . carvedilol  6.25 mg Oral BID WC  . furosemide  20 mg Oral Daily  . isosorbide mononitrate  30 mg Oral Daily  . pantoprazole  40 mg Oral Daily  . penicillin v potassium  500 mg Oral QID   Infusions:  . sodium chloride 20 mL/hr at 07/15/14 0340    Labs:  Recent Labs  07/15/14 0410 07/16/14 0235  NA 139 142  K 3.2* 4.4  CL 104 106  CO2 30 27  GLUCOSE 93 95  BUN 18 22  CREATININE 1.84* 1.87*  CALCIUM 8.7 8.5  MG  --  1.9   No  results for input(s): AST, ALT, ALKPHOS, BILITOT, PROT, ALBUMIN in the last 72 hours.  Recent Labs  07/14/14 2044 07/14/14 2059 07/16/14 0235  WBC 13.4*  --  7.6  NEUTROABS 8.2*  --   --   HGB 11.3* 13.3 9.5*  HCT 37.5 39.0 31.2*  MCV 87.2  --  89.7  PLT 344  --  287    Recent Labs  07/15/14 0410 07/15/14 0906 07/15/14 1532  TROPONINI 0.03 <0.03 <0.03   Invalid input(s): POCBNP No results for input(s): HGBA1C in the last 72 hours.   Radiology/Studies:  Dg Chest Portable 1 View  07/14/2014   CLINICAL DATA:  Acute onset of shortness of breath and chest pain. Recent myocardial infarction. Initial encounter.  EXAM: PORTABLE CHEST - 1 VIEW  COMPARISON:  Chest radiograph performed 05/16/2014  FINDINGS: The lungs are well-aerated. Bibasilar opacities likely reflect atelectasis. There is no evidence of pleural effusion or pneumothorax.  The cardiomediastinal silhouette is mildly enlarged. The patient is status post median sternotomy. No acute osseous abnormalities are seen. A skin fold is noted overlying the left hemithorax.  IMPRESSION: Bibasilar opacities likely reflect atelectasis; lungs otherwise clear. Mild cardiomegaly noted.   Electronically Signed   By: Garald Balding M.D.   On: 07/14/2014 21:12     Assessment and Plan  69 y/o F w/ PMHx of CKD  stage 3, ASD s/p repair, mild MR, PAF (Eliquis previously stopped due to GIB) , chronic combined CHF (EF of 00-45%, grade 3 diastolic dysfunction)/ICM, and CAD (cath 04/2013 showed RCA, LCx occlusion, 50% mid-LAD stenosis, 80% distal stenosis, and first diagonal branch w/ 75% ostial stenosis) admitted for severe chest pain.  1. Chest pain/unstable angina  - initially a code STEMI d/t inferolateral ST depressions on EKG (present from before), but w/ negative troponin on admission. Data reviewed by interventional cardiology, felt that given her multiple co-morbidities, she was not a good candidate for emergent cath - patient has previously been  evaluated for CABG, however d/t renal insufficiency and mild MR, the decision was previously made to manage her medically - now off heparin with negative enzymes - continue aspirin, statin, BB, Imdur, Norvasc - if chest pain recurs consider repeat cardiac catheterization +/- addition of Ranexa  2. Anemia - Hgb 12->9.5 - repeat today stat and check hemoccult given h/o GIB - if labs stable will order PT/OT eval  3. Chronic combined CHF, euvolemic - echo 03/2014 showed EF of 30-35% - f/u echo 07/15/14 with EF 30-35%, +WMA, grade 2 DD, mild-mod MR - continue daily Lasix  4. Paroxysmal atrial fibrillation, maintaining NSR - previously on Eliquis but this was stopped d/t GIB/symptomatic anemia Hgb 6 in 03/2014 (colonoscopy - small bowel AVM/erosions) - continue amiodarone, Coreg  5. S/p rat bite - received 5 injections total - 2/11, 2/16, 2/19, 2/23, and 5th dose yesterday here - remains on antibiotics (course was planned for 2/11 + 14 days) - I will discuss plan with Dr. Stanford Breed  6. CKD stage III, repeat BMET ordered 7. COPD on home oxygen  Signed, Dayna Dunn PA-C  As above, patient seen and examined. She denies chest pain. Plan medical therapy for coronary artery disease as she has ruled out. Continue aspirin, statin, beta blocker and nitrates. No ACE inhibitor or even renal insufficiency. Continue amiodarone for history of atrial fibrillation. No anticoagulation given recent GI bleed. Will ask for ID input concerning recent rat bite and completion of rabies injections. Plan to recheck hemoglobin tomorrow morning. Ambulate today. If stable discharge tomorrow and follow-up in [facility]

## 2014-07-17 NOTE — Progress Notes (Addendum)
K on today's labs was 5.3. Rechecked on a new sample, it was 5.4 but with slight hemolysis. EKG without significant change from prior. No peaked T wave changes or QRS widening. Change diet to renal diet. Not on ACEI, ARB or spironolactone. Received KCl supplement several days ago but none in the last 2 days. She does remain on Pen V K (potassium) for her animal bite. However, this was started on 2/11 to complete a 14 day course which theoretically should be completed by now. Will hold further doses tonight and reassess potassium in the morning.  Lenny Fiumara PA-C

## 2014-07-17 NOTE — Evaluation (Signed)
Physical Therapy Evaluation Patient Details Name: Alexis Dillon MRN: [id number] DOB: 20-Mar-1946 Today's Date: 07/17/2014   History of Present Illness  Pt adm with chest pain. Neg troponins. Chronic combined heart failure. Pt with recent rat bite and receiving rabies shots. PMH - COPD, NSTEMI, HTN  Clinical Impression  Pt admitted with above diagnosis. Pt currently with functional limitations due to the deficits listed below (see PT Problem List).  Pt will benefit from skilled PT to increase their independence and safety with mobility to allow discharge to the venue listed below.  Pt close to baseline and should be able to return home with family. Primarily limited by dyspnea and fatigue.     Follow Up Recommendations Home health PT;Supervision - Intermittent    Equipment Recommendations  None recommended by PT    Recommendations for Other Services       Precautions / Restrictions Precautions Precautions: Fall      Mobility  Bed Mobility Overal bed mobility: Needs Assistance Bed Mobility: Supine to Sit     Supine to sit: Supervision;HOB elevated     General bed mobility comments: Incr time  Transfers Overall transfer level: Needs assistance Equipment used: Rolling walker (2 wheeled) Transfers: Sit to/from Stand Sit to Stand: Supervision         General transfer comment: Verbal cues for hand placement  Ambulation/Gait Ambulation/Gait assistance: Supervision Ambulation Distance (Feet): 100 Feet Assistive device: Rolling walker (2 wheeled) Gait Pattern/deviations: Step-through pattern;Decreased step length - right;Decreased step length - left;Trunk flexed   Gait velocity interpretation: Below normal speed for age/gender General Gait Details: Verbal cues to stand more erect. Dyspnea 3/4 with amb on 3L of O2. SaO2 94% with amb on 3L. SaO2 on RA at rest 88%.  Stairs            Wheelchair Mobility    Modified Rankin (Stroke Patients Only)       Balance  Overall balance assessment: Needs assistance Sitting-balance support: No upper extremity supported Sitting balance-Leahy Scale: Good     Standing balance support: Bilateral upper extremity supported Standing balance-Leahy Scale: Poor Standing balance comment: support of walker                             Pertinent Vitals/Pain Pain Assessment: No/denies pain    Home Living Family/patient expects to be discharged to:: Private residence Living Arrangements: Other relatives (sister) Available Help at Discharge: Family;Available 24 hours/day Type of Home: Mobile home Home Access: Ramped entrance     Home Layout: One level Home Equipment: [place] - single point;Walker - 2 wheels;Electric scooter      Prior Function Level of Independence: Needs assistance   Gait / Transfers Assistance Needed: walker or cane  ADL's / Homemaking Assistance Needed: assist with meals and housekeeping, SBA with bathing        Hand Dominance   Dominant Hand: Right    Extremity/Trunk Assessment   Upper Extremity Assessment: Generalized weakness           Lower Extremity Assessment: Generalized weakness         Communication   Communication: No difficulties  Cognition Arousal/Alertness: Awake/alert Behavior During Therapy: WFL for tasks assessed/performed Overall Cognitive Status: History of cognitive impairments - at baseline                      General Comments      Exercises        Assessment/Plan  PT Assessment Patient needs continued PT services  PT Diagnosis Difficulty walking;Generalized weakness   PT Problem List Decreased strength;Decreased activity tolerance;Decreased balance;Cardiopulmonary status limiting activity  PT Treatment Interventions DME instruction;Gait training;Patient/family education;Functional mobility training;Therapeutic activities;Therapeutic exercise;Balance training   PT Goals (Current goals can be found in the Care Plan  section) Acute Rehab PT Goals Patient Stated Goal: return home but wants to make sure rats are gone PT Goal Formulation: With patient Time For Goal Achievement: 07/24/14 Potential to Achieve Goals: Good    Frequency Min 3X/week   Barriers to discharge        Co-evaluation               End of Session Equipment Utilized During Treatment: Gait belt;Oxygen Activity Tolerance: Patient limited by fatigue Patient left: in chair;with call bell/phone within reach;with chair alarm set Nurse Communication: Mobility status         Time: 1045-1110 PT Time Calculation (min) (ACUTE ONLY): 25 min   Charges:   PT Evaluation $Initial PT Evaluation Tier I: 1 Procedure PT Treatments $Gait Training: 8-22 mins   PT G Codes:        Orpheus [place] 07/27/2014, 12:00 PM  [facility] [phone]

## 2014-07-17 NOTE — Progress Notes (Signed)
Reviewed rabies vaccine dosing regimen with Dr. Baxter Flattery with ID who feels the patient has completed adequate course of vaccines. Dayna Dunn PA-C

## 2014-07-18 ENCOUNTER — Encounter (HOSPITAL_COMMUNITY): Payer: Self-pay | Admitting: Physician Assistant

## 2014-07-18 DIAGNOSIS — E875 Hyperkalemia: Secondary | ICD-10-CM

## 2014-07-18 DIAGNOSIS — J9610 Chronic respiratory failure, unspecified whether with hypoxia or hypercapnia: Secondary | ICD-10-CM

## 2014-07-18 DIAGNOSIS — D649 Anemia, unspecified: Secondary | ICD-10-CM

## 2014-07-18 DIAGNOSIS — S61459A Open bite of unspecified hand, initial encounter: Secondary | ICD-10-CM

## 2014-07-18 DIAGNOSIS — I5042 Chronic combined systolic (congestive) and diastolic (congestive) heart failure: Secondary | ICD-10-CM

## 2014-07-18 LAB — CBC
HEMATOCRIT: 33.9 % — AB (ref 36.0–46.0)
HEMOGLOBIN: 10.3 g/dL — AB (ref 12.0–15.0)
MCH: 26.6 pg (ref 26.0–34.0)
MCHC: 30.4 g/dL (ref 30.0–36.0)
MCV: 87.6 fL (ref 78.0–100.0)
Platelets: 279 10*3/uL (ref 150–400)
RBC: 3.87 MIL/uL (ref 3.87–5.11)
RDW: 16.6 % — ABNORMAL HIGH (ref 11.5–15.5)
WBC: 8.5 10*3/uL (ref 4.0–10.5)

## 2014-07-18 LAB — BASIC METABOLIC PANEL
Anion gap: 10 (ref 5–15)
BUN: 31 mg/dL — AB (ref 6–23)
CALCIUM: 8.9 mg/dL (ref 8.4–10.5)
CO2: 27 mmol/L (ref 19–32)
CREATININE: 1.83 mg/dL — AB (ref 0.50–1.10)
Chloride: 98 mmol/L (ref 96–112)
GFR calc Af Amer: 31 mL/min — ABNORMAL LOW (ref >90)
GFR, EST NON AFRICAN AMERICAN: 27 mL/min — AB (ref >90)
GLUCOSE: 93 mg/dL (ref 70–99)
Potassium: 4.1 mmol/L (ref 3.5–5.1)
Sodium: 135 mmol/L (ref 135–145)

## 2014-07-18 MED ORDER — AMLODIPINE BESYLATE 2.5 MG PO TABS: 2.5000 mg | ORAL_TABLET | Freq: Every day | ORAL | Status: AC

## 2014-07-18 MED ORDER — ASPIRIN EC 81 MG PO TBEC: 81.0000 mg | DELAYED_RELEASE_TABLET | Freq: Every day | ORAL | Status: AC

## 2014-07-18 MED ORDER — NITROGLYCERIN 0.4 MG SL SUBL: 0.4000 mg | SUBLINGUAL_TABLET | SUBLINGUAL | Status: DC | PRN

## 2014-07-18 MED ORDER — ISOSORBIDE MONONITRATE ER 30 MG PO TB24: 30.0000 mg | ORAL_TABLET | Freq: Every day | ORAL | Status: DC

## 2014-07-18 MED ORDER — CARVEDILOL 6.25 MG PO TABS: 6.2500 mg | ORAL_TABLET | Freq: Two times a day (BID) | ORAL | Status: DC

## 2014-07-18 NOTE — Progress Notes (Signed)
Per case management, Alexis Dillon, HHPT set up and pt is ready to go from their standpoint.  I have gone over discharge paperwork with pt and daughterinlaw.  Neither have questions.  IV's removed by NT.  Pt in stable condition.  Awaiting ride by daughter-in-law who will bring pt's oxygen and clothing.   Graceann Congress

## 2014-07-18 NOTE — Discharge Summary (Signed)
Discharge Summary   Patient ID: Alexis Dillon MRN: [id number], DOB/AGE: [birth year] 69 y.o. Admit date: 07/14/2014 D/C date:     07/18/2014  Primary Care Provider: Alonza Bogus, MD Primary Cardiologist: Dr. Johnsie Cancel [facility])  Primary Discharge Diagnoses:  1. Chest pain/unstable angina/CAD, managed medically 2. Anemia, advised to f/u PCP 3. Chronic combined CHF/ICM, euvolemic - echo 03/2014 showed EF of 30-35% - f/u echo 07/15/14 with EF 30-35%, +WMA, grade 2 DD, mild-mod MR 4. Paroxysmal atrial fibrillation, maintaining NSR this admission - previously on Eliquis but this was stopped d/t GIB/symptomatic anemia Hgb 6 in 03/2014 (colonoscopy - small bowel AVM/erosions)  5. S/p rat bite, treated with abx and 5 rabies vaccines  6. Elevated potassium level, unclear if lab error or real 7. CKD stage III, creatinine stable 8. COPD on home oxygen  Secondary Discharge Diagnoses:  1. ASD repair in the 68s 2. Mild mental retardation 3. Tobacco abuse 4. Arthritis 5. GERD 6. Atrophy of right kidney 7. Diverticulosis 8. Hiatal hernia 9. Barrett esophagus  Hospital Course: 69 y/o F w/ PMHx of COPD on home O2, CKD stage 3, ASD s/p remote repair, mild-mod MR, PAF (Eliquis previously stopped due to GIB/symptomatic anemia in 03/2014) , chronic combined CHF (EF 30-35%), ICM, and CAD (cath 04/2013 showed RCA, LCx occlusion, 50% mid-LAD stenosis, 80% distal stenosis, and first diagonal branch w/ 75% ostial stenosis, managed medically). She was admitted 07/14/2014 for severe chest pain.   She was initially called a code STEMI d/t inferolateral ST depressions on EKG (present from before), but w/ negative troponin on admission. Data reviewed by interventional cardiology, felt that given her multiple co-morbidities, she was not a good candidate for emergent cath. Note she has previously been evaluated for CABG, however d/t renal insufficiency and mild MR, the decision was previously made to manage her  medically. She ruled out for MI. Her anti-anginal regimen was adjusted to include addition of Norvasc, Coreg, and Imdur. Her blood pressure may prohibit further titration. If chest pain recurs consider repeat cardiac catheterization +/- addition of Ranexa. From a CHF standpoint she was euvolemic during her stay. 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 She maintained NSR during her stay. By day of discharge she was ambulating with PT without chest pain. HHPT was ordered to increase endurance. Cr remained generally in the 1.8 range, baseline said to be 1.5-2.0. Dr. Stanford Breed has seen and examined the patient today and feels she is stable for discharge.  Other issues encountered this admission: 1) Anemia - Hgb 12->9.5, now stable in the 10 range - will ask her to f/u PCP closely  2) S/p rat bite - received 5 rabies raccine injections total - 2/11, 2/16, 2/19, 2/23, and 3/1 here - reviewed rabies vaccine dosing regimen with Dr. Baxter Flattery with ID who feels the patient has completed adequate course of vaccines - abx stopped yesterday due to course completed, elevated potassium level, no evidence of subsequent infection on examination of extremity, normal WBC count and afebrile  3) Hyperkalemia  - K was 5.3 yesterday, repeated and was 5.4 but second specimen with slight hemolysis - diet changed to renal, Pen V K (potassium) stopped as she had completed course based on timing of rx - consider repeat BMET as outpatient  Discharge Vitals: Blood pressure 118/57, pulse 64, temperature 97.3 F (36.3 C), temperature source Oral, resp. rate 18, height 5\' 4"  (1.626 m), weight 93 lb 11.2 oz (42.502 kg), SpO2 96 %.  Labs: Lab  Results  Component Value Date   WBC 8.5 07/18/2014   HGB 10.3* 07/18/2014   HCT 33.9* 07/18/2014   MCV 87.6 07/18/2014   PLT 279 07/18/2014    Recent Labs Lab 07/18/14 0435  NA 135  K 4.1  CL 98  CO2 27  BUN 31*  CREATININE 1.83*  CALCIUM 8.9  GLUCOSE 93      Recent Labs  07/15/14 1532  TROPONINI <0.03   Lab Results  Component Value Date   CHOL 107 07/15/2014   HDL 44 07/15/2014   LDLCALC 41 07/15/2014   TRIG 112 07/15/2014     Diagnostic Studies/Procedures   Dg Chest Portable 1 View  07/14/2014   CLINICAL DATA:  Acute onset of shortness of breath and chest pain. Recent myocardial infarction. Initial encounter.  EXAM: PORTABLE CHEST - 1 VIEW  COMPARISON:  Chest radiograph performed 05/16/2014  FINDINGS: The lungs are well-aerated. Bibasilar opacities likely reflect atelectasis. There is no evidence of pleural effusion or pneumothorax.  The cardiomediastinal silhouette is mildly enlarged. The patient is status post median sternotomy. No acute osseous abnormalities are seen. A skin fold is noted overlying the left hemithorax.  IMPRESSION: Bibasilar opacities likely reflect atelectasis; lungs otherwise clear. Mild cardiomegaly noted.   Electronically Signed   By: Garald Balding M.D.   On: 07/14/2014 21:12   2D echo as above.  Discharge Medications   Current Discharge Medication List    START taking these medications   Details  amLODipine (NORVASC) 2.5 MG tablet Take 1 tablet (2.5 mg total) by mouth daily. Qty: 30 tablet, Refills: 6    carvedilol (COREG) 6.25 MG tablet Take 1 tablet (6.25 mg total) by mouth 2 (two) times daily with a meal. Qty: 60 tablet, Refills: 6    isosorbide mononitrate (IMDUR) 30 MG 24 hr tablet Take 1 tablet (30 mg total) by mouth daily. Qty: 30 tablet, Refills: 6    nitroGLYCERIN (NITROSTAT) 0.4 MG SL tablet Place 1 tablet (0.4 mg total) under the tongue every 5 (five) minutes as needed for chest pain (up to 3 doses). Qty: 25 tablet, Refills: 3      CONTINUE these medications which have CHANGED   Details  aspirin EC 81 MG tablet Take 1 tablet (81 mg total) by mouth daily. Qty: 30 tablet, Refills: 6      CONTINUE these medications which have NOT CHANGED   Details  albuterol (PROVENTIL HFA;VENTOLIN  HFA) 108 (90 BASE) MCG/ACT inhaler Inhale 2 puffs into the lungs every 6 (six) hours as needed for wheezing or shortness of breath.    atorvastatin (LIPITOR) 80 MG tablet Take 80 mg by mouth daily.    furosemide (LASIX) 20 MG tablet Take 20 mg by mouth daily.    pantoprazole (PROTONIX) 40 MG tablet Take 40 mg by mouth daily.    acetaminophen (TYLENOL) 325 MG tablet Take 2 tablets (650 mg total) by mouth every 6 (six) hours as needed for mild pain or fever.    albuterol (PROVENTIL) (2.5 MG/3ML) 0.083% nebulizer solution Take 3 mLs (2.5 mg total) by nebulization every 2 (two) hours as needed for wheezing or shortness of breath.     ALPRAZolam (XANAX) 0.25 MG tablet Take 0.25 mg by mouth 3 (three) times daily as needed for anxiety.     amiodarone (PACERONE) 200 MG tablet Take 1 tablet (200 mg total) by mouth daily.     cholecalciferol (VITAMIN D) 1000 UNITS tablet Take 2,000 Units by mouth daily.    HYDROcodone-acetaminophen (  NORCO/VICODIN) 5-325 MG per tablet Take 1 tablet by mouth every 4 (four) hours as needed for moderate pain.     ipratropium-albuterol (DUONEB) 0.5-2.5 (3) MG/3ML SOLN Take 3 mLs by nebulization every 6 (six) hours as needed (SHORTNESS OF BREATH).    sodium chloride (OCEAN) 0.65 % SOLN nasal spray Place 1 spray into both nostrils as needed for congestion.       STOP taking these medications     ibuprofen (ADVIL,MOTRIN) 200 MG tablet      guaiFENesin (MUCINEX) 600 MG 12 hr tablet  - completed course      penicillin v potassium (VEETID) 500 MG tablet  - completed course      predniSONE (DELTASONE) 10 MG tablet  - completed course        Disposition   The patient will be discharged in stable condition to home. Discharge Instructions    Diet - low sodium heart healthy    Complete by:  As directed      Increase activity slowly    Complete by:  As directed   Patients with kidney problems should NOT take medicines like ibuprofen, Advil, Motrin, naproxen, and  Aleve due to risk of making kidney function worse. You may take Tylenol as directed or talk to your primary doctor about alternatives.          Follow-up Information    Follow up with Ermalinda Barrios, PA-C.   Specialty:  Cardiology   Why:  [place] Office - 07/31/14 at 11:20am   Contact information:   [place] [address] [phone]       Follow up with HAWKINS,EDWARD L, MD.   Specialty:  Pulmonary Disease   Why:  Please follow up with your primary care doctor for the following things: 1) Your rat bite. 2) Your blood counts showed mild anemia.    Contact information:   [place] [address] [phone]         Duration of Discharge Encounter: Greater than 30 minutes including physician and PA time.  Signed, Dayna Dunn PA-C 07/18/2014, 1:18 PM

## 2014-07-18 NOTE — Progress Notes (Signed)
Patient: Alexis Dillon / Admit Date: 07/14/2014 / Date of Encounter: 07/18/2014, 9:16 AM   Subjective: Hoping for good news that she can go home today. No chest pain with PT yesterday. Dyspnea improving. Uses nebs/inhalers at home to manage her COPD.   Objective: Telemetry: NSR Physical Exam: Blood pressure 108/51, pulse 59, temperature 97.2 F (36.2 C), temperature source Oral, resp. rate 18, height 5\' 4"  (1.626 m), weight 93 lb 11.2 oz (42.502 kg), SpO2 98 %. General: Well developed WF in no acute distress, appears older than stated age, frail and thin Head: Normocephalic, atraumatic, sclera non-icteric, no xanthomas, nares are without discharge. Neck: JVP not elevated. Lungs: Faint rhonchi/wheezing that were cleared with coughing with subsequent generally diminished BS. Breathing is unlabored. Heart: RRR S1 S2 without murmurs, rubs, or gallops.  Abdomen: Soft, non-tender, non-distended with normoactive bowel sounds. No rebound/guarding. Extremities: No clubbing or cyanosis. No edema. Distal pedal pulses are 2+ and equal bilaterally. Neuro: Alert and oriented X 3. Moves all extremities spontaneously. Psych: Responds to questions appropriately with a normal affect.   Intake/Output Summary (Last 24 hours) at 07/18/14 0916 Last data filed at 07/18/14 0851  Gross per 24 hour  Intake    870 ml  Output   1600 ml  Net   -730 ml    Inpatient Medications:  . amiodarone  200 mg Oral Daily  . amLODipine  2.5 mg Oral Daily  . antiseptic oral rinse  7 mL Mouth Rinse BID  . aspirin EC  81 mg Oral Daily  . atorvastatin  80 mg Oral Daily  . carvedilol  6.25 mg Oral BID WC  . furosemide  20 mg Oral Daily  . isosorbide mononitrate  30 mg Oral Daily  . pantoprazole  40 mg Oral Daily   Infusions:  . sodium chloride 20 mL/hr at 07/15/14 0340    Labs:  Recent Labs  07/16/14 0235 07/17/14 1141 07/17/14 1545 07/18/14 0435  NA 142 136  --  135  K 4.4 5.4*  5.3* 5.4* 4.1  CL 106 102   --  98  CO2 27 30  --  27  GLUCOSE 95 99  --  93  BUN 22 26*  --  31*  CREATININE 1.87* 1.82*  --  1.83*  CALCIUM 8.5 9.0  --  8.9  MG 1.9  --   --   --    No results for input(s): AST, ALT, ALKPHOS, BILITOT, PROT, ALBUMIN in the last 72 hours.  Recent Labs  07/17/14 1141 07/18/14 0435  WBC 7.9 8.5  HGB 10.5* 10.3*  HCT 35.2* 33.9*  MCV 90.0 87.6  PLT 292 279    Recent Labs  07/15/14 1532  TROPONINI <0.03   Invalid input(s): POCBNP No results for input(s): HGBA1C in the last 72 hours.   Radiology/Studies:  Dg Chest Portable 1 View  07/14/2014   CLINICAL DATA:  Acute onset of shortness of breath and chest pain. Recent myocardial infarction. Initial encounter.  EXAM: PORTABLE CHEST - 1 VIEW  COMPARISON:  Chest radiograph performed 05/16/2014  FINDINGS: The lungs are well-aerated. Bibasilar opacities likely reflect atelectasis. There is no evidence of pleural effusion or pneumothorax.  The cardiomediastinal silhouette is mildly enlarged. The patient is status post median sternotomy. No acute osseous abnormalities are seen. A skin fold is noted overlying the left hemithorax.  IMPRESSION: Bibasilar opacities likely reflect atelectasis; lungs otherwise clear. Mild cardiomegaly noted.   Electronically Signed   By: Francoise Schaumann.D.  On: 07/14/2014 21:12     Assessment and Plan  69 y/o F w/ PMHx of CKD stage 3, ASD s/p repair, mild MR, PAF (Eliquis previously stopped due to GIB) , chronic combined CHF (EF of 99-83%, grade 3 diastolic dysfunction)/ICM, and CAD (cath 04/2013 showed RCA, LCx occlusion, 50% mid-LAD stenosis, 80% distal stenosis, and first diagonal branch w/ 75% ostial stenosis) admitted for severe chest pain.  1. Chest pain/unstable angina  - initially a code STEMI d/t inferolateral ST depressions on EKG (present from before), but w/ negative troponin on admission. Data reviewed by interventional cardiology, felt that given her multiple co-morbidities, she was not a  good candidate for emergent cath - patient has previously been evaluated for CABG, however d/t renal insufficiency and mild MR, the decision was previously made to manage her medically - now off heparin with negative enzymes - continue aspirin, statin, BB, Imdur, Norvasc - with tendency towards softer BP, will discuss plan for home regimen with MD - if chest pain recurs consider repeat cardiac catheterization +/- addition of Ranexa  2. Anemia - Hgb 12->9.5 - now stable in the 10 range - will ask her to f/u PCP closely  3. Chronic combined CHF, euvolemic - echo 03/2014 showed EF of 30-35% - f/u echo 07/15/14 with EF 30-35%, +WMA, grade 2 DD, mild-mod MR - continue daily Lasix  4. Paroxysmal atrial fibrillation, maintaining NSR - previously on Eliquis but this was stopped d/t GIB/symptomatic anemia Hgb 6 in 03/2014 (colonoscopy - small bowel AVM/erosions) - continue amiodarone, Coreg  5. S/p rat bite - received 5 injections total - 2/11, 2/16, 2/19, 2/23, and 3/1 here - reviewed rabies vaccine dosing regimen with Dr. Baxter Flattery with ID who feels the patient has completed adequate course of vaccines - abx stopped yesterday due to course completed and elevated potassium level - I placed a call to her niece clarifying that she has in fact completed course  6. CKD stage III, creatinine stable 7. COPD on home oxygen  Signed, Dayna Dunn PA-C As above, patient seen and examined. She is not having chest pain. Plan discharge today on present medications and follow-up in [place]. Greater than 30 minutes PA and physician time. [place]

## 2014-07-19 NOTE — Care Management Note (Addendum)
Late Entry  07/19/2014        CARE MANAGEMENT NOTE 07/19/2014  Patient:  DAESHA, [employer]   Account Number:  [id number]  Date Initiated:  07/15/2014  Documentation initiated by:  Elissa Hefty  Subjective/Objective Assessment:   adm w angina     Action/Plan:   lives w sister, pcp dr ed Luan Pulling  07/18/14 Spoke with pt sister and sister in law who both agree that pt has used [facility] previously and wishes to use that agency again for HHPT. No other needs.   Anticipated DC Date:  07/18/2014   Anticipated DC Plan:  [place]         Choice offered to / List presented to:  C-5 Sibling        HH arranged  [place].   Status of service:  Completed, signed off Medicare Important Message given?  YES (If response is "NO", the following Medicare IM given date fields will be blank) Date Medicare IM given:  07/17/2014 Medicare IM given by:  Master Touchet Date Additional Medicare IM given:   Additional Medicare IM given by:    Discharge Disposition:  [place]  Per UR Regulation:  Reviewed for med. necessity/level of care/duration of stay  If discussed at [place] of Stay Meetings, dates discussed:    Comments:

## 2014-07-22 DIAGNOSIS — I482 Chronic atrial fibrillation: Secondary | ICD-10-CM | POA: Diagnosis not present

## 2014-07-22 DIAGNOSIS — I5022 Chronic systolic (congestive) heart failure: Secondary | ICD-10-CM | POA: Diagnosis not present

## 2014-07-22 DIAGNOSIS — D649 Anemia, unspecified: Secondary | ICD-10-CM | POA: Diagnosis not present

## 2014-07-22 DIAGNOSIS — Z87891 Personal history of nicotine dependence: Secondary | ICD-10-CM | POA: Diagnosis not present

## 2014-07-22 DIAGNOSIS — J449 Chronic obstructive pulmonary disease, unspecified: Secondary | ICD-10-CM | POA: Diagnosis not present

## 2014-07-22 DIAGNOSIS — I255 Ischemic cardiomyopathy: Secondary | ICD-10-CM | POA: Diagnosis not present

## 2014-07-22 DIAGNOSIS — I10 Essential (primary) hypertension: Secondary | ICD-10-CM | POA: Diagnosis not present

## 2014-07-22 DIAGNOSIS — I2510 Atherosclerotic heart disease of native coronary artery without angina pectoris: Secondary | ICD-10-CM | POA: Diagnosis not present

## 2014-07-22 DIAGNOSIS — I252 Old myocardial infarction: Secondary | ICD-10-CM | POA: Diagnosis not present

## 2014-07-22 DIAGNOSIS — F70 Mild intellectual disabilities: Secondary | ICD-10-CM | POA: Diagnosis not present

## 2014-07-23 DIAGNOSIS — I255 Ischemic cardiomyopathy: Secondary | ICD-10-CM | POA: Diagnosis not present

## 2014-07-23 DIAGNOSIS — I5022 Chronic systolic (congestive) heart failure: Secondary | ICD-10-CM | POA: Diagnosis not present

## 2014-07-23 DIAGNOSIS — D649 Anemia, unspecified: Secondary | ICD-10-CM | POA: Diagnosis not present

## 2014-07-23 DIAGNOSIS — I252 Old myocardial infarction: Secondary | ICD-10-CM | POA: Diagnosis not present

## 2014-07-23 DIAGNOSIS — I10 Essential (primary) hypertension: Secondary | ICD-10-CM | POA: Diagnosis not present

## 2014-07-23 DIAGNOSIS — F70 Mild intellectual disabilities: Secondary | ICD-10-CM | POA: Diagnosis not present

## 2014-07-23 DIAGNOSIS — Z87891 Personal history of nicotine dependence: Secondary | ICD-10-CM | POA: Diagnosis not present

## 2014-07-23 DIAGNOSIS — J449 Chronic obstructive pulmonary disease, unspecified: Secondary | ICD-10-CM | POA: Diagnosis not present

## 2014-07-23 DIAGNOSIS — I2510 Atherosclerotic heart disease of native coronary artery without angina pectoris: Secondary | ICD-10-CM | POA: Diagnosis not present

## 2014-07-23 DIAGNOSIS — I482 Chronic atrial fibrillation: Secondary | ICD-10-CM | POA: Diagnosis not present

## 2014-07-25 DIAGNOSIS — I255 Ischemic cardiomyopathy: Secondary | ICD-10-CM | POA: Diagnosis not present

## 2014-07-25 DIAGNOSIS — I5022 Chronic systolic (congestive) heart failure: Secondary | ICD-10-CM | POA: Diagnosis not present

## 2014-07-25 DIAGNOSIS — J449 Chronic obstructive pulmonary disease, unspecified: Secondary | ICD-10-CM | POA: Diagnosis not present

## 2014-07-25 DIAGNOSIS — I10 Essential (primary) hypertension: Secondary | ICD-10-CM | POA: Diagnosis not present

## 2014-07-25 DIAGNOSIS — F70 Mild intellectual disabilities: Secondary | ICD-10-CM | POA: Diagnosis not present

## 2014-07-25 DIAGNOSIS — D649 Anemia, unspecified: Secondary | ICD-10-CM | POA: Diagnosis not present

## 2014-07-25 DIAGNOSIS — I129 Hypertensive chronic kidney disease with stage 1 through stage 4 chronic kidney disease, or unspecified chronic kidney disease: Secondary | ICD-10-CM | POA: Diagnosis not present

## 2014-07-25 DIAGNOSIS — I252 Old myocardial infarction: Secondary | ICD-10-CM | POA: Diagnosis not present

## 2014-07-25 DIAGNOSIS — I2510 Atherosclerotic heart disease of native coronary artery without angina pectoris: Secondary | ICD-10-CM | POA: Diagnosis not present

## 2014-07-25 DIAGNOSIS — I482 Chronic atrial fibrillation: Secondary | ICD-10-CM | POA: Diagnosis not present

## 2014-07-25 DIAGNOSIS — Z87891 Personal history of nicotine dependence: Secondary | ICD-10-CM | POA: Diagnosis not present

## 2014-07-29 DIAGNOSIS — J449 Chronic obstructive pulmonary disease, unspecified: Secondary | ICD-10-CM | POA: Diagnosis not present

## 2014-07-30 DIAGNOSIS — I255 Ischemic cardiomyopathy: Secondary | ICD-10-CM | POA: Diagnosis not present

## 2014-07-30 DIAGNOSIS — J449 Chronic obstructive pulmonary disease, unspecified: Secondary | ICD-10-CM | POA: Diagnosis not present

## 2014-07-30 DIAGNOSIS — Z87891 Personal history of nicotine dependence: Secondary | ICD-10-CM | POA: Diagnosis not present

## 2014-07-30 DIAGNOSIS — D649 Anemia, unspecified: Secondary | ICD-10-CM | POA: Diagnosis not present

## 2014-07-30 DIAGNOSIS — I252 Old myocardial infarction: Secondary | ICD-10-CM | POA: Diagnosis not present

## 2014-07-30 DIAGNOSIS — I5022 Chronic systolic (congestive) heart failure: Secondary | ICD-10-CM | POA: Diagnosis not present

## 2014-07-30 DIAGNOSIS — F70 Mild intellectual disabilities: Secondary | ICD-10-CM | POA: Diagnosis not present

## 2014-07-30 DIAGNOSIS — I482 Chronic atrial fibrillation: Secondary | ICD-10-CM | POA: Diagnosis not present

## 2014-07-30 DIAGNOSIS — I2510 Atherosclerotic heart disease of native coronary artery without angina pectoris: Secondary | ICD-10-CM | POA: Diagnosis not present

## 2014-07-30 DIAGNOSIS — I10 Essential (primary) hypertension: Secondary | ICD-10-CM | POA: Diagnosis not present

## 2014-07-31 ENCOUNTER — Encounter: Payer: Self-pay | Admitting: Physician Assistant

## 2014-07-31 ENCOUNTER — Ambulatory Visit (INDEPENDENT_AMBULATORY_CARE_PROVIDER_SITE_OTHER): Payer: Medicare Other | Admitting: Physician Assistant

## 2014-07-31 VITALS — BP 86/52 | HR 62 | Wt 94.0 lb

## 2014-07-31 DIAGNOSIS — I5042 Chronic combined systolic (congestive) and diastolic (congestive) heart failure: Secondary | ICD-10-CM | POA: Diagnosis not present

## 2014-07-31 DIAGNOSIS — N289 Disorder of kidney and ureter, unspecified: Secondary | ICD-10-CM

## 2014-07-31 DIAGNOSIS — Z136 Encounter for screening for cardiovascular disorders: Secondary | ICD-10-CM

## 2014-07-31 DIAGNOSIS — I2510 Atherosclerotic heart disease of native coronary artery without angina pectoris: Secondary | ICD-10-CM

## 2014-07-31 DIAGNOSIS — I480 Paroxysmal atrial fibrillation: Secondary | ICD-10-CM | POA: Diagnosis not present

## 2014-07-31 DIAGNOSIS — I10 Essential (primary) hypertension: Secondary | ICD-10-CM | POA: Diagnosis not present

## 2014-07-31 DIAGNOSIS — R0989 Other specified symptoms and signs involving the circulatory and respiratory systems: Secondary | ICD-10-CM

## 2014-07-31 NOTE — Patient Instructions (Signed)
Your physician recommends that you schedule a follow-up appointment in: 1 month with [facility]  Your physician recommends that you return for lab work in: Today Paramedic)  Your physician has requested that you have a carotid duplex. This test is an ultrasound of the carotid arteries in your neck. It looks at blood flow through these arteries that supply the brain with blood. Allow one hour for this exam. There are no restrictions or special instructions.  You have been given a copy of a low salt diet.     Smoking Cessation Quitting smoking is important to your health and has many advantages. However, it is not always easy to quit since nicotine is a very addictive drug. Oftentimes, people try 3 times or more before being able to quit. This document explains the best ways for you to prepare to quit smoking. Quitting takes hard work and a lot of effort, but you can do it. ADVANTAGES OF QUITTING SMOKING  You will live longer, feel better, and live better.  Your body will feel the impact of quitting smoking almost immediately.  Within 20 minutes, blood pressure decreases. Your pulse returns to its normal level.  After 8 hours, carbon monoxide levels in the blood return to normal. Your oxygen level increases.  After 24 hours, the chance of having a heart attack starts to decrease. Your breath, hair, and body stop smelling like smoke.  After 48 hours, damaged nerve endings begin to recover. Your sense of taste and smell improve.  After 72 hours, the body is virtually free of nicotine. Your bronchial tubes relax and breathing becomes easier.  After 2 to 12 weeks, lungs can hold more air. Exercise becomes easier and circulation improves.  The risk of having a heart attack, stroke, cancer, or lung disease is greatly reduced.  After 1 year, the risk of coronary heart disease is cut in half.  After 5 years, the risk of stroke falls to the same as a nonsmoker.  After 10 years, the risk of lung  cancer is cut in half and the risk of other cancers decreases significantly.  After 15 years, the risk of coronary heart disease drops, usually to the level of a nonsmoker.  If you are pregnant, quitting smoking will improve your chances of having a healthy baby.  The people you live with, especially any children, will be healthier.  You will have extra money to spend on things other than cigarettes. QUESTIONS TO THINK ABOUT BEFORE ATTEMPTING TO QUIT You may want to talk about your answers with your health care provider.  Why do you want to quit?  If you tried to quit in the past, what helped and what did not?  What will be the most difficult situations for you after you quit? How will you plan to handle them?  Who can help you through the tough times? Your family? Friends? A health care provider?  What pleasures do you get from smoking? What ways can you still get pleasure if you quit? Here are some questions to ask your health care provider:  How can you help me to be successful at quitting?  What medicine do you think would be best for me and how should I take it?  What should I do if I need more help?  What is smoking withdrawal like? How can I get information on withdrawal? GET READY  Set a quit date.  Change your environment by getting rid of all cigarettes, ashtrays, matches, and lighters in  your home, car, or work. Do not let people smoke in your home.  Review your past attempts to quit. Think about what worked and what did not. GET SUPPORT AND ENCOURAGEMENT You have a better chance of being successful if you have help. You can get support in many ways.  Tell your family, friends, and coworkers that you are going to quit and need their support. Ask them not to smoke around you.  Get individual, group, or telephone counseling and support. Programs are available at [employer] and health centers. Call your local health department for information about programs in  your area.  Spiritual beliefs and practices may help some smokers quit.  Download a "quit meter" on your computer to keep track of quit statistics, such as how long you have gone without smoking, cigarettes not smoked, and money saved.  Get a self-help book about quitting smoking and staying off tobacco. [place] yourself from urges to smoke. Talk to someone, go for a walk, or occupy your time with a task.  Change your normal routine. Take a different route to work. Drink tea instead of coffee. Eat breakfast in a different place.  Reduce your stress. Take a hot bath, exercise, or read a book.  Plan something enjoyable to do every day. Reward yourself for not smoking.  Explore interactive web-based programs that specialize in helping you quit. GET MEDICINE AND USE IT CORRECTLY Medicines can help you stop smoking and decrease the urge to smoke. Combining medicine with the above behavioral methods and support can greatly increase your chances of successfully quitting smoking.  Nicotine replacement therapy helps deliver nicotine to your body without the negative effects and risks of smoking. Nicotine replacement therapy includes nicotine gum, lozenges, inhalers, nasal sprays, and skin patches. Some may be available over-the-counter and others require a prescription.  Antidepressant medicine helps people abstain from smoking, but how this works is unknown. This medicine is available by prescription.  Nicotinic receptor partial agonist medicine simulates the effect of nicotine in your brain. This medicine is available by prescription. Ask your health care provider for advice about which medicines to use and how to use them based on your health history. Your health care provider will tell you what side effects to look out for if you choose to be on a medicine or therapy. Carefully read the information on the package. Do not use any other product containing nicotine  while using a nicotine replacement product.  RELAPSE OR DIFFICULT SITUATIONS Most relapses occur within the first 3 months after quitting. Do not be discouraged if you start smoking again. Remember, most people try several times before finally quitting. You may have symptoms of withdrawal because your body is used to nicotine. You may crave cigarettes, be irritable, feel very hungry, cough often, get headaches, or have difficulty concentrating. The withdrawal symptoms are only temporary. They are strongest when you first quit, but they will go away within 10-14 days. To reduce the chances of relapse, try to:  Avoid drinking alcohol. Drinking lowers your chances of successfully quitting.  Reduce the amount of caffeine you consume. Once you quit smoking, the amount of caffeine in your body increases and can give you symptoms, such as a rapid heartbeat, sweating, and anxiety.  Avoid smokers because they can make you want to smoke.  Do not let weight gain distract you. Many smokers will gain weight when they quit, usually less than 10 pounds. Eat a healthy diet and  stay active. You can always lose the weight gained after you quit.  Find ways to improve your mood other than smoking. FOR MORE INFORMATION  www.smokefree.gov  Document Released: 04/27/2001 Document Revised: 09/17/2013 Document Reviewed: 08/12/2011 [facility] Patient Information 2015 [place], [place]. This information is not intended to replace advice given to you by your health care provider. Make sure you discuss any questions you have with your health care provider.

## 2014-07-31 NOTE — Assessment & Plan Note (Signed)
Blood pressure is on the low side.

## 2014-07-31 NOTE — Assessment & Plan Note (Signed)
There is no evidence of heart failure on exam today. Patient eats an enormous amount of salt. Had a long discussion with her about modifying her salt intake by cutting back on sodas, canned foods and packaged foods.

## 2014-07-31 NOTE — Assessment & Plan Note (Signed)
Check carotid Dopplers °

## 2014-07-31 NOTE — Assessment & Plan Note (Signed)
Followup renal function today. 

## 2014-07-31 NOTE — Assessment & Plan Note (Signed)
Patient with known coronary artery disease with recent admission for severe chest pain treated medically. Patient did have one episode of chest pain at home while arguing with her daughter. Disease with 2 sublingual nitroglycerin. She has no exertional chest pain. Her blood pressure is quite low so there is no room to move with her medications. Continue current medications. She is to contact us if she has any further chest pain. Possible cardiac cath versus adding Ranexa could be considered. Because of her comorbidities including renal insufficiency cath was not recommended. Follow-up with Dr.Nishan in one month.

## 2014-07-31 NOTE — Progress Notes (Signed)
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 She was admitted 07/14/2014 for severe chest pain.   She was initially called a code STEMI d/t inferolateral ST depressions on EKG (present from before), but w/ negative troponin on admission. Data reviewed by interventional cardiology, felt that given her multiple co-morbidities, she was not a good candidate for emergent cath. Note she has previously been evaluated for CABG, however d/t renal insufficiency and mild MR, the decision was previously made to manage her medically. She ruled out for MI. Her anti-anginal regimen was adjusted to include addition of Norvasc, Coreg, and Imdur. Her blood pressure may prohibit further titration. If chest pain recurs consider repeat cardiac catheterization +/- addition of Ranexa. From a CHF standpoint she was euvolemic during her stay. 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 She maintained NSR during her stay. By day of discharge she was ambulating with PT without chest pain. HHPT was ordered to increase endurance. Cr remained generally in the 1.8 range, baseline said to be 1.5-2.0. Other problems in the hospital and occluded anemia stable at 10, she had a rapid bite and received 5 rabies injections and antibiotics., Hyperkalemia.  Patient comes in today accompanied by her granddaughter. She had an episode of chest pain relieved with 2  sublingual nitroglycerin when arguing with her daughter. She says she argues with her daughter a lot and she gets upset. She did not have her oxygen on at the time. She takes it off often to smoke. She is smoking about half a pack of cigarettes daily. She is also eating a lot of canned foods, Ramen noodles, and drinks 5 sodas a day. She has no edema or worsening dyspnea. She also forgets some of her evening meds according to her granddaughter. Her granddaughter states that she will be compliant but she is taking her medications.     Past Medical History  Diagnosis Date  . COPD (chronic obstructive pulmonary disease)   . Tobacco abuse   . Hypertension   . History of atrial septal defect     Closure in 50's   . Mental retardation, idiopathic mild   . Arthritis   . NSTEMI (non-ST elevated myocardial infarction)   . PAF (paroxysmal atrial fibrillation) 04/2013    a. s/p prior TEE/DCCV. b. Eliquis stopped after GIB/anemia in 03/2014.  Marland Kitchen GERD (gastroesophageal reflux disease)   . Atrophy of right kidney   . Chronic systolic CHF (congestive heart failure)   . Diverticulosis   . Hiatal hernia   . Barrett esophagus   . Ischemic cardiomyopathy     a. 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  Marland Kitchen CAD (coronary artery disease) 04/2013    a. cath 04/2013 showed RCA, LCx occlusion, 50% mid-LAD stenosis, 80% distal stenosis, and first diagonal branch w/ 75% ostial stenosis. previously been evaluated for CABG, however d/t renal insufficiency and mild MR,  the decision was previously made to manage her medically.  . Anemia   . GI bleed     a.  GIB/symptomatic anemia Hgb 6 in 03/2014 (colonoscopy - small bowel AVMs/erosions).  . Rat bite     a. Tx with rabies vaccines.  . Hyperkalemia   . CKD (chronic kidney disease), stage III   . Chronic respiratory failure     Past Surgical History  Procedure Laterality Date  . Asd repair      in her 76s  . Total hip arthroplasty Right      in her 40s  . Total abdominal hysterectomy      in the 31s  . Tee without cardioversion N/A 05/08/2013    Procedure: TRANSESOPHAGEAL ECHOCARDIOGRAM (TEE);  Surgeon: Lelon Perla, MD;  Location[facility] ENDOSCOPY;  Service: Cardiovascular;  Laterality: N/A;  . Cardioversion N/A 05/08/2013    Procedure: CARDIOVERSION;  Surgeon: Lelon Perla, MD;  Location: [facility] ENDOSCOPY;  Service: Cardiovascular;  Laterality: N/A;  Dr. Zoila Shutter verified with EP pt in a flutter...will cardiovert...  150 joules @ 4:40, using Propofol 40  mg/IV .Marland Kitchen.successful NSR   . Tonsillectomy    . Colonoscopy N/A 10/24/2013    Dr. Gala Romney: 3 polyps, one removed piecemeal (tubulovillous adenoma). Next TCS 04/2014  . Esophagogastroduodenoscopy N/A 10/24/2013    Dr. Rourk:Barrett's without dysplasia. Next EGD 10/2014  . Cardiac catheterization  04/2013    Left mainstem: LM calcified with long 30% stenosis. Left anterior descending (LAD):  The LAD is large and wraps the apex.  The mid vessel has 50% stenosis followed by a focal 80% stenosis.  D1 small with ostial 75% stenosis.Left anterior descending (LAD):  The LAD is large and wraps the apex.  The mid vessel has 50% stenosis followed by a focal 80% stenosis.  D1 small with ostial 75% stenosis.     . Agile capsule N/A 04/05/2014    Procedure: AGILE CAPSULE;  Surgeon: Danie Binder, MD;  Location: [facility] ENDO SUITE;  Service: Endoscopy;  Laterality: N/A;  . Colonoscopy N/A 04/10/2014    Procedure: COLONOSCOPY;  Surgeon: Daneil Dolin, MD;  Location: [facility] ENDO SUITE;  Service: Endoscopy;  Laterality: N/A;  . Givens capsule study N/A 04/17/2014    Procedure: GIVENS CAPSULE STUDY;  Surgeon: Daneil Dolin, MD;  Location: [facility] ENDO SUITE;  Service: Endoscopy;  Laterality: N/A;  . Left and right heart catheterization with coronary angiogram N/A 05/04/2013    Procedure: LEFT AND RIGHT HEART CATHETERIZATION WITH CORONARY ANGIOGRAM;  Surgeon: Minus Breeding, MD;  Location: [facility] CATH LAB;  Service:  Cardiovascular;  Laterality: N/A;     Current Outpatient Prescriptions  Medication Sig Dispense Refill  . acetaminophen (TYLENOL) 325 MG tablet Take 2 tablets (650 mg total) by mouth every 6 (six) hours as needed for mild pain or fever.    Marland Kitchen albuterol (PROVENTIL HFA;VENTOLIN HFA) 108 (90 BASE) MCG/ACT inhaler Inhale 2 puffs into the lungs every 6 (six) hours as needed for wheezing or shortness of breath.    Marland Kitchen albuterol (PROVENTIL) (2.5 MG/3ML) 0.083% nebulizer solution Take 3 mLs (2.5 mg total) by nebulization every 2 (two) hours as needed for wheezing or shortness of breath. 75 mL 12  . ALPRAZolam (XANAX) 0.25 MG tablet Take 0.25 mg by mouth 3 (three) times daily as needed for anxiety.     Marland Kitchen amiodarone (PACERONE) 200 MG tablet Take 1 tablet (200 mg total) by mouth daily. 60 tablet 1  . amLODipine (NORVASC) 2.5 MG tablet  Take 1 tablet (2.5 mg total) by mouth daily. 30 tablet 6  . aspirin EC 81 MG tablet Take 1 tablet (81 mg total) by mouth daily. 30 tablet 6  . atorvastatin (LIPITOR) 80 MG tablet Take 80 mg by mouth daily.    . carvedilol (COREG) 6.25 MG tablet Take 1 tablet (6.25 mg total) by mouth 2 (two) times daily with a meal. 60 tablet 6  . cholecalciferol (VITAMIN D) 1000 UNITS tablet Take 2,000 Units by mouth daily.    . furosemide (LASIX) 20 MG tablet Take 20 mg by mouth daily.    Marland Kitchen HYDROcodone-acetaminophen (NORCO/VICODIN) 5-325 MG per tablet Take 1 tablet by mouth every 4 (four) hours as needed for moderate pain.     Marland Kitchen ipratropium-albuterol (DUONEB) 0.5-2.5 (3) MG/3ML SOLN Take 3 mLs by nebulization every 6 (six) hours as needed (SHORTNESS OF BREATH).    Marland Kitchen isosorbide mononitrate (IMDUR) 30 MG 24 hr tablet Take 1 tablet (30 mg total) by mouth daily. 30 tablet 6  . nitroGLYCERIN (NITROSTAT) 0.4 MG SL tablet Place 1 tablet (0.4 mg total) under the tongue every 5 (five) minutes as needed for chest pain (up to 3 doses). 25 tablet 3  . pantoprazole (PROTONIX) 40 MG tablet Take 40 mg by  mouth daily.    . sodium chloride (OCEAN) 0.65 % SOLN nasal spray Place 1 spray into both nostrils as needed for congestion.  0   No current facility-administered medications for this visit.    Allergies:   Review of patient's allergies indicates no known allergies.    Social History:  The patient  reports that she has quit smoking. Her smoking use included Cigarettes. She started smoking about 56 years ago. She has a 41.25 pack-year smoking history. She has never used smokeless tobacco. She reports that she does not drink alcohol or use illicit drugs.   Family History:  The patient's family history includes Cancer in her brother and brother; Colon cancer in her brother; Diabetes in her mother; Heart attack in her brother, brother, father, sister, and sister.    ROS:  Please see the history of present illness.   Otherwise, review of systems are positive for none.   All other systems are reviewed and negative.    PHYSICAL EXAM: BP 86/52 mmHg  Pulse 62  Wt 94 lb (42.638 kg)  SpO2 91% GEN: Elderly, thin, looks older than her stated age, in no acute distress Neck: Bilateral carotid bruits, no JVD, HJR,  or masses Cardiac: RRR; positive S4, 2/6 systolic murmur at the apex, no rubs, thrill or heave,no edema,   Respiratory:  Decreased breath sounds throughout without wheezing rales or rhonchi. GI: soft, nontender, nondistended, + BS MS: no deformity or atrophy Extremities: without cyanosis, clubbing, edema, good distal pulses bilaterally.  Skin: warm and dry, no rash Neuro:  Strength and sensation are intact Psych: euthymic mood, full affect   EKG:  EKG is ordered today. The ekg ordered today demonstrates normal sinus rhythm with incomplete right bundle branch block, no acute change   Recent Labs: 04/04/2014: Pro B Natriuretic peptide (BNP) 7846.0*; TSH 3.990 04/06/2014: ALT 9 07/14/2014: B Natriuretic Peptide 510.9* 07/16/2014: Magnesium 1.9 07/18/2014: BUN 31*; Creatinine 1.83*;  Hemoglobin 10.3*; Platelets 279; Potassium 4.1; Sodium 135    Lipid Panel    Component Value Date/Time   CHOL 107 07/15/2014 0410   TRIG 112 07/15/2014 0410   HDL 44 07/15/2014 0410   CHOLHDL 2.4 07/15/2014 0410   VLDL 22 07/15/2014 0410  LDLCALC 41 07/15/2014 0410      Wt Readings from Last 3 Encounters:  07/31/14 94 lb (42.638 kg)  07/18/14 93 lb 11.2 oz (42.502 kg)  06/27/14 94 lb 9.6 oz (42.91 kg)      Other studies Reviewed: Additional studies/ records that were reviewed today include and review of the records demonstrates:  2-D echo 07/15/14 Study Conclusions  - HPI and indications: Cardiomyopathy. - Left ventricle: The cavity size was normal. Wall thickness was   normal. Systolic function was moderately to severely reduced. The   estimated ejection fraction was in the range of 30% to 35%.   Inferolateral akinesis, basal to mid anterolateral akinesis,   basal inferior aneurysm, mid inferior akinesis. Features are   consistent with a pseudonormal left ventricular filling pattern,   with concomitant abnormal relaxation and increased filling   pressure (grade 2 diastolic dysfunction). - Aortic valve: There was no stenosis. There was trivial   regurgitation. - Mitral valve: There was mild to moderate regurgitation. - Left atrium: The atrium was mildly dilated. - Right ventricle: The cavity size was mildly dilated. Systolic   function was mildly reduced. - Tricuspid valve: Peak RV-RA gradient (S): 30 mm Hg. - Pulmonary arteries: PA peak pressure: 33 mm Hg (S). - Inferior vena cava: The vessel was normal in size. The   respirophasic diameter changes were in the normal range (>= 50%),   consistent with normal central venous pressure.  Impressions:  - Normal LV size with EF 30-35%. Wall motion abnormalities as noted   above. Moderate diastolic dysfunction. Mild to moderate MR.   Mildly dilated RV with mildly decreased systolic function.    ASSESSMENT AND  PLAN:  CAD (coronary artery disease) Patient with known coronary artery disease with recent admission for severe chest pain treated medically. Patient did have one episode of chest pain at home while arguing with her daughter. Disease with 2 sublingual nitroglycerin. She has no exertional chest pain. Her blood pressure is quite low so there is no room to move with her medications. Continue current medications. She is to contact us if she has any further chest pain. Possible cardiac cath versus adding Ranexa could be considered. Because of her comorbidities including renal insufficiency cath was not recommended. Follow-up with Dr.Nishan in one month.   Chronic combined systolic and diastolic CHF (congestive heart failure) There is no evidence of heart failure on exam today. Patient eats an enormous amount of salt. Had a long discussion with her about modifying her salt intake by cutting back on sodas, canned foods and packaged foods.   Hypertension Blood pressure is on the low side.   PAF (paroxysmal atrial fibrillation) In normal sinus rhythm on amiodarone   CKD (chronic kidney disease) stage 4, GFR 15-29 ml/min Follow-up renal function today.   Bilateral carotid bruits Check carotid Dopplers.     Sumner Boast, PA-C  07/31/2014 12:39 PM    [facility] [address], [place], [place]  [address] Phone: [phone]; Fax: [phone]

## 2014-07-31 NOTE — Assessment & Plan Note (Signed)
In normal sinus rhythm on amiodarone

## 2014-08-01 ENCOUNTER — Encounter: Payer: Self-pay | Admitting: Internal Medicine

## 2014-08-01 DIAGNOSIS — I5022 Chronic systolic (congestive) heart failure: Secondary | ICD-10-CM | POA: Diagnosis not present

## 2014-08-01 DIAGNOSIS — Z87891 Personal history of nicotine dependence: Secondary | ICD-10-CM | POA: Diagnosis not present

## 2014-08-01 DIAGNOSIS — D649 Anemia, unspecified: Secondary | ICD-10-CM | POA: Diagnosis not present

## 2014-08-01 DIAGNOSIS — I482 Chronic atrial fibrillation: Secondary | ICD-10-CM | POA: Diagnosis not present

## 2014-08-01 DIAGNOSIS — I10 Essential (primary) hypertension: Secondary | ICD-10-CM | POA: Diagnosis not present

## 2014-08-01 DIAGNOSIS — I252 Old myocardial infarction: Secondary | ICD-10-CM | POA: Diagnosis not present

## 2014-08-01 DIAGNOSIS — I255 Ischemic cardiomyopathy: Secondary | ICD-10-CM | POA: Diagnosis not present

## 2014-08-01 DIAGNOSIS — I2510 Atherosclerotic heart disease of native coronary artery without angina pectoris: Secondary | ICD-10-CM | POA: Diagnosis not present

## 2014-08-01 DIAGNOSIS — F70 Mild intellectual disabilities: Secondary | ICD-10-CM | POA: Diagnosis not present

## 2014-08-01 DIAGNOSIS — J449 Chronic obstructive pulmonary disease, unspecified: Secondary | ICD-10-CM | POA: Diagnosis not present

## 2014-08-01 LAB — BASIC METABOLIC PANEL
BUN: 18 mg/dL (ref 6–23)
CO2: 25 mEq/L (ref 19–32)
Calcium: 9.3 mg/dL (ref 8.4–10.5)
Chloride: 106 mEq/L (ref 96–112)
Creat: 1.47 mg/dL — ABNORMAL HIGH (ref 0.50–1.10)
Glucose, Bld: 87 mg/dL (ref 70–99)
Potassium: 3.8 mEq/L (ref 3.5–5.3)
Sodium: 140 mEq/L (ref 135–145)

## 2014-08-03 DIAGNOSIS — I25119 Atherosclerotic heart disease of native coronary artery with unspecified angina pectoris: Secondary | ICD-10-CM | POA: Diagnosis not present

## 2014-08-03 DIAGNOSIS — I482 Chronic atrial fibrillation: Secondary | ICD-10-CM | POA: Diagnosis not present

## 2014-08-03 DIAGNOSIS — I2510 Atherosclerotic heart disease of native coronary artery without angina pectoris: Secondary | ICD-10-CM | POA: Diagnosis not present

## 2014-08-03 DIAGNOSIS — I5022 Chronic systolic (congestive) heart failure: Secondary | ICD-10-CM | POA: Diagnosis not present

## 2014-08-03 DIAGNOSIS — I252 Old myocardial infarction: Secondary | ICD-10-CM | POA: Diagnosis not present

## 2014-08-03 DIAGNOSIS — I509 Heart failure, unspecified: Secondary | ICD-10-CM | POA: Diagnosis not present

## 2014-08-03 DIAGNOSIS — M1990 Unspecified osteoarthritis, unspecified site: Secondary | ICD-10-CM | POA: Diagnosis not present

## 2014-08-03 DIAGNOSIS — D649 Anemia, unspecified: Secondary | ICD-10-CM | POA: Diagnosis not present

## 2014-08-03 DIAGNOSIS — J449 Chronic obstructive pulmonary disease, unspecified: Secondary | ICD-10-CM | POA: Diagnosis not present

## 2014-08-03 DIAGNOSIS — I255 Ischemic cardiomyopathy: Secondary | ICD-10-CM | POA: Diagnosis not present

## 2014-08-03 DIAGNOSIS — Z87891 Personal history of nicotine dependence: Secondary | ICD-10-CM | POA: Diagnosis not present

## 2014-08-03 DIAGNOSIS — I10 Essential (primary) hypertension: Secondary | ICD-10-CM | POA: Diagnosis not present

## 2014-08-03 DIAGNOSIS — F70 Mild intellectual disabilities: Secondary | ICD-10-CM | POA: Diagnosis not present

## 2014-08-05 DIAGNOSIS — F70 Mild intellectual disabilities: Secondary | ICD-10-CM | POA: Diagnosis not present

## 2014-08-05 DIAGNOSIS — I5022 Chronic systolic (congestive) heart failure: Secondary | ICD-10-CM | POA: Diagnosis not present

## 2014-08-05 DIAGNOSIS — I10 Essential (primary) hypertension: Secondary | ICD-10-CM | POA: Diagnosis not present

## 2014-08-05 DIAGNOSIS — J449 Chronic obstructive pulmonary disease, unspecified: Secondary | ICD-10-CM | POA: Diagnosis not present

## 2014-08-05 DIAGNOSIS — I252 Old myocardial infarction: Secondary | ICD-10-CM | POA: Diagnosis not present

## 2014-08-05 DIAGNOSIS — Z87891 Personal history of nicotine dependence: Secondary | ICD-10-CM | POA: Diagnosis not present

## 2014-08-05 DIAGNOSIS — D649 Anemia, unspecified: Secondary | ICD-10-CM | POA: Diagnosis not present

## 2014-08-05 DIAGNOSIS — I255 Ischemic cardiomyopathy: Secondary | ICD-10-CM | POA: Diagnosis not present

## 2014-08-05 DIAGNOSIS — I2510 Atherosclerotic heart disease of native coronary artery without angina pectoris: Secondary | ICD-10-CM | POA: Diagnosis not present

## 2014-08-05 DIAGNOSIS — I482 Chronic atrial fibrillation: Secondary | ICD-10-CM | POA: Diagnosis not present

## 2014-08-06 ENCOUNTER — Ambulatory Visit (HOSPITAL_COMMUNITY)
Admission: RE | Admit: 2014-08-06 | Discharge: 2014-08-06 | Disposition: A | Discharge status: Home / Self Care | Payer: Medicare Other | Source: Ambulatory Visit | Attending: Physician Assistant | Admitting: Physician Assistant

## 2014-08-06 DIAGNOSIS — Z720 Tobacco use: Secondary | ICD-10-CM | POA: Insufficient documentation

## 2014-08-06 DIAGNOSIS — I10 Essential (primary) hypertension: Secondary | ICD-10-CM | POA: Insufficient documentation

## 2014-08-06 DIAGNOSIS — R0989 Other specified symptoms and signs involving the circulatory and respiratory systems: Secondary | ICD-10-CM

## 2014-08-06 DIAGNOSIS — R69 Illness, unspecified: Secondary | ICD-10-CM | POA: Diagnosis not present

## 2014-08-06 DIAGNOSIS — Z136 Encounter for screening for cardiovascular disorders: Secondary | ICD-10-CM

## 2014-08-06 DIAGNOSIS — I2510 Atherosclerotic heart disease of native coronary artery without angina pectoris: Secondary | ICD-10-CM | POA: Diagnosis not present

## 2014-08-06 DIAGNOSIS — I6523 Occlusion and stenosis of bilateral carotid arteries: Secondary | ICD-10-CM | POA: Diagnosis not present

## 2014-08-09 DIAGNOSIS — Z87891 Personal history of nicotine dependence: Secondary | ICD-10-CM | POA: Diagnosis not present

## 2014-08-09 DIAGNOSIS — D649 Anemia, unspecified: Secondary | ICD-10-CM | POA: Diagnosis not present

## 2014-08-09 DIAGNOSIS — I482 Chronic atrial fibrillation: Secondary | ICD-10-CM | POA: Diagnosis not present

## 2014-08-09 DIAGNOSIS — I2510 Atherosclerotic heart disease of native coronary artery without angina pectoris: Secondary | ICD-10-CM | POA: Diagnosis not present

## 2014-08-09 DIAGNOSIS — I255 Ischemic cardiomyopathy: Secondary | ICD-10-CM | POA: Diagnosis not present

## 2014-08-09 DIAGNOSIS — I5022 Chronic systolic (congestive) heart failure: Secondary | ICD-10-CM | POA: Diagnosis not present

## 2014-08-09 DIAGNOSIS — F70 Mild intellectual disabilities: Secondary | ICD-10-CM | POA: Diagnosis not present

## 2014-08-09 DIAGNOSIS — I252 Old myocardial infarction: Secondary | ICD-10-CM | POA: Diagnosis not present

## 2014-08-09 DIAGNOSIS — I10 Essential (primary) hypertension: Secondary | ICD-10-CM | POA: Diagnosis not present

## 2014-08-09 DIAGNOSIS — J449 Chronic obstructive pulmonary disease, unspecified: Secondary | ICD-10-CM | POA: Diagnosis not present

## 2014-08-29 DIAGNOSIS — J449 Chronic obstructive pulmonary disease, unspecified: Secondary | ICD-10-CM | POA: Diagnosis not present

## 2014-09-03 DIAGNOSIS — I509 Heart failure, unspecified: Secondary | ICD-10-CM | POA: Diagnosis not present

## 2014-09-03 DIAGNOSIS — J449 Chronic obstructive pulmonary disease, unspecified: Secondary | ICD-10-CM | POA: Diagnosis not present

## 2014-09-03 DIAGNOSIS — I25119 Atherosclerotic heart disease of native coronary artery with unspecified angina pectoris: Secondary | ICD-10-CM | POA: Diagnosis not present

## 2014-09-03 DIAGNOSIS — M1990 Unspecified osteoarthritis, unspecified site: Secondary | ICD-10-CM | POA: Diagnosis not present

## 2014-09-04 NOTE — Progress Notes (Signed)
Patient ID: Alexis Dillon, female   DOB: 23-Jun-1945, 69 y.o.   MRN: [id number]   Primary [facility]  PCP:  Nillie Bartolotta is a 26 y/o first seen by Dr Acie Fredrickson in December of 2014.  Since then she has been seen by most of our practice except me. 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 She was admitted 07/14/2014 for severe chest pain.   She was initially called a code STEMI d/t inferolateral ST depressions on EKG (present from before), but w/ negative troponin on admission. Data reviewed by interventional cardiology, felt that given her multiple co-morbidities, she was not a good candidate for emergent cath. Note she has previously been evaluated for CABG, however d/t renal insufficiency and mild MR, the decision was previously made to manage her medically. She ruled out for MI. Her anti-anginal regimen was adjusted to include addition of Norvasc, Coreg, and Imdur. Her blood pressure may prohibit further titration. If chest pain recurs consider repeat cardiac catheterization +/- addition of Ranexa. From a CHF standpoint she was euvolemic during her stay. 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 She maintained NSR during her stay. By day of discharge she was ambulating with PT without chest pain. HHPT was ordered to increase endurance. Cr remained generally in the 1.8 range, baseline said to be 1.5-2.0. Other problems in the hospital and occluded anemia stable at 10, she had a rapid bite and received 5 rabies injections and antibiotics.,  Hyperkalemia.  Patient comes in today accompanied by grandson   No angina since last visit . She has no edema or worsening dyspnea. She also forgets some of her evening meds according to her grandson. Her grandson states that she will be compliant but she is taking her medications.  3/16  CR was 1.4   3.3/16  Hct 33.9   ROS: Denies fever, malais, weight loss, blurry vision, decreased visual acuity, cough, sputum, SOB, hemoptysis, pleuritic pain, palpitaitons, heartburn, abdominal pain, melena, lower extremity edema, claudication, or rash.  All other systems reviewed and negative  General: Affect appropriate Chronically ill white female  HEENT: normal Neck supple with no adenopathy JVP normal no bruits no thyromegaly Lungs poor air movement no wheezing and good diaphragmatic motion Heart:  S1/S2 no murmur, no rub, gallop or click PMI normal Abdomen: benighn, BS positve, no tenderness, no AAA no bruit.  No HSM or HJR Distal pulses intact with no bruits No edema Neuro non-focal Skin warm and dry No muscular weakness   Current Outpatient Prescriptions  Medication Sig Dispense Refill  . acetaminophen (TYLENOL) 325 MG tablet Take 2 tablets (650 mg total) by mouth every 6 (six) hours as needed for mild pain or fever.    Marland Kitchen albuterol (PROVENTIL HFA;VENTOLIN HFA) 108 (90 BASE) MCG/ACT inhaler Inhale 2 puffs into the lungs every 6 (six) hours as needed for wheezing or shortness of breath.    Marland Kitchen albuterol (PROVENTIL) (2.5 MG/3ML) 0.083% nebulizer solution Take 3 mLs (2.5 mg total) by nebulization every 2 (two) hours as  needed for wheezing or shortness of breath. 75 mL 12  . ALPRAZolam (XANAX) 0.25 MG tablet Take 0.25 mg by mouth 3 (three) times daily as needed for anxiety.     Marland Kitchen amiodarone (PACERONE) 200 MG tablet Take 1 tablet (200 mg total) by mouth daily. 60 tablet 1  . amLODipine (NORVASC) 2.5 MG tablet Take 1 tablet (2.5 mg total) by mouth daily. 30 tablet 6  . aspirin EC 81 MG tablet Take  1 tablet (81 mg total) by mouth daily. 30 tablet 6  . atorvastatin (LIPITOR) 80 MG tablet Take 80 mg by mouth daily.    . carvedilol (COREG) 6.25 MG tablet Take 1 tablet (6.25 mg total) by mouth 2 (two) times daily with a meal. 60 tablet 6  . cholecalciferol (VITAMIN D) 1000 UNITS tablet Take 2,000 Units by mouth daily.    . furosemide (LASIX) 20 MG tablet Take 20 mg by mouth daily.    Marland Kitchen HYDROcodone-acetaminophen (NORCO/VICODIN) 5-325 MG per tablet Take 1 tablet by mouth every 4 (four) hours as needed for moderate pain.     Marland Kitchen ipratropium-albuterol (DUONEB) 0.5-2.5 (3) MG/3ML SOLN Take 3 mLs by nebulization every 6 (six) hours as needed (SHORTNESS OF BREATH).    Marland Kitchen isosorbide mononitrate (IMDUR) 30 MG 24 hr tablet Take 1 tablet (30 mg total) by mouth daily. 30 tablet 6  . nitroGLYCERIN (NITROSTAT) 0.4 MG SL tablet Place 1 tablet (0.4 mg total) under the tongue every 5 (five) minutes as needed for chest pain (up to 3 doses). 25 tablet 3  . pantoprazole (PROTONIX) 40 MG tablet Take 40 mg by mouth daily.    . sodium chloride (OCEAN) 0.65 % SOLN nasal spray Place 1 spray into both nostrils as needed for congestion.  0   No current facility-administered medications for this visit.    Allergies  Review of patient's allergies indicates no known allergies.  Electrocardiogram: 3/16  SR rate 59  Inferior T wave changes ICRBBB   Assessment and Plan CAD:  Complex turned down for CABG.  Low BP limits Rx  No need for ranexa at this point doing well COPD:  Unwilling to stop smoking  F/U [facility]  Yearly CXR continue 24/7 oxygen sats ok on RA in office today Chol:  On statin labs with primary  F/U [place] in 3 months

## 2014-09-05 ENCOUNTER — Ambulatory Visit (INDEPENDENT_AMBULATORY_CARE_PROVIDER_SITE_OTHER): Payer: Medicare Other | Admitting: Cardiovascular Disease

## 2014-09-05 ENCOUNTER — Encounter: Payer: Self-pay | Admitting: Cardiovascular Disease

## 2014-09-05 VITALS — BP 98/52 | HR 61 | Ht 62.0 in | Wt 93.8 lb

## 2014-09-05 DIAGNOSIS — I2510 Atherosclerotic heart disease of native coronary artery without angina pectoris: Secondary | ICD-10-CM

## 2014-09-05 DIAGNOSIS — I2583 Coronary atherosclerosis due to lipid rich plaque: Principal | ICD-10-CM

## 2014-09-05 NOTE — Patient Instructions (Signed)
Your physician wants you to follow-up in: 3 months with Dr. Koneswaran. You will receive a reminder letter in the mail two months in advance. If you don't receive a letter, please call our office to schedule the follow-up appointment.  Your physician recommends that you continue on your current medications as directed. Please refer to the Current Medication list given to you today.  Thank you for choosing [facility]!   

## 2014-09-05 NOTE — Addendum Note (Signed)
Addended by: Levonne Hubert on: 09/05/2014 10:52 AM   Modules accepted: Level of Service

## 2014-09-13 DIAGNOSIS — B351 Tinea unguium: Secondary | ICD-10-CM | POA: Diagnosis not present

## 2014-09-13 DIAGNOSIS — M79676 Pain in unspecified toe(s): Secondary | ICD-10-CM | POA: Diagnosis not present

## 2014-09-19 ENCOUNTER — Telehealth: Payer: Self-pay | Admitting: *Deleted

## 2014-09-19 NOTE — Telephone Encounter (Signed)
-----   Message from Imogene Burn, PA-C sent at 08/07/2014  7:53 AM EDT ----- Carotids ok. Repeat in 1 yr.

## 2014-09-23 ENCOUNTER — Inpatient Hospital Stay (HOSPITAL_COMMUNITY)
Admission: EM | Admit: 2014-09-23 | Discharge: 2014-09-26 | DRG: 291 | Disposition: A | Discharge status: Home Health | Payer: Medicare Other | Attending: Pulmonary Disease | Admitting: Pulmonary Disease

## 2014-09-23 ENCOUNTER — Encounter (HOSPITAL_COMMUNITY): Payer: Self-pay | Admitting: *Deleted

## 2014-09-23 ENCOUNTER — Emergency Department (HOSPITAL_COMMUNITY): Payer: Medicare Other

## 2014-09-23 DIAGNOSIS — J440 Chronic obstructive pulmonary disease with acute lower respiratory infection: Secondary | ICD-10-CM

## 2014-09-23 DIAGNOSIS — I5043 Acute on chronic combined systolic (congestive) and diastolic (congestive) heart failure: Principal | ICD-10-CM

## 2014-09-23 DIAGNOSIS — I2510 Atherosclerotic heart disease of native coronary artery without angina pectoris: Secondary | ICD-10-CM | POA: Diagnosis not present

## 2014-09-23 DIAGNOSIS — D649 Anemia, unspecified: Secondary | ICD-10-CM | POA: Diagnosis not present

## 2014-09-23 DIAGNOSIS — J9602 Acute respiratory failure with hypercapnia: Secondary | ICD-10-CM

## 2014-09-23 DIAGNOSIS — N183 Chronic kidney disease, stage 3 unspecified: Secondary | ICD-10-CM | POA: Diagnosis present

## 2014-09-23 DIAGNOSIS — K219 Gastro-esophageal reflux disease without esophagitis: Secondary | ICD-10-CM | POA: Diagnosis not present

## 2014-09-23 DIAGNOSIS — J9622 Acute and chronic respiratory failure with hypercapnia: Secondary | ICD-10-CM

## 2014-09-23 DIAGNOSIS — I255 Ischemic cardiomyopathy: Secondary | ICD-10-CM | POA: Diagnosis present

## 2014-09-23 DIAGNOSIS — Z96641 Presence of right artificial hip joint: Secondary | ICD-10-CM | POA: Diagnosis not present

## 2014-09-23 DIAGNOSIS — Z8249 Family history of ischemic heart disease and other diseases of the circulatory system: Secondary | ICD-10-CM

## 2014-09-23 DIAGNOSIS — I129 Hypertensive chronic kidney disease with stage 1 through stage 4 chronic kidney disease, or unspecified chronic kidney disease: Secondary | ICD-10-CM | POA: Diagnosis present

## 2014-09-23 DIAGNOSIS — Z90710 Acquired absence of both cervix and uterus: Secondary | ICD-10-CM

## 2014-09-23 DIAGNOSIS — Z9119 Patient's noncompliance with other medical treatment and regimen: Secondary | ICD-10-CM | POA: Diagnosis present

## 2014-09-23 DIAGNOSIS — N289 Disorder of kidney and ureter, unspecified: Secondary | ICD-10-CM

## 2014-09-23 DIAGNOSIS — Z800 Family history of malignant neoplasm of digestive organs: Secondary | ICD-10-CM

## 2014-09-23 DIAGNOSIS — E872 Acidosis: Secondary | ICD-10-CM

## 2014-09-23 DIAGNOSIS — F70 Mild intellectual disabilities: Secondary | ICD-10-CM | POA: Diagnosis not present

## 2014-09-23 DIAGNOSIS — J441 Chronic obstructive pulmonary disease with (acute) exacerbation: Secondary | ICD-10-CM

## 2014-09-23 DIAGNOSIS — I480 Paroxysmal atrial fibrillation: Secondary | ICD-10-CM | POA: Diagnosis present

## 2014-09-23 DIAGNOSIS — J9620 Acute and chronic respiratory failure, unspecified whether with hypoxia or hypercapnia: Secondary | ICD-10-CM | POA: Diagnosis present

## 2014-09-23 DIAGNOSIS — R0602 Shortness of breath: Secondary | ICD-10-CM | POA: Diagnosis not present

## 2014-09-23 DIAGNOSIS — J810 Acute pulmonary edema: Secondary | ICD-10-CM | POA: Diagnosis not present

## 2014-09-23 DIAGNOSIS — F419 Anxiety disorder, unspecified: Secondary | ICD-10-CM | POA: Diagnosis not present

## 2014-09-23 DIAGNOSIS — R069 Unspecified abnormalities of breathing: Secondary | ICD-10-CM | POA: Diagnosis not present

## 2014-09-23 DIAGNOSIS — R0902 Hypoxemia: Secondary | ICD-10-CM | POA: Diagnosis not present

## 2014-09-23 DIAGNOSIS — Z833 Family history of diabetes mellitus: Secondary | ICD-10-CM | POA: Diagnosis not present

## 2014-09-23 DIAGNOSIS — F17210 Nicotine dependence, cigarettes, uncomplicated: Secondary | ICD-10-CM | POA: Diagnosis present

## 2014-09-23 DIAGNOSIS — Z66 Do not resuscitate: Secondary | ICD-10-CM | POA: Diagnosis not present

## 2014-09-23 DIAGNOSIS — J449 Chronic obstructive pulmonary disease, unspecified: Secondary | ICD-10-CM | POA: Diagnosis present

## 2014-09-23 DIAGNOSIS — E785 Hyperlipidemia, unspecified: Secondary | ICD-10-CM | POA: Diagnosis present

## 2014-09-23 DIAGNOSIS — I10 Essential (primary) hypertension: Secondary | ICD-10-CM | POA: Diagnosis not present

## 2014-09-23 DIAGNOSIS — I5041 Acute combined systolic (congestive) and diastolic (congestive) heart failure: Secondary | ICD-10-CM

## 2014-09-23 DIAGNOSIS — Z9981 Dependence on supplemental oxygen: Secondary | ICD-10-CM

## 2014-09-23 DIAGNOSIS — J9691 Respiratory failure, unspecified with hypoxia: Secondary | ICD-10-CM | POA: Diagnosis not present

## 2014-09-23 DIAGNOSIS — I5042 Chronic combined systolic (congestive) and diastolic (congestive) heart failure: Secondary | ICD-10-CM | POA: Diagnosis present

## 2014-09-23 DIAGNOSIS — M1990 Unspecified osteoarthritis, unspecified site: Secondary | ICD-10-CM | POA: Diagnosis present

## 2014-09-23 LAB — MRSA PCR SCREENING: MRSA by PCR: NEGATIVE

## 2014-09-23 LAB — BLOOD GAS, VENOUS
Acid-base deficit: 1.7 mmol/L (ref 0.0–2.0)
Bicarbonate: 25.4 mEq/L — ABNORMAL HIGH (ref 20.0–24.0)
Drawn by: 105551
FIO2: 100 %
O2 Saturation: 74.1 %
Patient temperature: 37
TCO2: 24.6 mmol/L (ref 0–100)
pCO2, Ven: 66.8 mmHg — ABNORMAL HIGH (ref 45.0–50.0)
pH, Ven: 7.204 — ABNORMAL LOW (ref 7.250–7.300)
pO2, Ven: 49 mmHg — ABNORMAL HIGH (ref 30.0–45.0)

## 2014-09-23 LAB — CBC
HCT: 37.9 % (ref 36.0–46.0)
Hemoglobin: 11.2 g/dL — ABNORMAL LOW (ref 12.0–15.0)
MCH: 26.7 pg (ref 26.0–34.0)
MCHC: 29.6 g/dL — ABNORMAL LOW (ref 30.0–36.0)
MCV: 90.5 fL (ref 78.0–100.0)
Platelets: 234 10*3/uL (ref 150–400)
RBC: 4.19 MIL/uL (ref 3.87–5.11)
RDW: 16.2 % — ABNORMAL HIGH (ref 11.5–15.5)
WBC: 9.7 10*3/uL (ref 4.0–10.5)

## 2014-09-23 LAB — CBC WITH DIFFERENTIAL/PLATELET
BASOS ABS: 0.1 10*3/uL (ref 0.0–0.1)
BASOS PCT: 0 % (ref 0–1)
EOS ABS: 0.4 10*3/uL (ref 0.0–0.7)
EOS PCT: 3 % (ref 0–5)
HEMATOCRIT: 35.8 % — AB (ref 36.0–46.0)
Hemoglobin: 10.8 g/dL — ABNORMAL LOW (ref 12.0–15.0)
Lymphocytes Relative: 32 % (ref 12–46)
Lymphs Abs: 4.5 10*3/uL — ABNORMAL HIGH (ref 0.7–4.0)
MCH: 27.3 pg (ref 26.0–34.0)
MCHC: 30.2 g/dL (ref 30.0–36.0)
MCV: 90.4 fL (ref 78.0–100.0)
MONO ABS: 0.9 10*3/uL (ref 0.1–1.0)
Monocytes Relative: 6 % (ref 3–12)
NEUTROS ABS: 8.4 10*3/uL — AB (ref 1.7–7.7)
Neutrophils Relative %: 59 % (ref 43–77)
Platelets: 264 10*3/uL (ref 150–400)
RBC: 3.96 MIL/uL (ref 3.87–5.11)
RDW: 16.3 % — AB (ref 11.5–15.5)
WBC: 14.2 10*3/uL — ABNORMAL HIGH (ref 4.0–10.5)

## 2014-09-23 LAB — COMPREHENSIVE METABOLIC PANEL
ALBUMIN: 3.6 g/dL (ref 3.5–5.0)
ALT: 23 U/L (ref 14–54)
AST: 40 U/L (ref 15–41)
Alkaline Phosphatase: 154 U/L — ABNORMAL HIGH (ref 38–126)
Anion gap: 7 (ref 5–15)
BUN: 19 mg/dL (ref 6–20)
CALCIUM: 8.7 mg/dL — AB (ref 8.9–10.3)
CO2: 24 mmol/L (ref 22–32)
CREATININE: 1.56 mg/dL — AB (ref 0.44–1.00)
Chloride: 110 mmol/L (ref 101–111)
GFR calc Af Amer: 38 mL/min — ABNORMAL LOW (ref >60)
GFR, EST NON AFRICAN AMERICAN: 33 mL/min — AB (ref >60)
Glucose, Bld: 156 mg/dL — ABNORMAL HIGH (ref 70–99)
Potassium: 4.1 mmol/L (ref 3.5–5.1)
SODIUM: 141 mmol/L (ref 135–145)
Total Bilirubin: 0.5 mg/dL (ref 0.3–1.2)
Total Protein: 6.5 g/dL (ref 6.5–8.1)

## 2014-09-23 LAB — TROPONIN I

## 2014-09-23 LAB — CREATININE, SERUM
CREATININE: 1.6 mg/dL — AB (ref 0.44–1.00)
GFR calc non Af Amer: 32 mL/min — ABNORMAL LOW (ref >60)
GFR, EST AFRICAN AMERICAN: 37 mL/min — AB (ref >60)

## 2014-09-23 LAB — BRAIN NATRIURETIC PEPTIDE: B Natriuretic Peptide: 1064 pg/mL — ABNORMAL HIGH (ref 0.0–100.0)

## 2014-09-23 MED ORDER — FUROSEMIDE 10 MG/ML IJ SOLN
60.0000 mg | Freq: Once | INTRAMUSCULAR | Status: AC
  Administered 2014-09-23: 60 mg via INTRAVENOUS
  Filled 2014-09-23: qty 6

## 2014-09-23 MED ORDER — FUROSEMIDE 10 MG/ML IJ SOLN
40.0000 mg | Freq: Two times a day (BID) | INTRAMUSCULAR | Status: DC
  Administered 2014-09-23 – 2014-09-25 (×4): 40 mg via INTRAVENOUS
  Filled 2014-09-23 (×4): qty 4

## 2014-09-23 MED ORDER — NITROGLYCERIN 2 % TD OINT
0.5000 [in_us] | TOPICAL_OINTMENT | Freq: Once | TRANSDERMAL | Status: AC
  Administered 2014-09-23: 0.5 [in_us] via TOPICAL
  Filled 2014-09-23: qty 1

## 2014-09-23 MED ORDER — METHYLPREDNISOLONE SODIUM SUCC 125 MG IJ SOLR: 125.0000 mg | Freq: Once | INTRAMUSCULAR | Status: DC

## 2014-09-23 MED ORDER — SODIUM CHLORIDE 0.9 % IJ SOLN
3.0000 mL | Freq: Two times a day (BID) | INTRAMUSCULAR | Status: DC
  Administered 2014-09-24 – 2014-09-26 (×4): 3 mL via INTRAVENOUS

## 2014-09-23 MED ORDER — HEPARIN SODIUM (PORCINE) 5000 UNIT/ML IJ SOLN
5000.0000 [IU] | Freq: Three times a day (TID) | INTRAMUSCULAR | Status: DC
  Administered 2014-09-23 – 2014-09-26 (×9): 5000 [IU] via SUBCUTANEOUS
  Filled 2014-09-23 (×9): qty 1

## 2014-09-23 MED ORDER — CETYLPYRIDINIUM CHLORIDE 0.05 % MT LIQD
7.0000 mL | Freq: Two times a day (BID) | OROMUCOSAL | Status: DC
  Administered 2014-09-23 (×2): 7 mL via OROMUCOSAL

## 2014-09-23 MED ORDER — ALBUTEROL SULFATE (2.5 MG/3ML) 0.083% IN NEBU
2.5000 mg | INHALATION_SOLUTION | Freq: Once | RESPIRATORY_TRACT | Status: AC
  Administered 2014-09-23: 2.5 mg via RESPIRATORY_TRACT
  Filled 2014-09-23: qty 3

## 2014-09-23 MED ORDER — ATORVASTATIN CALCIUM 40 MG PO TABS
80.0000 mg | ORAL_TABLET | Freq: Every day | ORAL | Status: DC
  Administered 2014-09-24 – 2014-09-26 (×3): 80 mg via ORAL
  Filled 2014-09-23: qty 2
  Filled 2014-09-23: qty 4
  Filled 2014-09-23: qty 2

## 2014-09-23 MED ORDER — SODIUM CHLORIDE 0.9 % IV SOLN: 250.0000 mL | INTRAVENOUS | Status: DC | PRN

## 2014-09-23 MED ORDER — ASPIRIN EC 81 MG PO TBEC
81.0000 mg | DELAYED_RELEASE_TABLET | Freq: Every day | ORAL | Status: DC
  Administered 2014-09-24 – 2014-09-26 (×3): 81 mg via ORAL
  Filled 2014-09-23 (×3): qty 1

## 2014-09-23 MED ORDER — ALBUTEROL (5 MG/ML) CONTINUOUS INHALATION SOLN
10.0000 mg/h | INHALATION_SOLUTION | RESPIRATORY_TRACT | Status: DC
  Administered 2014-09-23: 10 mg/h via RESPIRATORY_TRACT
  Filled 2014-09-23: qty 20

## 2014-09-23 MED ORDER — MORPHINE SULFATE 2 MG/ML IJ SOLN
1.0000 mg | INTRAMUSCULAR | Status: DC | PRN
  Administered 2014-09-23 – 2014-09-24 (×2): 1 mg via INTRAVENOUS
  Filled 2014-09-23 (×2): qty 1

## 2014-09-23 MED ORDER — FUROSEMIDE 10 MG/ML IJ SOLN: 40.0000 mg | Freq: Once | INTRAMUSCULAR | Status: DC

## 2014-09-23 MED ORDER — ISOSORBIDE MONONITRATE ER 60 MG PO TB24
30.0000 mg | ORAL_TABLET | Freq: Every day | ORAL | Status: DC
  Administered 2014-09-24 – 2014-09-26 (×3): 30 mg via ORAL
  Filled 2014-09-23 (×3): qty 1

## 2014-09-23 MED ORDER — METHYLPREDNISOLONE SODIUM SUCC 125 MG IJ SOLR
60.0000 mg | Freq: Four times a day (QID) | INTRAMUSCULAR | Status: DC
  Administered 2014-09-23 – 2014-09-25 (×9): 60 mg via INTRAVENOUS
  Filled 2014-09-23 (×9): qty 2

## 2014-09-23 MED ORDER — IPRATROPIUM-ALBUTEROL 0.5-2.5 (3) MG/3ML IN SOLN
3.0000 mL | RESPIRATORY_TRACT | Status: DC
  Administered 2014-09-23 – 2014-09-25 (×12): 3 mL via RESPIRATORY_TRACT
  Filled 2014-09-23 (×12): qty 3

## 2014-09-23 MED ORDER — CHLORHEXIDINE GLUCONATE 0.12 % MT SOLN
15.0000 mL | Freq: Two times a day (BID) | OROMUCOSAL | Status: DC
  Administered 2014-09-23 – 2014-09-25 (×4): 15 mL via OROMUCOSAL
  Filled 2014-09-23 (×3): qty 15

## 2014-09-23 MED ORDER — ACETAMINOPHEN 325 MG PO TABS
650.0000 mg | ORAL_TABLET | ORAL | Status: DC | PRN
  Administered 2014-09-24: 650 mg via ORAL
  Filled 2014-09-23: qty 2

## 2014-09-23 MED ORDER — ONDANSETRON HCL 4 MG/2ML IJ SOLN
4.0000 mg | Freq: Four times a day (QID) | INTRAMUSCULAR | Status: DC | PRN
Start: 2014-09-23 — End: 2014-09-26

## 2014-09-23 MED ORDER — CARVEDILOL 3.125 MG PO TABS
6.2500 mg | ORAL_TABLET | Freq: Two times a day (BID) | ORAL | Status: DC
  Administered 2014-09-23 – 2014-09-26 (×6): 6.25 mg via ORAL
  Filled 2014-09-23 (×6): qty 2

## 2014-09-23 MED ORDER — ALBUTEROL SULFATE (2.5 MG/3ML) 0.083% IN NEBU
5.0000 mg | INHALATION_SOLUTION | Freq: Once | RESPIRATORY_TRACT | Status: AC
  Administered 2014-09-23: 5 mg via RESPIRATORY_TRACT
  Filled 2014-09-23: qty 6

## 2014-09-23 MED ORDER — IPRATROPIUM BROMIDE 0.02 % IN SOLN
0.5000 mg | Freq: Once | RESPIRATORY_TRACT | Status: AC
  Administered 2014-09-23: 0.5 mg via RESPIRATORY_TRACT
  Filled 2014-09-23: qty 2.5

## 2014-09-23 MED ORDER — IPRATROPIUM BROMIDE 0.02 % IN SOLN
RESPIRATORY_TRACT | Status: AC
  Administered 2014-09-23: 0.5 mg
  Filled 2014-09-23: qty 2.5

## 2014-09-23 MED ORDER — ALBUTEROL SULFATE (2.5 MG/3ML) 0.083% IN NEBU
INHALATION_SOLUTION | RESPIRATORY_TRACT | Status: AC
  Administered 2014-09-23: 5 mg
  Filled 2014-09-23: qty 6

## 2014-09-23 MED ORDER — ALPRAZOLAM 0.25 MG PO TABS
0.2500 mg | ORAL_TABLET | Freq: Two times a day (BID) | ORAL | Status: DC | PRN
  Administered 2014-09-23 – 2014-09-25 (×3): 0.25 mg via ORAL
  Filled 2014-09-23 (×3): qty 1

## 2014-09-23 MED ORDER — SODIUM CHLORIDE 0.9 % IJ SOLN: 3.0000 mL | INTRAMUSCULAR | Status: DC | PRN

## 2014-09-23 MED ORDER — PANTOPRAZOLE SODIUM 40 MG PO TBEC
40.0000 mg | DELAYED_RELEASE_TABLET | Freq: Every day | ORAL | Status: DC
  Administered 2014-09-24 – 2014-09-26 (×3): 40 mg via ORAL
  Filled 2014-09-23 (×3): qty 1

## 2014-09-23 MED ORDER — LORAZEPAM 2 MG/ML IJ SOLN
0.5000 mg | Freq: Once | INTRAMUSCULAR | Status: AC
  Administered 2014-09-23: 0.5 mg via INTRAVENOUS
  Filled 2014-09-23: qty 1

## 2014-09-23 MED ORDER — IPRATROPIUM-ALBUTEROL 0.5-2.5 (3) MG/3ML IN SOLN
3.0000 mL | RESPIRATORY_TRACT | Status: DC
  Administered 2014-09-23: 3 mL via RESPIRATORY_TRACT
  Filled 2014-09-23: qty 3

## 2014-09-23 MED ORDER — AMLODIPINE BESYLATE 5 MG PO TABS
2.5000 mg | ORAL_TABLET | Freq: Every day | ORAL | Status: DC
  Administered 2014-09-24 – 2014-09-26 (×3): 2.5 mg via ORAL
  Filled 2014-09-23 (×3): qty 1

## 2014-09-23 NOTE — Progress Notes (Signed)
INITIAL NUTRITION ASSESSMENT  DOCUMENTATION CODES Per approved criteria  -Underweight   INTERVENTION: -Once diet advanced, Ensure Enlive po BID, each supplement provides 350 kcal and 20 grams of protein  -Once diet advanced, Snacks per pt request  -Recommend pt receives Na/fluid restriction diet education when it is appropriate.  NUTRITION DIAGNOSIS: Increased protein/calorie needs related to COPD/CHF as evidenced by BMI <18.   Goal: Pt to meet >/= 90% of their estimated nutrition needs   Diet advanced within 24 hours  Monitor:  Diet order, Appropriate time for diet education, oral intake, fluid status, labs   Reason for Assessment: MST  69 y.o. female  Admitting Dx: <principal problem not specified>  ASSESSMENT: 69 y.o. female PMHx COPD, CHF, CRF,  on home oxygen as needed. She presented to the ED today with complaints of shortness of breath  Pt/Family states that there really have not been any acute changes in pts appetite. Pt doesn't really follow a Na/fluid restriction diet and family members think she needs diet education on the diet. They say she drinks much more fluid then she is supposed to.   Pt states normal weight is 99 lbs and family says she has weighed about the same since her MI 2 years ago. They do not notice any difference in her physical appearance. She definitely has some muscle wasting, but it is not related to this admittance  Pt states she has an appetite now and is eagerly waiting her diet to be advanced. Asked for snacks  Nutrition Focused Physical Exam:  Subcutaneous Fat:  Orbital Region: well nourished Upper Arm Region: n/a Thoracic and Lumbar Region: n/a  Muscle:  Temple Region: n/a (bipap on ) Clavicle Bone Region: mild loss (not new) Clavicle and Acromion Bone Region: mild loss (not new) Scapular Bone Region: n/a Dorsal Hand: well nourished Patellar Region: n/a Anterior Thigh Region: mild loss (not new) Posterior Calf Region: mild  loss (not new)  Edema: none noted  Height: Ht Readings from Last 1 Encounters:  09/23/14 5\' 2"  (1.575 m)    Weight: Wt Readings from Last 1 Encounters:  09/23/14 98 lb 1.7 oz (44.5 kg)    Ideal Body Weight:110 lbs  % Ideal Body Weight: 89%  Wt Readings from Last 10 Encounters:  09/23/14 98 lb 1.7 oz (44.5 kg)  09/05/14 93 lb 12.8 oz (42.547 kg)  07/31/14 94 lb (42.638 kg)  07/18/14 93 lb 11.2 oz (42.502 kg)  06/27/14 94 lb 9.6 oz (42.91 kg)  04/10/14 102 lb (46.267 kg)  10/11/13 110 lb 12.8 oz (50.259 kg)  09/03/13 108 lb (48.988 kg)  05/23/13 98 lb 12 oz (44.793 kg)  05/11/13 100 lb 1.4 oz (45.4 kg)    Usual Body Weight: 98-105 lbs  % Usual Body Weight: 100%  BMI:  Body mass index is 17.94 kg/(m^2).  Estimated Nutritional Needs: Kcal: 1550-1750 (35-40 kcal/kg) Protein: 67 g Pro (1.5 g/kg) Fluid: per md  Skin: WDL  Diet Order: Diet NPO time specified Except for: Sips with Meds  EDUCATION NEEDS: -Education needs addressed   Intake/Output Summary (Last 24 hours) at 09/23/14 1724 Last data filed at 09/23/14 1624  Gross per 24 hour  Intake      0 ml  Output   1800 ml  Net  -1800 ml    Last BM: unknown  Labs:   Recent Labs Lab 09/23/14 0609 09/23/14 0914  NA 141  --   K 4.1  --   CL 110  --   CO2  24  --   BUN 19  --   CREATININE 1.56* 1.60*  CALCIUM 8.7*  --   GLUCOSE 156*  --     CBG (last 3)  No results for input(s): GLUCAP in the last 72 hours.  Scheduled Meds: . amLODipine  2.5 mg Oral Daily  . antiseptic oral rinse  7 mL Mouth Rinse q12n4p  . aspirin EC  81 mg Oral Daily  . atorvastatin  80 mg Oral Daily  . carvedilol  6.25 mg Oral BID WC  . chlorhexidine  15 mL Mouth Rinse BID  . furosemide  40 mg Intravenous BID  . heparin  5,000 Units Subcutaneous 3 times per day  . ipratropium-albuterol  3 mL Nebulization Q4H  . isosorbide mononitrate  30 mg Oral Daily  . methylPREDNISolone (SOLU-MEDROL) injection  60 mg Intravenous Q6H   . pantoprazole  40 mg Oral Daily  . sodium chloride  3 mL Intravenous Q12H    Continuous Infusions:   Past Medical History  Diagnosis Date  . COPD (chronic obstructive pulmonary disease)   . Tobacco abuse   . Hypertension   . History of atrial septal defect     Closure in 50's   . Mental retardation, idiopathic mild   . Arthritis   . NSTEMI (non-ST elevated myocardial infarction)   . PAF (paroxysmal atrial fibrillation) 04/2013    a. s/p prior TEE/DCCV. b. Eliquis stopped after GIB/anemia in 03/2014.  Marland Kitchen GERD (gastroesophageal reflux disease)   . Atrophy of right kidney   . Chronic systolic CHF (congestive heart failure)   . Diverticulosis   . Hiatal hernia   . Barrett esophagus   . Ischemic cardiomyopathy     a. 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  Marland Kitchen CAD (coronary artery disease) 04/2013    a. cath 04/2013 showed RCA, LCx occlusion, 50% mid-LAD stenosis, 80% distal stenosis, and first diagonal branch w/ 75% ostial stenosis. previously been evaluated for CABG, however d/t renal insufficiency and mild MR, the decision was previously made to manage her medically.  . Anemia   . GI bleed     a.  GIB/symptomatic anemia Hgb 6 in 03/2014 (colonoscopy - small bowel AVMs/erosions).  . Rat bite     a. Tx with rabies vaccines.  . Hyperkalemia   . CKD (chronic kidney disease), stage III   . Chronic respiratory failure     Past Surgical History  Procedure Laterality Date  . Asd repair      in her 62s  . Total hip arthroplasty Right     in her 76s  . Total abdominal hysterectomy      in the 2s  . Tee without cardioversion N/A 05/08/2013    Procedure: TRANSESOPHAGEAL ECHOCARDIOGRAM (TEE);  Surgeon: Lelon Perla, MD;  Location: [facility] ENDOSCOPY;  Service: Cardiovascular;  Laterality: N/A;  . Cardioversion N/A 05/08/2013    Procedure: CARDIOVERSION;  Surgeon: Lelon Perla, MD;  Location: [facility] ENDOSCOPY;  Service: Cardiovascular;  Laterality: N/A;  Dr.  Zoila Shutter verified with EP pt in a flutter...will cardiovert...  150 joules @ 4:40, using Propofol 40  mg/IV .Marland Kitchen.successful NSR   . Tonsillectomy    . Colonoscopy N/A 10/24/2013    Dr. Gala Romney: 3 polyps, one removed piecemeal (tubulovillous adenoma). Next TCS 04/2014  . Esophagogastroduodenoscopy N/A 10/24/2013    Dr. Rourk:Barrett's without dysplasia. Next EGD 10/2014  . Cardiac catheterization  04/2013    Left mainstem: LM calcified with long 30%  stenosis. Left anterior descending (LAD):  The LAD is large and wraps the apex.  The mid vessel has 50% stenosis followed by a focal 80% stenosis.  D1 small with ostial 75% stenosis.Left anterior descending (LAD):  The LAD is large and wraps the apex.  The mid vessel has 50% stenosis followed by a focal 80% stenosis.  D1 small with ostial 75% stenosis.     . Agile capsule N/A 04/05/2014    Procedure: AGILE CAPSULE;  Surgeon: Danie Binder, MD;  Location: [facility] ENDO SUITE;  Service: Endoscopy;  Laterality: N/A;  . Colonoscopy N/A 04/10/2014    Procedure: COLONOSCOPY;  Surgeon: Daneil Dolin, MD;  Location: [facility] ENDO SUITE;  Service: Endoscopy;  Laterality: N/A;  . Givens capsule study N/A 04/17/2014    Procedure: GIVENS CAPSULE STUDY;  Surgeon: Daneil Dolin, MD;  Location: [facility] ENDO SUITE;  Service: Endoscopy;  Laterality: N/A;  . Left and right heart catheterization with coronary angiogram N/A 05/04/2013    Procedure: LEFT AND RIGHT HEART CATHETERIZATION WITH CORONARY ANGIOGRAM;  Surgeon: Minus Breeding, MD;  Location: [facility] CATH LAB;  Service: Cardiovascular;  Laterality: N/A;    Burtis Junes RD, LDN Nutrition Pager: [phone] 09/23/2014 5:24 PM

## 2014-09-23 NOTE — H&P (Signed)
[facility] History and Physical  Alexis Dillon LAG:[id number] DOB: Nov 27, 1945 DOA: 09/23/2014  Referring physician: Dr. Eliane Decree, ER PCP: Alonza Bogus, MD   Chief Complaint: shortness of breath  HPI: Alexis Dillon is a 69 y.o. female with a history of COPD, chronic combined congestive heart failure, chronic respiratory failure on home oxygen as needed. She presented to the emergency room today with complaints of shortness of breath. According to her family, she woke up this morning around 4 AM complaining of shortness of breath. They report that yesterday she was in her usual state of health and did not have any complaints. She was noted to wheezing, coughing this morning. They did not note any recently came. They report she is noncompliant with her fluid intake. She did not complain of any chest pain, fever, nausea or vomiting. She is dilated in the emergency room and noted to be hypoxic requiring supplemental oxygen. ABG was obtained that showed elevated PCO2 and low pH. She was placed on BiPAP therapy. She stated that for further treatment   Review of Systems:  Limited history since patient is in distress. Pertinent positives as per HPI, otherwise negative  Past Medical History  Diagnosis Date  . COPD (chronic obstructive pulmonary disease)   . Tobacco abuse   . Hypertension   . History of atrial septal defect     Closure in 50's   . Mental retardation, idiopathic mild   . Arthritis   . NSTEMI (non-ST elevated myocardial infarction)   . PAF (paroxysmal atrial fibrillation) 04/2013    a. s/p prior TEE/DCCV. b. Eliquis stopped after GIB/anemia in 03/2014.  Marland Kitchen GERD (gastroesophageal reflux disease)   . Atrophy of right kidney   . Chronic systolic CHF (congestive heart failure)   . Diverticulosis   . Hiatal hernia   . Barrett esophagus   . Ischemic cardiomyopathy     a. 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  Marland Kitchen CAD (coronary artery disease) 04/2013   a. cath 04/2013 showed RCA, LCx occlusion, 50% mid-LAD stenosis, 80% distal stenosis, and first diagonal branch w/ 75% ostial stenosis. previously been evaluated for CABG, however d/t renal insufficiency and mild MR, the decision was previously made to manage her medically.  . Anemia   . GI bleed     a.  GIB/symptomatic anemia Hgb 6 in 03/2014 (colonoscopy - small bowel AVMs/erosions).  . Rat bite     a. Tx with rabies vaccines.  . Hyperkalemia   . CKD (chronic kidney disease), stage III   . Chronic respiratory failure    Past Surgical History  Procedure Laterality Date  . Asd repair      in her 35s  . Total hip arthroplasty Right     in her 60s  . Total abdominal hysterectomy      in the 65s  . Tee without cardioversion N/A 05/08/2013    Procedure: TRANSESOPHAGEAL ECHOCARDIOGRAM (TEE);  Surgeon: Lelon Perla, MD;  Location: [facility] ENDOSCOPY;  Service: Cardiovascular;  Laterality: N/A;  . Cardioversion N/A 05/08/2013    Procedure: CARDIOVERSION;  Surgeon: Lelon Perla, MD;  Location: [facility] ENDOSCOPY;  Service: Cardiovascular;  Laterality: N/A;  Dr. Zoila Shutter verified with EP pt in a flutter...will cardiovert...  150 joules @ 4:40, using Propofol 40  mg/IV .Marland Kitchen.successful NSR   . Tonsillectomy    . Colonoscopy N/A 10/24/2013    Dr. Gala Romney: 3 polyps, one removed piecemeal (tubulovillous adenoma). Next TCS 04/2014  . Esophagogastroduodenoscopy N/A  10/24/2013    Dr. Rourk:Barrett's without dysplasia. Next EGD 10/2014  . Cardiac catheterization  04/2013    Left mainstem: LM calcified with long 30% stenosis. Left anterior descending (LAD):  The LAD is large and wraps the apex.  The mid vessel has 50% stenosis followed by a focal 80% stenosis.  D1 small with ostial 75% stenosis.Left anterior descending (LAD):  The LAD is large and wraps the apex.  The mid vessel has 50% stenosis followed by a focal 80% stenosis.  D1 small with ostial 75% stenosis.     . Agile capsule N/A 04/05/2014    Procedure:  AGILE CAPSULE;  Surgeon: Danie Binder, MD;  Location: [facility] ENDO SUITE;  Service: Endoscopy;  Laterality: N/A;  . Colonoscopy N/A 04/10/2014    Procedure: COLONOSCOPY;  Surgeon: Daneil Dolin, MD;  Location: [facility] ENDO SUITE;  Service: Endoscopy;  Laterality: N/A;  . Givens capsule study N/A 04/17/2014    Procedure: GIVENS CAPSULE STUDY;  Surgeon: Daneil Dolin, MD;  Location: [facility] ENDO SUITE;  Service: Endoscopy;  Laterality: N/A;  . Left and right heart catheterization with coronary angiogram N/A 05/04/2013    Procedure: LEFT AND RIGHT HEART CATHETERIZATION WITH CORONARY ANGIOGRAM;  Surgeon: Minus Breeding, MD;  Location: [facility] CATH LAB;  Service: Cardiovascular;  Laterality: N/A;   Social History:  reports that she has been smoking Cigarettes.  She started smoking about 56 years ago. She has a 55 pack-year smoking history. She has never used smokeless tobacco. She reports that she does not drink alcohol or use illicit drugs.  No Known Allergies  Family History  Problem Relation Age of Onset  . Heart attack Brother   . Heart attack Brother   . Diabetes Mother   . Heart attack Father     Died at 19 from MI  . Heart attack Sister     stent at 29  . Heart attack Sister     stent at 106  . Cancer Brother     lung  . Cancer Brother     lung   . Colon cancer Brother     deceased, diagnosed in early 61s    Prior to Admission medications   Medication Sig Start Date End Date Taking? Authorizing Provider  acetaminophen (TYLENOL) 325 MG tablet Take 2 tablets (650 mg total) by mouth every 6 (six) hours as needed for mild pain or fever. 05/11/13   Modena Jansky, MD  albuterol (PROVENTIL HFA;VENTOLIN HFA) 108 (90 BASE) MCG/ACT inhaler Inhale 2 puffs into the lungs every 6 (six) hours as needed for wheezing or shortness of breath.    Historical Provider, MD  albuterol (PROVENTIL) (2.5 MG/3ML) 0.083% nebulizer solution Take 3 mLs (2.5 mg total) by nebulization every 2 (two) hours as needed for  wheezing or shortness of breath. 04/10/14   Sinda Du, MD  amLODipine (NORVASC) 2.5 MG tablet Take 1 tablet (2.5 mg total) by mouth daily. 07/18/14   Dayna N Dunn, PA-C  aspirin EC 81 MG tablet Take 1 tablet (81 mg total) by mouth daily. 07/18/14   Dayna N Dunn, PA-C  atorvastatin (LIPITOR) 80 MG tablet Take 80 mg by mouth daily.    Historical Provider, MD  carvedilol (COREG) 6.25 MG tablet Take 1 tablet (6.25 mg total) by mouth 2 (two) times daily with a meal. 07/18/14   Dayna N Dunn, PA-C  cholecalciferol (VITAMIN D) 1000 UNITS tablet Take 2,000 Units by mouth daily.    Historical Provider, MD  furosemide (  LASIX) 20 MG tablet Take 20 mg by mouth daily.    Historical Provider, MD  ipratropium-albuterol (DUONEB) 0.5-2.5 (3) MG/3ML SOLN Take 3 mLs by nebulization every 6 (six) hours as needed (SHORTNESS OF BREATH).    Historical Provider, MD  isosorbide mononitrate (IMDUR) 30 MG 24 hr tablet Take 1 tablet (30 mg total) by mouth daily. 07/18/14   Dayna N Dunn, PA-C  pantoprazole (PROTONIX) 40 MG tablet Take 40 mg by mouth daily.    Historical Provider, MD   Physical Exam: Filed Vitals:   09/23/14 0530 09/23/14 0645 09/23/14 0658 09/23/14 0717  BP: 147/67   161/73  Pulse: 70 72  74  Temp:      TempSrc:      Resp:  45  25  Height:      Weight:      SpO2: 92% 96% 99% 84%    Wt Readings from Last 3 Encounters:  09/23/14 43.999 kg (97 lb)  09/05/14 42.547 kg (93 lb 12.8 oz)  07/31/14 42.638 kg (94 lb)    General:  Patient is sitting up in bed. She appears to be in distress, hollering out, very anxious Eyes: PERRL, normal lids, irises & conjunctiva ENT: grossly normal hearing, lips & tongue Neck: no LAD, masses or thyromegaly Cardiovascular: irregular, no m/r/g. No LE edema.  Respiratory: bilateral wheezing, crackles at bases, increased resp effort Abdomen: soft, nt, nd, bs+ Skin: no rash or induration seen on limited exam Musculoskeletal: grossly normal tone BUE/BLE Psychiatric: unable  to assess since patient is in distress Neurologic: grossly non-focal.          Labs on Admission:  Basic Metabolic Panel:  Recent Labs Lab 09/23/14 0609  NA 141  K 4.1  CL 110  CO2 24  GLUCOSE 156*  BUN 19  CREATININE 1.56*  CALCIUM 8.7*   Liver Function Tests:  Recent Labs Lab 09/23/14 0609  AST 40  ALT 23  ALKPHOS 154*  BILITOT 0.5  PROT 6.5  ALBUMIN 3.6   No results for input(s): LIPASE, AMYLASE in the last 168 hours. No results for input(s): AMMONIA in the last 168 hours. CBC:  Recent Labs Lab 09/23/14 0609  WBC 14.2*  NEUTROABS 8.4*  HGB 10.8*  HCT 35.8*  MCV 90.4  PLT 264   Cardiac Enzymes:  Recent Labs Lab 09/23/14 0609  TROPONINI <0.03    BNP (last 3 results)  Recent Labs  07/14/14 2044 09/23/14 0609  BNP 510.9* 1064.0*    ProBNP (last 3 results)  Recent Labs  04/04/14 1251  PROBNP 7846.0*    CBG: No results for input(s): GLUCAP in the last 168 hours.  Radiological Exams on Admission: Dg Chest Portable 1 View  09/23/2014   CLINICAL DATA:  Sudden onset shortness of breath. History of COPD. CAD. CHF.  EXAM: PORTABLE CHEST - 1 VIEW  COMPARISON:  07/14/2014  FINDINGS: Postoperative changes in the mediastinum. Cardiac enlargement, increasing since previous study. Increased pulmonary vascular congestion. Interstitial changes suggesting interstitial edema. No blunting of costophrenic angles. No pneumothorax. Mediastinal contours appear intact.  IMPRESSION: Development of congestive changes with cardiac enlargement, pulmonary vascular congestion, and interstitial edema.   Electronically Signed   By: Lucienne Capers M.D.   On: 09/23/2014 05:46    EKG: Independently reviewed. Atrial fibrillation with RBBB  Assessment/Plan Active Problems:   COPD (chronic obstructive pulmonary disease)   Hypertension   Mental retardation, idiopathic mild   CKD (chronic kidney disease), stage III   Hyperlipidemia   Anemia  Acute on chronic  respiratory failure with hypercapnia   Acute on chronic combined systolic and diastolic CHF (congestive heart failure)   Acute on chronic respiratory failure   1. Acute on chronic respiratory failure with hypercapnia. Likely multifactorial related to CHF and COPD. Patient was put on BiPAP in the emergency room and did not appear to be tolerating this. Her anxiety is playing a large role in her intolerance. She was prescribed dose of Ativan and we will try to resume BiPAP. 2. Acute on chronic combined systolic and diastolic congestive heart failure. Last echocardiogram shows ejection fraction of 30-35%. So the patient on intravenous Lasix. She is already on beta blocker and aspirin. Will not use any ACE inhibitor due to chronic kidney disease. She is on Imdur. Cycle cardiac markers 3. COPD exacerbation. Continue nebulizer treatments and intravenous steroids. 4. CKD stage III. Creatinine appears to be near baseline. Continue to monitor in the setting of diuresis. 5. Anemia. No evidence of bleeding, appears to be a chronic issue and hemoglobin is at baseline. 6. Hyperlipidemia. Continue statin   Code Status: DNR. Confirmed with family at the bedside DVT Prophylaxis: heparin Family Communication: discussed with family at the bedside Disposition Plan: admit to step down unit  Time spent: 75mins  Zadie Deemer [facility] Pager [phone]

## 2014-09-23 NOTE — ED Notes (Addendum)
Pt to department via EMS.  Reports she woke up feeling SOB.  EMS gave albuterol and a duoneb and 125 mg Solumedrol prior to arrival.  Pt on oxygen 2 LPM continual at home.  Pt has audible wheezing on arrival.

## 2014-09-23 NOTE — ED Notes (Signed)
Report given to [place], RN

## 2014-09-23 NOTE — ED Notes (Signed)
Patient anxious pulling at bi-pp. Resp in room. Dr Roderic Palau notified of patient's anxiety-order to be given. Dr Roderic Palau to room to assess patient.

## 2014-09-23 NOTE — ED Notes (Signed)
CRITICAL VALUE ALERT  Critical value received:  Venous blood gas  Date of notification:  09/23/2014  Time of notification: 0614  Critical value read back: Yes  MD notified Dr. Tomi Bamberger aware

## 2014-09-23 NOTE — ED Provider Notes (Signed)
CSN: [id number]     Arrival date & time 09/23/14  1601 History   First MD Initiated Contact with Patient 09/23/14 [phone]     Chief Complaint  Patient presents with  . Shortness of Breath   Level V caveat for respiratory distress  (Consider location/radiation/quality/duration/timing/severity/associated sxs/prior Treatment) HPI  Patient presents via EMS after having acute shortness of breath that started 1-2 hours prior to arrival. She states she's coughing and coughing up white sputum. She states she used her nebulizer without improvement. She states she has run out of her inhaler. EMS gave her a nebulizer treatment in route. They also gave her Solu-Medrol. Patient's pulse ox when she arrived to the ED was 87%. She was started on another nebulizer in the ED and her pulse ox improved to 90% on oxygen.  PCP Dr Luan Pulling  Past Medical History  Diagnosis Date  . COPD (chronic obstructive pulmonary disease)   . Tobacco abuse   . Hypertension   . History of atrial septal defect     Closure in 50's   . Mental retardation, idiopathic mild   . Arthritis   . NSTEMI (non-ST elevated myocardial infarction)   . PAF (paroxysmal atrial fibrillation) 04/2013    a. s/p prior TEE/DCCV. b. Eliquis stopped after GIB/anemia in 03/2014.  Marland Kitchen GERD (gastroesophageal reflux disease)   . Atrophy of right kidney   . Chronic systolic CHF (congestive heart failure)   . Diverticulosis   . Hiatal hernia   . Barrett esophagus   . Ischemic cardiomyopathy     a. 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  Marland Kitchen CAD (coronary artery disease) 04/2013    a. cath 04/2013 showed RCA, LCx occlusion, 50% mid-LAD stenosis, 80% distal stenosis, and first diagonal branch w/ 75% ostial stenosis. previously been evaluated for CABG, however d/t renal insufficiency and mild MR, the decision was previously made to manage her medically.  . Anemia   . GI bleed     a.  GIB/symptomatic anemia Hgb 6 in 03/2014 (colonoscopy -  small bowel AVMs/erosions).  . Rat bite     a. Tx with rabies vaccines.  . Hyperkalemia   . CKD (chronic kidney disease), stage III   . Chronic respiratory failure    Past Surgical History  Procedure Laterality Date  . Asd repair      in her 22s  . Total hip arthroplasty Right     in her 70s  . Total abdominal hysterectomy      in the 46s  . Tee without cardioversion N/A 05/08/2013    Procedure: TRANSESOPHAGEAL ECHOCARDIOGRAM (TEE);  Surgeon: Lelon Perla, MD;  Location: [facility] ENDOSCOPY;  Service: Cardiovascular;  Laterality: N/A;  . Cardioversion N/A 05/08/2013    Procedure: CARDIOVERSION;  Surgeon: Lelon Perla, MD;  Location: [facility] ENDOSCOPY;  Service: Cardiovascular;  Laterality: N/A;  Dr. Zoila Shutter verified with EP pt in a flutter...will cardiovert...  150 joules @ 4:40, using Propofol 40  mg/IV .Marland Kitchen.successful NSR   . Tonsillectomy    . Colonoscopy N/A 10/24/2013    Dr. Gala Romney: 3 polyps, one removed piecemeal (tubulovillous adenoma). Next TCS 04/2014  . Esophagogastroduodenoscopy N/A 10/24/2013    Dr. Rourk:Barrett's without dysplasia. Next EGD 10/2014  . Cardiac catheterization  04/2013    Left mainstem: LM calcified with long 30% stenosis. Left anterior descending (LAD):  The LAD is large and wraps the apex.  The mid vessel has 50% stenosis followed by a focal 80% stenosis.  D1 small with ostial 75% stenosis.Left anterior descending (LAD):  The LAD is large and wraps the apex.  The mid vessel has 50% stenosis followed by a focal 80% stenosis.  D1 small with ostial 75% stenosis.     . Agile capsule N/A 04/05/2014    Procedure: AGILE CAPSULE;  Surgeon: Danie Binder, MD;  Location: [facility] ENDO SUITE;  Service: Endoscopy;  Laterality: N/A;  . Colonoscopy N/A 04/10/2014    Procedure: COLONOSCOPY;  Surgeon: Daneil Dolin, MD;  Location: [facility] ENDO SUITE;  Service: Endoscopy;  Laterality: N/A;  . Givens capsule study N/A 04/17/2014    Procedure: GIVENS CAPSULE STUDY;  Surgeon: Daneil Dolin,  MD;  Location: [facility] ENDO SUITE;  Service: Endoscopy;  Laterality: N/A;  . Left and right heart catheterization with coronary angiogram N/A 05/04/2013    Procedure: LEFT AND RIGHT HEART CATHETERIZATION WITH CORONARY ANGIOGRAM;  Surgeon: Minus Breeding, MD;  Location: [facility] CATH LAB;  Service: Cardiovascular;  Laterality: N/A;   Family History  Problem Relation Age of Onset  . Heart attack Brother   . Heart attack Brother   . Diabetes Mother   . Heart attack Father     Died at 100 from MI  . Heart attack Sister     stent at 70  . Heart attack Sister     stent at 24  . Cancer Brother     lung  . Cancer Brother     lung   . Colon cancer Brother     deceased, diagnosed in early 54s   History  Substance Use Topics  . Smoking status: Current Every Day Smoker -- 1.00 packs/day for 55 years    Types: Cigarettes    Start date: 02/08/1958  . Smokeless tobacco: Never Used     Comment: stopped Dec 2014  . Alcohol Use: No   On oxygen at home ? 2 lpm [place] Lives with her sister Still smokes 1/2 ppd  OB History    No data available     Review of Systems  Unable to perform ROS: Severe respiratory distress      Allergies  Review of patient's allergies indicates no known allergies.  Home Medications   Prior to Admission medications   Medication Sig Start Date End Date Taking? Authorizing Provider  acetaminophen (TYLENOL) 325 MG tablet Take 2 tablets (650 mg total) by mouth every 6 (six) hours as needed for mild pain or fever. 05/11/13   Modena Jansky, MD  albuterol (PROVENTIL HFA;VENTOLIN HFA) 108 (90 BASE) MCG/ACT inhaler Inhale 2 puffs into the lungs every 6 (six) hours as needed for wheezing or shortness of breath.    Historical Provider, MD  albuterol (PROVENTIL) (2.5 MG/3ML) 0.083% nebulizer solution Take 3 mLs (2.5 mg total) by nebulization every 2 (two) hours as needed for wheezing or shortness of breath. 04/10/14   Sinda Du, MD  amLODipine (NORVASC) 2.5 MG tablet Take 1  tablet (2.5 mg total) by mouth daily. 07/18/14   Dayna N Dunn, PA-C  aspirin EC 81 MG tablet Take 1 tablet (81 mg total) by mouth daily. 07/18/14   Dayna N Dunn, PA-C  atorvastatin (LIPITOR) 80 MG tablet Take 80 mg by mouth daily.    Historical Provider, MD  carvedilol (COREG) 6.25 MG tablet Take 1 tablet (6.25 mg total) by mouth 2 (two) times daily with a meal. 07/18/14   Dayna N Dunn, PA-C  cholecalciferol (VITAMIN D) 1000 UNITS tablet Take 2,000 Units by mouth daily.  Historical Provider, MD  furosemide (LASIX) 20 MG tablet Take 20 mg by mouth daily.    Historical Provider, MD  ipratropium-albuterol (DUONEB) 0.5-2.5 (3) MG/3ML SOLN Take 3 mLs by nebulization every 6 (six) hours as needed (SHORTNESS OF BREATH).    Historical Provider, MD  isosorbide mononitrate (IMDUR) 30 MG 24 hr tablet Take 1 tablet (30 mg total) by mouth daily. 07/18/14   Dayna N Dunn, PA-C  pantoprazole (PROTONIX) 40 MG tablet Take 40 mg by mouth daily.    Historical Provider, MD   BP 159/87 mmHg  Pulse 72  Temp(Src) 98.5 F (36.9 C) (Oral)  Resp 24  Ht 5\' 5"  (1.651 m)  Wt 97 lb (43.999 kg)  BMI 16.14 kg/m2  SpO2 87%  Vital signs normal except for hypoxia  Physical Exam  Constitutional: She is oriented to person, place, and time.  Non-toxic appearance. She does not appear ill. No distress.  Thin frail elderly female  HENT:  Head: Normocephalic and atraumatic.  Right Ear: External ear normal.  Left Ear: External ear normal.  Nose: Nose normal. No mucosal edema or rhinorrhea.  Mouth/Throat: Oropharynx is clear and moist and mucous membranes are normal. No dental abscesses or uvula swelling.  Eyes: Conjunctivae and EOM are normal. Pupils are equal, round, and reactive to light.  Neck: Normal range of motion and full passive range of motion without pain. Neck supple.  Cardiovascular: Normal rate, regular rhythm and normal heart sounds.  Exam reveals no gallop and no friction rub.   No murmur heard. Pulmonary/Chest:  Effort normal. No respiratory distress. She has wheezes. She has no rhonchi. She has no rales. She exhibits no tenderness and no crepitus.  Patient has diffuse inspiratory and expiratory wheezing  Abdominal: Soft. Normal appearance and bowel sounds are normal. She exhibits no distension. There is no tenderness. There is no rebound and no guarding.  Musculoskeletal: Normal range of motion. She exhibits no edema or tenderness.  Moves all extremities well.   Neurological: She is alert and oriented to person, place, and time. She has normal strength. No cranial nerve deficit.  Skin: Skin is warm, dry and intact. No rash noted. No erythema. No pallor.  Psychiatric: She has a normal mood and affect. Her speech is normal and behavior is normal. Her mood appears not anxious.  Nursing note and vitals reviewed.   ED Course  Procedures (including critical care time)  Medications  ipratropium (ATROVENT) 0.02 % nebulizer solution (0.5 mg  Given 09/23/14 0527)  albuterol (PROVENTIL) (2.5 MG/3ML) 0.083% nebulizer solution (5 mg  Given 09/23/14 0526)  furosemide (LASIX) injection 60 mg (60 mg Intravenous Given 09/23/14 0557)  nitroGLYCERIN (NITROGLYN) 2 % ointment 0.5 inch (0.5 inches Topical Given 09/23/14 0557)  albuterol (PROVENTIL) (2.5 MG/3ML) 0.083% nebulizer solution 5 mg (5 mg Nebulization Given 09/23/14 0656)  ipratropium (ATROVENT) nebulizer solution 0.5 mg (0.5 mg Nebulization Given 09/23/14 0657)    Recheck 06:00 pt has pulse ox of 100 % on NRM. Review of her CXR c/w CHF, lasix and NTG paste ordered.  Pt has improved air movement and some end expiratory wheezing still.   After reviewing patient's VBG which shows acute respiratory acidosis she was placed on BiPAP. I gave patient and her family her test results and need for admission and they are agreeable.   07:22 Dr Roderic Palau, admit to Step down, attending Dr Durwin Glaze Review Results for orders placed or performed during the hospital encounter of  09/23/14  Comprehensive metabolic panel  Result Value Ref Range   Sodium 141 135 - 145 mmol/L   Potassium 4.1 3.5 - 5.1 mmol/L   Chloride 110 101 - 111 mmol/L   CO2 24 22 - 32 mmol/L   Glucose, Bld 156 (H) 70 - 99 mg/dL   BUN 19 6 - 20 mg/dL   Creatinine, Ser 1.56 (H) 0.44 - 1.00 mg/dL   Calcium 8.7 (L) 8.9 - 10.3 mg/dL   Total Protein 6.5 6.5 - 8.1 g/dL   Albumin 3.6 3.5 - 5.0 g/dL   AST 40 15 - 41 U/L   ALT 23 14 - 54 U/L   Alkaline Phosphatase 154 (H) 38 - 126 U/L   Total Bilirubin 0.5 0.3 - 1.2 mg/dL   GFR calc non Af Amer 33 (L) >60 mL/min   GFR calc Af Amer 38 (L) >60 mL/min   Anion gap 7 5 - 15  CBC with Differential  Result Value Ref Range   WBC 14.2 (H) 4.0 - 10.5 K/uL   RBC 3.96 3.87 - 5.11 MIL/uL   Hemoglobin 10.8 (L) 12.0 - 15.0 g/dL   HCT 35.8 (L) 36.0 - 46.0 %   MCV 90.4 78.0 - 100.0 fL   MCH 27.3 26.0 - 34.0 pg   MCHC 30.2 30.0 - 36.0 g/dL   RDW 16.3 (H) 11.5 - 15.5 %   Platelets 264 150 - 400 K/uL   Neutrophils Relative % 59 43 - 77 %   Neutro Abs 8.4 (H) 1.7 - 7.7 K/uL   Lymphocytes Relative 32 12 - 46 %   Lymphs Abs 4.5 (H) 0.7 - 4.0 K/uL   Monocytes Relative 6 3 - 12 %   Monocytes Absolute 0.9 0.1 - 1.0 K/uL   Eosinophils Relative 3 0 - 5 %   Eosinophils Absolute 0.4 0.0 - 0.7 K/uL   Basophils Relative 0 0 - 1 %   Basophils Absolute 0.1 0.0 - 0.1 K/uL  Troponin I  Result Value Ref Range   Troponin I <0.03 <0.031 ng/mL  Brain natriuretic peptide  Result Value Ref Range   B Natriuretic Peptide 1064.0 (H) 0.0 - 100.0 pg/mL  Blood gas, venous  Result Value Ref Range   FIO2 100.00 %   Delivery systems NON-REBREATHER OXYGEN MASK    pH, Ven 7.204 (L) 7.250 - 7.300   pCO2, Ven 66.8 (H) 45.0 - 50.0 mmHg   pO2, Ven 49.0 (H) 30.0 - 45.0 mmHg   Bicarbonate 25.4 (H) 20.0 - 24.0 mEq/L   TCO2 24.6 0 - 100 mmol/L   Acid-base deficit 1.7 0.0 - 2.0 mmol/L   O2 Saturation 74.1 %   Patient temperature 37.0    Collection site VEIN    Drawn by [id number]     Sample type VEIN    Laboratory interpretation all normal except renal insufficiency that stable, leukocytosis, stable anemia, elevated BNP, ABG shows acute respiratory acidosis     Imaging Review Dg Chest Portable 1 View  09/23/2014   CLINICAL DATA:  Sudden onset shortness of breath. History of COPD. CAD. CHF.  EXAM: PORTABLE CHEST - 1 VIEW  COMPARISON:  07/14/2014  FINDINGS: Postoperative changes in the mediastinum. Cardiac enlargement, increasing since previous study. Increased pulmonary vascular congestion. Interstitial changes suggesting interstitial edema. No blunting of costophrenic angles. No pneumothorax. Mediastinal contours appear intact.  IMPRESSION: Development of congestive changes with cardiac enlargement, pulmonary vascular congestion, and interstitial edema.   Electronically Signed   By: Lucienne Capers M.D.   On: 09/23/2014 05:46  EKG Interpretation   Date/Time:  Monday Sep 23 2014 05:54:09 EDT Ventricular Rate:  71 PR Interval:  153 QRS Duration: 125 QT Interval:  454 QTC Calculation: 493 R Axis:   98 Text Interpretation:  Unknown rhythm, irregular rate RBBB and LPFB  Nonspecific T abnormalities, lateral leads No significant change since  last tracing  16 Sep 2014 Confirmed by Malyah Ohlrich  MD-I, Peytyn Trine ([id number]) on  09/23/2014 6:01:32 AM      MDM   Final diagnoses:  Hypoxia  Shortness of breath  Acute combined systolic and diastolic congestive heart failure  COPD exacerbation  Renal insufficiency  Anemia, unspecified anemia type    Disposition  Admission   Rolland Porter, MD, [place] Performed by: Rolland Porter L Total critical care time: 39 min Critical care time was exclusive of separately billable procedures and treating other patients. Critical care was necessary to treat or prevent imminent or life-threatening deterioration. Critical care was time spent personally by me on the following activities: development of treatment plan with patient and/or  surrogate as well as nursing, discussions with consultants, evaluation of patient's response to treatment, examination of patient, obtaining history from patient or surrogate, ordering and performing treatments and interventions, ordering and review of laboratory studies, ordering and review of radiographic studies, pulse oximetry and re-evaluation of patient's condition.     Rolland Porter, MD 09/23/14 [id number]

## 2014-09-23 NOTE — Progress Notes (Signed)
fio2 decreased back to 50%.

## 2014-09-24 LAB — BASIC METABOLIC PANEL
Anion gap: 11 (ref 5–15)
BUN: 30 mg/dL — AB (ref 6–20)
CHLORIDE: 104 mmol/L (ref 101–111)
CO2: 28 mmol/L (ref 22–32)
Calcium: 9 mg/dL (ref 8.9–10.3)
Creatinine, Ser: 1.57 mg/dL — ABNORMAL HIGH (ref 0.44–1.00)
GFR calc non Af Amer: 33 mL/min — ABNORMAL LOW (ref >60)
GFR, EST AFRICAN AMERICAN: 38 mL/min — AB (ref >60)
GLUCOSE: 159 mg/dL — AB (ref 70–99)
Potassium: 3.7 mmol/L (ref 3.5–5.1)
Sodium: 143 mmol/L (ref 135–145)

## 2014-09-24 MED ORDER — POTASSIUM CHLORIDE CRYS ER 20 MEQ PO TBCR
20.0000 meq | EXTENDED_RELEASE_TABLET | Freq: Two times a day (BID) | ORAL | Status: DC
  Administered 2014-09-24 – 2014-09-26 (×5): 20 meq via ORAL
  Filled 2014-09-24 (×5): qty 1

## 2014-09-24 NOTE — Progress Notes (Signed)
Subjective: She was admitted yesterday with respiratory failure which is multi-factorial. This is acute on chronic. She has significant issues with COPD and seems to be having an exacerbation and she has acute on chronic combined systolic and diastolic heart failure. She is improved. She's been on BiPAP through the day. She has diuresed 3300 since admission. She says she's hungry.  Objective: Vital signs in last 24 hours: Temp:  [96.8 F (36 C)-98.2 F (36.8 C)] 97.7 F (36.5 C) (05/10 0400) Pulse Rate:  [66-118] 66 (05/10 0749) Resp:  [14-29] 19 (05/10 0749) BP: (71-146)/(50-76) 111/56 mmHg (05/10 0600) SpO2:  [89 %-100 %] 95 % (05/10 0749) FiO2 (%):  [40 %-60 %] 40 % (05/10 0707) Weight:  [44.5 kg (98 lb 1.7 oz)] 44.5 kg (98 lb 1.7 oz) (05/10 0500) Weight change: 0.501 kg (1 lb 1.7 oz)    Intake/Output from previous day: 05/09 0701 - 05/10 0700 In: -  Out: 3350 [Urine:3350]  PHYSICAL EXAM General appearance: alert, cooperative, mild distress and On BiPAP Resp: rhonchi bilaterally Cardio: regular rate and rhythm, S1, S2 normal, no murmur, click, rub or gallop GI: soft, non-tender; bowel sounds normal; no masses,  no organomegaly Extremities: extremities normal, atraumatic, no cyanosis or edema  Lab Results:  Results for orders placed or performed during the hospital encounter of 09/23/14 (from the past 48 hour(s))  Blood gas, venous     Status: Abnormal   Collection Time: 09/23/14  6:00 AM  Result Value Ref Range   FIO2 100.00 %   Delivery systems NON-REBREATHER OXYGEN MASK    pH, Ven 7.204 (L) 7.250 - 7.300    Comment: CRITICAL RESULT CALLED TO, READ BACK BY AND VERIFIED WITH:  TO Ellison Carwin RN AT 3382 09/23/2014 BY LANC RRT    pCO2, Ven 66.8 (H) 45.0 - 50.0 mmHg    Comment: CRITICAL RESULT CALLED TO, READ BACK BY AND VERIFIED WITH:  TO Ellison Carwin RN AT 5053 09/23/2014 BY LANC RRT    pO2, Ven 49.0 (H) 30.0 - 45.0 mmHg   Bicarbonate 25.4 (H) 20.0 - 24.0 mEq/L   TCO2 24.6 0  - 100 mmol/L   Acid-base deficit 1.7 0.0 - 2.0 mmol/L   O2 Saturation 74.1 %   Patient temperature 37.0    Collection site VEIN    Drawn by [id number]    Sample type VEIN   Comprehensive metabolic panel     Status: Abnormal   Collection Time: 09/23/14  6:09 AM  Result Value Ref Range   Sodium 141 135 - 145 mmol/L   Potassium 4.1 3.5 - 5.1 mmol/L   Chloride 110 101 - 111 mmol/L   CO2 24 22 - 32 mmol/L   Glucose, Bld 156 (H) 70 - 99 mg/dL   BUN 19 6 - 20 mg/dL   Creatinine, Ser 1.56 (H) 0.44 - 1.00 mg/dL   Calcium 8.7 (L) 8.9 - 10.3 mg/dL   Total Protein 6.5 6.5 - 8.1 g/dL   Albumin 3.6 3.5 - 5.0 g/dL   AST 40 15 - 41 U/L   ALT 23 14 - 54 U/L   Alkaline Phosphatase 154 (H) 38 - 126 U/L   Total Bilirubin 0.5 0.3 - 1.2 mg/dL   GFR calc non Af Amer 33 (L) >60 mL/min   GFR calc Af Amer 38 (L) >60 mL/min    Comment: (NOTE) The eGFR has been calculated using the CKD EPI equation. This calculation has not been validated in all clinical situations. eGFR's persistently <60  mL/min signify possible Chronic Kidney Disease.    Anion gap 7 5 - 15  CBC with Differential     Status: Abnormal   Collection Time: 09/23/14  6:09 AM  Result Value Ref Range   WBC 14.2 (H) 4.0 - 10.5 K/uL   RBC 3.96 3.87 - 5.11 MIL/uL   Hemoglobin 10.8 (L) 12.0 - 15.0 g/dL   HCT 35.8 (L) 36.0 - 46.0 %   MCV 90.4 78.0 - 100.0 fL   MCH 27.3 26.0 - 34.0 pg   MCHC 30.2 30.0 - 36.0 g/dL   RDW 16.3 (H) 11.5 - 15.5 %   Platelets 264 150 - 400 K/uL   Neutrophils Relative % 59 43 - 77 %   Neutro Abs 8.4 (H) 1.7 - 7.7 K/uL   Lymphocytes Relative 32 12 - 46 %   Lymphs Abs 4.5 (H) 0.7 - 4.0 K/uL   Monocytes Relative 6 3 - 12 %   Monocytes Absolute 0.9 0.1 - 1.0 K/uL   Eosinophils Relative 3 0 - 5 %   Eosinophils Absolute 0.4 0.0 - 0.7 K/uL   Basophils Relative 0 0 - 1 %   Basophils Absolute 0.1 0.0 - 0.1 K/uL  Troponin I     Status: None   Collection Time: 09/23/14  6:09 AM  Result Value Ref Range   Troponin I <0.03  <0.031 ng/mL    Comment:        NO INDICATION OF MYOCARDIAL INJURY.   Brain natriuretic peptide     Status: Abnormal   Collection Time: 09/23/14  6:09 AM  Result Value Ref Range   B Natriuretic Peptide 1064.0 (H) 0.0 - 100.0 pg/mL  Troponin I     Status: None   Collection Time: 09/23/14  9:14 AM  Result Value Ref Range   Troponin I <0.03 <0.031 ng/mL    Comment:        NO INDICATION OF MYOCARDIAL INJURY.   CBC     Status: Abnormal   Collection Time: 09/23/14  9:14 AM  Result Value Ref Range   WBC 9.7 4.0 - 10.5 K/uL   RBC 4.19 3.87 - 5.11 MIL/uL   Hemoglobin 11.2 (L) 12.0 - 15.0 g/dL   HCT 37.9 36.0 - 46.0 %   MCV 90.5 78.0 - 100.0 fL   MCH 26.7 26.0 - 34.0 pg   MCHC 29.6 (L) 30.0 - 36.0 g/dL   RDW 16.2 (H) 11.5 - 15.5 %   Platelets 234 150 - 400 K/uL  Creatinine, serum     Status: Abnormal   Collection Time: 09/23/14  9:14 AM  Result Value Ref Range   Creatinine, Ser 1.60 (H) 0.44 - 1.00 mg/dL   GFR calc non Af Amer 32 (L) >60 mL/min   GFR calc Af Amer 37 (L) >60 mL/min    Comment: (NOTE) The eGFR has been calculated using the CKD EPI equation. This calculation has not been validated in all clinical situations. eGFR's persistently <60 mL/min signify possible Chronic Kidney Disease.   MRSA PCR Screening     Status: None   Collection Time: 09/23/14  1:20 PM  Result Value Ref Range   MRSA by PCR NEGATIVE NEGATIVE    Comment:        The GeneXpert MRSA Assay (FDA approved for NASAL specimens only), is one component of a comprehensive MRSA colonization surveillance program. It is not intended to diagnose MRSA infection nor to guide or monitor treatment for MRSA infections.   Troponin I  Status: None   Collection Time: 09/23/14  2:36 PM  Result Value Ref Range   Troponin I <0.03 <0.031 ng/mL    Comment:        NO INDICATION OF MYOCARDIAL INJURY.   Troponin I     Status: None   Collection Time: 09/23/14  7:59 PM  Result Value Ref Range   Troponin I <0.03  <0.031 ng/mL    Comment:        NO INDICATION OF MYOCARDIAL INJURY.   Basic metabolic panel     Status: Abnormal   Collection Time: 09/24/14  6:03 AM  Result Value Ref Range   Sodium 143 135 - 145 mmol/L   Potassium 3.7 3.5 - 5.1 mmol/L   Chloride 104 101 - 111 mmol/L   CO2 28 22 - 32 mmol/L   Glucose, Bld 159 (H) 70 - 99 mg/dL   BUN 30 (H) 6 - 20 mg/dL   Creatinine, Ser 1.57 (H) 0.44 - 1.00 mg/dL   Calcium 9.0 8.9 - 10.3 mg/dL   GFR calc non Af Amer 33 (L) >60 mL/min   GFR calc Af Amer 38 (L) >60 mL/min    Comment: (NOTE) The eGFR has been calculated using the CKD EPI equation. This calculation has not been validated in all clinical situations. eGFR's persistently <60 mL/min signify possible Chronic Kidney Disease.    Anion gap 11 5 - 15    ABGS  Recent Labs  09/23/14 0600  TCO2 24.6  HCO3 25.4*   CULTURES Recent Results (from the past 240 hour(s))  MRSA PCR Screening     Status: None   Collection Time: 09/23/14  1:20 PM  Result Value Ref Range Status   MRSA by PCR NEGATIVE NEGATIVE Final    Comment:        The GeneXpert MRSA Assay (FDA approved for NASAL specimens only), is one component of a comprehensive MRSA colonization surveillance program. It is not intended to diagnose MRSA infection nor to guide or monitor treatment for MRSA infections.    Studies/Results: Dg Chest Portable 1 View  09/23/2014   CLINICAL DATA:  Sudden onset shortness of breath. History of COPD. CAD. CHF.  EXAM: PORTABLE CHEST - 1 VIEW  COMPARISON:  07/14/2014  FINDINGS: Postoperative changes in the mediastinum. Cardiac enlargement, increasing since previous study. Increased pulmonary vascular congestion. Interstitial changes suggesting interstitial edema. No blunting of costophrenic angles. No pneumothorax. Mediastinal contours appear intact.  IMPRESSION: Development of congestive changes with cardiac enlargement, pulmonary vascular congestion, and interstitial edema.   Electronically  Signed   By: Lucienne Capers M.D.   On: 09/23/2014 05:46    Medications:  Prior to Admission:  Prescriptions prior to admission  Medication Sig Dispense Refill Last Dose  . acetaminophen (TYLENOL) 325 MG tablet Take 2 tablets (650 mg total) by mouth every 6 (six) hours as needed for mild pain or fever.   unknown  . albuterol (PROVENTIL HFA;VENTOLIN HFA) 108 (90 BASE) MCG/ACT inhaler Inhale 2 puffs into the lungs every 6 (six) hours as needed for wheezing or shortness of breath.   unknown  . albuterol (PROVENTIL) (2.5 MG/3ML) 0.083% nebulizer solution Take 3 mLs (2.5 mg total) by nebulization every 2 (two) hours as needed for wheezing or shortness of breath. 75 mL 12 unknown  . ALPRAZolam (XANAX) 0.25 MG tablet Take 0.25 mg by mouth 3 (three) times daily as needed for anxiety.    unknown  . amLODipine (NORVASC) 2.5 MG tablet Take 1 tablet (2.5 mg  total) by mouth daily. 30 tablet 6 unknown  . aspirin EC 81 MG tablet Take 1 tablet (81 mg total) by mouth daily. 30 tablet 6 unknown  . atorvastatin (LIPITOR) 80 MG tablet Take 80 mg by mouth daily.   unknown  . carvedilol (COREG) 6.25 MG tablet Take 1 tablet (6.25 mg total) by mouth 2 (two) times daily with a meal. 60 tablet 6 unknown  . cholecalciferol (VITAMIN D) 1000 UNITS tablet Take 2,000 Units by mouth daily.   unknown  . furosemide (LASIX) 20 MG tablet Take 20 mg by mouth daily.   unknown  . HYDROcodone-acetaminophen (NORCO/VICODIN) 5-325 MG per tablet Take 1 tablet by mouth 2 (two) times daily as needed for moderate pain.    unknown  . ipratropium-albuterol (DUONEB) 0.5-2.5 (3) MG/3ML SOLN Take 3 mLs by nebulization every 6 (six) hours as needed (SHORTNESS OF BREATH).   unknown  . isosorbide mononitrate (IMDUR) 30 MG 24 hr tablet Take 1 tablet (30 mg total) by mouth daily. 30 tablet 6 unknown  . NITROSTAT 0.4 MG SL tablet Take 1 tablet by mouth daily as needed for chest pain.    unknown  . pantoprazole (PROTONIX) 40 MG tablet Take 40 mg by  mouth daily.   unknown at Unknown time   Scheduled: . amLODipine  2.5 mg Oral Daily  . antiseptic oral rinse  7 mL Mouth Rinse q12n4p  . aspirin EC  81 mg Oral Daily  . atorvastatin  80 mg Oral Daily  . carvedilol  6.25 mg Oral BID WC  . chlorhexidine  15 mL Mouth Rinse BID  . furosemide  40 mg Intravenous BID  . heparin  5,000 Units Subcutaneous 3 times per day  . ipratropium-albuterol  3 mL Nebulization Q4H  . isosorbide mononitrate  30 mg Oral Daily  . methylPREDNISolone (SOLU-MEDROL) injection  60 mg Intravenous Q6H  . pantoprazole  40 mg Oral Daily  . sodium chloride  3 mL Intravenous Q12H   Continuous:  EPP:IRJJOA chloride, acetaminophen, ALPRAZolam, morphine injection, ondansetron (ZOFRAN) IV, sodium chloride  Assesment: She was admitted with acute on chronic respiratory failure which is multifactorial. She has components of COPD and of CHF. She has improved. She has diuresed over 3 L. She has been receiving nebulizer treatments and steroids. She's been on BiPAP. Active Problems:   COPD (chronic obstructive pulmonary disease)   Hypertension   Mental retardation, idiopathic mild   CKD (chronic kidney disease), stage III   Hyperlipidemia   Anemia   Acute on chronic respiratory failure with hypercapnia   Acute on chronic combined systolic and diastolic CHF (congestive heart failure)   Acute on chronic respiratory failure    Plan: See if it can get her off the BiPAP today. I think she can eat if were able to do that. Continue with all of her other treatments.    LOS: 1 day   Suttyn Cryder L 09/24/2014, 7:49 AM

## 2014-09-24 NOTE — Progress Notes (Signed)
Pharmacist Heart Failure Core Measure Documentation  Assessment: Ader Fritze has an EF documented as 30-35% on 07/15/14  Rationale: Heart failure patients with left ventricular systolic dysfunction (LVSD) and an EF < 40% should be prescribed an angiotensin converting enzyme inhibitor (ACEI) or angiotensin receptor blocker (ARB) at discharge unless a contraindication is documented in the medical record.  This patient is not currently on an ACEI or ARB for HF.  This note is being placed in the record in order to provide documentation that a contraindication to the use of these agents is present for this encounter.  ACE Inhibitor or Angiotensin Receptor Blocker is contraindicated (specify all that apply)  []   ACEI allergy AND ARB allergy []   Angioedema []   Moderate or severe aortic stenosis []   Hyperkalemia []   Hypotension []   Renal artery stenosis [x]   Worsening renal function, preexisting renal disease or dysfunction   Hart Robinsons A 09/24/2014 4:18 PM

## 2014-09-24 NOTE — Care Management Note (Signed)
Case Management Note  Patient Details  Name: Alexis Dillon MRN: [id number] Date of Birth: 26-Feb-1946  Expected Discharge Date:                  Expected Discharge Plan:  [place]  In-House Referral:  Clinical Social Work  Discharge planning Services  CM Consult  Post Acute Care Choice:  Home Health Choice offered to:  Patient  DME Arranged:    DME Agency:     HH Arranged:  PT [place]:  [place]  Status of Service:  In process, will continue to follow  Medicare Important Message Given:    Date Medicare IM Given:    Medicare IM give by:    Date Additional Medicare IM Given:    Additional Medicare Important Message give by:     If discussed at [place] of Stay Meetings, dates discussed:    Additional Comments: Pt is from home, lives with sister. Pt has home O2 and neb machine [facility]. Pt has has [facility] in the past for [facility] services. Pt has been to [facility] in the past and is requesting to go there again. PT has recommended HH PT. CSW spoke with pt about going to ALF and pt has refused because she does not want to look part/all of her check each month. CSW has spoken with pt's sister and it is okay for pt to return to sisters home. Pt agreeable to [facility] services through [facility]. [facility] made aware of referral and will obtain pt info from chart. Will cont to follow for CM needs.   Sherald Barge, RN 09/24/2014, 2:47 PM

## 2014-09-24 NOTE — Evaluation (Signed)
Physical Therapy Evaluation Patient Details Name: Alexis Dillon MRN: [id number] DOB: November 02, 1945 Today's Date: 09/24/2014   History of Present Illness  Pt is a 69 year old female, mentally challenged, who was admitted with CHF and COPD exacerbation.  She lives with her sister but is alone during the day while sister is at work.  She ambulates with a walker.  Clinical Impression   Pt was seen for evaluation.  She reported feeling well, was very pleasant and cooperative.  She was on 2 L O2 with no significant dyspnea at rest or with exertion.  Pt was found to have fairly good functional mobility.  She needed no assist to transfer out of bed and was able to ambulate 200' with a walker and good stability.  I think that she feels isolated at home when she is alone.  Her sister in law reports that she cries from loneliness at time.  She would be very well suited to ACLF at d/c.  Otherwise, she should have full time assist at home due to her cognitive problems.    Follow Up Recommendations No PT follow up (if family feels that it is indicated to ensure safety at home, HHPT could be done)    Equipment Recommendations  None recommended by PT    Recommendations for Other Services   none    Precautions / Restrictions Precautions Precautions: Fall Restrictions Weight Bearing Restrictions: No      Mobility  Bed Mobility Overal bed mobility: Modified Independent                Transfers Overall transfer level: Needs assistance Equipment used: Rolling walker (2 wheeled) Transfers: Sit to/from Stand Sit to Stand: Supervision            Ambulation/Gait Ambulation/Gait assistance: Supervision Ambulation Distance (Feet): 200 Feet Assistive device: Rolling walker (2 wheeled) Gait Pattern/deviations: WFL(Within Functional Limits)   Gait velocity interpretation: at or above normal speed for age/gender    Stairs            Wheelchair Mobility    Modified Rankin (Stroke  Patients Only)       Balance Overall balance assessment: No apparent balance deficits (not formally assessed)                                           Pertinent Vitals/Pain Pain Assessment: No/denies pain    Home Living Family/patient expects to be discharged to:: Unsure Living Arrangements: Other relatives               Additional Comments: pt would be well suited for ACLF or full time assist at home...she is functioning at too high a level for SNF    Prior Function Level of Independence: Needs assistance   Gait / Transfers Assistance Needed: ambulates with walker independently  ADL's / Homemaking Assistance Needed: assist with bathing and meals        Hand Dominance   Dominant Hand: Right    Extremity/Trunk Assessment   Upper Extremity Assessment: Overall WFL for tasks assessed           Lower Extremity Assessment: Overall WFL for tasks assessed         Communication   Communication: No difficulties  Cognition Arousal/Alertness: Awake/alert Behavior During Therapy: WFL for tasks assessed/performed Overall Cognitive Status: History of cognitive impairments - at baseline  General Comments      Exercises        Assessment/Plan    PT Assessment Patent does not need any further PT services  PT Diagnosis     PT Problem List    PT Treatment Interventions     PT Goals (Current goals can be found in the Care Plan section) Acute Rehab PT Goals PT Goal Formulation: All assessment and education complete, DC therapy    Frequency     Barriers to discharge        Co-evaluation               End of Session Equipment Utilized During Treatment: Gait belt;Oxygen Activity Tolerance: Patient tolerated treatment well Patient left: in chair;with call bell/phone within reach;with chair alarm set Nurse Communication: Mobility status         Time: 1115-1140 PT Time Calculation (min) (ACUTE  ONLY): 25 min   Charges:   PT Evaluation $Initial PT Evaluation Tier I: 1 Procedure     PT G CodesDemetrios Isaacs L 09/24/2014, 11:50 AM

## 2014-09-24 NOTE — Clinical Social Work Note (Signed)
Clinical Social Work Assessment  Patient Details  Name: Alexis Dillon MRN: [id number] Date of Birth: 1945-06-06  Date of referral:  09/24/14               Reason for consult:  Facility Placement                Permission sought to share information with:    Permission granted to share information::     Name::        Agency::     Relationship::     Contact Information:     Housing/Transportation Living arrangements for the past 2 months:  Single Family Home Source of Information:  Patient, Other (Comment Required) (Sister, Alexis Dillon and Alexis Dillon) Patient Interpreter Needed:  None Criminal Activity/Legal Involvement Pertinent to Current Situation/Hospitalization:  No - Comment as needed Significant Relationships:  Siblings Lives with:  Siblings Do you feel safe going back to the place where you live?  Yes Need for family participation in patient care:  Yes (Comment) (Family willl need to continue to provide support to patient as she needs it. )  Care giving concerns:  Patient resides with her sister.  When her sister is at work, her nephew is now in the home and can assist in her care.   Social Worker assessment / plan: CSW met with patient.  CSW discussed with patient her baseline functioning.  Patient indicated that at baseline she uses a walker, wheelchair and cane.  She indicated that she uses her walker most often.  Patient indicated that she recently used her cane and did well.  CSW discussed the PT recommendation of ALF.  Patient indicated that she wanted to go to SNF at [place].  CSW educated patient on the requirements for a skilled need.  CSW provided patient with ALF list. CSW discussed with patient that if she went to ALF, she would have to give up her monthly check and would receive $66.00 out of it.  Patient indicated that she did not desire to lose her check and would return home with her sister.  CSW spoke with patient's sister, Alexis Dillon. CSW advised Alexis Dillon of the PT  recommendation of ALF as well as patient's desire to keep her monthly check. Ms. Alexis Dillon indicated that she and patient had been through discussion about ALF before and she knew that patient did not want to lose her check.  She advised that patient could come back to her home.   CSW signing off.   Employment status:  Disabled (Comment on whether or not currently receiving Disability) (Receives $700/month disability) Insurance information:  Medicaid In [place], [place] PT Recommendations:   (ALF) Information / Referral to community resources:   ([place] facility list)  Patient/Family's Response to care:  Patient does not to wish to go to ALF as she does not want to lose her check.   Patient/Family's Understanding of and Emotional Response to Diagnosis, Current Treatment, and Prognosis:  Patient and sister, Alexis Dillon, are agreeable for patient to return home.   Emotional Assessment Appearance:  Developmentally appropriate Attitude/Demeanor/Rapport:   (Pleasant and cooperative) Affect (typically observed):  Calm, Accepting Orientation:  Oriented to Self, Oriented to Place, Oriented to Situation Alcohol / Substance use:  Never Used Psych involvement (Current and /or in the community):  No (Comment)  Discharge Needs  Concerns to be addressed:  No discharge needs identified Readmission within the last 30 days:  No Current discharge risk:  None Barriers to Discharge:  No  Barriers Identified   Alexis Gully, LCSW 09/24/2014, 1:33 PM

## 2014-09-25 LAB — BASIC METABOLIC PANEL
Anion gap: 8 (ref 5–15)
BUN: 45 mg/dL — AB (ref 6–20)
CALCIUM: 8.7 mg/dL — AB (ref 8.9–10.3)
CO2: 29 mmol/L (ref 22–32)
Chloride: 104 mmol/L (ref 101–111)
Creatinine, Ser: 1.87 mg/dL — ABNORMAL HIGH (ref 0.44–1.00)
GFR calc Af Amer: 31 mL/min — ABNORMAL LOW (ref >60)
GFR calc non Af Amer: 26 mL/min — ABNORMAL LOW (ref >60)
GLUCOSE: 170 mg/dL — AB (ref 70–99)
POTASSIUM: 4.1 mmol/L (ref 3.5–5.1)
Sodium: 141 mmol/L (ref 135–145)

## 2014-09-25 LAB — GLUCOSE, CAPILLARY: Glucose-Capillary: 192 mg/dL — ABNORMAL HIGH (ref 70–99)

## 2014-09-25 MED ORDER — HYDROCODONE-ACETAMINOPHEN 5-325 MG PO TABS
1.0000 | ORAL_TABLET | Freq: Two times a day (BID) | ORAL | Status: DC | PRN
  Administered 2014-09-25 – 2014-09-26 (×2): 1 via ORAL
  Filled 2014-09-25 (×2): qty 1

## 2014-09-25 MED ORDER — IPRATROPIUM-ALBUTEROL 0.5-2.5 (3) MG/3ML IN SOLN
3.0000 mL | Freq: Three times a day (TID) | RESPIRATORY_TRACT | Status: DC
  Administered 2014-09-25 – 2014-09-26 (×2): 3 mL via RESPIRATORY_TRACT
  Filled 2014-09-25 (×2): qty 3

## 2014-09-25 MED ORDER — FUROSEMIDE 20 MG PO TABS
20.0000 mg | ORAL_TABLET | Freq: Every day | ORAL | Status: DC
  Administered 2014-09-26: 20 mg via ORAL
  Filled 2014-09-25: qty 1

## 2014-09-25 MED ORDER — PREDNISONE 20 MG PO TABS
40.0000 mg | ORAL_TABLET | Freq: Every day | ORAL | Status: DC
  Administered 2014-09-25 – 2014-09-26 (×2): 40 mg via ORAL
  Filled 2014-09-25 (×2): qty 2

## 2014-09-25 NOTE — Progress Notes (Signed)
Subjective: She says she feels better. She did not wear her BiPAP last night because it was uncomfortable. She says otherwise she is doing okay. Her breathing is better.  Objective: Vital signs in last 24 hours: Temp:  [97.6 F (36.4 C)-98.2 F (36.8 C)] 97.7 F (36.5 C) (05/11 0756) Pulse Rate:  [62-119] 105 (05/11 0700) Resp:  [15-23] 16 (05/11 0700) BP: (95-127)/(45-72) 127/71 mmHg (05/11 0700) SpO2:  [88 %-100 %] 99 % (05/11 0700) Weight:  [43.591 kg (96 lb 1.6 oz)] 43.591 kg (96 lb 1.6 oz) (05/11 0514) Weight change: -0.909 kg (-2 lb 0.1 oz) Last BM Date: 09/23/14  Intake/Output from previous day: 05/10 0701 - 05/11 0700 In: 240 [P.O.:240] Out: 850 [Urine:850]  PHYSICAL EXAM General appearance: alert, cooperative and no distress Resp: clear to auscultation bilaterally Cardio: regular rate and rhythm, S1, S2 normal, no murmur, click, rub or gallop GI: soft, non-tender; bowel sounds normal; no masses,  no organomegaly Extremities: extremities normal, atraumatic, no cyanosis or edema  Lab Results:  Results for orders placed or performed during the hospital encounter of 09/23/14 (from the past 48 hour(s))  Troponin I     Status: None   Collection Time: 09/23/14  9:14 AM  Result Value Ref Range   Troponin I <0.03 <0.031 ng/mL    Comment:        NO INDICATION OF MYOCARDIAL INJURY.   CBC     Status: Abnormal   Collection Time: 09/23/14  9:14 AM  Result Value Ref Range   WBC 9.7 4.0 - 10.5 K/uL   RBC 4.19 3.87 - 5.11 MIL/uL   Hemoglobin 11.2 (L) 12.0 - 15.0 g/dL   HCT 37.9 36.0 - 46.0 %   MCV 90.5 78.0 - 100.0 fL   MCH 26.7 26.0 - 34.0 pg   MCHC 29.6 (L) 30.0 - 36.0 g/dL   RDW 16.2 (H) 11.5 - 15.5 %   Platelets 234 150 - 400 K/uL  Creatinine, serum     Status: Abnormal   Collection Time: 09/23/14  9:14 AM  Result Value Ref Range   Creatinine, Ser 1.60 (H) 0.44 - 1.00 mg/dL   GFR calc non Af Amer 32 (L) >60 mL/min   GFR calc Af Amer 37 (L) >60 mL/min    Comment:  (NOTE) The eGFR has been calculated using the CKD EPI equation. This calculation has not been validated in all clinical situations. eGFR's persistently <60 mL/min signify possible Chronic Kidney Disease.   MRSA PCR Screening     Status: None   Collection Time: 09/23/14  1:20 PM  Result Value Ref Range   MRSA by PCR NEGATIVE NEGATIVE    Comment:        The GeneXpert MRSA Assay (FDA approved for NASAL specimens only), is one component of a comprehensive MRSA colonization surveillance program. It is not intended to diagnose MRSA infection nor to guide or monitor treatment for MRSA infections.   Troponin I     Status: None   Collection Time: 09/23/14  2:36 PM  Result Value Ref Range   Troponin I <0.03 <0.031 ng/mL    Comment:        NO INDICATION OF MYOCARDIAL INJURY.   Troponin I     Status: None   Collection Time: 09/23/14  7:59 PM  Result Value Ref Range   Troponin I <0.03 <0.031 ng/mL    Comment:        NO INDICATION OF MYOCARDIAL INJURY.   Basic metabolic panel  Status: Abnormal   Collection Time: 09/24/14  6:03 AM  Result Value Ref Range   Sodium 143 135 - 145 mmol/L   Potassium 3.7 3.5 - 5.1 mmol/L   Chloride 104 101 - 111 mmol/L   CO2 28 22 - 32 mmol/L   Glucose, Bld 159 (H) 70 - 99 mg/dL   BUN 30 (H) 6 - 20 mg/dL   Creatinine, Ser 1.57 (H) 0.44 - 1.00 mg/dL   Calcium 9.0 8.9 - 10.3 mg/dL   GFR calc non Af Amer 33 (L) >60 mL/min   GFR calc Af Amer 38 (L) >60 mL/min    Comment: (NOTE) The eGFR has been calculated using the CKD EPI equation. This calculation has not been validated in all clinical situations. eGFR's persistently <60 mL/min signify possible Chronic Kidney Disease.    Anion gap 11 5 - 15  Basic metabolic panel     Status: Abnormal   Collection Time: 09/25/14  5:28 AM  Result Value Ref Range   Sodium 141 135 - 145 mmol/L   Potassium 4.1 3.5 - 5.1 mmol/L   Chloride 104 101 - 111 mmol/L   CO2 29 22 - 32 mmol/L   Glucose, Bld 170 (H) 70 -  99 mg/dL   BUN 45 (H) 6 - 20 mg/dL   Creatinine, Ser 1.87 (H) 0.44 - 1.00 mg/dL   Calcium 8.7 (L) 8.9 - 10.3 mg/dL   GFR calc non Af Amer 26 (L) >60 mL/min   GFR calc Af Amer 31 (L) >60 mL/min    Comment: (NOTE) The eGFR has been calculated using the CKD EPI equation. This calculation has not been validated in all clinical situations. eGFR's persistently <60 mL/min signify possible Chronic Kidney Disease.    Anion gap 8 5 - 15    ABGS  Recent Labs  09/23/14 0600  TCO2 24.6  HCO3 25.4*   CULTURES Recent Results (from the past 240 hour(s))  MRSA PCR Screening     Status: None   Collection Time: 09/23/14  1:20 PM  Result Value Ref Range Status   MRSA by PCR NEGATIVE NEGATIVE Final    Comment:        The GeneXpert MRSA Assay (FDA approved for NASAL specimens only), is one component of a comprehensive MRSA colonization surveillance program. It is not intended to diagnose MRSA infection nor to guide or monitor treatment for MRSA infections.    Studies/Results: No results found.  Medications:  Prior to Admission:  Prescriptions prior to admission  Medication Sig Dispense Refill Last Dose  . acetaminophen (TYLENOL) 325 MG tablet Take 2 tablets (650 mg total) by mouth every 6 (six) hours as needed for mild pain or fever.   unknown  . albuterol (PROVENTIL HFA;VENTOLIN HFA) 108 (90 BASE) MCG/ACT inhaler Inhale 2 puffs into the lungs every 6 (six) hours as needed for wheezing or shortness of breath.   unknown  . albuterol (PROVENTIL) (2.5 MG/3ML) 0.083% nebulizer solution Take 3 mLs (2.5 mg total) by nebulization every 2 (two) hours as needed for wheezing or shortness of breath. 75 mL 12 unknown  . ALPRAZolam (XANAX) 0.25 MG tablet Take 0.25 mg by mouth 3 (three) times daily as needed for anxiety.    unknown  . amLODipine (NORVASC) 2.5 MG tablet Take 1 tablet (2.5 mg total) by mouth daily. 30 tablet 6 unknown  . aspirin EC 81 MG tablet Take 1 tablet (81 mg total) by mouth  daily. 30 tablet 6 unknown  . atorvastatin (LIPITOR)  80 MG tablet Take 80 mg by mouth daily.   unknown  . carvedilol (COREG) 6.25 MG tablet Take 1 tablet (6.25 mg total) by mouth 2 (two) times daily with a meal. 60 tablet 6 unknown  . cholecalciferol (VITAMIN D) 1000 UNITS tablet Take 2,000 Units by mouth daily.   unknown  . furosemide (LASIX) 20 MG tablet Take 20 mg by mouth daily.   unknown  . HYDROcodone-acetaminophen (NORCO/VICODIN) 5-325 MG per tablet Take 1 tablet by mouth 2 (two) times daily as needed for moderate pain.    unknown  . ipratropium-albuterol (DUONEB) 0.5-2.5 (3) MG/3ML SOLN Take 3 mLs by nebulization every 6 (six) hours as needed (SHORTNESS OF BREATH).   unknown  . isosorbide mononitrate (IMDUR) 30 MG 24 hr tablet Take 1 tablet (30 mg total) by mouth daily. 30 tablet 6 unknown  . NITROSTAT 0.4 MG SL tablet Take 1 tablet by mouth daily as needed for chest pain.    unknown  . pantoprazole (PROTONIX) 40 MG tablet Take 40 mg by mouth daily.   unknown at Unknown time   Scheduled: . amLODipine  2.5 mg Oral Daily  . antiseptic oral rinse  7 mL Mouth Rinse q12n4p  . aspirin EC  81 mg Oral Daily  . atorvastatin  80 mg Oral Daily  . carvedilol  6.25 mg Oral BID WC  . chlorhexidine  15 mL Mouth Rinse BID  . furosemide  40 mg Intravenous BID  . heparin  5,000 Units Subcutaneous 3 times per day  . ipratropium-albuterol  3 mL Nebulization Q4H  . isosorbide mononitrate  30 mg Oral Daily  . methylPREDNISolone (SOLU-MEDROL) injection  60 mg Intravenous Q6H  . pantoprazole  40 mg Oral Daily  . potassium chloride  20 mEq Oral BID  . sodium chloride  3 mL Intravenous Q12H   Continuous:  BOF:BPZWCH chloride, acetaminophen, ALPRAZolam, morphine injection, ondansetron (ZOFRAN) IV, sodium chloride  Assesment: She was admitted with acute on chronic hypercapnic respiratory failure. This appears to be multifactorial and includes COPD exacerbation possibly pneumonia and acute on chronic  combined systolic and diastolic heart failure. She has diuresed significantly and has improved. She required BiPAP initially during her hospitalization but now is off BiPAP and refusing to wear it.  She has chronic kidney disease and her renal function is somewhat worse probably from diuresis so I'm going to back off on diuresis.  Her heart failure is much improved and she is now 5 L negative.  She has hypertension which is well controlled  She was evaluated by physical therapy and is not felt to need rehabilitation. Per my discussion and discussion from social work she will plan to go home with home health services  Active Problems:   COPD (chronic obstructive pulmonary disease)   Hypertension   Mental retardation, idiopathic mild   CKD (chronic kidney disease), stage III   Hyperlipidemia   Anemia   Acute on chronic respiratory failure with hypercapnia   Acute on chronic combined systolic and diastolic CHF (congestive heart failure)   Acute on chronic respiratory failure    Plan:Transfer from ICU today. Potential discharge tomorrow.   LOS: 2 days   Bradee Common L 09/25/2014, 8:08 AM

## 2014-09-25 NOTE — Consult Note (Addendum)
   [facility] CM Inpatient Consult   09/25/2014  Alexis Dillon 12-10-1945 [id number]   Patient evaluated for [place] Management services. Called into patient's room and spoke with her to explain and discuss [facility] Care Management. She endorses she lives with her sister Alexis Dillon [phone]. Reports her sister and niece take her to MD appointments, fill her pill box, and assist in caring for her at home. Denies having issues with transportation or affording medications. Endorses that she can benefit from disease management and education regarding COPD and CHF. Verbal permission and consent received for [facility] Care Management follow up post hospital discharge. Explained that [place] Management services will not replace or interfere with services provided by home health. Explained to [name] that she will receive a series of post hospital discharge calls and will be evaluated for monthly home visits. Verbal permission also given to speak with her sister Alexis Dillon. Confirmed Primary Care MD as Dr. Luan Pulling.  Will make inpatient RNCM aware [facility] to follow and will request patient to be assigned to [facility].  Also attempted to reach sister, Alexis Dillon to make aware that [place] Management to follow. However, there was no answer.   Marthenia Rolling, MSN-Ed, RN,BSN [facility] Liaison [phone]

## 2014-09-25 NOTE — Progress Notes (Signed)
Patient has refused to wear the BIPAP because she said" It makes my face hurt and i refuse to wear it." So she remains on her nasal cannula and she is being monitored for any changes in status.

## 2014-09-25 NOTE — Progress Notes (Signed)
PT UP TO BATHROOM FOR AM CARE AFTER FOLEY CATHETER REMOVED. PT HAS VOIDED SINCE REMOVAL.NO SOB OR DISTRESS. PT ALERT AND ORIENTED.PT TRANSFERRING TO ROOM 336. TRANSFER REPORT CALLED TO MEGAN RN ON 300.Marland Kitchen

## 2014-09-26 LAB — BASIC METABOLIC PANEL
Anion gap: 8 (ref 5–15)
BUN: 44 mg/dL — ABNORMAL HIGH (ref 6–20)
CO2: 30 mmol/L (ref 22–32)
CREATININE: 1.57 mg/dL — AB (ref 0.44–1.00)
Calcium: 8.6 mg/dL — ABNORMAL LOW (ref 8.9–10.3)
Chloride: 104 mmol/L (ref 101–111)
GFR, EST AFRICAN AMERICAN: 38 mL/min — AB (ref >60)
GFR, EST NON AFRICAN AMERICAN: 33 mL/min — AB (ref >60)
Glucose, Bld: 151 mg/dL — ABNORMAL HIGH (ref 65–99)
Potassium: 4.3 mmol/L (ref 3.5–5.1)
SODIUM: 142 mmol/L (ref 135–145)

## 2014-09-26 MED ORDER — METHYLPREDNISOLONE 4 MG PO TBPK: ORAL_TABLET | ORAL | Status: DC

## 2014-09-26 MED ORDER — FUROSEMIDE 20 MG PO TABS: 40.0000 mg | ORAL_TABLET | Freq: Every day | ORAL | Status: DC

## 2014-09-26 MED ORDER — POTASSIUM CHLORIDE ER 10 MEQ PO TBCR: 10.0000 meq | EXTENDED_RELEASE_TABLET | Freq: Two times a day (BID) | ORAL | Status: DC

## 2014-09-26 NOTE — Progress Notes (Signed)
[place] Returned patient's brother Gene Vantuyl's call, no answer, message left to call this Probation officer at [phone].

## 2014-09-26 NOTE — Progress Notes (Signed)
[place] with patient's sister [name] who was at work at the time. [name] stated that she would call her brother to see if he could come to the hospital to pick up patient and take her home.

## 2014-09-26 NOTE — Progress Notes (Signed)
Subjective: She feels well. She has no new complaints.  Objective: Vital signs in last 24 hours: Temp:  [97.9 F (36.6 C)-98 F (36.7 C)] 97.9 F (36.6 C) (05/12 0547) Pulse Rate:  [52-62] 60 (05/12 0547) Resp:  [17-18] 18 (05/12 0547) BP: (104-125)/(44-62) 123/59 mmHg (05/12 0547) SpO2:  [95 %-99 %] 98 % (05/12 0834) Weight:  [44.316 kg (97 lb 11.2 oz)] 44.316 kg (97 lb 11.2 oz) (05/12 0547) Weight change: 0.726 kg (1 lb 9.6 oz) Last BM Date: 09/25/14  Intake/Output from previous day: 05/11 0701 - 05/12 0700 In: 1080 [P.O.:1080] Out: 2750 [Urine:2750]  PHYSICAL EXAM General appearance: alert, cooperative and no distress Resp: rhonchi bilaterally Cardio: regular rate and rhythm, S1, S2 normal, no murmur, click, rub or gallop GI: soft, non-tender; bowel sounds normal; no masses,  no organomegaly Extremities: extremities normal, atraumatic, no cyanosis or edema  Lab Results:  Results for orders placed or performed during the hospital encounter of 09/23/14 (from the past 48 hour(s))  Basic metabolic panel     Status: Abnormal   Collection Time: 09/25/14  5:28 AM  Result Value Ref Range   Sodium 141 135 - 145 mmol/L   Potassium 4.1 3.5 - 5.1 mmol/L   Chloride 104 101 - 111 mmol/L   CO2 29 22 - 32 mmol/L   Glucose, Bld 170 (H) 70 - 99 mg/dL   BUN 45 (H) 6 - 20 mg/dL   Creatinine, Ser 1.87 (H) 0.44 - 1.00 mg/dL   Calcium 8.7 (L) 8.9 - 10.3 mg/dL   GFR calc non Af Amer 26 (L) >60 mL/min   GFR calc Af Amer 31 (L) >60 mL/min    Comment: (NOTE) The eGFR has been calculated using the CKD EPI equation. This calculation has not been validated in all clinical situations. eGFR's persistently <60 mL/min signify possible Chronic Kidney Disease.    Anion gap 8 5 - 15  Glucose, capillary     Status: Abnormal   Collection Time: 09/25/14  9:26 PM  Result Value Ref Range   Glucose-Capillary 192 (H) 70 - 99 mg/dL  Basic metabolic panel     Status: Abnormal   Collection Time:  09/26/14  6:51 AM  Result Value Ref Range   Sodium 142 135 - 145 mmol/L   Potassium 4.3 3.5 - 5.1 mmol/L   Chloride 104 101 - 111 mmol/L   CO2 30 22 - 32 mmol/L   Glucose, Bld 151 (H) 65 - 99 mg/dL   BUN 44 (H) 6 - 20 mg/dL   Creatinine, Ser 1.57 (H) 0.44 - 1.00 mg/dL   Calcium 8.6 (L) 8.9 - 10.3 mg/dL   GFR calc non Af Amer 33 (L) >60 mL/min   GFR calc Af Amer 38 (L) >60 mL/min    Comment: (NOTE) The eGFR has been calculated using the CKD EPI equation. This calculation has not been validated in all clinical situations. eGFR's persistently <60 mL/min signify possible Chronic Kidney Disease.    Anion gap 8 5 - 15    ABGS No results for input(s): PHART, PO2ART, TCO2, HCO3 in the last 72 hours.  Invalid input(s): PCO2 CULTURES Recent Results (from the past 240 hour(s))  MRSA PCR Screening     Status: None   Collection Time: 09/23/14  1:20 PM  Result Value Ref Range Status   MRSA by PCR NEGATIVE NEGATIVE Final    Comment:        The GeneXpert MRSA Assay (FDA approved for NASAL specimens only), is  one component of a comprehensive MRSA colonization surveillance program. It is not intended to diagnose MRSA infection nor to guide or monitor treatment for MRSA infections.    Studies/Results: No results found.  Medications:  Prior to Admission:  Prescriptions prior to admission  Medication Sig Dispense Refill Last Dose  . acetaminophen (TYLENOL) 325 MG tablet Take 2 tablets (650 mg total) by mouth every 6 (six) hours as needed for mild pain or fever.   unknown  . albuterol (PROVENTIL HFA;VENTOLIN HFA) 108 (90 BASE) MCG/ACT inhaler Inhale 2 puffs into the lungs every 6 (six) hours as needed for wheezing or shortness of breath.   unknown  . albuterol (PROVENTIL) (2.5 MG/3ML) 0.083% nebulizer solution Take 3 mLs (2.5 mg total) by nebulization every 2 (two) hours as needed for wheezing or shortness of breath. 75 mL 12 unknown  . ALPRAZolam (XANAX) 0.25 MG tablet Take 0.25 mg by  mouth 3 (three) times daily as needed for anxiety.    unknown  . amLODipine (NORVASC) 2.5 MG tablet Take 1 tablet (2.5 mg total) by mouth daily. 30 tablet 6 unknown  . aspirin EC 81 MG tablet Take 1 tablet (81 mg total) by mouth daily. 30 tablet 6 unknown  . atorvastatin (LIPITOR) 80 MG tablet Take 80 mg by mouth daily.   unknown  . carvedilol (COREG) 6.25 MG tablet Take 1 tablet (6.25 mg total) by mouth 2 (two) times daily with a meal. 60 tablet 6 unknown  . cholecalciferol (VITAMIN D) 1000 UNITS tablet Take 2,000 Units by mouth daily.   unknown  . furosemide (LASIX) 20 MG tablet Take 20 mg by mouth daily.   unknown  . HYDROcodone-acetaminophen (NORCO/VICODIN) 5-325 MG per tablet Take 1 tablet by mouth 2 (two) times daily as needed for moderate pain.    unknown  . ipratropium-albuterol (DUONEB) 0.5-2.5 (3) MG/3ML SOLN Take 3 mLs by nebulization every 6 (six) hours as needed (SHORTNESS OF BREATH).   unknown  . isosorbide mononitrate (IMDUR) 30 MG 24 hr tablet Take 1 tablet (30 mg total) by mouth daily. 30 tablet 6 unknown  . NITROSTAT 0.4 MG SL tablet Take 1 tablet by mouth daily as needed for chest pain.    unknown  . pantoprazole (PROTONIX) 40 MG tablet Take 40 mg by mouth daily.   unknown at Unknown time   Scheduled: . amLODipine  2.5 mg Oral Daily  . aspirin EC  81 mg Oral Daily  . atorvastatin  80 mg Oral Daily  . carvedilol  6.25 mg Oral BID WC  . furosemide  20 mg Oral Daily  . heparin  5,000 Units Subcutaneous 3 times per day  . ipratropium-albuterol  3 mL Nebulization TID  . isosorbide mononitrate  30 mg Oral Daily  . pantoprazole  40 mg Oral Daily  . potassium chloride  20 mEq Oral BID  . predniSONE  40 mg Oral Q breakfast  . sodium chloride  3 mL Intravenous Q12H   Continuous:  HUD:JSHFWY chloride, acetaminophen, ALPRAZolam, HYDROcodone-acetaminophen, ondansetron (ZOFRAN) IV, sodium chloride  Assesment: She was admitted with acute on chronic respiratory failure with COPD  exacerbation and acute on chronic combined systolic and diastolic heart failure. She has markedly improved and is ready for discharge.  She has chronic kidney disease and when she was being aggressively diuresed and her renal function was somewhat worse Active Problems:   COPD (chronic obstructive pulmonary disease)   Hypertension   Mental retardation, idiopathic mild   CKD (chronic kidney disease), stage  III   Hyperlipidemia   Anemia   Acute on chronic respiratory failure with hypercapnia   Acute on chronic combined systolic and diastolic CHF (congestive heart failure)   Acute on chronic respiratory failure    Plan: She is ready for discharge with home health services    LOS: 3 days   Karmelo Bass L 09/26/2014, 8:53 AM

## 2014-09-26 NOTE — Discharge Summary (Signed)
Physician Discharge Summary  Patient ID: Alexis Dillon MRN: [id number] DOB/AGE: 01/16/1946 69 y.o. Primary Care Physician:Nika Yazzie L, MD Admit date: 09/23/2014 Discharge date: 09/26/2014    Discharge Diagnoses:   Active Problems:   COPD (chronic obstructive pulmonary disease)   Hypertension   Mental retardation, idiopathic mild   CKD (chronic kidney disease), stage III   Hyperlipidemia   Ischemic cardiomyopathy   PAF (paroxysmal atrial fibrillation)   Chronic combined systolic and diastolic CHF (congestive heart failure)   Anemia   Acute on chronic respiratory failure with hypercapnia   Acute on chronic combined systolic and diastolic CHF (congestive heart failure)   Acute on chronic respiratory failure     Medication List    TAKE these medications        acetaminophen 325 MG tablet  Commonly known as:  TYLENOL  Take 2 tablets (650 mg total) by mouth every 6 (six) hours as needed for mild pain or fever.     albuterol 108 (90 BASE) MCG/ACT inhaler  Commonly known as:  PROVENTIL HFA;VENTOLIN HFA  Inhale 2 puffs into the lungs every 6 (six) hours as needed for wheezing or shortness of breath.     albuterol (2.5 MG/3ML) 0.083% nebulizer solution  Commonly known as:  PROVENTIL  Take 3 mLs (2.5 mg total) by nebulization every 2 (two) hours as needed for wheezing or shortness of breath.     ALPRAZolam 0.25 MG tablet  Commonly known as:  XANAX  Take 0.25 mg by mouth 3 (three) times daily as needed for anxiety.     amLODipine 2.5 MG tablet  Commonly known as:  NORVASC  Take 1 tablet (2.5 mg total) by mouth daily.     aspirin EC 81 MG tablet  Take 1 tablet (81 mg total) by mouth daily.     atorvastatin 80 MG tablet  Commonly known as:  LIPITOR  Take 80 mg by mouth daily.     carvedilol 6.25 MG tablet  Commonly known as:  COREG  Take 1 tablet (6.25 mg total) by mouth 2 (two) times daily with a meal.     cholecalciferol 1000 UNITS tablet  Commonly known as:   VITAMIN D  Take 2,000 Units by mouth daily.     furosemide 20 MG tablet  Commonly known as:  LASIX  Take 2 tablets (40 mg total) by mouth daily.     HYDROcodone-acetaminophen 5-325 MG per tablet  Commonly known as:  NORCO/VICODIN  Take 1 tablet by mouth 2 (two) times daily as needed for moderate pain.     ipratropium-albuterol 0.5-2.5 (3) MG/3ML Soln  Commonly known as:  DUONEB  Take 3 mLs by nebulization every 6 (six) hours as needed (SHORTNESS OF BREATH).     isosorbide mononitrate 30 MG 24 hr tablet  Commonly known as:  IMDUR  Take 1 tablet (30 mg total) by mouth daily.     methylPREDNISolone 4 MG Tbpk tablet  Commonly known as:  MEDROL DOSEPAK  Take as directed     NITROSTAT 0.4 MG SL tablet  Generic drug:  nitroGLYCERIN  Take 1 tablet by mouth daily as needed for chest pain.     pantoprazole 40 MG tablet  Commonly known as:  PROTONIX  Take 40 mg by mouth daily.     potassium chloride 10 MEQ tablet  Commonly known as:  K-DUR  Take 1 tablet (10 mEq total) by mouth 2 (two) times daily.        Discharged Condition: Improved  Consults: None  Significant Diagnostic Studies: Dg Chest Portable 1 View  09/23/2014   CLINICAL DATA:  Sudden onset shortness of breath. History of COPD. CAD. CHF.  EXAM: PORTABLE CHEST - 1 VIEW  COMPARISON:  07/14/2014  FINDINGS: Postoperative changes in the mediastinum. Cardiac enlargement, increasing since previous study. Increased pulmonary vascular congestion. Interstitial changes suggesting interstitial edema. No blunting of costophrenic angles. No pneumothorax. Mediastinal contours appear intact.  IMPRESSION: Development of congestive changes with cardiac enlargement, pulmonary vascular congestion, and interstitial edema.   Electronically Signed   By: Lucienne Capers M.D.   On: 09/23/2014 05:46    Lab Results: Basic Metabolic Panel:  Recent Labs  09/25/14 0528 09/26/14 0651  NA 141 142  K 4.1 4.3  CL 104 104  CO2 29 30   GLUCOSE 170* 151*  BUN 45* 44*  CREATININE 1.87* 1.57*  CALCIUM 8.7* 8.6*   Liver Function Tests: No results for input(s): AST, ALT, ALKPHOS, BILITOT, PROT, ALBUMIN in the last 72 hours.   CBC:  Recent Labs  09/23/14 0914  WBC 9.7  HGB 11.2*  HCT 37.9  MCV 90.5  PLT 234    Recent Results (from the past 240 hour(s))  MRSA PCR Screening     Status: None   Collection Time: 09/23/14  1:20 PM  Result Value Ref Range Status   MRSA by PCR NEGATIVE NEGATIVE Final    Comment:        The GeneXpert MRSA Assay (FDA approved for NASAL specimens only), is one component of a comprehensive MRSA colonization surveillance program. It is not intended to diagnose MRSA infection nor to guide or monitor treatment for MRSA infections.      Hospital Course: This is a 69 year old who developed increasing problems with shortness of breath. She came to the emergency department and was found to have acute respiratory failure. This appeared to be combined from acute exacerbation of COPD and acute on chronic combined systolic and diastolic heart failure. She was treated with BiPAP and diuresis steroids inhaled bronchodilators and improved. She was initially in the stepdown unit and was transferred out. By the time of discharge she was back at baseline. There was consideration of skilled care facility but that she did not qualify and did not want to go to assisted living facility so she will go home with extensive home health services  Discharge Exam: Blood pressure 123/59, pulse 60, temperature 97.9 F (36.6 C), temperature source Oral, resp. rate 18, height 5\' 2"  (1.575 m), weight 44.316 kg (97 lb 11.2 oz), SpO2 98 %. She is awake and alert. Her chest is clear. Her heart is regular  Disposition: Home with home health services      Discharge Instructions    AMB Referral to [place] Management    Complete by:  As directed   Please assign to [facility]. Verbal consent only as patient is  at [facility] currently. She also gives permission to speak with her sister, [name] whom she lives with. Please review inpatient LCSW notes from 5/10 for further details. [facility] RNCM please assess need for [facility] LCSW as well. Will go home with home health. Disease management and education for COPD and CHF. Has been admitted x 3 in past 6 months. Likely discharge soon. Please call with questions. Marthenia Rolling, [place], [facility] Liaison-[phone]  Reason for consult:  Please assign to [facility] RNCM  Diagnoses of:   Heart Failure COPD/ Pneumonia    Expected date  of contact:  1-3 days (reserved for hospital discharges)     Discharge patient    Complete by:  As directed      Face-to-face encounter (required for Medicare/Medicaid patients)    Complete by:  As directed   I Monzerrat Wellen L certify that this patient is under my care and that I, or a nurse practitioner or physician's assistant working with me, had a face-to-face encounter that meets the physician face-to-face encounter requirements with this patient on 09/26/2014. The encounter with the patient was in whole, or in part for the following medical condition(s) which is the primary reason for home health care (List medical condition): Acute on chronic respiratory failure/acute on chronic combined systolic and diastolic heart failure  The encounter with the patient was in whole, or in part, for the following medical condition, which is the primary reason for home health care:  Acute on chronic respiratory failure /acute on chronic combined systolic and diastolic heart failure  I certify that, based on my findings, the following services are medically necessary home health services:   Nursing Physical therapy    Reason for Medically Necessary Home Health Services:  Skilled Nursing- Change/Decline in Patient Status  My clinical findings support the need for the above services:  Shortness of breath with activity   Further, I certify that my clinical findings support that this patient is homebound due to:  Shortness of Breath with activity     Home Health    Complete by:  As directed   To provide the following care/treatments:   PT RN    BMET on 09/28/2014             Signed: Sinda Du L   09/26/2014, 9:09 AM

## 2014-09-26 NOTE — Progress Notes (Signed)
1405 d/c instructions and paperwork given to patient and patient's brother Gene Mcquaig. Patient instructed to pick up Rxs at [facility]. IV cathter removed from LEFT AC, catheter tip intact, no s/s of infection noted. Patient assisted to vehicle by staff via w/c with all her belongings.

## 2014-09-26 NOTE — Care Management Note (Signed)
Case Management Note  Patient Details  Name: Alexis Dillon MRN: [id number] Date of Birth: 06/04/1945  Subjective/Objective:                    Action/Plan:   Expected Discharge Date:                  Expected Discharge Plan:  [place]  In-House Referral:  Clinical Social Work  Discharge planning Services  CM Consult  Post Acute Care Choice:  Home Health Choice offered to:  Patient  DME Arranged:    DME Agency:     HH Arranged:  PT, RN [place] Agency:  [place]  Status of Service:  Completed, signed off  Medicare Important Message Given:  Yes Date Medicare IM Given:  09/26/14 Medicare IM give by:  Christinia Gully, RN BSN CM Date Additional Medicare IM Given:    Additional Medicare Important Message give by:     If discussed at [place] of Stay Meetings, dates discussed:    Additional Comments: Pt discharged home today with [facility] RN and PT (per pts choice). Romualdo Bolk of [facility] is aware and will collect pts information from the chart. [place] services to start within 48 hours. No DME needs noted. [facility] is also following. Pt and pts nurse aware of discharge arrangements. Christinia Gully [place], RN 09/26/2014, 11:04 AM

## 2014-09-27 ENCOUNTER — Other Ambulatory Visit: Payer: Self-pay | Admitting: *Deleted

## 2014-09-27 DIAGNOSIS — E785 Hyperlipidemia, unspecified: Secondary | ICD-10-CM | POA: Diagnosis not present

## 2014-09-27 DIAGNOSIS — I480 Paroxysmal atrial fibrillation: Secondary | ICD-10-CM | POA: Diagnosis not present

## 2014-09-27 DIAGNOSIS — I129 Hypertensive chronic kidney disease with stage 1 through stage 4 chronic kidney disease, or unspecified chronic kidney disease: Secondary | ICD-10-CM | POA: Diagnosis not present

## 2014-09-27 DIAGNOSIS — I5040 Unspecified combined systolic (congestive) and diastolic (congestive) heart failure: Secondary | ICD-10-CM | POA: Diagnosis not present

## 2014-09-27 DIAGNOSIS — Z9981 Dependence on supplemental oxygen: Secondary | ICD-10-CM | POA: Diagnosis not present

## 2014-09-27 DIAGNOSIS — D649 Anemia, unspecified: Secondary | ICD-10-CM | POA: Diagnosis not present

## 2014-09-27 DIAGNOSIS — Z5181 Encounter for therapeutic drug level monitoring: Secondary | ICD-10-CM | POA: Diagnosis not present

## 2014-09-27 DIAGNOSIS — N183 Chronic kidney disease, stage 3 (moderate): Secondary | ICD-10-CM | POA: Diagnosis not present

## 2014-09-27 DIAGNOSIS — J449 Chronic obstructive pulmonary disease, unspecified: Secondary | ICD-10-CM | POA: Diagnosis not present

## 2014-09-27 NOTE — Patient Outreach (Signed)
TOC initial  outreach call attempted  No answer and unable to leave a VM. Plan to attempt again 09/30/14 [facility] E. Laymond Purser, RN, BSN, [place] [phone]

## 2014-09-28 DIAGNOSIS — I5040 Unspecified combined systolic (congestive) and diastolic (congestive) heart failure: Secondary | ICD-10-CM | POA: Diagnosis not present

## 2014-09-28 DIAGNOSIS — I129 Hypertensive chronic kidney disease with stage 1 through stage 4 chronic kidney disease, or unspecified chronic kidney disease: Secondary | ICD-10-CM | POA: Diagnosis not present

## 2014-09-28 DIAGNOSIS — E785 Hyperlipidemia, unspecified: Secondary | ICD-10-CM | POA: Diagnosis not present

## 2014-09-28 DIAGNOSIS — R0602 Shortness of breath: Secondary | ICD-10-CM | POA: Diagnosis not present

## 2014-09-28 DIAGNOSIS — I480 Paroxysmal atrial fibrillation: Secondary | ICD-10-CM | POA: Diagnosis not present

## 2014-09-28 DIAGNOSIS — J449 Chronic obstructive pulmonary disease, unspecified: Secondary | ICD-10-CM | POA: Diagnosis not present

## 2014-09-28 DIAGNOSIS — D649 Anemia, unspecified: Secondary | ICD-10-CM | POA: Diagnosis not present

## 2014-09-28 DIAGNOSIS — N183 Chronic kidney disease, stage 3 (moderate): Secondary | ICD-10-CM | POA: Diagnosis not present

## 2014-09-28 DIAGNOSIS — Z5181 Encounter for therapeutic drug level monitoring: Secondary | ICD-10-CM | POA: Diagnosis not present

## 2014-09-28 DIAGNOSIS — Z9981 Dependence on supplemental oxygen: Secondary | ICD-10-CM | POA: Diagnosis not present

## 2014-09-30 ENCOUNTER — Other Ambulatory Visit: Payer: Self-pay | Admitting: *Deleted

## 2014-09-30 ENCOUNTER — Encounter: Payer: Self-pay | Admitting: Internal Medicine

## 2014-09-30 NOTE — Patient Outreach (Signed)
TOC week 1 attempt number 2 No answer, unable to leave a message. Plan will continue to attempt to reach 10/01/14 [facility] E. Niemczura, RN, BSN, CCM

## 2014-10-01 ENCOUNTER — Other Ambulatory Visit: Payer: Self-pay | Admitting: *Deleted

## 2014-10-01 DIAGNOSIS — I129 Hypertensive chronic kidney disease with stage 1 through stage 4 chronic kidney disease, or unspecified chronic kidney disease: Secondary | ICD-10-CM | POA: Diagnosis not present

## 2014-10-01 DIAGNOSIS — Z5181 Encounter for therapeutic drug level monitoring: Secondary | ICD-10-CM | POA: Diagnosis not present

## 2014-10-01 DIAGNOSIS — E785 Hyperlipidemia, unspecified: Secondary | ICD-10-CM | POA: Diagnosis not present

## 2014-10-01 DIAGNOSIS — N183 Chronic kidney disease, stage 3 (moderate): Secondary | ICD-10-CM | POA: Diagnosis not present

## 2014-10-01 DIAGNOSIS — J449 Chronic obstructive pulmonary disease, unspecified: Secondary | ICD-10-CM | POA: Diagnosis not present

## 2014-10-01 DIAGNOSIS — D649 Anemia, unspecified: Secondary | ICD-10-CM | POA: Diagnosis not present

## 2014-10-01 DIAGNOSIS — Z9981 Dependence on supplemental oxygen: Secondary | ICD-10-CM | POA: Diagnosis not present

## 2014-10-01 DIAGNOSIS — I5040 Unspecified combined systolic (congestive) and diastolic (congestive) heart failure: Secondary | ICD-10-CM | POA: Diagnosis not present

## 2014-10-01 DIAGNOSIS — I480 Paroxysmal atrial fibrillation: Secondary | ICD-10-CM | POA: Diagnosis not present

## 2014-10-01 NOTE — Patient Outreach (Signed)
TOC call week #1 Spoke with patient sister, [name], patient gave verbal permission to Hospital Liaison to speak with sister, [name] on her behalf.  Reviewed [facility] program services, agree to participate Completed TOC call Gave RNCM contact information.  Scheduled initial home visit for next week.  Plan to make initial visit next week May 23 Abbie Berling E. Laymond Purser, RN, BSN, [place] [phone]

## 2014-10-02 DIAGNOSIS — I5040 Unspecified combined systolic (congestive) and diastolic (congestive) heart failure: Secondary | ICD-10-CM | POA: Diagnosis not present

## 2014-10-02 DIAGNOSIS — J449 Chronic obstructive pulmonary disease, unspecified: Secondary | ICD-10-CM | POA: Diagnosis not present

## 2014-10-02 DIAGNOSIS — D649 Anemia, unspecified: Secondary | ICD-10-CM | POA: Diagnosis not present

## 2014-10-02 DIAGNOSIS — I480 Paroxysmal atrial fibrillation: Secondary | ICD-10-CM | POA: Diagnosis not present

## 2014-10-02 DIAGNOSIS — I129 Hypertensive chronic kidney disease with stage 1 through stage 4 chronic kidney disease, or unspecified chronic kidney disease: Secondary | ICD-10-CM | POA: Diagnosis not present

## 2014-10-02 DIAGNOSIS — E785 Hyperlipidemia, unspecified: Secondary | ICD-10-CM | POA: Diagnosis not present

## 2014-10-02 DIAGNOSIS — Z9981 Dependence on supplemental oxygen: Secondary | ICD-10-CM | POA: Diagnosis not present

## 2014-10-02 DIAGNOSIS — N183 Chronic kidney disease, stage 3 (moderate): Secondary | ICD-10-CM | POA: Diagnosis not present

## 2014-10-02 DIAGNOSIS — Z5181 Encounter for therapeutic drug level monitoring: Secondary | ICD-10-CM | POA: Diagnosis not present

## 2014-10-03 DIAGNOSIS — J449 Chronic obstructive pulmonary disease, unspecified: Secondary | ICD-10-CM | POA: Diagnosis not present

## 2014-10-03 DIAGNOSIS — I25119 Atherosclerotic heart disease of native coronary artery with unspecified angina pectoris: Secondary | ICD-10-CM | POA: Diagnosis not present

## 2014-10-03 DIAGNOSIS — M1990 Unspecified osteoarthritis, unspecified site: Secondary | ICD-10-CM | POA: Diagnosis not present

## 2014-10-03 DIAGNOSIS — I509 Heart failure, unspecified: Secondary | ICD-10-CM | POA: Diagnosis not present

## 2014-10-04 DIAGNOSIS — I5040 Unspecified combined systolic (congestive) and diastolic (congestive) heart failure: Secondary | ICD-10-CM | POA: Diagnosis not present

## 2014-10-04 DIAGNOSIS — I480 Paroxysmal atrial fibrillation: Secondary | ICD-10-CM | POA: Diagnosis not present

## 2014-10-04 DIAGNOSIS — I129 Hypertensive chronic kidney disease with stage 1 through stage 4 chronic kidney disease, or unspecified chronic kidney disease: Secondary | ICD-10-CM | POA: Diagnosis not present

## 2014-10-04 DIAGNOSIS — D649 Anemia, unspecified: Secondary | ICD-10-CM | POA: Diagnosis not present

## 2014-10-04 DIAGNOSIS — Z9981 Dependence on supplemental oxygen: Secondary | ICD-10-CM | POA: Diagnosis not present

## 2014-10-04 DIAGNOSIS — Z5181 Encounter for therapeutic drug level monitoring: Secondary | ICD-10-CM | POA: Diagnosis not present

## 2014-10-04 DIAGNOSIS — J449 Chronic obstructive pulmonary disease, unspecified: Secondary | ICD-10-CM | POA: Diagnosis not present

## 2014-10-04 DIAGNOSIS — N183 Chronic kidney disease, stage 3 (moderate): Secondary | ICD-10-CM | POA: Diagnosis not present

## 2014-10-04 DIAGNOSIS — E785 Hyperlipidemia, unspecified: Secondary | ICD-10-CM | POA: Diagnosis not present

## 2014-10-07 ENCOUNTER — Other Ambulatory Visit: Payer: Self-pay | Admitting: *Deleted

## 2014-10-07 NOTE — Patient Outreach (Signed)
Alexis Dillon [facility]) Care Management   10/07/2014  Alexis Dillon [birth year] [id number]  Alexis Dillon is an 69 y.o. female  Goal: "weight to be back to 99 pounds, that is when I felt the best"  Subjective:  Patient reporting she has had 2 hospitalizations for HF over the past year. Patient is enrolled in [facility]'s HF tele-scale program. States she weighs daily, but does not record weights, also reports that they feel the weights go to office and feel they would receive a call if anything noted by weight. They also report not having any plan in place for weight gain such as taking extra fluid pill or calling the doctor.  Patient reports she just starts getting short of breath when she is "in trouble" and needs to go to the hospital for either CHF or COPD, that is her reported symptom.  Patient states she is getting home health nurse and PT. Patient not sure about follow up appointment for post hospital because the person who takes her is going out of town to a wedding.  Patient reporting she smokes 1-2 ppd, she says she feels she can quit "cold [place]" when [facility] discussed smoking cessation program, but will consider it.  Alexis Dillon, Patient sister reports that she encourages patient to follow low sodium diet, fluid restriction and medication compliance.  Nephew, Alexis Dillon, fills medication planner for patient, patient more compliant with morning medications than night medications.     Objective:   Patient well groomed, neatly dressed. Patient and sister, Alexis Dillon present for assessment.   Review of Systems  Constitutional: Positive for weight loss.       Patient reports weight today as 87# but she said she normally weighs 99#.   Eyes: Negative.   Respiratory: Positive for wheezing.   Cardiovascular: Negative.   Gastrointestinal: Negative.   Genitourinary: Negative.        Uses depends for overnight usage  Musculoskeletal: Negative.   Endo/Heme/Allergies: Negative.      Physical Exam  Constitutional: She is oriented to person, place, and time. She appears well-developed.  HENT:  Head: Normocephalic.  Neck: Normal range of motion.  Cardiovascular: Normal rate.   Respiratory: Effort normal. She has wheezes.  Expiratory wheezes   GI: Soft.  Neurological: She is alert and oriented to person, place, and time.  Skin: Skin is warm and dry.  Psychiatric: She has a normal mood and affect.    BP 112/60 mmHg  Pulse 76  Resp 20  Ht 1.575 m (5\' 2" )  Wt 87 lb (39.463 kg)  BMI 15.91 kg/m2  SpO2 99%  Current Medications:   Current Outpatient Prescriptions  Medication Sig Dispense Refill  . acetaminophen (TYLENOL) 325 MG tablet Take 2 tablets (650 mg total) by mouth every 6 (six) hours as needed for mild pain or fever.    Marland Kitchen albuterol (PROVENTIL HFA;VENTOLIN HFA) 108 (90 BASE) MCG/ACT inhaler Inhale 2 puffs into the lungs every 6 (six) hours as needed for wheezing or shortness of breath.    Marland Kitchen albuterol (PROVENTIL) (2.5 MG/3ML) 0.083% nebulizer solution Take 3 mLs (2.5 mg total) by nebulization every 2 (two) hours as needed for wheezing or shortness of breath. 75 mL 12  . ALPRAZolam (XANAX) 0.25 MG tablet Take 0.25 mg by mouth 3 (three) times daily as needed for anxiety.     Marland Kitchen amLODipine (NORVASC) 2.5 MG tablet Take 1 tablet (2.5 mg total) by mouth daily. 30 tablet 6  . aspirin EC 81 MG tablet Take 1  tablet (81 mg total) by mouth daily. 30 tablet 6  . atorvastatin (LIPITOR) 80 MG tablet Take 80 mg by mouth daily.    . carvedilol (COREG) 6.25 MG tablet Take 1 tablet (6.25 mg total) by mouth 2 (two) times daily with a meal. 60 tablet 6  . cholecalciferol (VITAMIN D) 1000 UNITS tablet Take 2,000 Units by mouth daily.    . furosemide (LASIX) 20 MG tablet Take 2 tablets (40 mg total) by mouth daily. 30 tablet 12  . isosorbide mononitrate (IMDUR) 30 MG 24 hr tablet Take 1 tablet (30 mg total) by mouth daily. 30 tablet 6  . NITROSTAT 0.4 MG SL tablet Take 1 tablet by  mouth daily as needed for chest pain.     . pantoprazole (PROTONIX) 40 MG tablet Take 40 mg by mouth daily.    . potassium chloride (K-DUR) 10 MEQ tablet Take 1 tablet (10 mEq total) by mouth 2 (two) times daily. 60 tablet 12  . HYDROcodone-acetaminophen (NORCO/VICODIN) 5-325 MG per tablet Take 1 tablet by mouth 2 (two) times daily as needed for moderate pain.     Marland Kitchen ipratropium-albuterol (DUONEB) 0.5-2.5 (3) MG/3ML SOLN Take 3 mLs by nebulization every 6 (six) hours as needed (SHORTNESS OF BREATH).    Marland Kitchen methylPREDNISolone (MEDROL DOSEPAK) 4 MG TBPK tablet Take as directed (Patient not taking: Reported on 10/07/2014) 21 tablet 0   No current facility-administered medications for this visit.    Functional Status:   In your present state of health, do you have any difficulty performing the following activities: 10/07/2014 09/23/2014  Hearing? - N  Vision? - N  Difficulty concentrating or making decisions? - N  Walking or climbing stairs? - N  Dressing or bathing? - N  Doing errands, shopping? - N  Conservation officer, nature and eating ? N -  Using the Toilet? N -  In the past six months, have you accidently leaked urine? Y -  Do you have problems with loss of bowel control? N -  Managing your Medications? Y -  Managing your Finances? Y -  Housekeeping or managing your Housekeeping? N -    Fall/Depression Screening:    PHQ 2/9 Scores 10/07/2014  PHQ - 2 Score 0    Assessment:   Reviewed [facility] program Call to MD office to schedule appointment and request refill of proair inhaler. Gave [facility] blue calendar, reviewed use of calendar and daily weight log. Reviewed COPD zones Reviewed CHF zones Instructed on fluid restrictions Needs plan for medication compliance-reviewed some ways to remember night time medications with patient and her sister.    Plan:   Continue TOC calls Send MD initial visit note and plan Continue [facility] CM community case management services. Refer to [place] for Advanced  Directives  Royetta Crochet. Laymond Purser, RN, BSN, [place] [phone]  [facility] CM Care Plan Problem One        Patient Outreach from 10/07/2014 in [place] Problem One  knowledge deficit related to HF diagnosis   Care Plan for Problem One  Active   [facility] CM Short Term Goal #1 (0-30 days)  Patient will not be hopsitalized for CHF in the next 30 days   [facility] CM Short Term Goal #1 Start Date  10/07/14   Interventions for Short Term Goal #1  using teachback method, reveiwed CHF zones in the blue [facility] calendar   [facility] CM Short Term Goal #2 (0-30 days)  Patient will keep  a weight log over the next 30 days   [facility] CM Short Term Goal #2 Start Date  10/07/14   Interventions for Short Term Goal #2  Using teachback method, reviewed weight log and requested patient log daily weights and take to medical appointments     [place] Problem Two        Patient Outreach from 10/07/2014 in [place] Problem Two  Knowledge deficit related to COPD diagnosis   Care Plan for Problem Two  Active   Interventions for Problem Two Long Term Goal   using teachback method offered FREE smoking cessation classes offered through [place] program   [facility] Long Term Goal (31-90) days  Patient will consider stopping smoking within the next 90 days   [facility] Long Term Goal Start Date  10/07/14   [facility] CM Short Term Goal #1 (0-30 days)  Patient will be compliant with wearing oxygen over the next 30 days   [facility] CM Short Term Goal #1 Start Date  10/07/14   Interventions for Short Term Goal #2   Using teachback method, encouraged patient to wear oxygen continuous as prescribed

## 2014-10-08 ENCOUNTER — Encounter: Payer: Self-pay | Admitting: *Deleted

## 2014-10-08 DIAGNOSIS — E785 Hyperlipidemia, unspecified: Secondary | ICD-10-CM | POA: Diagnosis not present

## 2014-10-08 DIAGNOSIS — D649 Anemia, unspecified: Secondary | ICD-10-CM | POA: Diagnosis not present

## 2014-10-08 DIAGNOSIS — Z9981 Dependence on supplemental oxygen: Secondary | ICD-10-CM | POA: Diagnosis not present

## 2014-10-08 DIAGNOSIS — I480 Paroxysmal atrial fibrillation: Secondary | ICD-10-CM | POA: Diagnosis not present

## 2014-10-08 DIAGNOSIS — I129 Hypertensive chronic kidney disease with stage 1 through stage 4 chronic kidney disease, or unspecified chronic kidney disease: Secondary | ICD-10-CM | POA: Diagnosis not present

## 2014-10-08 DIAGNOSIS — Z5181 Encounter for therapeutic drug level monitoring: Secondary | ICD-10-CM | POA: Diagnosis not present

## 2014-10-08 DIAGNOSIS — I509 Heart failure, unspecified: Secondary | ICD-10-CM

## 2014-10-08 DIAGNOSIS — N183 Chronic kidney disease, stage 3 (moderate): Secondary | ICD-10-CM | POA: Diagnosis not present

## 2014-10-08 DIAGNOSIS — J449 Chronic obstructive pulmonary disease, unspecified: Secondary | ICD-10-CM | POA: Diagnosis not present

## 2014-10-08 DIAGNOSIS — I5040 Unspecified combined systolic (congestive) and diastolic (congestive) heart failure: Secondary | ICD-10-CM | POA: Diagnosis not present

## 2014-10-08 NOTE — Patient Outreach (Signed)
[facility]) Care Management  10/08/2014  Alexis Dillon January 31, 1946 [id number]  Call from [place] at MD office. They have called medications into pharmacy, actually had called them in 10/04/14 Also, Patient has appointment on Thursday for hospital follow up. Call to patient home, spoke with patient sister, [name] and discussed medications, she states they were delivered yesterday evening.  Also, she states that MD office called yesterday and scheduled the follow up and that patient niece will be taking her to the appointment on Thursday.  [facility] and Therapist due out today for ongoing services.  Plan to continue TOC calls and visit next month. Royetta Crochet. Laymond Purser, RN, BSN, [place] [phone]

## 2014-10-10 ENCOUNTER — Encounter: Payer: Self-pay | Admitting: *Deleted

## 2014-10-10 DIAGNOSIS — I480 Paroxysmal atrial fibrillation: Secondary | ICD-10-CM | POA: Diagnosis not present

## 2014-10-10 DIAGNOSIS — I5040 Unspecified combined systolic (congestive) and diastolic (congestive) heart failure: Secondary | ICD-10-CM | POA: Diagnosis not present

## 2014-10-10 DIAGNOSIS — Z9981 Dependence on supplemental oxygen: Secondary | ICD-10-CM | POA: Diagnosis not present

## 2014-10-10 DIAGNOSIS — J449 Chronic obstructive pulmonary disease, unspecified: Secondary | ICD-10-CM | POA: Diagnosis not present

## 2014-10-10 DIAGNOSIS — N183 Chronic kidney disease, stage 3 (moderate): Secondary | ICD-10-CM | POA: Diagnosis not present

## 2014-10-10 DIAGNOSIS — Z5181 Encounter for therapeutic drug level monitoring: Secondary | ICD-10-CM | POA: Diagnosis not present

## 2014-10-10 DIAGNOSIS — I129 Hypertensive chronic kidney disease with stage 1 through stage 4 chronic kidney disease, or unspecified chronic kidney disease: Secondary | ICD-10-CM | POA: Diagnosis not present

## 2014-10-10 DIAGNOSIS — D649 Anemia, unspecified: Secondary | ICD-10-CM | POA: Diagnosis not present

## 2014-10-10 DIAGNOSIS — E785 Hyperlipidemia, unspecified: Secondary | ICD-10-CM | POA: Diagnosis not present

## 2014-10-11 ENCOUNTER — Other Ambulatory Visit: Payer: Self-pay | Admitting: Licensed Clinical Social Worker

## 2014-10-11 DIAGNOSIS — Z9981 Dependence on supplemental oxygen: Secondary | ICD-10-CM | POA: Diagnosis not present

## 2014-10-11 DIAGNOSIS — I5040 Unspecified combined systolic (congestive) and diastolic (congestive) heart failure: Secondary | ICD-10-CM | POA: Diagnosis not present

## 2014-10-11 DIAGNOSIS — I480 Paroxysmal atrial fibrillation: Secondary | ICD-10-CM | POA: Diagnosis not present

## 2014-10-11 DIAGNOSIS — E785 Hyperlipidemia, unspecified: Secondary | ICD-10-CM | POA: Diagnosis not present

## 2014-10-11 DIAGNOSIS — Z5181 Encounter for therapeutic drug level monitoring: Secondary | ICD-10-CM | POA: Diagnosis not present

## 2014-10-11 DIAGNOSIS — D649 Anemia, unspecified: Secondary | ICD-10-CM | POA: Diagnosis not present

## 2014-10-11 DIAGNOSIS — N183 Chronic kidney disease, stage 3 (moderate): Secondary | ICD-10-CM | POA: Diagnosis not present

## 2014-10-11 DIAGNOSIS — I129 Hypertensive chronic kidney disease with stage 1 through stage 4 chronic kidney disease, or unspecified chronic kidney disease: Secondary | ICD-10-CM | POA: Diagnosis not present

## 2014-10-11 DIAGNOSIS — J449 Chronic obstructive pulmonary disease, unspecified: Secondary | ICD-10-CM | POA: Diagnosis not present

## 2014-10-11 NOTE — Patient Outreach (Signed)
   Assessment:  CSW received referral on Alexis Dillon. CSW completed chart review on Alexis Dillon on 10/11/14. RN Alexis Dillon communicated to Alexis Dillon on 10/11/14 that Alexis Dillon needed information and assistance related to advanced directives and advanced directives completion.  CSW contacted Alexis Dillon at [place] office on 10/11/14 and asked Alexis Dillon to please mail an advanced directive packet to Alexis Dillon for her to review.  Alexis Dillon said he would mail advanced directives packet to Alexis Dillon on 10/11/14. CSW called Alexis Dillon home phone number on 10/11/14.but was not able to speak via phone with Alexis Dillon.  CSW did leave a phone message for Alexis Dillon and in phone message asked Alexis Dillon to please return call to Alexis Dillon at [phone] to discuss advanced directives preparation with Alexis Dillon.  CSW is awaiting return call from Alexis Dillon.   Plan: Alexis Dillon to communicate with RN Alexis Dillon as needed to discuss nursing needs of Alexis Dillon Alexis Dillon to communicate with primary doctor as needed to discuss medical needs of Alexis Dillon. Alexis Dillon to read/review advanced directives booklet mailed to Alexis Dillon. Alexis Dillon to communicate with CSW within next 30 days in discussing advanced directives preparation for Alexis Dillon. If Alexis Dillon does not return call to CSW in two business days, then CSW will again call Alexis Dillon next week to attempt to speak with Alexis Dillon about needs of Alexis Dillon and about advanced directives preparation for Alexis Dillon.  Alexis Dillon MSW, LCSW Licensed Clinical Social Worker [facility] Care Management [phone]

## 2014-10-17 DIAGNOSIS — Z720 Tobacco use: Secondary | ICD-10-CM | POA: Diagnosis not present

## 2014-10-17 DIAGNOSIS — F329 Major depressive disorder, single episode, unspecified: Secondary | ICD-10-CM | POA: Diagnosis not present

## 2014-10-17 DIAGNOSIS — I110 Hypertensive heart disease with heart failure: Secondary | ICD-10-CM | POA: Diagnosis not present

## 2014-10-17 DIAGNOSIS — I25119 Atherosclerotic heart disease of native coronary artery with unspecified angina pectoris: Secondary | ICD-10-CM | POA: Diagnosis not present

## 2014-10-17 DIAGNOSIS — I4891 Unspecified atrial fibrillation: Secondary | ICD-10-CM | POA: Diagnosis not present

## 2014-10-17 DIAGNOSIS — M1990 Unspecified osteoarthritis, unspecified site: Secondary | ICD-10-CM | POA: Diagnosis not present

## 2014-10-17 DIAGNOSIS — J449 Chronic obstructive pulmonary disease, unspecified: Secondary | ICD-10-CM | POA: Diagnosis not present

## 2014-10-29 DIAGNOSIS — J449 Chronic obstructive pulmonary disease, unspecified: Secondary | ICD-10-CM | POA: Diagnosis not present

## 2014-11-03 DIAGNOSIS — M1990 Unspecified osteoarthritis, unspecified site: Secondary | ICD-10-CM | POA: Diagnosis not present

## 2014-11-03 DIAGNOSIS — I25119 Atherosclerotic heart disease of native coronary artery with unspecified angina pectoris: Secondary | ICD-10-CM | POA: Diagnosis not present

## 2014-11-03 DIAGNOSIS — I509 Heart failure, unspecified: Secondary | ICD-10-CM | POA: Diagnosis not present

## 2014-11-03 DIAGNOSIS — J449 Chronic obstructive pulmonary disease, unspecified: Secondary | ICD-10-CM | POA: Diagnosis not present

## 2014-11-05 ENCOUNTER — Other Ambulatory Visit: Payer: Self-pay | Admitting: *Deleted

## 2014-11-05 VITALS — BP 92/60 | HR 82 | Resp 20 | Wt 87.0 lb

## 2014-11-05 DIAGNOSIS — I509 Heart failure, unspecified: Secondary | ICD-10-CM

## 2014-11-05 NOTE — Patient Outreach (Signed)
[facility]) Care Management   11/05/2014  Alexis Dillon 1946/05/07 [id number]  Alexis Dillon is an 69 y.o. female  Subjective:  Patient reports she is doing ok, she has seen primary care doctor.  Patient discussing she and sister are not getting along today, discussing moving elsewhere but does not want to move to "nursing home" but maybe consider Assistive Living or apartment. She is not sure what she is able to do as she is dependent for medication management and financial management.  Has scale from Medicare, weighs daily Patient not wearing oxygen during visit. Patient states she continues to smoke 1/2 ppd, not sure about quitting but reports she is open to reviewing EMMI education tips on quitting, states she will need sister to assist with review as she cannot read very well.    Objective:   Review of Systems  Constitutional: Negative.   HENT: Negative.   Eyes: Negative.   Respiratory: Positive for shortness of breath and wheezing.        Uses breathing treatments to help with SOB that she has in am Wheezes throughout lungs  Cardiovascular: Negative.   Gastrointestinal: Negative.   Musculoskeletal: Negative.   Skin: Negative.   Neurological: Positive for dizziness.       Reports occasional dizziness with sinus/allergy symptoms  Psychiatric/Behavioral: Negative.     Physical Exam  Constitutional: She is oriented to person, place, and time. She appears well-developed.  Neck: Normal range of motion.  Cardiovascular: Normal rate.   Respiratory: Effort normal. She has wheezes.  GI: Soft. Bowel sounds are normal.  Musculoskeletal: Normal range of motion.  Neurological: She is alert and oriented to person, place, and time.  Skin: Skin is warm and dry.  Psychiatric: She has a normal mood and affect.    Current Medications:   Current Outpatient Prescriptions  Medication Sig Dispense Refill  . acetaminophen (TYLENOL) 325 MG tablet Take 2 tablets (650 mg  total) by mouth every 6 (six) hours as needed for mild pain or fever.    Marland Kitchen albuterol (PROVENTIL HFA;VENTOLIN HFA) 108 (90 BASE) MCG/ACT inhaler Inhale 2 puffs into the lungs every 6 (six) hours as needed for wheezing or shortness of breath.    Marland Kitchen albuterol (PROVENTIL) (2.5 MG/3ML) 0.083% nebulizer solution Take 3 mLs (2.5 mg total) by nebulization every 2 (two) hours as needed for wheezing or shortness of breath. 75 mL 12  . ALPRAZolam (XANAX) 0.25 MG tablet Take 0.25 mg by mouth 3 (three) times daily as needed for anxiety.     Marland Kitchen amLODipine (NORVASC) 2.5 MG tablet Take 1 tablet (2.5 mg total) by mouth daily. 30 tablet 6  . aspirin EC 81 MG tablet Take 1 tablet (81 mg total) by mouth daily. 30 tablet 6  . atorvastatin (LIPITOR) 80 MG tablet Take 80 mg by mouth daily.    . carvedilol (COREG) 6.25 MG tablet Take 1 tablet (6.25 mg total) by mouth 2 (two) times daily with a meal. 60 tablet 6  . cholecalciferol (VITAMIN D) 1000 UNITS tablet Take 2,000 Units by mouth daily.    . furosemide (LASIX) 20 MG tablet Take 2 tablets (40 mg total) by mouth daily. 30 tablet 12  . HYDROcodone-acetaminophen (NORCO/VICODIN) 5-325 MG per tablet Take 1 tablet by mouth 2 (two) times daily as needed for moderate pain.     Marland Kitchen ipratropium-albuterol (DUONEB) 0.5-2.5 (3) MG/3ML SOLN Take 3 mLs by nebulization every 6 (six) hours as needed (SHORTNESS OF BREATH).    Marland Kitchen isosorbide  mononitrate (IMDUR) 30 MG 24 hr tablet Take 1 tablet (30 mg total) by mouth daily. 30 tablet 6  . NITROSTAT 0.4 MG SL tablet Take 1 tablet by mouth daily as needed for chest pain.     . pantoprazole (PROTONIX) 40 MG tablet Take 40 mg by mouth daily.    . potassium chloride (K-DUR) 10 MEQ tablet Take 1 tablet (10 mEq total) by mouth 2 (two) times daily. 60 tablet 12  . methylPREDNISolone (MEDROL DOSEPAK) 4 MG TBPK tablet Take as directed (Patient not taking: Reported on 10/07/2014) 21 tablet 0   No current facility-administered medications for this visit.     Functional Status:   In your present state of health, do you have any difficulty performing the following activities: 10/07/2014 09/23/2014  Hearing? - N  Vision? - N  Difficulty concentrating or making decisions? - N  Walking or climbing stairs? - N  Dressing or bathing? - N  Doing errands, shopping? - N  Conservation officer, nature and eating ? N -  Using the Toilet? N -  In the past six months, have you accidently leaked urine? Y -  Do you have problems with loss of bowel control? N -  Managing your Medications? Y -  Managing your Finances? Y -  Housekeeping or managing your Housekeeping? N -    Fall/Depression Screening:    PHQ 2/9 Scores 10/07/2014  PHQ - 2 Score 0    Assessment:   Education on oxygen usage-continuous Reviewed with patient signs and symptoms of HF to self monitor  Reviewed transportation through [employer] and Medicaid Reviewed information on EMMI handouts, instructed patient to have sister review them with her as patient has limited reading ability  Plan:  SW referral, transportation and possible ALF or Apartment Visit again next month to follow up HF and smoking education  Royetta Crochet. Laymond Purser, RN, BSN, [place] [phone]  [facility] CM Care Plan Problem One        Patient Outreach from 11/05/2014 in [place] Problem One  knowledge deficit related to HF diagnosis   Care Plan for Problem One  Active   THN CM Short Term Goal #1 (0-30 days)  Patient will not be hopsitalized for CHF in the next 30 days   THN CM Short Term Goal #1 Start Date  10/07/14   Interventions for Short Term Goal #1  using teachback method reviewed symptoms of CHF with patient   THN CM Short Term Goal #2 (0-30 days)  Patient will keep a weight log over the next 30 days   THN CM Short Term Goal #2 Start Date  10/07/14   Interventions for Short Term Goal #2  Using teachback method, reviewed weight log and requested patient log daily weights and take to  medical appointments     [facility] CM Care Plan Problem Two        Patient Outreach from 11/05/2014 in [place] Problem Two  Knowledge deficit related to COPD diagnosis   Care Plan for Problem Two  Active   Interventions for Problem Two Long Term Goal   using teachback method gave patient EMMI "tips on how to quit" for patient sister to review with her   THN Long Term Goal (31-90) days  Patient will consider stopping smoking within the next 90 days   THN Long Term Goal Start Date  10/07/14   [facility] CM Short Term Goal #1 (0-30  days)  Patient will be compliant with wearing oxygen over the next 30 days   THN CM Short Term Goal #1 Start Date  11/05/14 Billy Fischer goal as patient not wearing oxygen during visit]   Interventions for Short Term Goal #2   Using teachback method, encouraged patient to wear oxygen continuous as prescribed    [facility] CM Care Plan Problem Three        Patient Outreach from 11/05/2014 in [place] Problem Three  Client needs assistance in preparing advanced directives

## 2014-11-06 ENCOUNTER — Other Ambulatory Visit: Payer: Self-pay | Admitting: Licensed Clinical Social Worker

## 2014-11-06 NOTE — Patient Outreach (Signed)
  Assessment: CSW called client on 11/06/14.  CSW verified identity of client.  CSW spoke with Alexis Dillon on 11/06/14 about current needs of client.  Alexis Dillon said she resides at home with assistance from her sister and with assistance from her nephew.  She said her nephew cooks frequently and thus Alexis Dillon said she is eating well and has adequate food supply.  CSW spoke with Alexis Dillon about advanced directives documents ([place] and Living Will).  Alexis Dillon said she had looked at these documents with some assistance from others and had requested that her sister also review these blank documents.  She is waiting for her sister to review blank advanced directive documents. CSW described to client that she could, with assistance, begin to complete advanced directive documents of choice and then take these documents to be signed by client in presence of a  notary and two witnesses. Client said she is waiting on her sister to review advanced directive documents.  Client said her nephew is helping her with medication administration at present.  CSW talked with client about Aging, Disability and Transient Services in [place], [place]. [place] informed client regarding transport assistance available through [employer].  Client said she already had phone number for [employer]; CSW encouraged her and a family member to call [employer] to have client's name added to [employer] transport assistance list through [facility].  CSW informed client that once her name has been added to [employer]TS transport list, then client can request transport through [facility] and ask that transport services for client be billed through client's Medicaid benefit. CSW informed client that client would have to call [employer] at least 5 days ahead of scheduled appointment and request [facility] transport for client to and from appointment through client's Medicaid benefit.  CSW encouraged client to utilize her Medicaid benefit in this way to help her cover  costs of needed transport to and from her scheduled medical appointments. Client affirmed that she had phone number of [employer] and would get a family member to help her call [employer] to addl her name to [facility] transport list.  Client said she did need assistance with reading, financial management, and medication management.  CSW and client spoke of fact that occasionally client and her sister argue over certain things. Client did not indicate to [place] on 11/06/14 an interest in seeking other place to live for client at present.  CSW to speak further with client in next phone call regarding current living situation and needs of client regarding her living situation.   Plan: Client to take medications as prescribed and to attend scheduled medical appointments. Client to communicate with RN Burgess Amor regarding nursing needs of client. CSW to call client in two weeks to further assess needs of client at that time.   Alexis Dillon.Carlester Kasparek MSW, LCSW Licensed Clinical Social Worker [facility] Care Management [phone]

## 2014-11-06 NOTE — Patient Outreach (Signed)
[facility]) Care Management  11/06/2014  Rosilyn Coachman February 24, 1946 [id number]   Request from Burgess Amor, RN to assign Social Worker, assigned [employer]  Ronnell Freshwater. [place], [place] Management [place] Assistant Phone: [phone] Fax: [phone]

## 2014-11-28 DIAGNOSIS — J449 Chronic obstructive pulmonary disease, unspecified: Secondary | ICD-10-CM | POA: Diagnosis not present

## 2014-12-03 ENCOUNTER — Other Ambulatory Visit: Payer: Self-pay | Admitting: *Deleted

## 2014-12-03 DIAGNOSIS — I509 Heart failure, unspecified: Secondary | ICD-10-CM | POA: Diagnosis not present

## 2014-12-03 DIAGNOSIS — J449 Chronic obstructive pulmonary disease, unspecified: Secondary | ICD-10-CM | POA: Diagnosis not present

## 2014-12-03 DIAGNOSIS — M1990 Unspecified osteoarthritis, unspecified site: Secondary | ICD-10-CM | POA: Diagnosis not present

## 2014-12-03 DIAGNOSIS — I25119 Atherosclerotic heart disease of native coronary artery with unspecified angina pectoris: Secondary | ICD-10-CM | POA: Diagnosis not present

## 2014-12-03 NOTE — Patient Outreach (Signed)
[facility]) Care Management   12/03/2014  Alexis Dillon 02/23/1946 [id number]  Alexis Dillon is an 69 y.o. female  Visit completed with patient and her nephew, Alexis Dillon  Subjective:  Patient reporting she is doing pretty good today. Patient reports she had a bout of shortness of breath yesterday, used breathing treatment and put on oxygen and it helped relieve her shortness of breath. Nephew reports he told patient to do a breathing treatment and put on oxygen to relieve the shortness of breath.  Nephew voices concerns over patient not caring for her self and following HF guidelines for fluid and sodium restrictions.  Nephew voices concerns that patient caregiver depending more on him to assist with patient care. Will consider looking into PCS program or CAP services.  Nephew assists patient with medication management, would appreciate new medication box  Nephew reports patient has made the statement that she wants to go to the hospital "to get a vacation" and she thinks that the nursing home is a vacation "because they wait on her" there.  Patient reports she has said that and has considered moving out to an apartment, but nephew states that patient sister is guardian for patient and that patient would not be able to live alone as she could not be completely independent due to deficits and that she has lived dependent all of her life due to her disability.   Objective:   BP 104/70 mmHg  Pulse 66  Resp 20  Ht 1.575 m (_0 )  Wt 90 lb (40.824 kg)  BMI 16.46 kg/m2  SpO2 98% Review of Systems  Constitutional: Negative.   HENT: Negative.   Eyes: Negative.   Respiratory: Positive for shortness of breath.        Reports an episode of Shortness of Breath over weekend, resolved with nebulizer treatment and wearing oxygen  Cardiovascular: Negative.   Gastrointestinal: Negative.   Genitourinary: Positive for frequency.  Skin: Negative.   Neurological: Negative.    Psychiatric/Behavioral: Negative.     Physical Exam  Constitutional: She is oriented to person, place, and time. She appears well-developed.  Neck: Normal range of motion.  Cardiovascular: Normal rate.   Respiratory: Effort normal and breath sounds normal.  GI: Soft. Bowel sounds are normal.  Musculoskeletal: Normal range of motion.  Right knee pain, slight swelling around knee  Neurological: She is alert and oriented to person, place, and time.  Skin: Skin is warm.    Current Medications:   Current Outpatient Prescriptions  Medication Sig Dispense Refill  . acetaminophen (TYLENOL) 325 MG tablet Take 2 tablets (650 mg total) by mouth every 6 (six) hours as needed for mild pain or fever.    Marland Kitchen albuterol (PROVENTIL) (2.5 MG/3ML) 0.083% nebulizer solution Take 3 mLs (2.5 mg total) by nebulization every 2 (two) hours as needed for wheezing or shortness of breath. 75 mL 12  . ALPRAZolam (XANAX) 0.25 MG tablet Take 0.25 mg by mouth 3 (three) times daily as needed for anxiety.     Marland Kitchen amLODipine (NORVASC) 2.5 MG tablet Take 1 tablet (2.5 mg total) by mouth daily. 30 tablet 6  . aspirin EC 81 MG tablet Take 1 tablet (81 mg total) by mouth daily. 30 tablet 6  . atorvastatin (LIPITOR) 80 MG tablet Take 80 mg by mouth daily.    . carvedilol (COREG) 6.25 MG tablet Take 1 tablet (6.25 mg total) by mouth 2 (two) times daily with a meal. 60 tablet 6  . cholecalciferol (VITAMIN D)  1000 UNITS tablet Take 2,000 Units by mouth daily.    . furosemide (LASIX) 20 MG tablet Take 2 tablets (40 mg total) by mouth daily. 30 tablet 12  . ipratropium-albuterol (DUONEB) 0.5-2.5 (3) MG/3ML SOLN Take 3 mLs by nebulization every 6 (six) hours as needed (SHORTNESS OF BREATH).    Marland Kitchen isosorbide mononitrate (IMDUR) 30 MG 24 hr tablet Take 1 tablet (30 mg total) by mouth daily. 30 tablet 6  . NITROSTAT 0.4 MG SL tablet Take 1 tablet by mouth daily as needed for chest pain.     . pantoprazole (PROTONIX) 40 MG tablet Take 40  mg by mouth daily.    . potassium chloride (K-DUR) 10 MEQ tablet Take 1 tablet (10 mEq total) by mouth 2 (two) times daily. 60 tablet 12  . sertraline (ZOLOFT) 50 MG tablet Take 50 mg by mouth.    Marland Kitchen albuterol (PROVENTIL HFA;VENTOLIN HFA) 108 (90 BASE) MCG/ACT inhaler Inhale 2 puffs into the lungs every 6 (six) hours as needed for wheezing or shortness of breath.    Marland Kitchen HYDROcodone-acetaminophen (NORCO/VICODIN) 5-325 MG per tablet Take 1 tablet by mouth 2 (two) times daily as needed for moderate pain.     . methylPREDNISolone (MEDROL DOSEPAK) 4 MG TBPK tablet Take as directed (Patient not taking: Reported on 10/07/2014) 21 tablet 0   No current facility-administered medications for this visit.    Functional Status:   In your present state of health, do you have any difficulty performing the following activities: 11/05/2014 10/07/2014  Hearing? N -  Vision? Y -  Difficulty concentrating or making decisions? Y -  Walking or climbing stairs? Y -  Dressing or bathing? N -  Doing errands, shopping? Y -  Conservation officer, nature and eating ? - N  Using the Toilet? - N  In the past six months, have you accidently leaked urine? - Y  Do you have problems with loss of bowel control? - N  Managing your Medications? - Y  Managing your Finances? - Y  Housekeeping or managing your Housekeeping? - N    Fall/Depression Screening:    PHQ 2/9 Scores 12/03/2014 10/07/2014  PHQ - 2 Score 0 0   Fall Risk  12/03/2014 10/07/2014  Falls in the past year? No No  Risk for fall due to : Impaired balance/gait -    Assessment:   Needs new medication box Needs new scale as not adherent to [employer] HF scale program. Needs re-education on HF, COPD, oxygen usage Education given on HF and COPD to both patient and nephew RNCM briefly discussed Medicaid benefits around [facility] program and CAP for the family to consider.  Plan:   Send MD update Visit again next month, bring scale and medication box Send update to [place] to follow up with patient regarding questions on Medicaid PCS/CAP services.   Royetta Crochet. Laymond Purser, RN, BSN, [place] [phone]  [facility] CM Care Plan Problem One        Patient Outreach from 12/03/2014 in [place] Problem One  knowledge deficit related to HF diagnosis   Care Plan for Problem One  Active   THN CM Short Term Goal #1 (0-30 days)  Patient will not be hopsitalized for CHF in the next 30 days   THN CM Short Term Goal #1 Start Date  10/07/14   [facility] CM Short Term Goal #1 Met Date  12/03/14   Interventions for Short Term  Goal #1  using teachback method reviewed CHF zones with both patient and nephew   THN CM Short Term Goal #2 (0-30 days)  Patient will keep a weight log over the next 30 days   THN CM Short Term Goal #2 Start Date  12/03/14 [facility] ]   Interventions for Short Term Goal #2  Using teachback method, showed daily weight log to nephew, discussed keeping record to take to appointments    [place] Problem Two        Patient Outreach from 12/03/2014 in [place] Problem Two  Knowledge deficit related to COPD diagnosis   Care Plan for Problem Two  Active   Interventions for Problem Two Long Term Goal   using teachback method encouraged patient to consider quitting smoking   THN Long Term Goal (31-90) days  Patient will consider stopping smoking within the next 90 days   THN Long Term Goal Start Date  10/07/14   THN CM Short Term Goal #1 (0-30 days)  Patient will be compliant with wearing oxygen over the next 30 days   THN CM Short Term Goal #1 Start Date  11/05/14 Billy Fischer goal as patient not wearing oxygen during visit]   Interventions for Short Term Goal #2   using teachback method reviewed importance of wearing oxygen, reviewed the COPD zones in the [facility] calendar with both patient and nephew    [place] Problem Three        Patient Outreach Telephone from 11/06/2014  in [place] Problem Three  Client needs transport support assistance to medical appointments   Care Plan for Problem Three  Active   THN CM Short Term Goal #1 (0-30 days)  Client and family representative will call [employer] in the next 30 days and add client's name to [place] transport list   THN CM Short Term Goal #1 Start Date  11/06/14   [facility] CM Short Term Goal #1 Met Date  11/06/14   Interventions for Short Term Goal #1  CSW and client spoke of [employer]TS support services for client.  CSW encouraged client on 11/06/14 to contact [facility] to add her name to [place] transport list.

## 2014-12-04 ENCOUNTER — Other Ambulatory Visit: Payer: Self-pay | Admitting: Licensed Clinical Social Worker

## 2014-12-04 ENCOUNTER — Encounter: Payer: Self-pay | Admitting: *Deleted

## 2014-12-04 NOTE — Patient Outreach (Signed)
Assessment: CSW called home phone number of client on 12/04/14.  CSW was not able to speak via phone with client on 12/04/14.  However, CSW was able to speak via phone on 12/04/14  with [name], sister and emergency contact listed for client, regarding current needs of client  CSW verified identity of [name]. CSW spoke with [name] about PCS (personal care service) application process. CSW encouraged [name] to contact Dr. Luan Pulling and request that Dr. Luan Pulling please complete PCS (personal care service) application for client and fax application to [facility] in [place], [place].  Also, CSW spoke with [name] about CAP  (community assistance program).  CSW provided [facility] with phone number of [place] in [place], [place] [phone]).  CSW encouraged [name] to call number given for Department of Social Services in [place], [place] and to speak with a CAP worker regarding CAP application via phone for client.  CSW informed [name] that CAP worker could give [facility] list of data items to have ready to provide when [facility] calls in to complete CAP application for client via phone.  [name] wrote down above information provided to her by CSW. She said she understood this information regarding PCS support and CAP support for client.  CSW also mentioned to [facility] that CAP services usually have a waiting list.  CSW informed [name] that CAP services, if applied for and received for client, could be a more long term solution for in home care for client.  CSW gave [name] [facility] CSW number of [phone] and encouraged client or [name] to call CSW as needed to discuss social work needs of client.  Plan: Client to take medications as prescribed and attend scheduled medical appointments. [name], sister of client, to contact Dr. Luan Pulling to request that Dr. Luan Pulling complete PCS application for client and fax application to [facility] in [place], [place]. [name], sister of client, to contact Department of Social Services  in [facility] to inquire about CAP application process for client. CSW to call client/[name] Tamala Julian in three weeks to assess needs of client at that time.  Norva Riffle.Miara Emminger MSW, LCSW Licensed Clinical Social Worker [facility] Care Management [phone]

## 2014-12-17 DIAGNOSIS — I10 Essential (primary) hypertension: Secondary | ICD-10-CM | POA: Diagnosis not present

## 2014-12-17 DIAGNOSIS — I129 Hypertensive chronic kidney disease with stage 1 through stage 4 chronic kidney disease, or unspecified chronic kidney disease: Secondary | ICD-10-CM | POA: Diagnosis not present

## 2014-12-17 DIAGNOSIS — J449 Chronic obstructive pulmonary disease, unspecified: Secondary | ICD-10-CM | POA: Diagnosis not present

## 2014-12-17 DIAGNOSIS — I110 Hypertensive heart disease with heart failure: Secondary | ICD-10-CM | POA: Diagnosis not present

## 2014-12-19 ENCOUNTER — Encounter: Payer: Self-pay | Admitting: *Deleted

## 2014-12-19 ENCOUNTER — Other Ambulatory Visit: Payer: Self-pay | Admitting: *Deleted

## 2014-12-19 NOTE — Patient Outreach (Signed)
Alexis Dillon) Care Management   12/19/2014  Alexis Dillon 06-17-1945 [id number]  Alexis Dillon is an 69 y.o. female  Subjective:  Patient saw Dr. Luan Pulling this week Patient complains of back pain, knee pain Dr. Rockey Situ patient to limit soda to one can a day Also, Dr. Luan Pulling gave Chantix to help with smoking cessation, patient still smoking "just a few a day" Patient has not been weighing stating "don't know how", not keeping weight log either, states weight was 88# at MD office. Dr. Luan Pulling encouraged patient to limit soda intake to 1 can daily Patient nephew to fill medbox for patient  Objective:    Filed Vitals:   12/19/14 1324  BP: 108/65  Pulse: 70  Resp: 20   Review of Systems  HENT: Negative.   Respiratory: Negative.   Cardiovascular: Negative.   Gastrointestinal: Negative.   Genitourinary: Negative.   Musculoskeletal: Positive for back pain and joint pain.       Right knee  Skin: Negative.   Neurological: Negative.   Endo/Heme/Allergies: Negative.   Psychiatric/Behavioral: Negative.     Physical Exam  Constitutional: She is oriented to person, place, and time.  Neck: Normal range of motion.  Cardiovascular: Normal rate and regular rhythm.   Respiratory: Effort normal and breath sounds normal.  GI: Soft.  Neurological: She is alert and oriented to person, place, and time.  Skin: Skin is warm and dry.    Current Medications:   Current Outpatient Prescriptions  Medication Sig Dispense Refill  . acetaminophen (TYLENOL) 325 MG tablet Take 2 tablets (650 mg total) by mouth every 6 (six) hours as needed for mild pain or fever.    Marland Kitchen albuterol (PROVENTIL HFA;VENTOLIN HFA) 108 (90 BASE) MCG/ACT inhaler Inhale 2 puffs into the lungs every 6 (six) hours as needed for wheezing or shortness of breath.    Marland Kitchen albuterol (PROVENTIL) (2.5 MG/3ML) 0.083% nebulizer solution Take 3 mLs (2.5 mg total) by nebulization every 2 (two) hours as needed for wheezing or  shortness of breath. 75 mL 12  . ALPRAZolam (XANAX) 0.25 MG tablet Take 0.25 mg by mouth 3 (three) times daily as needed for anxiety.     Marland Kitchen amLODipine (NORVASC) 2.5 MG tablet Take 1 tablet (2.5 mg total) by mouth daily. 30 tablet 6  . aspirin EC 81 MG tablet Take 1 tablet (81 mg total) by mouth daily. 30 tablet 6  . atorvastatin (LIPITOR) 80 MG tablet Take 80 mg by mouth daily.    . carvedilol (COREG) 6.25 MG tablet Take 1 tablet (6.25 mg total) by mouth 2 (two) times daily with a meal. 60 tablet 6  . cholecalciferol (VITAMIN D) 1000 UNITS tablet Take 2,000 Units by mouth daily.    . Fluticasone-Salmeterol (ADVAIR) 250-50 MCG/DOSE AEPB Inhale 1 puff into the lungs 2 (two) times daily.    . furosemide (LASIX) 20 MG tablet Take 2 tablets (40 mg total) by mouth daily. 30 tablet 12  . HYDROcodone-acetaminophen (NORCO/VICODIN) 5-325 MG per tablet Take 1 tablet by mouth 2 (two) times daily as needed for moderate pain.     Marland Kitchen ipratropium-albuterol (DUONEB) 0.5-2.5 (3) MG/3ML SOLN Take 3 mLs by nebulization every 6 (six) hours as needed (SHORTNESS OF BREATH).    Marland Kitchen isosorbide mononitrate (IMDUR) 30 MG 24 hr tablet Take 1 tablet (30 mg total) by mouth daily. 30 tablet 6  . NITROSTAT 0.4 MG SL tablet Take 1 tablet by mouth daily as needed for chest pain.     Marland Kitchen  pantoprazole (PROTONIX) 40 MG tablet Take 40 mg by mouth daily.    . potassium chloride (K-DUR) 10 MEQ tablet Take 1 tablet (10 mEq total) by mouth 2 (two) times daily. 60 tablet 12  . sertraline (ZOLOFT) 50 MG tablet Take 50 mg by mouth.    . varenicline (CHANTIX PAK) 0.5 MG X 11 & 1 MG X 42 tablet Take by mouth 2 (two) times daily. Take one 0.5 mg tablet by mouth once daily for 3 days, then increase to one 0.5 mg tablet twice daily for 4 days, then increase to one 1 mg tablet twice daily.    . methylPREDNISolone (MEDROL DOSEPAK) 4 MG TBPK tablet Take as directed (Patient not taking: Reported on 10/07/2014) 21 tablet 0   No current facility-administered  medications for this visit.    Functional Status:   In your present state of health, do you have any difficulty performing the following activities: 12/19/2014 11/05/2014  Hearing? - N  Vision? - Y  Difficulty concentrating or making decisions? - Y  Walking or climbing stairs? - Y  Dressing or bathing? - N  Doing errands, shopping? - Y  Preparing Food and eating ? N -  Using the Toilet? N -  In the past six months, have you accidently leaked urine? Y -  Do you have problems with loss of bowel control? N -  Managing your Medications? Y -  Managing your Finances? Y -  Housekeeping or managing your Housekeeping? N -    Fall/Depression Screening:    Fall Risk  12/03/2014 10/07/2014  Falls in the past year? No No  Risk for fall due to : Impaired balance/gait -   PHQ 2/9 Scores 12/03/2014 10/07/2014  PHQ - 2 Score 0 0    Assessment:   Delivered med box Needs instruction on [facility] tele scale-instructed on how to use scale, the pad battery was low and would not turn on to review questions,  Encouraged patient to weigh daily and keeping weight log Reviewed MD instructions and encouraged patient in adherence to instructions.  Plan: Patient to call MD if back pain not better by early next week or worsens Patient to continue to attempt to quick smoking with Chantix Will review the tele health scale pad  next visit Send MD an update Visit in September Alexis Dillon E. Laymond Purser, RN, BSN, [place] [phone]

## 2014-12-25 ENCOUNTER — Other Ambulatory Visit: Payer: Self-pay | Admitting: Licensed Clinical Social Worker

## 2014-12-25 NOTE — Patient Outreach (Signed)
Assessment: CSW called client on 12/25/14.  CSW verified identity of client.  CSW and Aneli spoke of current needs of client.  Client said she was eating well. She said she lived at home of her sister, Alexis Dillon. Client said that her nephew also lives at home of sister. Nephew enjoys cooking as a hobby. Client said that her nephew often cooks meals and that she and other family members share these meals.  Client said she is sleeping well and that she has her prescribed medications. She said that she uses inhaler as needed.  She said she has oxygen in the home and uses oxygen at 2 liters via nasal canula as needed to help her in breathing well. She said she uses oxygen not continually but only as needed in the home. CSW and client spoke of her mood. She said she gets a little sad sometimes. CSW reminded client that she could talk with Dr. Luan Pulling, her primary doctor, about sadness/depression symptoms and that Dr. Luan Pulling was aware of several counseling agencies in the area.  She could talk with Dr. Luan Pulling about sadness/depression symptoms to see if Dr. Luan Pulling wanted to make a referral for client or not to local counseling agency.  She said she does well with her activities of daily living.  She said she takes sponge baths regularly. She said she has a large tub and is afraid she might fall through use of tub; thus, she said she takes sponge baths regularly.  She said she has great support from her sister, Alexis Dillon.  She said she is scheduled to see Dr. Luan Pulling for a routine appointment next month.  Client said she becomes short of breath occasionally. She said she has to allow time to rest when she is short of breath. CSW thanked client for phone call on 12/25/14 and invited Alexis Dillon to call CSW at [phone] as needed to discuss social work needs of client.  Plan: Client to take medications as prescribed and attend scheduled medical appointments. Client to communicate, as needed, with RN Alexis Dillon to  discuss nursing needs of client. CSW to call client in three weeks to assess needs of client at that time.  Alexis Dillon.Alexis Dillon MSW, LCSW Licensed Clinical Social Worker [facility] Care Management [phone]

## 2014-12-29 DIAGNOSIS — J449 Chronic obstructive pulmonary disease, unspecified: Secondary | ICD-10-CM | POA: Diagnosis not present

## 2015-01-03 DIAGNOSIS — J449 Chronic obstructive pulmonary disease, unspecified: Secondary | ICD-10-CM | POA: Diagnosis not present

## 2015-01-03 DIAGNOSIS — I509 Heart failure, unspecified: Secondary | ICD-10-CM | POA: Diagnosis not present

## 2015-01-03 DIAGNOSIS — M1990 Unspecified osteoarthritis, unspecified site: Secondary | ICD-10-CM | POA: Diagnosis not present

## 2015-01-03 DIAGNOSIS — I25119 Atherosclerotic heart disease of native coronary artery with unspecified angina pectoris: Secondary | ICD-10-CM | POA: Diagnosis not present

## 2015-01-16 ENCOUNTER — Other Ambulatory Visit: Payer: Self-pay | Admitting: *Deleted

## 2015-01-16 NOTE — Patient Outreach (Signed)
[facility]) Care Management   01/16/2015  Alexis Dillon [birth year] [id number]  Alexis Dillon is an 69 y.o. female  Subjective:  Patient reports she is able to weigh daily now that she does not have to use the question pad and is using just the scale. Patient states weight stable at 88# Patient is logging weights in blue [facility] calendar. Patient states nephew still managing medications for her  Patient states she is still considering getting an apartment or moving to [place]. She knows she cannot live alone, would need some assistance.  Patient does not like idea of Assisted Living as she does not want to give up her income. She would welcome information from [facility] SW  Patient did not use the chantix, she states she will try to cut back or quit on own. She is down to 1/2ppd Patient also not using advair inhaler, it "gags me" Patient is wearing oxygen most of the time, except when she is smoking.  Patient still has pain in right knee, will discuss with MD at next appointment   Objective:   BP 104/60 mmHg  Pulse 78  Resp 20  Wt 88 lb (39.917 kg)  SpO2 95% Review of Systems  Constitutional: Negative.   HENT: Negative.   Eyes: Negative.   Respiratory: Negative.   Cardiovascular: Negative.   Gastrointestinal: Negative.   Genitourinary: Negative.   Musculoskeletal: Positive for myalgias.  Skin: Negative.   Neurological: Negative.   Endo/Heme/Allergies: Negative.   Psychiatric/Behavioral: Negative.     Physical Exam  Constitutional: She is oriented to person, place, and time. She appears well-developed and well-nourished.  Cardiovascular: Normal rate and regular rhythm.   GI: Soft.  Musculoskeletal: Normal range of motion.  Patient does use walker  Neurological: She is alert and oriented to person, place, and time.  Skin: Skin is warm and dry.  Psychiatric: She has a normal mood and affect.    Current Medications:   Current Outpatient  Prescriptions  Medication Sig Dispense Refill  . acetaminophen (TYLENOL) 325 MG tablet Take 2 tablets (650 mg total) by mouth every 6 (six) hours as needed for mild pain or fever.    Marland Kitchen albuterol (PROVENTIL HFA;VENTOLIN HFA) 108 (90 BASE) MCG/ACT inhaler Inhale 2 puffs into the lungs every 6 (six) hours as needed for wheezing or shortness of breath.    Marland Kitchen albuterol (PROVENTIL) (2.5 MG/3ML) 0.083% nebulizer solution Take 3 mLs (2.5 mg total) by nebulization every 2 (two) hours as needed for wheezing or shortness of breath. 75 mL 12  . ALPRAZolam (XANAX) 0.25 MG tablet Take 0.25 mg by mouth 3 (three) times daily as needed for anxiety.     Marland Kitchen amLODipine (NORVASC) 2.5 MG tablet Take 1 tablet (2.5 mg total) by mouth daily. 30 tablet 6  . aspirin EC 81 MG tablet Take 1 tablet (81 mg total) by mouth daily. 30 tablet 6  . atorvastatin (LIPITOR) 80 MG tablet Take 80 mg by mouth daily.    . carvedilol (COREG) 6.25 MG tablet Take 1 tablet (6.25 mg total) by mouth 2 (two) times daily with a meal. 60 tablet 6  . cholecalciferol (VITAMIN D) 1000 UNITS tablet Take 2,000 Units by mouth daily.    . furosemide (LASIX) 20 MG tablet Take 2 tablets (40 mg total) by mouth daily. 30 tablet 12  . HYDROcodone-acetaminophen (NORCO/VICODIN) 5-325 MG per tablet Take 1 tablet by mouth 2 (two) times daily as needed for moderate pain.     Marland Kitchen  ipratropium-albuterol (DUONEB) 0.5-2.5 (3) MG/3ML SOLN Take 3 mLs by nebulization every 6 (six) hours as needed (SHORTNESS OF BREATH).    Marland Kitchen isosorbide mononitrate (IMDUR) 30 MG 24 hr tablet Take 1 tablet (30 mg total) by mouth daily. 30 tablet 6  . NITROSTAT 0.4 MG SL tablet Take 1 tablet by mouth daily as needed for chest pain.     . pantoprazole (PROTONIX) 40 MG tablet Take 40 mg by mouth daily.    . potassium chloride (K-DUR) 10 MEQ tablet Take 1 tablet (10 mEq total) by mouth 2 (two) times daily. 60 tablet 12  . sertraline (ZOLOFT) 50 MG tablet Take 50 mg by mouth.    .  Fluticasone-Salmeterol (ADVAIR) 250-50 MCG/DOSE AEPB Inhale 1 puff into the lungs 2 (two) times daily.    . methylPREDNISolone (MEDROL DOSEPAK) 4 MG TBPK tablet Take as directed (Patient not taking: Reported on 10/07/2014) 21 tablet 0  . varenicline (CHANTIX PAK) 0.5 MG X 11 & 1 MG X 42 tablet Take by mouth 2 (two) times daily. Take one 0.5 mg tablet by mouth once daily for 3 days, then increase to one 0.5 mg tablet twice daily for 4 days, then increase to one 1 mg tablet twice daily.     No current facility-administered medications for this visit.    Functional Status:   In your present state of health, do you have any difficulty performing the following activities: 01/16/2015 12/19/2014  Hearing? N -  Vision? N -  Difficulty concentrating or making decisions? Y -  Walking or climbing stairs? Y -  Dressing or bathing? N -  Doing errands, shopping? Y -  Quarry manager and eating ? N N  Using the Toilet? N N  In the past six months, have you accidently leaked urine? Y Y  Do you have problems with loss of bowel control? N N  Managing your Medications? Y Y  Managing your Finances? [name]  Housekeeping or managing your Housekeeping? Y N    Fall/Depression Screening:    Fall Risk  01/16/2015 12/03/2014 10/07/2014  Falls in the past year? No No No  Risk for fall due to : Impaired mobility;Other (Comment) Impaired balance/gait -  Risk for fall due to (comments): dizziness on occasion - -   PHQ 2/9 Scores 01/16/2015 12/03/2014 10/07/2014  PHQ - 2 Score 0 0 0    Assessment:   Reviewed weights Education on low salt diet, discouraged patient eating potato chips Encouraged patient to quit smoking Reviewed medications, patient not using advair inhaler Needs information about apartments  Plan:  Will refer to [facility] SW to assist patient with community resources such as elderly apartment, Assisted Living and CAP and PCS services. Will visit again next month and will discharge patient to [facility].  [facility]  CM Care Plan Problem One        Most Recent Value   Care Plan Problem One  knowledge deficit related to HF diagnosis   Role Documenting the Problem One  Care Management Coordinator   THN CM Short Term Goal #1 (0-30 days)  Patient will follow low salt diet for next 30 days   THN CM Short Term Goal #1 Start Date  01/16/15   Interventions for Short Term Goal #1  Using teachback method reviewed low salt diet  foods, instructed patient to avoid potato chips   THN CM Short Term Goal #2 (0-30 days)  Patient will keep a weight log over the next 30 days  THN CM Short Term Goal #2 Start Date  12/03/14   [facility] CM Short Term Goal #2 Met Date  01/16/15   Interventions for Short Term Goal #2  verified patient able to weigh now with [employer] scale, writing weights in [facility] calendar    [facility] CM Care Plan Problem Two        Most Recent Value   Care Plan Problem Two  Knowledge deficit related to COPD diagnosis   Role Documenting the Problem Two  Care Management [place] for Problem Two  Active   Interventions for Problem Two Long Term Goal   Using teachback method, reinforced need for patient to consider using chantix to assist with stopping smoking   THN Long Term Goal (31-90) days  Patient will consider stopping smoking within the next 90 days   THN Long Term Goal Start Date  10/07/14   THN CM Short Term Goal #1 (0-30 days)  Patient will be compliant with wearing oxygen over the next 30 days   THN CM Short Term Goal #1 Start Date  12/03/14   [facility] CM Short Term Goal #1 Met Date   01/16/15   Interventions for Short Term Goal #2   using teachback method reviewed if patient is using oxygen, reinforced to wear except while smoking      Torian Quintero E. Laymond Purser, RN, BSN, [place] [phone]

## 2015-01-29 DIAGNOSIS — J449 Chronic obstructive pulmonary disease, unspecified: Secondary | ICD-10-CM | POA: Diagnosis not present

## 2015-02-22 ENCOUNTER — Emergency Department (HOSPITAL_COMMUNITY): Payer: Medicare Other

## 2015-02-22 ENCOUNTER — Emergency Department (HOSPITAL_COMMUNITY)
Admission: EM | Admit: 2015-02-22 | Discharge: 2015-02-23 | Disposition: A | Discharge status: Home / Self Care | Payer: Medicare Other | Attending: Emergency Medicine | Admitting: Emergency Medicine

## 2015-02-22 ENCOUNTER — Encounter (HOSPITAL_COMMUNITY): Payer: Self-pay | Admitting: Emergency Medicine

## 2015-02-22 DIAGNOSIS — I2510 Atherosclerotic heart disease of native coronary artery without angina pectoris: Secondary | ICD-10-CM | POA: Diagnosis not present

## 2015-02-22 DIAGNOSIS — Z9889 Other specified postprocedural states: Secondary | ICD-10-CM | POA: Insufficient documentation

## 2015-02-22 DIAGNOSIS — M1990 Unspecified osteoarthritis, unspecified site: Secondary | ICD-10-CM | POA: Diagnosis not present

## 2015-02-22 DIAGNOSIS — Z720 Tobacco use: Secondary | ICD-10-CM | POA: Insufficient documentation

## 2015-02-22 DIAGNOSIS — I5022 Chronic systolic (congestive) heart failure: Secondary | ICD-10-CM | POA: Diagnosis not present

## 2015-02-22 DIAGNOSIS — Z8659 Personal history of other mental and behavioral disorders: Secondary | ICD-10-CM | POA: Diagnosis not present

## 2015-02-22 DIAGNOSIS — R0789 Other chest pain: Secondary | ICD-10-CM | POA: Insufficient documentation

## 2015-02-22 DIAGNOSIS — I959 Hypotension, unspecified: Secondary | ICD-10-CM | POA: Diagnosis not present

## 2015-02-22 DIAGNOSIS — I252 Old myocardial infarction: Secondary | ICD-10-CM | POA: Insufficient documentation

## 2015-02-22 DIAGNOSIS — R54 Age-related physical debility: Secondary | ICD-10-CM | POA: Insufficient documentation

## 2015-02-22 DIAGNOSIS — Z7982 Long term (current) use of aspirin: Secondary | ICD-10-CM | POA: Insufficient documentation

## 2015-02-22 DIAGNOSIS — Z79899 Other long term (current) drug therapy: Secondary | ICD-10-CM | POA: Diagnosis not present

## 2015-02-22 DIAGNOSIS — Z862 Personal history of diseases of the blood and blood-forming organs and certain disorders involving the immune mechanism: Secondary | ICD-10-CM | POA: Insufficient documentation

## 2015-02-22 DIAGNOSIS — R110 Nausea: Secondary | ICD-10-CM | POA: Insufficient documentation

## 2015-02-22 DIAGNOSIS — R079 Chest pain, unspecified: Secondary | ICD-10-CM | POA: Diagnosis not present

## 2015-02-22 DIAGNOSIS — N183 Chronic kidney disease, stage 3 (moderate): Secondary | ICD-10-CM | POA: Insufficient documentation

## 2015-02-22 DIAGNOSIS — Z8639 Personal history of other endocrine, nutritional and metabolic disease: Secondary | ICD-10-CM | POA: Diagnosis not present

## 2015-02-22 DIAGNOSIS — R0600 Dyspnea, unspecified: Secondary | ICD-10-CM | POA: Diagnosis not present

## 2015-02-22 DIAGNOSIS — I129 Hypertensive chronic kidney disease with stage 1 through stage 4 chronic kidney disease, or unspecified chronic kidney disease: Secondary | ICD-10-CM | POA: Insufficient documentation

## 2015-02-22 DIAGNOSIS — R61 Generalized hyperhidrosis: Secondary | ICD-10-CM | POA: Insufficient documentation

## 2015-02-22 DIAGNOSIS — J441 Chronic obstructive pulmonary disease with (acute) exacerbation: Secondary | ICD-10-CM | POA: Insufficient documentation

## 2015-02-22 DIAGNOSIS — K219 Gastro-esophageal reflux disease without esophagitis: Secondary | ICD-10-CM | POA: Diagnosis not present

## 2015-02-22 DIAGNOSIS — R05 Cough: Secondary | ICD-10-CM | POA: Diagnosis not present

## 2015-02-22 MED ORDER — NITROGLYCERIN 2 % TD OINT
0.5000 [in_us] | TOPICAL_OINTMENT | Freq: Once | TRANSDERMAL | Status: AC
  Administered 2015-02-22: 0.5 [in_us] via TOPICAL
  Filled 2015-02-22: qty 1

## 2015-02-22 NOTE — ED Notes (Signed)
325 ASA prior to EMS arrival, 1 nitro given via EMS helped with chest tightness. Chest pain started tonight.

## 2015-02-22 NOTE — ED Provider Notes (Signed)
CSN: [id number]     Arrival date & time 02/22/15  2257 History  By signing my name below, I, Meriel Pica, attest that this documentation has been prepared under the direction and in the presence of Rolland Porter, MD starting at 23:10. Electronically Signed: Meriel Pica, ED Scribe. 02/22/2015. 11:28 PM.   Chief Complaint  Patient presents with  . Chest Pain   The history is provided by the patient and the EMS personnel. No language interpreter was used.   HPI Comments: Alexis Dillon is a 69 y.o. female, who is a daily smoker, with a PMhx of COPD, NSTEMI, GERD, paroxysmal Afib, CHF, CAD, and stage III CKD, brought in by ambulance, stating "I think I had a heart attack".  She presents to the Emergency Department complaining of sudden onset, constant but improving, non-radiating, centralized chest pain that she describes as a tightness starting around 7-8 PM while smoking a cigarette at rest. She associates diaphoresis, SOB, and nausea. Pt notes she was laying down on a heating pad with onset of her chest tightness. She reports improvement of CP with 325 Aspirin and 1 nitroglycerin tablet given en route by EMS. She is followed by a cardiologist with a PMhx of NSTEMI and cardiac stents but she is unsure of how many stents. Pt describes her symptoms tonight to be similar to symptoms assocaited with her past MI. She is a current, daily smoker at 1ppd. Pt is ambulatory with a cane at baseline but also uses a wheelchair and a walker as needed. Denies vomiting.   PCP Dr Luan Pulling  Past Medical History  Diagnosis Date  . COPD (chronic obstructive pulmonary disease) ([place])   . Tobacco abuse   . Hypertension   . History of atrial septal defect     Closure in 50's   . Mental retardation, idiopathic mild   . Arthritis   . NSTEMI (non-ST elevated myocardial infarction) ([place])   . PAF (paroxysmal atrial fibrillation) ([place]) 04/2013    a. s/p prior TEE/DCCV. b. Eliquis stopped after GIB/anemia in 03/2014.  Marland Kitchen  GERD (gastroesophageal reflux disease)   . Atrophy of right kidney   . Chronic systolic CHF (congestive heart failure) ([place])   . Diverticulosis   . Hiatal hernia   . Barrett esophagus   . Ischemic cardiomyopathy     a. 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  Marland Kitchen CAD (coronary artery disease) 04/2013    a. cath 04/2013 showed RCA, LCx occlusion, 50% mid-LAD stenosis, 80% distal stenosis, and first diagonal branch w/ 75% ostial stenosis. previously been evaluated for CABG, however d/t renal insufficiency and mild MR, the decision was previously made to manage her medically.  . Anemia   . GI bleed     a.  GIB/symptomatic anemia Hgb 6 in 03/2014 (colonoscopy - small bowel AVMs/erosions).  . Rat bite     a. Tx with rabies vaccines.  . Hyperkalemia   . CKD (chronic kidney disease), stage III   . Chronic respiratory failure [facility])    Past Surgical History  Procedure Laterality Date  . Asd repair      in her 16s  . Total hip arthroplasty Right     in her 74s  . Total abdominal hysterectomy      in the 66s  . Tee without cardioversion N/A 05/08/2013    Procedure: TRANSESOPHAGEAL ECHOCARDIOGRAM (TEE);  Surgeon: Lelon Perla, MD;  Location: [facility];  Service: Cardiovascular;  Laterality: N/A;  .  Cardioversion N/A 05/08/2013    Procedure: CARDIOVERSION;  Surgeon: Lelon Perla, MD;  Location: [facility] ENDOSCOPY;  Service: Cardiovascular;  Laterality: N/A;  Dr. Zoila Shutter verified with EP pt in a flutter...will cardiovert...  150 joules @ 4:40, using Propofol 40  mg/IV .Marland Kitchen.successful NSR   . Tonsillectomy    . Colonoscopy N/A 10/24/2013    Dr. Gala Romney: 3 polyps, one removed piecemeal (tubulovillous adenoma). Next TCS 04/2014  . Esophagogastroduodenoscopy N/A 10/24/2013    Dr. Rourk:Barrett's without dysplasia. Next EGD 10/2014  . Cardiac catheterization  04/2013    Left mainstem: LM calcified with long 30% stenosis. Left anterior descending (LAD):  The LAD is large and  wraps the apex.  The mid vessel has 50% stenosis followed by a focal 80% stenosis.  D1 small with ostial 75% stenosis.Left anterior descending (LAD):  The LAD is large and wraps the apex.  The mid vessel has 50% stenosis followed by a focal 80% stenosis.  D1 small with ostial 75% stenosis.     . Agile capsule N/A 04/05/2014    Procedure: AGILE CAPSULE;  Surgeon: Danie Binder, MD;  [facility];  Service: Endoscopy;  Laterality: N/A;  . Colonoscopy N/A 04/10/2014    Procedure: COLONOSCOPY;  Surgeon: Daneil Dolin, MD;  [facility];  Service: Endoscopy;  Laterality: N/A;  . Givens capsule study N/A 04/17/2014    Procedure: GIVENS CAPSULE STUDY;  Surgeon: Daneil Dolin, MD;  [facility];  Service: Endoscopy;  Laterality: N/A;  . Left and right heart catheterization with coronary angiogram N/A 05/04/2013    Procedure: LEFT AND RIGHT HEART CATHETERIZATION WITH CORONARY ANGIOGRAM;  Surgeon: Minus Breeding, MD;  Location: [facility] CATH LAB;  Service: Cardiovascular;  Laterality: N/A;   Family History  Problem Relation Age of Onset  . Heart attack Brother   . Heart attack Brother   . Diabetes Mother   . Heart attack Father     Died at 68 from MI  . Heart attack Sister     stent at 29  . Heart attack Sister     stent at 40  . Cancer Brother     lung  . Cancer Brother     lung   . Colon cancer Brother     deceased, diagnosed in early 26s   Social History  Substance Use Topics  . Smoking status: Current Every Day Smoker -- 1.00 packs/day for 55 years    Types: Cigarettes    Start date: 02/08/1958  . Smokeless tobacco: Never Used     Comment: stopped Dec 2014  . Alcohol Use: No   Lives at home  Lives with sister and nephew Uses a cane and sometimes walker and wheelchair  OB History    No data available     Review of Systems  Constitutional: Positive for diaphoresis.  Respiratory: Positive for shortness of breath.   Cardiovascular: Positive for chest  pain.  Gastrointestinal: Positive for nausea. Negative for vomiting.  All other systems reviewed and are negative.  Allergies  Review of patient's allergies indicates no known allergies.  Home Medications   Prior to Admission medications   Medication Sig Start Date End Date Taking? Authorizing Provider  acetaminophen (TYLENOL) 325 MG tablet Take 2 tablets (650 mg total) by mouth every 6 (six) hours as needed for mild pain or fever. 05/11/13  Yes Modena Jansky, MD  albuterol (PROVENTIL HFA;VENTOLIN HFA) 108 (90 BASE) MCG/ACT inhaler Inhale 2 puffs into the  lungs every 6 (six) hours as needed for wheezing or shortness of breath.   Yes Historical Provider, MD  albuterol (PROVENTIL) (2.5 MG/3ML) 0.083% nebulizer solution Take 3 mLs (2.5 mg total) by nebulization every 2 (two) hours as needed for wheezing or shortness of breath. 04/10/14  Yes Sinda Du, MD  ALPRAZolam Duanne Moron) 0.25 MG tablet Take 0.25 mg by mouth at bedtime as needed for anxiety.  09/18/14  Yes Historical Provider, MD  amLODipine (NORVASC) 2.5 MG tablet Take 1 tablet (2.5 mg total) by mouth daily. 07/18/14  Yes Dayna N Dunn, PA-C  aspirin EC 81 MG tablet Take 1 tablet (81 mg total) by mouth daily. 07/18/14  Yes Dayna N Dunn, PA-C  atorvastatin (LIPITOR) 80 MG tablet Take 80 mg by mouth daily.   Yes Historical Provider, MD  cholecalciferol (VITAMIN D) 1000 UNITS tablet Take 2,000 Units by mouth daily.   Yes Historical Provider, MD  isosorbide mononitrate (IMDUR) 30 MG 24 hr tablet Take 1 tablet (30 mg total) by mouth daily. 07/18/14  Yes Dayna N Dunn, PA-C  NITROSTAT 0.4 MG SL tablet Take 1 tablet by mouth daily as needed for chest pain.  07/18/14  Yes Historical Provider, MD  pantoprazole (PROTONIX) 40 MG tablet Take 40 mg by mouth daily.   Yes Historical Provider, MD  potassium chloride (K-DUR) 10 MEQ tablet Take 1 tablet (10 mEq total) by mouth 2 (two) times daily. 09/26/14  Yes Sinda Du, MD  sertraline (ZOLOFT) 50 MG tablet  Take 50 mg by mouth daily.  10/17/14  Yes Historical Provider, MD  carvedilol (COREG) 6.25 MG tablet Take 1 tablet (6.25 mg total) by mouth 2 (two) times daily with a meal. 07/18/14   Dayna N Dunn, PA-C  furosemide (LASIX) 20 MG tablet Take 2 tablets (40 mg total) by mouth daily. 09/26/14   Sinda Du, MD  HYDROcodone-acetaminophen (NORCO/VICODIN) 5-325 MG per tablet Take 1 tablet by mouth 2 (two) times daily as needed for moderate pain.  08/27/14   Historical Provider, MD  ipratropium-albuterol (DUONEB) 0.5-2.5 (3) MG/3ML SOLN Take 3 mLs by nebulization every 6 (six) hours as needed (SHORTNESS OF BREATH).    Historical Provider, MD   BP 133/76 mmHg  Pulse 78  Temp(Src) 97.7 F (36.5 C) (Oral)  Resp 17  Wt 87 lb (39.463 kg)  SpO2 98%  Vital signs normal   Physical Exam  Constitutional: She is oriented to person, place, and time.  Non-toxic appearance. She does not appear ill. No distress.  Pt is a thin, frail, elderly female.   HENT:  Head: Normocephalic and atraumatic.  Right Ear: External ear normal.  Left Ear: External ear normal.  Nose: Nose normal. No mucosal edema or rhinorrhea.  Mouth/Throat: Oropharynx is clear and moist and mucous membranes are normal. No dental abscesses or uvula swelling.  Eyes: Conjunctivae and EOM are normal. Pupils are equal, round, and reactive to light.  Neck: Normal range of motion and full passive range of motion without pain. Neck supple.  Cardiovascular: Normal rate, regular rhythm and normal heart sounds.  Exam reveals no gallop and no friction rub.   No murmur heard. Pulmonary/Chest: Effort normal and breath sounds normal. No respiratory distress. She has no wheezes. She has no rhonchi. She has no rales. She exhibits no tenderness and no crepitus.  Abdominal: Soft. Normal appearance and bowel sounds are normal. She exhibits no distension. There is no tenderness. There is no rebound and no guarding.  Musculoskeletal: Normal range of motion. She  exhibits no edema or tenderness.  Moves all extremities well.   Neurological: She is alert and oriented to person, place, and time. She has normal strength. No cranial nerve deficit.  Skin: Skin is warm, dry and intact. No rash noted. No erythema. No pallor.  Psychiatric: She has a normal mood and affect. Her speech is normal and behavior is normal. Her mood appears not anxious.  Nursing note and vitals reviewed.   ED Course  Procedures   Medications  nitroGLYCERIN (NITROGLYN) 2 % ointment 0.5 inch (0.5 inches Topical Given 02/22/15 2349)    DIAGNOSTIC STUDIES: Oxygen Saturation is 98% on RA, normal by my interpretation.    COORDINATION OF CARE: 11:22 PM Discussed treatment plan with pt at bedside and pt agreed to plan.  Patient was rechecked at 3 AM. When asked how she is doing she states her arm hurts from laying on the stretcher. She had to be asked if she still having chest pain. Her chest pain is gone. Family members in the room. We will over her test results. Patient was discharged home.  Labs Review Results for orders placed or performed during the hospital encounter of 02/22/15  Comprehensive metabolic panel  Result Value Ref Range   Sodium 141 135 - 145 mmol/L   Potassium 3.9 3.5 - 5.1 mmol/L   Chloride 109 101 - 111 mmol/L   CO2 24 22 - 32 mmol/L   Glucose, Bld 87 65 - 99 mg/dL   BUN 19 6 - 20 mg/dL   Creatinine, Ser 1.27 (H) 0.44 - 1.00 mg/dL   Calcium 9.1 8.9 - 10.3 mg/dL   Total Protein 6.2 (L) 6.5 - 8.1 g/dL   Albumin 3.6 3.5 - 5.0 g/dL   AST 19 15 - 41 U/L   ALT 12 (L) 14 - 54 U/L   Alkaline Phosphatase 87 38 - 126 U/L   Total Bilirubin 0.5 0.3 - 1.2 mg/dL   GFR calc non Af Amer 42 (L) >60 mL/min   GFR calc Af Amer 48 (L) >60 mL/min   Anion gap 8 5 - 15  Troponin I  Result Value Ref Range   Troponin I <0.03 <0.031 ng/mL  CBC with Differential  Result Value Ref Range   WBC 8.1 4.0 - 10.5 K/uL   RBC 4.20 3.87 - 5.11 MIL/uL   Hemoglobin 12.5 12.0 - 15.0  g/dL   HCT 38.9 36.0 - 46.0 %   MCV 92.6 78.0 - 100.0 fL   MCH 29.8 26.0 - 34.0 pg   MCHC 32.1 30.0 - 36.0 g/dL   RDW 14.5 11.5 - 15.5 %   Platelets 192 150 - 400 K/uL   Neutrophils Relative % 56 %   Neutro Abs 4.5 1.7 - 7.7 K/uL   Lymphocytes Relative 34 %   Lymphs Abs 2.7 0.7 - 4.0 K/uL   Monocytes Relative 8 %   Monocytes Absolute 0.6 0.1 - 1.0 K/uL   Eosinophils Relative 3 %   Eosinophils Absolute 0.2 0.0 - 0.7 K/uL   Basophils Relative 1 %   Basophils Absolute 0.0 0.0 - 0.1 K/uL  Brain natriuretic peptide  Result Value Ref Range   B Natriuretic Peptide 345.0 (H) 0.0 - 100.0 pg/mL  Troponin I  Result Value Ref Range   Troponin I <0.03 <0.031 ng/mL   Laboratory interpretation all normal except stable renal insufficiency, delta troponin is negative     Imaging Review Dg Chest Port 1 View  02/23/2015   CLINICAL DATA:  69 year old  with chronic productive cough, now with acute onset of chest pain earlier tonight. Prior sternotomy for ASD repair.  EXAM: PORTABLE CHEST 1 VIEW  COMPARISON:  09/23/2014 and earlier.  FINDINGS: Prior sternotomy. Cardiac silhouette moderately enlarged, unchanged. Prominent right central pulmonary artery, unchanged. Hyperinflation with emphysematous changes in the upper lobes, unchanged. Lungs clear. Pulmonary vascularity normal. No visible pleural effusions.  IMPRESSION: Stable cardiomegaly and COPD/emphysema. No acute cardiopulmonary disease.   Electronically Signed   By: Evangeline Dakin M.D.   On: 02/23/2015 00:02   I have personally reviewed and evaluated these images and lab results as part of my medical decision-making.   EKG Interpretation   Date/Time:  Saturday February 22 2015 23:15:47 EDT Ventricular Rate:  66 PR Interval:  134 QRS Duration: 111 QT Interval:  425 QTC Calculation: 445 R Axis:   90 Text Interpretation:  Sinus rhythm Borderline right axis deviation  Borderline ST depression, diffuse leads No significant change since last   tracing 23 Sep 2014 Confirmed by Harumi Yamin  MD-I, Landra Howze ([id number]) on 02/22/2015  11:25:46 PM      MDM   Final diagnoses:  Atypical chest pain    Plan discharge  Rolland Porter, MD, FACEP  I personally performed the services described in this documentation, which was scribed in my presence. The recorded information has been reviewed and considered.  Rolland Porter, MD, Barbette Or, MD 02/23/15 [phone]

## 2015-02-23 DIAGNOSIS — R0789 Other chest pain: Secondary | ICD-10-CM | POA: Diagnosis not present

## 2015-02-23 LAB — CBC WITH DIFFERENTIAL/PLATELET
BASOS ABS: 0 10*3/uL (ref 0.0–0.1)
BASOS PCT: 1 %
EOS ABS: 0.2 10*3/uL (ref 0.0–0.7)
EOS PCT: 3 %
HCT: 38.9 % (ref 36.0–46.0)
Hemoglobin: 12.5 g/dL (ref 12.0–15.0)
LYMPHS PCT: 34 %
Lymphs Abs: 2.7 10*3/uL (ref 0.7–4.0)
MCH: 29.8 pg (ref 26.0–34.0)
MCHC: 32.1 g/dL (ref 30.0–36.0)
MCV: 92.6 fL (ref 78.0–100.0)
Monocytes Absolute: 0.6 10*3/uL (ref 0.1–1.0)
Monocytes Relative: 8 %
Neutro Abs: 4.5 10*3/uL (ref 1.7–7.7)
Neutrophils Relative %: 56 %
PLATELETS: 192 10*3/uL (ref 150–400)
RBC: 4.2 MIL/uL (ref 3.87–5.11)
RDW: 14.5 % (ref 11.5–15.5)
WBC: 8.1 10*3/uL (ref 4.0–10.5)

## 2015-02-23 LAB — COMPREHENSIVE METABOLIC PANEL
ALT: 12 U/L — AB (ref 14–54)
ANION GAP: 8 (ref 5–15)
AST: 19 U/L (ref 15–41)
Albumin: 3.6 g/dL (ref 3.5–5.0)
Alkaline Phosphatase: 87 U/L (ref 38–126)
BUN: 19 mg/dL (ref 6–20)
CO2: 24 mmol/L (ref 22–32)
CREATININE: 1.27 mg/dL — AB (ref 0.44–1.00)
Calcium: 9.1 mg/dL (ref 8.9–10.3)
Chloride: 109 mmol/L (ref 101–111)
GFR, EST AFRICAN AMERICAN: 48 mL/min — AB (ref >60)
GFR, EST NON AFRICAN AMERICAN: 42 mL/min — AB (ref >60)
Glucose, Bld: 87 mg/dL (ref 65–99)
Potassium: 3.9 mmol/L (ref 3.5–5.1)
SODIUM: 141 mmol/L (ref 135–145)
Total Bilirubin: 0.5 mg/dL (ref 0.3–1.2)
Total Protein: 6.2 g/dL — ABNORMAL LOW (ref 6.5–8.1)

## 2015-02-23 LAB — BRAIN NATRIURETIC PEPTIDE: B NATRIURETIC PEPTIDE 5: 345 pg/mL — AB (ref 0.0–100.0)

## 2015-02-23 LAB — TROPONIN I

## 2015-02-23 NOTE — Discharge Instructions (Signed)
Your tests tonight look good, you do not have pneumonia or evidence of a heart attack.  Follow up with your heart doctor this week. Return to the ED if you feel worse.

## 2015-02-28 DIAGNOSIS — J449 Chronic obstructive pulmonary disease, unspecified: Secondary | ICD-10-CM | POA: Diagnosis not present

## 2015-02-28 DIAGNOSIS — M79676 Pain in unspecified toe(s): Secondary | ICD-10-CM | POA: Diagnosis not present

## 2015-02-28 DIAGNOSIS — B351 Tinea unguium: Secondary | ICD-10-CM | POA: Diagnosis not present

## 2015-03-11 ENCOUNTER — Other Ambulatory Visit: Payer: Self-pay | Admitting: *Deleted

## 2015-03-11 NOTE — Patient Outreach (Signed)
Call to patient home, spoke with sister [name] to confirm appointment for visit tomorrow. Appointment confirmed, plan to make visit tomorrow afternoon. Royetta Crochet. Laymond Purser, RN, BSN, [employer] [phone]

## 2015-03-12 ENCOUNTER — Encounter: Payer: Self-pay | Admitting: *Deleted

## 2015-03-12 ENCOUNTER — Other Ambulatory Visit: Payer: Self-pay | Admitting: *Deleted

## 2015-03-12 VITALS — BP 104/70 | HR 94 | Resp 20 | Wt 85.0 lb

## 2015-03-12 DIAGNOSIS — I5032 Chronic diastolic (congestive) heart failure: Secondary | ICD-10-CM

## 2015-03-12 DIAGNOSIS — J449 Chronic obstructive pulmonary disease, unspecified: Secondary | ICD-10-CM

## 2015-03-12 NOTE — Patient Outreach (Signed)
Alexis Dillon) Care Management   03/12/2015  Alexis Dillon 1946-03-07 [id number]  Alexis Dillon is an 69 y.o. female  Subjective:  "I am still smoking" patient reports smoking 1ppd Patient is weighing daily and recording. Patient denies any HF symptoms this visit.  Patient using [employer], but unable and unwilling to answer questions using the pad. Patient states family has not pursued PCS or CAP program services. Patient states "I am not ready to go to a nursing home, they will take my money", "as long as I can do for myself, I will stay here" Patient reports nephew, Alexis Dillon still manages medications.  Objective:   BP 104/70 mmHg  Pulse 94  Resp 20  Wt 85 lb (38.556 kg)  SpO2 98% Review of Systems  Constitutional: Negative.   HENT: Negative.   Eyes: Negative.   Respiratory: Negative.   Cardiovascular: Negative.   Gastrointestinal: Negative.   Genitourinary: Negative.   Musculoskeletal:       Reports a back spasm last night  Skin: Negative.     Physical Exam  Constitutional: She is oriented to person, place, and time. She appears well-developed.  Cardiovascular: Normal rate.   GI: Soft.  Musculoskeletal: Normal range of motion.  Neurological: She is alert and oriented to person, place, and time.  Skin: Skin is warm and dry.    Current Medications:   Current Outpatient Prescriptions  Medication Sig Dispense Refill  . acetaminophen (TYLENOL) 325 MG tablet Take 2 tablets (650 mg total) by mouth every 6 (six) hours as needed for mild pain or fever.    Marland Kitchen albuterol (PROVENTIL HFA;VENTOLIN HFA) 108 (90 BASE) MCG/ACT inhaler Inhale 2 puffs into the lungs every 6 (six) hours as needed for wheezing or shortness of breath.    Marland Kitchen albuterol (PROVENTIL) (2.5 MG/3ML) 0.083% nebulizer solution Take 3 mLs (2.5 mg total) by nebulization every 2 (two) hours as needed for wheezing or shortness of breath. 75 mL 12  . ALPRAZolam (XANAX) 0.25 MG tablet Take 0.25 mg  by mouth at bedtime as needed for anxiety.     Marland Kitchen amLODipine (NORVASC) 2.5 MG tablet Take 1 tablet (2.5 mg total) by mouth daily. 30 tablet 6  . aspirin EC 81 MG tablet Take 1 tablet (81 mg total) by mouth daily. 30 tablet 6  . atorvastatin (LIPITOR) 80 MG tablet Take 80 mg by mouth daily.    Marland Kitchen ipratropium-albuterol (DUONEB) 0.5-2.5 (3) MG/3ML SOLN Take 3 mLs by nebulization every 6 (six) hours as needed (SHORTNESS OF BREATH).    Marland Kitchen isosorbide mononitrate (IMDUR) 30 MG 24 hr tablet Take 1 tablet (30 mg total) by mouth daily. 30 tablet 6  . NITROSTAT 0.4 MG SL tablet Take 1 tablet by mouth daily as needed for chest pain.     . pantoprazole (PROTONIX) 40 MG tablet Take 40 mg by mouth daily.    . potassium chloride (K-DUR) 10 MEQ tablet Take 1 tablet (10 mEq total) by mouth 2 (two) times daily. 60 tablet 12  . sertraline (ZOLOFT) 50 MG tablet Take 50 mg by mouth daily.     . carvedilol (COREG) 6.25 MG tablet Take 1 tablet (6.25 mg total) by mouth 2 (two) times daily with a meal. 60 tablet 6  . cholecalciferol (VITAMIN D) 1000 UNITS tablet Take 2,000 Units by mouth daily.    . furosemide (LASIX) 20 MG tablet Take 2 tablets (40 mg total) by mouth daily. 30 tablet 12  . HYDROcodone-acetaminophen (NORCO/VICODIN) 5-325 MG per  tablet Take 1 tablet by mouth 2 (two) times daily as needed for moderate pain.      No current facility-administered medications for this visit.    Functional Status:   In your present state of health, do you have any difficulty performing the following activities: 03/12/2015 01/16/2015  Hearing? N N  Vision? N N  Difficulty concentrating or making decisions? Alexis Dillon  Walking or climbing stairs? Y Y  Dressing or bathing? N N  Doing errands, shopping? Alexis Dillon  Preparing Food and eating ? N N  Using the Toilet? N N  In the past six months, have you accidently leaked urine? Y Y  Do you have problems with loss of bowel control? N N  Managing your Medications? Y Y  Managing your Finances?  Alexis Dillon  Housekeeping or managing your Housekeeping? Alexis Dillon    Fall/Depression Screening:    PHQ 2/9 Scores 03/12/2015 01/16/2015 12/03/2014 10/07/2014  PHQ - 2 Score 0 0 0 0    Assessment:   HF COPD  Plan:  [facility] CM Care Plan Problem One        Most Recent Value   Care Plan Problem One  knowledge deficit related to HF diagnosis   Role Documenting the Problem One  Care Management Coordinator   Care Plan for Problem One  Active   [facility] Long Term Goal (31-90 days)  Patient will not have any HF exacerbations in next 60 days   [facility] Long Term Goal Start Date  03/12/15   Interventions for Problem One Long Term Goal  Reinforced HF zones and calling Primary care doctor   [facility] CM Short Term Goal #1 (0-30 days)  Patient will follow low salt diet for next 30 days   [facility] CM Short Term Goal #1 Start Date  03/12/15 [restart goal, reinstruct on low sodium food choices]   Interventions for Short Term Goal #1  Using teachback method reinstructed on Low sodium diet and foods to avoid   [facility] CM Short Term Goal #2 (0-30 days)  Patient will keep a weight log over the next 30 days   [facility] CM Short Term Goal #2 Start Date  12/03/14   [facility] CM Short Term Goal #2 Met Date  01/16/15   Interventions for Short Term Goal #2  Patient still keeping weight log    [facility] CM Care Plan Problem Two        Most Recent Value   Care Plan Problem Two  Knowledge deficit related to COPD diagnosis   Role Documenting the Problem Two  Care Management Coordinator   Care Plan for Problem Two  Not Active    [facility] CM Care Plan Problem Three        Most Recent Value   Care Plan Problem Three  Medication adherence   Role Documenting the Problem Three  Care Management Coordinator   Care Plan for Problem Three  Active   [facility] Long Term Goal (31-90) days  Patient will have and take medications as prescribed   [facility] Long Term Goal Start Date  03/12/15   Interventions for Problem Three Long Term Goal  Instructed patient to have nephew call RNCM to confirm  medications, instructed patient to remind nephew to use medication boxes so patient can be better adherent to "remembering" meds   [facility] CM Short Term Goal #1 (0-30 days)  Patient will have nephew call RNCM regarding medications in the next 2 days   [facility] CM Short Term Goal #1 Start  Date  03/12/15   Interventions for Short Term Goal #1  RNCM left card with RNCM contact, requesting call regarding medications.    Patient to have nephew call RNCM regarding medications Refer to [employer] RN to sign off     Alexis Dillon. Laymond Purser, RN, BSN, [place] [phone]

## 2015-03-13 NOTE — Patient Outreach (Signed)
[place] [facility]) Care Management  03/13/2015  Alexis Dillon 12/22/1945 [id number]   Request from Burgess Amor, RN to assign Alexis Dillon, assigned Alexis Billings, RN.  Thanks, Alexis Dillon. [place], [place] Assistant Phone: [phone] Fax: [phone]

## 2015-03-14 ENCOUNTER — Other Ambulatory Visit: Payer: Self-pay

## 2015-03-14 NOTE — Patient Outreach (Signed)
[facility] [facility]) Care Management  03/14/2015  Alexis Dillon 1946-04-29 [id number]   Telephone call to patient for introductory call for health coach role.  Patient receptive to call and states she is doing good today.  She reports she that her nurse [name] told her she was turning her over.  Explained to patient health coach and role.  She verbalized understanding.   Plan: RN Health Coach will contact patient within one month and patient agrees to next outreach.    [name], RN, MSN [place] [phone]

## 2015-03-24 ENCOUNTER — Other Ambulatory Visit: Payer: Self-pay

## 2015-03-24 NOTE — Patient Outreach (Signed)
[place] [facility]) Care Management  [place]  03/24/2015   Alexis Dillon [birth year] [id number]  Subjective: Initial Assessment with patient for health coach.  Patient reports she is doing ok except for her right hip that bothers her every now and then. Patient reports she stays with her sister.  She states she is not able to read and unable to review medications. She reports that her nephew helps her with her medications but states she takes her medication daily. Asked patient to have her nephew give health coach a call to review medications.  Contact number given to patient.  RN Health Coach will also send business card to patient.    Heart Failure: Patient reports that she is weighing daily and last weight was 88.58 lbs per patient.  Reviewed the importance of maintaining daily weights and recording them.   Discussed with patient low salt diet and importance of maintaining low salt diet.  She verbalized understanding.   Objective:   Current Medications:  Current Outpatient Prescriptions  Medication Sig Dispense Refill  . acetaminophen (TYLENOL) 325 MG tablet Take 2 tablets (650 mg total) by mouth every 6 (six) hours as needed for mild pain or fever.    Marland Kitchen albuterol (PROVENTIL HFA;VENTOLIN HFA) 108 (90 BASE) MCG/ACT inhaler Inhale 2 puffs into the lungs every 6 (six) hours as needed for wheezing or shortness of breath.    Marland Kitchen albuterol (PROVENTIL) (2.5 MG/3ML) 0.083% nebulizer solution Take 3 mLs (2.5 mg total) by nebulization every 2 (two) hours as needed for wheezing or shortness of breath. 75 mL 12  . ALPRAZolam (XANAX) 0.25 MG tablet Take 0.25 mg by mouth at bedtime as needed for anxiety.     Marland Kitchen amLODipine (NORVASC) 2.5 MG tablet Take 1 tablet (2.5 mg total) by mouth daily. 30 tablet 6  . aspirin EC 81 MG tablet Take 1 tablet (81 mg total) by mouth daily. 30 tablet 6  . atorvastatin (LIPITOR) 80 MG tablet Take 80 mg by mouth daily.    . carvedilol (COREG) 6.25 MG tablet  Take 1 tablet (6.25 mg total) by mouth 2 (two) times daily with a meal. 60 tablet 6  . cholecalciferol (VITAMIN D) 1000 UNITS tablet Take 2,000 Units by mouth daily.    . furosemide (LASIX) 20 MG tablet Take 2 tablets (40 mg total) by mouth daily. 30 tablet 12  . HYDROcodone-acetaminophen (NORCO/VICODIN) 5-325 MG per tablet Take 1 tablet by mouth 2 (two) times daily as needed for moderate pain.     Marland Kitchen ipratropium-albuterol (DUONEB) 0.5-2.5 (3) MG/3ML SOLN Take 3 mLs by nebulization every 6 (six) hours as needed (SHORTNESS OF BREATH).    Marland Kitchen isosorbide mononitrate (IMDUR) 30 MG 24 hr tablet Take 1 tablet (30 mg total) by mouth daily. 30 tablet 6  . NITROSTAT 0.4 MG SL tablet Take 1 tablet by mouth daily as needed for chest pain.     . pantoprazole (PROTONIX) 40 MG tablet Take 40 mg by mouth daily.    . potassium chloride (K-DUR) 10 MEQ tablet Take 1 tablet (10 mEq total) by mouth 2 (two) times daily. 60 tablet 12  . sertraline (ZOLOFT) 50 MG tablet Take 50 mg by mouth daily.      No current facility-administered medications for this visit.    Functional Status:  In your present state of health, do you have any difficulty performing the following activities: 03/24/2015 03/12/2015  Hearing? N N  Vision? N N  Difficulty concentrating or making decisions? Darreld Mclean  Y  Walking or climbing stairs? Y Y  Dressing or bathing? N N  Doing errands, shopping? [name]  Preparing Food and eating ? N N  Using the Toilet? N N  In the past six months, have you accidently leaked urine? N Y  Do you have problems with loss of bowel control? N N  Managing your Medications? Y Y  Managing your Finances? [name]  Housekeeping or managing your Housekeeping? [name]    Fall/Depression Screening: PHQ 2/9 Scores 03/24/2015 03/12/2015 01/16/2015 12/03/2014 10/07/2014  PHQ - 2 Score 0 0 0 0 0    Assessment: Patient will benefit from health coach outreach for continued disease management.     Plan:  [facility] CM Care Plan Problem One         Most Recent Value   Care Plan Problem One  knowledge deficit related to HF diagnosis   Role Documenting the Problem One  [place] for Problem One  Active   THN Long Term Goal (31-90 days)  Patient will not have any HF exacerbations in next 60 days   THN Long Term Goal Start Date  03/24/15   Interventions for Problem One Long Term Goal  Reviewed with patient signs and symptoms of heart failure and when to notify physician.     THN CM Short Term Goal #1 (0-30 days)  Patient will follow low salt diet for next 30 days   THN CM Short Term Goal #1 Start Date  03/24/15 [goal continued]   Interventions for Short Term Goal #1  Discussed with patient types of foods she eats and maintaining a low salt diet.    THN CM Short Term Goal #2 (0-30 days)  Patient will keep a weight log over the next 30 days   THN CM Short Term Goal #2 Start Date  12/03/14   [facility] CM Short Term Goal #2 Met Date  01/16/15   Interventions for Short Term Goal #2  Patient still keeping weight log    THN CM Care Plan Problem Two        Most Recent Value   Care Plan Problem Two  Knowledge deficit related to COPD diagnosis   Role Documenting the Problem Two  Health Coach   Care Plan for Problem Two  Not Active    [facility] CM Care Plan Problem Three        Most Recent Value   Care Plan Problem Three  Medication adherence   Role Documenting the Problem [place] for Problem Three  Active   THN Long Term Goal (31-90) days  Patient will have and take medications as prescribed   THN Long Term Goal Start Date  03/24/15 [goal continued]   Interventions for Problem Three Long Term Goal  Reemphasized patient to have nephew call RNCM to confirm medications, instructed patient to remind nephew to use medication boxes so patient can be better adherent to "remembering" meds   THN CM Short Term Goal #1 (0-30 days)  Patient will have nephew call RNCM regarding medications in the next 2 days   THN CM Short Term  Goal #1 Start Date  03/24/15 Barrie Folk continued]   Interventions for Short Term Goal #1  Patient given contact information and contact information sent to have nephew call [place] for medication review.       RN Health Coach will contact patient within one month and patient agrees to next outreach.  [name], RN, MSN [place] [phone]

## 2015-03-31 DIAGNOSIS — J449 Chronic obstructive pulmonary disease, unspecified: Secondary | ICD-10-CM | POA: Diagnosis not present

## 2015-04-14 ENCOUNTER — Encounter: Payer: Self-pay | Admitting: Licensed Clinical Social Worker

## 2015-04-14 ENCOUNTER — Other Ambulatory Visit: Payer: Self-pay | Admitting: Licensed Clinical Social Worker

## 2015-04-14 NOTE — Patient Outreach (Signed)
Assessment:  CSW called client on 04/14/15. CSW spoke via phone with client on 04/14/15. CSW verified identity of client.  Client said she has her prescribed medications and is taking medications as prescribed. Her nephew, Orlie Pollen, helps her with taking her medications as prescribed.  She said she has transport support form her brother  to go to and from her scheduled medical appointments.  She is not having any current transportation needs related to travel to and from her scheduled medical appointments.  She said she weighs about 85 pounds at present.  She said she uses a scale provided by [facility] to monitor her weight.  CSW and client completed falls assessment and depression screening with client on 04/14/15. Client said she is taking xanax as prescribed to help with depression symptoms. CSW offered to provide counseling support for client via phone as needed. Client did not agree to counseling support from CSW at present.  CSW reminded [place] that RN Burgess Amor had referred client to [place]esar Ch[place], Jon Billings and that [place] would be contacting client via phone to discuss health needs of client.  Client and CSW spoke of care plan goal of client for client to attend scheduled client medical appointments for next 30 days. Client agreed to this goal.  CSW encouraged client to call CSW at [phone] as needed to discuss social work needs of client.  CSW thanked [place] for phone conversation with CSW on 04/14/15.  Plan: Client to attend all scheduled medical appointments for client in next 30 days. CSW to call client in 4 weeks to assess client needs at that time.  Norva Riffle.Moraima Burd MSW, LCSW Licensed Clinical Social Worker [facility] Care Management [phone]

## 2015-04-21 ENCOUNTER — Other Ambulatory Visit: Payer: Self-pay

## 2015-04-21 NOTE — Patient Outreach (Addendum)
[place] [facility]) Care Management  [place]  04/21/2015   Alexis Dillon Mar 13, 1946 [id number]  Subjective: Telephone call to patient for monthly call. Patient reports she is doing good.  Patient reports she just finished a breathing treatment.  She reports that her breathing has done good especially with the use of her breathing treatments.  Discussed with patient signs and symptoms of respiratory infection. She verbalized understanding. Discussed with patient importance of the need for a medication review.  RN Health Coach gave patient contact information for nephew to call for medication review.     Heart Failure: Patient reports that she continues to weigh and record.  Reports weight has ranged around 88 lbs.  Discussed with patient the importance on maintaining a low salt diet.  She verbalized understanding.    Objective:   Current Medications:  Current Outpatient Prescriptions  Medication Sig Dispense Refill  . acetaminophen (TYLENOL) 325 MG tablet Take 2 tablets (650 mg total) by mouth every 6 (six) hours as needed for mild pain or fever.    Marland Kitchen albuterol (PROVENTIL HFA;VENTOLIN HFA) 108 (90 BASE) MCG/ACT inhaler Inhale 2 puffs into the lungs every 6 (six) hours as needed for wheezing or shortness of breath.    Marland Kitchen albuterol (PROVENTIL) (2.5 MG/3ML) 0.083% nebulizer solution Take 3 mLs (2.5 mg total) by nebulization every 2 (two) hours as needed for wheezing or shortness of breath. 75 mL 12  . ALPRAZolam (XANAX) 0.25 MG tablet Take 0.25 mg by mouth at bedtime as needed for anxiety.     Marland Kitchen amLODipine (NORVASC) 2.5 MG tablet Take 1 tablet (2.5 mg total) by mouth daily. 30 tablet 6  . aspirin EC 81 MG tablet Take 1 tablet (81 mg total) by mouth daily. 30 tablet 6  . atorvastatin (LIPITOR) 80 MG tablet Take 80 mg by mouth daily.    . carvedilol (COREG) 6.25 MG tablet Take 1 tablet (6.25 mg total) by mouth 2 (two) times daily with a meal. 60 tablet 6  . cholecalciferol  (VITAMIN D) 1000 UNITS tablet Take 2,000 Units by mouth daily.    . furosemide (LASIX) 20 MG tablet Take 2 tablets (40 mg total) by mouth daily. 30 tablet 12  . HYDROcodone-acetaminophen (NORCO/VICODIN) 5-325 MG per tablet Take 1 tablet by mouth 2 (two) times daily as needed for moderate pain.     Marland Kitchen ipratropium-albuterol (DUONEB) 0.5-2.5 (3) MG/3ML SOLN Take 3 mLs by nebulization every 6 (six) hours as needed (SHORTNESS OF BREATH).    Marland Kitchen isosorbide mononitrate (IMDUR) 30 MG 24 hr tablet Take 1 tablet (30 mg total) by mouth daily. 30 tablet 6  . NITROSTAT 0.4 MG SL tablet Take 1 tablet by mouth daily as needed for chest pain.     . pantoprazole (PROTONIX) 40 MG tablet Take 40 mg by mouth daily.    . potassium chloride (K-DUR) 10 MEQ tablet Take 1 tablet (10 mEq total) by mouth 2 (two) times daily. 60 tablet 12  . sertraline (ZOLOFT) 50 MG tablet Take 50 mg by mouth daily.      No current facility-administered medications for this visit.    Functional Status:  In your present state of health, do you have any difficulty performing the following activities: 04/14/2015 03/24/2015  Hearing? N N  Vision? N N  Difficulty concentrating or making decisions? N Y  Walking or climbing stairs? Y Y  Dressing or bathing? N N  Doing errands, shopping? [name]  Preparing Food and eating ? Darreld Mclean  N  Using the Toilet? N N  In the past six months, have you accidently leaked urine? N N  Do you have problems with loss of bowel control? N N  Managing your Medications? Y Y  Managing your Finances? [name]  Housekeeping or managing your Housekeeping? [name]    Fall/Depression Screening: PHQ 2/9 Scores 04/21/2015 04/14/2015 03/24/2015 03/12/2015 01/16/2015 12/03/2014 10/07/2014  PHQ - 2 Score 0 0 0 0 0 0 0    Assessment: Patient continues to benefit from health coach outreach for disease management.    Plan:   [facility] CM Care Plan Problem One        Most Recent Value   Care Plan Problem One  knowledge deficit related to HF  diagnosis   Role Documenting the Problem One  Health Coach   Care Plan for Problem One  Active   THN Long Term Goal (31-90 days)  Patient will not have any HF exacerbations in next 60 days   THN Long Term Goal Start Date  03/24/15   Interventions for Problem One [place] reinforced signs and symptoms of heart failure.   THN CM Short Term Goal #1 (0-30 days)  Patient will follow low salt diet for next 30 days   THN CM Short Term Goal #1 Start Date  04/21/15 [goal continued]   Interventions for Short Term Goal #1  RN Health Coach reviewed with patient items of a low sodium diet.  Reviewed with patient foods high in salt.     THN CM Short Term Goal #2 (0-30 days)  Patient will keep a weight log over the next 30 days   THN CM Short Term Goal #2 Start Date  12/03/14   [facility] CM Short Term Goal #2 Met Date  01/16/15   Interventions for Short Term Goal #2  Patient still keeping weight log    THN CM Care Plan Problem Two        Most Recent Value   Care Plan Problem Two  Knowledge deficit related to COPD diagnosis   Role Documenting the Problem Two  Health Coach   Care Plan for Problem Two  Not Active    [facility] CM Care Plan Problem Three        Most Recent Value   Care Plan Problem Three  Medication adherence   Role Documenting the Problem [place] for Problem Three  Active   THN Long Term Goal (31-90) days  Patient will have and take medications as prescribed   THN Long Term Goal Start Date  04/21/15 [goal continued]   Interventions for Problem Three Long Term Goal  Reinforced importance of medication review with patient and RN Health Coach contact information given to have nephew call.     THN CM Short Term Goal #1 (0-30 days)  Patient will have nephew call Health Coach regarding medications in the next 7 days.    THN CM Short Term Goal #1 Start Date  04/21/15 [goal continued]   Interventions for Short Term Goal #1  Patient given contact information and  contact information sent to have nephew call [place] for medication review.         RN Health Coach will contact patient within one month and patient agrees to next outreach.    [name], RN, MSN [place] [phone]

## 2015-04-30 DIAGNOSIS — J449 Chronic obstructive pulmonary disease, unspecified: Secondary | ICD-10-CM | POA: Diagnosis not present

## 2015-05-13 ENCOUNTER — Other Ambulatory Visit: Payer: Self-pay | Admitting: Licensed Clinical Social Worker

## 2015-05-13 DIAGNOSIS — M79676 Pain in unspecified toe(s): Secondary | ICD-10-CM | POA: Diagnosis not present

## 2015-05-13 DIAGNOSIS — B351 Tinea unguium: Secondary | ICD-10-CM | POA: Diagnosis not present

## 2015-05-13 NOTE — Patient Outreach (Signed)
Assessment:  CSW called client on 05/13/15 and spoke via phone with client on 05/13/15. CSW verified identity of client. CSW and client spoke of client needs. Client said she will have another appointment with Dr. Luan Pulling, her primary doctor, soon for medical evaluation for client. She said she sees Dr. Luan Pulling every three months  Client said she has her prescribed medications and is taking medications as prescribed.  She said she has an adequate food supply.  She said her brother helps transport her  to and from her scheduled medical appointments. She said she went to a podiatrist today for foot care.  CSW and client spoke of client care plan.  CSW spoke with client about client's managing anxiety and stress. She said she likes to relax by listening to music, by watching her favorite TV shows, by watching detective shows/mysteries, by having others read to her, or by speaking via phone with relatives and friends. She said she tries to use the relaxation techniques to help her manage anxiety and stress. CSW encouraged client to continue using these relaxation techniques to help client manage anxiety and stress.   CSW spoke with client about [facility] program and that [place] is also providing support for client as a Engineer, maintenance for client. CSW encouraged client to call CSW as needed at [phone] to discuss social work needs of client.  CSW thanked client for phone conversation with CSW on 05/13/15.   Plan: Client to use relaxation techniques discussed to help client manage anxiety and stress. CSW to call client in 4 weeks to assess needs of client.  Norva Riffle.Trang Bouse MSW, LCSW Licensed Clinical Social Worker [facility] Care Management [phone]

## 2015-05-20 ENCOUNTER — Ambulatory Visit: Payer: Self-pay

## 2015-05-20 ENCOUNTER — Other Ambulatory Visit: Payer: Self-pay

## 2015-05-20 NOTE — Patient Outreach (Signed)
[place] [facility]) Care Management  05/20/2015  Kaleisha Jurewicz 02/10/1946 [id number]   Telephone call to patient.  Female answered stating that patient is not available.  Advised that health coach would contact patient another time.    Plan: RN Health Coach will contact patient within 1-2 weeks.    [name], RN, MSN [place] [phone]

## 2015-05-27 ENCOUNTER — Other Ambulatory Visit: Payer: Self-pay

## 2015-05-27 NOTE — Patient Outreach (Signed)
[facility]) Care Management  [facility] Care Manager  05/27/2015   Alexis Dillon 11-30-1945 [id number]  Subjective: Telephone call to patient for monthly call.  Patient reports she is doing ok but has some sniffles the last couple of days.  Discussed with patient being aware of changes in cold symptoms and when to notify physician. She verbalized understanding.  Patient denies pain today.  Patient unable to do medication review again as patient states that her niece Toniann Fail has her bottles.  Patient reports that she takes her medication from her bottles as she is able to manage it that way.  Discussed with patient importance of completing medication review with health coach. She verbalized understanding.    Heart Failure: Patient reports that she has not weighed today but her weight has run around 88 lbs.  Discussed with patient importance of weights and recording.  Also discussed low sodium diet with patient.  She verbalized understanding.    Objective:    Functional Status:  In your present state of health, do you have any difficulty performing the following activities: 04/14/2015 03/24/2015  Hearing? N N  Vision? N N  Difficulty concentrating or making decisions? N Y  Walking or climbing stairs? Y Y  Dressing or bathing? N N  Doing errands, shopping? [name]  Preparing Food and eating ? Y N  Using the Toilet? N N  In the past six months, have you accidently leaked urine? N N  Do you have problems with loss of bowel control? N N  Managing your Medications? Y Y  Managing your Finances? [name]  Housekeeping or managing your Housekeeping? [name]    Fall/Depression Screening: PHQ 2/9 Scores 05/27/2015 04/21/2015 04/14/2015 03/24/2015 03/12/2015 01/16/2015 12/03/2014  PHQ - 2 Score 0 0 0 0 0 0 0    Assessment: Patient continues to benefit from health coach outreach for disease management.  Plan:  [facility] CM Care Plan Problem One        Most Recent Value   Care Plan Problem One  knowledge  deficit related to HF diagnosis   Role Documenting the Problem One  Health Coach   Care Plan for Problem One  Active   THN Long Term Goal (31-90 days)  Patient will not have any HF exacerbations in next 90 days   THN Long Term Goal Start Date  05/27/15 [goal continued]   Interventions for Problem One Long Term Goal  RN Health Coach reiterated signs and symptoms of heart failure.   THN CM Short Term Goal #1 (0-30 days)  Patient will follow low salt diet for next 30 days   THN CM Short Term Goal #1 Start Date  05/27/15 [goal continued]   Interventions for Short Term Goal #1  RN Health Coach discussed with patient low sodium diet.    THN CM Short Term Goal #2 (0-30 days)  Patient will keep a weight log over the next 30 days   THN CM Short Term Goal #2 Start Date  12/03/14   [facility] CM Short Term Goal #2 Met Date  01/16/15   Interventions for Short Term Goal #2  Patient still keeping weight log    THN CM Care Plan Problem Two        Most Recent Value   Care Plan Problem Two  Knowledge deficit related to COPD diagnosis   Role Documenting the Problem Two  Health Coach   Care Plan for Problem Two  Not Active    THN CM  Care Plan Problem Three        Most Recent Value   Care Plan Problem Three  Medication adherence   Role Documenting the Problem [place] for Problem Three  Active   THN Long Term Goal (31-90) days  Patient will have and take medications as prescribed   THN Long Term Goal Start Date  04/21/15 [goal continued]   THN Long Term Goal Met Date  05/27/15 [Patient reports taking medications as prescribed]   THN CM Short Term Goal #1 (0-30 days)  Patient will have someone review medications with health coach within the next 3 days   THN CM Short Term Goal #1 Start Date  05/27/15 [goal continued]   Interventions for Short Term Goal #1  Discussed with patient the importance of medication review.       RN Health Coach will contact patient within one month and patient  agrees to next outreach.    [name], RN, MSN [place] [phone]

## 2015-05-31 DIAGNOSIS — J449 Chronic obstructive pulmonary disease, unspecified: Secondary | ICD-10-CM | POA: Diagnosis not present

## 2015-06-08 DIAGNOSIS — R079 Chest pain, unspecified: Secondary | ICD-10-CM | POA: Diagnosis not present

## 2015-06-12 ENCOUNTER — Other Ambulatory Visit: Payer: Self-pay | Admitting: Licensed Clinical Social Worker

## 2015-06-12 NOTE — Patient Outreach (Signed)
Assessment:  CSW spoke via phone with client on 06/12/15. CSW verified client identity. CSW and client spoke of client needs. Alexis Dillon reported to [place] that she was eating adequately and sleeping adequately. She said she had her prescribed medications and is taking medications as prescribed. She said she has support from her brother and from her sister. She said her brother helps transport her to and from her scheduled medical appointments. She and CSW spoke of client care plan. She said she likes to use relaxation techniques to help manage anxiety and stress. She said she likes to take a walk, listen to her favorite type of music, watch TV detective shows, or talk on the phone with family and friends as ways to relax. CSW encouraged Alexis Dillon to continue to use these relaxation techniques to help her manage anxiety and stress. She said she plans to see her primary doctor in the next week for an appointment. She said she is walking via use of a walker.  CSW reminded Alexis Dillon that she could call CSW at [phone] as needed to address social work needs of client. CSW has talked with Alexis Dillon about [facility] resources such as mental health support, transport support, nursing support, or pharmacy support. CSW thanked [place] for phone call with CSW on 06/12/15.  Plan: Client to use relaxation techniques in next 30 days to help client manage anxiety and stress symptoms faced by client. CSW to call client in three weeks to assess client needs.  Norva Riffle.Deysy Schabel MSW, LCSW Licensed Clinical Social Worker [facility] Care Management [phone]

## 2015-06-24 ENCOUNTER — Other Ambulatory Visit: Payer: Self-pay

## 2015-06-24 ENCOUNTER — Ambulatory Visit: Payer: Medicare Other

## 2015-06-24 ENCOUNTER — Ambulatory Visit: Payer: Self-pay

## 2015-06-24 NOTE — Patient Outreach (Signed)
[facility]) Care Management  06/24/2015  Alexis Dillon 12/05/1945 [id number]   Telephone call to patient for monthly outreach.  No answer.  Unable to leave a message.    Plan: RN Health Coach will contact patient within 1-2 weeks.    [name], RN, MSN [place] [phone]

## 2015-06-25 ENCOUNTER — Encounter: Payer: Self-pay | Admitting: Licensed Clinical Social Worker

## 2015-06-25 ENCOUNTER — Other Ambulatory Visit: Payer: Self-pay | Admitting: Licensed Clinical Social Worker

## 2015-06-25 NOTE — Patient Outreach (Signed)
Assessment:  CSW called client home phone number on 06/25/15 and spoke via phone with client on 06/25/15. CSW verified client identity. CSW and [place] spoke of client needs. Client said she is eating adequately and sleeping adequately. She said that her brother helps in transporting her to and from her scheduled medical appointments. She said she had her prescribed medications and was taking medications as prescribed. She and CSW spoke of client care plan. She said she is continuing to use relaxation techniques to help her manage anxiety and stress symptoms. She said she likes to occasionally take a walk, listen to her favorite music, watch TV detective shows,or likes to speak via phone with family or friends as ways to relax.  CSW encouraged client to continue to use relaxation techniques as a way for her to manage anxiety and stress symptoms experienced.  CSW informed client that she had met her care plan goal through clinical social work and that [place] would discharge client from [place] services only on 06/25/15 since client had met her care plan goal.  Client agreed to this plan. Client will continue to receive [facility] support with [place] RN.  CSW thanked [place] for phone call with CSW on 06/25/15.  CSW congratulated [place] on reaching her care plan goal through clinical social work services.  Plan:  CSW is discharging Kemiya Batdorf on 06/25/15 from [place] only since client has met her care plan goal through clinical social work services. CSW to inform Josepha Pigg that [place] discharged client on 06/25/15 from [place] services only. CSW to fax letter to Dr. Luan Pulling on 06/25/15 informing Dr. Luan Pulling that [place] discharged client on 06/25/15 from [place] services only and that [place], [place], will continue to provide telephonic nursing support for client.  Norva Riffle.Nolah Krenzer MSW, LCSW Licensed Clinical Social Worker [facility] Care Management [phone]

## 2015-07-01 DIAGNOSIS — J449 Chronic obstructive pulmonary disease, unspecified: Secondary | ICD-10-CM | POA: Diagnosis not present

## 2015-07-02 ENCOUNTER — Other Ambulatory Visit: Payer: Self-pay

## 2015-07-02 NOTE — Patient Outreach (Signed)
Alexis Dillon) Care Management  Alexis Dillon  07/02/2015   Alexis Dillon [birth year] [id number]  Subjective: Telephone call to patient for monthly call.  Patient reports she is doing ok.  Patient reports that she continues to have weights in the eighties.  Patient reports that her nephew has moved out and that her sister [name] is doing her medications. Sister is unavailable to review all of the medications but patient able to tell me about some of her medications.  Will do complete medication review with sister when she calls. Patient reports some right knee pain but nothing out of the usual.  Discussed with patient importance of weights, watching salt intake, and limiting snacking items such as chips and crackers due to salt intake.  She verbalized understanding.  Objective:   Current Medications:  Current Outpatient Prescriptions  Medication Sig Dispense Refill  . acetaminophen (TYLENOL) 325 MG tablet Take 2 tablets (650 mg total) by mouth every 6 (six) hours as needed for mild pain or fever.    Marland Kitchen albuterol (PROVENTIL HFA;VENTOLIN HFA) 108 (90 BASE) MCG/ACT inhaler Inhale 2 puffs into the lungs every 6 (six) hours as needed for wheezing or shortness of breath.    Marland Kitchen albuterol (PROVENTIL) (2.5 MG/3ML) 0.083% nebulizer solution Take 3 mLs (2.5 mg total) by nebulization every 2 (two) hours as needed for wheezing or shortness of breath. 75 mL 12  . ALPRAZolam (XANAX) 0.25 MG tablet Take 0.25 mg by mouth at bedtime as needed for anxiety.     Marland Kitchen amLODipine (NORVASC) 2.5 MG tablet Take 1 tablet (2.5 mg total) by mouth daily. 30 tablet 6  . aspirin EC 81 MG tablet Take 1 tablet (81 mg total) by mouth daily. 30 tablet 6  . atorvastatin (LIPITOR) 80 MG tablet Take 80 mg by mouth daily.    . furosemide (LASIX) 20 MG tablet Take 2 tablets (40 mg total) by mouth daily. 30 tablet 12  . carvedilol (COREG) 6.25 MG tablet Take 1 tablet (6.25 mg total) by mouth 2 (two) times daily with a meal.  60 tablet 6  . cholecalciferol (VITAMIN D) 1000 UNITS tablet Take 2,000 Units by mouth daily.    Marland Kitchen HYDROcodone-acetaminophen (NORCO/VICODIN) 5-325 MG per tablet Take 1 tablet by mouth 2 (two) times daily as needed for moderate pain.     Marland Kitchen ipratropium-albuterol (DUONEB) 0.5-2.5 (3) MG/3ML SOLN Take 3 mLs by nebulization every 6 (six) hours as needed (SHORTNESS OF BREATH).    Marland Kitchen isosorbide mononitrate (IMDUR) 30 MG 24 hr tablet Take 1 tablet (30 mg total) by mouth daily. 30 tablet 6  . NITROSTAT 0.4 MG SL tablet Take 1 tablet by mouth daily as needed for chest pain.     . pantoprazole (PROTONIX) 40 MG tablet Take 40 mg by mouth daily.    . potassium chloride (K-DUR) 10 MEQ tablet Take 1 tablet (10 mEq total) by mouth 2 (two) times daily. 60 tablet 12  . sertraline (ZOLOFT) 50 MG tablet Take 50 mg by mouth daily.      No current facility-administered medications for this visit.    Functional Status:  In your present state of health, do you have any difficulty performing the following activities: 06/25/2015 04/14/2015  Hearing? N N  Vision? N N  Difficulty concentrating or making decisions? Y N  Walking or climbing stairs? Y Y  Dressing or bathing? N N  Doing errands, shopping? Alexis Dillon  Preparing Food and eating ? Y Y  Using the Toilet?  N N  In the past six months, have you accidently leaked urine? N N  Do you have problems with loss of bowel control? N N  Managing your Medications? Y Y  Managing your Finances? Alexis Dillon  Housekeeping or managing your Housekeeping? Alexis Dillon    Fall/Depression Screening: PHQ 2/9 Scores 07/02/2015 06/25/2015 06/12/2015 05/27/2015 04/21/2015 04/14/2015 03/24/2015  PHQ - 2 Score 0 2 2 0 0 0 0  PHQ- 9 Score - 6 6 - - - -    Assessment: Patient continues to benefit from health coach outreach for disease management and support.    Plan:  [facility] CM Care Plan Problem One        Most Recent Value   Care Plan Problem One  knowledge deficit related to HF diagnosis   Role Documenting  the Problem One  [place] for Problem One  Active   THN Long Term Goal (31-90 days)  Patient will not have any HF exacerbations in next 90 days   THN Long Term Goal Start Date  07/02/15 [goal continued]   Interventions for Problem One Long Term Goal  RN Health Coach reviewed signs and symptoms of heart failure.   THN CM Short Term Goal #1 (0-30 days)  Patient will follow low salt diet for next 30 days   THN CM Short Term Goal #1 Start Date  07/02/15 [goal continued]   Interventions for Short Term Goal #1  RN Health Coach reviewed with patient low sodium diet.     THN CM Care Plan Problem Two        Most Recent Value   Care Plan Problem Two  Knowledge deficit related to COPD diagnosis   Role Documenting the Problem Two  Health Coach   Care Plan for Problem Two  Not Active    [facility] CM Care Plan Problem Three        Most Recent Value   Care Plan Problem Three  Medication adherence   Role Documenting the Problem Alexis Dillon for Problem Three  Active   THN CM Short Term Goal #1 (0-30 days)  Patient will have someone review medications with health coach within the next 3 days   THN CM Short Term Goal #1 Start Date  07/02/15 [goal continued]   Interventions for Short Term Goal #1  Reviewed with patient the importance of medication review.  Patient to have sister to review medication as nephew has moved out.       RN Health Coach will contact patient within one month and patient agrees to next outreach.    [name], RN, MSN [place] [phone]

## 2015-07-03 ENCOUNTER — Ambulatory Visit: Payer: Medicare Other | Admitting: Licensed Clinical Social Worker

## 2015-07-22 DIAGNOSIS — M79676 Pain in unspecified toe(s): Secondary | ICD-10-CM | POA: Diagnosis not present

## 2015-07-22 DIAGNOSIS — B351 Tinea unguium: Secondary | ICD-10-CM | POA: Diagnosis not present

## 2015-07-23 ENCOUNTER — Other Ambulatory Visit: Payer: Self-pay

## 2015-07-23 NOTE — Patient Outreach (Signed)
[place] [facility]) Care Management  [place]  07/23/2015   Alexis Dillon 08-Oct-1945 [id number]  Subjective: Telephone call to patient for monthly call.  Patient reports she is doing ok.  Patient reports she is dealing with allergies.  Discussed with patient signs of upper respiratory infection and when to notify doctor.  She verbalized understanding.  Patient reports her weight continues in the 80's.  Discussed with patient heart failure and signs and symptoms to watch for.  She verbalized understanding. Patient unsure of medications and her sister is not home to review.  Asked patient to have sister call to review medications.  She verbalized understanding.    Objective:   Current Medications:  Current Outpatient Prescriptions  Medication Sig Dispense Refill  . acetaminophen (TYLENOL) 325 MG tablet Take 2 tablets (650 mg total) by mouth every 6 (six) hours as needed for mild pain or fever.    Marland Kitchen albuterol (PROVENTIL HFA;VENTOLIN HFA) 108 (90 BASE) MCG/ACT inhaler Inhale 2 puffs into the lungs every 6 (six) hours as needed for wheezing or shortness of breath.    Marland Kitchen albuterol (PROVENTIL) (2.5 MG/3ML) 0.083% nebulizer solution Take 3 mLs (2.5 mg total) by nebulization every 2 (two) hours as needed for wheezing or shortness of breath. 75 mL 12  . ALPRAZolam (XANAX) 0.25 MG tablet Take 0.25 mg by mouth at bedtime as needed for anxiety.     Marland Kitchen amLODipine (NORVASC) 2.5 MG tablet Take 1 tablet (2.5 mg total) by mouth daily. 30 tablet 6  . aspirin EC 81 MG tablet Take 1 tablet (81 mg total) by mouth daily. 30 tablet 6  . atorvastatin (LIPITOR) 80 MG tablet Take 80 mg by mouth daily.    . carvedilol (COREG) 6.25 MG tablet Take 1 tablet (6.25 mg total) by mouth 2 (two) times daily with a meal. 60 tablet 6  . cholecalciferol (VITAMIN D) 1000 UNITS tablet Take 2,000 Units by mouth daily.    . furosemide (LASIX) 20 MG tablet Take 2 tablets (40 mg total) by mouth daily. 30 tablet 12  .  HYDROcodone-acetaminophen (NORCO/VICODIN) 5-325 MG per tablet Take 1 tablet by mouth 2 (two) times daily as needed for moderate pain.     Marland Kitchen ipratropium-albuterol (DUONEB) 0.5-2.5 (3) MG/3ML SOLN Take 3 mLs by nebulization every 6 (six) hours as needed (SHORTNESS OF BREATH).    Marland Kitchen isosorbide mononitrate (IMDUR) 30 MG 24 hr tablet Take 1 tablet (30 mg total) by mouth daily. 30 tablet 6  . NITROSTAT 0.4 MG SL tablet Take 1 tablet by mouth daily as needed for chest pain.     . pantoprazole (PROTONIX) 40 MG tablet Take 40 mg by mouth daily.    . potassium chloride (K-DUR) 10 MEQ tablet Take 1 tablet (10 mEq total) by mouth 2 (two) times daily. 60 tablet 12  . sertraline (ZOLOFT) 50 MG tablet Take 50 mg by mouth daily.      No current facility-administered medications for this visit.    Functional Status:  In your present state of health, do you have any difficulty performing the following activities: 06/25/2015 04/14/2015  Hearing? N N  Vision? N N  Difficulty concentrating or making decisions? Y N  Walking or climbing stairs? Y Y  Dressing or bathing? N N  Doing errands, shopping? [name]  Preparing Food and eating ? Y Y  Using the Toilet? N N  In the past six months, have you accidently leaked urine? N N  Do you have problems  with loss of bowel control? N N  Managing your Medications? Y Y  Managing your Finances? [name]  Housekeeping or managing your Housekeeping? [name]    Fall/Depression Screening: PHQ 2/9 Scores 07/23/2015 07/02/2015 06/25/2015 06/12/2015 05/27/2015 04/21/2015 04/14/2015  PHQ - 2 Score 0 0 2 2 0 0 0  PHQ- 9 Score - - 6 6 - - -    Assessment: Patient continues to benefit from health coach outreach for disease management and support.     Plan:  [facility] CM Care Plan Problem One        Most Recent Value   Care Plan Problem One  knowledge deficit related to HF diagnosis   Role Documenting the Problem One  [facility] [place] for Problem One  Active   THN Long Term Goal (31-90 days)   Patient will not have any HF exacerbations in next 90 days   THN Long Term Goal Start Date  07/02/15 [goal continued]   Interventions for Problem One Long Term Goal  RN Health Coach discussed signs and symptoms of heart failure.   THN CM Short Term Goal #1 (0-30 days)  Patient will follow low salt diet for next 30 days   THN CM Short Term Goal #1 Start Date  07/23/15 [goal continued]   Interventions for Short Term Goal #1  RN Health Coach discussed with patient low sodium diet.     THN CM Care Plan Problem Two        Most Recent Value   Care Plan Problem Two  Knowledge deficit related to COPD diagnosis   Role Documenting the Problem Two  Health Coach   Care Plan for Problem Two  Not Active    [facility] CM Care Plan Problem Three        Most Recent Value   Care Plan Problem Three  Medication adherence   Role Documenting the Problem [place] for Problem Three  Active   THN CM Short Term Goal #1 (0-30 days)  Patient will have someone review medications with health coach within the next 30 days   THN CM Short Term Goal #1 Start Date  07/23/15 [goal continued]   Interventions for Short Term Goal #1  Reemphasized with patient the importance of medication review.  Patient to have sister to review medication as nephew has moved out.       RN Health Coach will contact patient within one month and patient agrees to next outreach.    [name], RN, MSN [place] [phone]

## 2015-07-29 DIAGNOSIS — J449 Chronic obstructive pulmonary disease, unspecified: Secondary | ICD-10-CM | POA: Diagnosis not present

## 2015-08-07 DIAGNOSIS — I2510 Atherosclerotic heart disease of native coronary artery without angina pectoris: Secondary | ICD-10-CM | POA: Diagnosis not present

## 2015-08-07 DIAGNOSIS — I10 Essential (primary) hypertension: Secondary | ICD-10-CM | POA: Diagnosis not present

## 2015-08-07 DIAGNOSIS — I129 Hypertensive chronic kidney disease with stage 1 through stage 4 chronic kidney disease, or unspecified chronic kidney disease: Secondary | ICD-10-CM | POA: Diagnosis not present

## 2015-08-07 DIAGNOSIS — J449 Chronic obstructive pulmonary disease, unspecified: Secondary | ICD-10-CM | POA: Diagnosis not present

## 2015-08-12 DIAGNOSIS — I129 Hypertensive chronic kidney disease with stage 1 through stage 4 chronic kidney disease, or unspecified chronic kidney disease: Secondary | ICD-10-CM | POA: Diagnosis not present

## 2015-08-12 DIAGNOSIS — J449 Chronic obstructive pulmonary disease, unspecified: Secondary | ICD-10-CM | POA: Diagnosis not present

## 2015-08-12 DIAGNOSIS — I2510 Atherosclerotic heart disease of native coronary artery without angina pectoris: Secondary | ICD-10-CM | POA: Diagnosis not present

## 2015-08-12 DIAGNOSIS — I10 Essential (primary) hypertension: Secondary | ICD-10-CM | POA: Diagnosis not present

## 2015-08-19 ENCOUNTER — Other Ambulatory Visit: Payer: Self-pay

## 2015-08-19 NOTE — Patient Outreach (Signed)
[place] [facility]) Care Management  [place]  08/19/2015   Alexis Dillon 05-05-1946 [id number]  Subjective: Telephone call to patient for monthly outreach. Patient reports she is doing ok. Patient reports that her weights remain in the 80's.  Discussed with patient heart failure zone chart.  Patient's sister present medication review done.  No concerns.    Objective:   Encounter Medications:  Outpatient Encounter Prescriptions as of 08/19/2015  Medication Sig Note  . acetaminophen (TYLENOL) 325 MG tablet Take 2 tablets (650 mg total) by mouth every 6 (six) hours as needed for mild pain or fever.   Marland Kitchen albuterol (PROVENTIL HFA;VENTOLIN HFA) 108 (90 BASE) MCG/ACT inhaler Inhale 2 puffs into the lungs every 6 (six) hours as needed for wheezing or shortness of breath. 03/12/2015: Using as needed  . albuterol (PROVENTIL) (2.5 MG/3ML) 0.083% nebulizer solution Take 3 mLs (2.5 mg total) by nebulization every 2 (two) hours as needed for wheezing or shortness of breath.   . ALPRAZolam (XANAX) 0.25 MG tablet Take 0.25 mg by mouth at bedtime as needed for anxiety.  10/07/2014: Sister gives only as needed, does not use regularly  . amLODipine (NORVASC) 2.5 MG tablet Take 1 tablet (2.5 mg total) by mouth daily.   Marland Kitchen aspirin EC 81 MG tablet Take 1 tablet (81 mg total) by mouth daily.   Marland Kitchen atorvastatin (LIPITOR) 80 MG tablet Take 80 mg by mouth daily. 02/22/2015: Verify the date. Medication provided was dated 09/2014  . carvedilol (COREG) 6.25 MG tablet Take 1 tablet (6.25 mg total) by mouth 2 (two) times daily with a meal. 03/12/2015: Unable to verify, no bottle and nephew not available  . cholecalciferol (VITAMIN D) 1000 UNITS tablet Take 2,000 Units by mouth daily. 03/12/2015: Unable to verify, nephew not available  . furosemide (LASIX) 20 MG tablet Take 2 tablets (40 mg total) by mouth daily. 03/12/2015: Patient reports she is taking, but no bottle and nephew not available to verify   .  HYDROcodone-acetaminophen (NORCO/VICODIN) 5-325 MG per tablet Take 1 tablet by mouth 2 (two) times daily as needed for moderate pain.  03/12/2015: Unable to verify, nephew not available to verify, no bottle   . ipratropium-albuterol (DUONEB) 0.5-2.5 (3) MG/3ML SOLN Take 3 mLs by nebulization every 6 (six) hours as needed (SHORTNESS OF BREATH). 10/07/2014: Sometimes uses more than 4x a day  . isosorbide mononitrate (IMDUR) 30 MG 24 hr tablet Take 1 tablet (30 mg total) by mouth daily.   Marland Kitchen NITROSTAT 0.4 MG SL tablet Take 1 tablet by mouth daily as needed for chest pain.    . pantoprazole (PROTONIX) 40 MG tablet Take 40 mg by mouth daily.   . potassium chloride (K-DUR) 10 MEQ tablet Take 1 tablet (10 mEq total) by mouth 2 (two) times daily.   . sertraline (ZOLOFT) 50 MG tablet Take 50 mg by mouth daily.     No facility-administered encounter medications on file as of 08/19/2015.    Functional Status:  In your present state of health, do you have any difficulty performing the following activities: 06/25/2015 04/14/2015  Hearing? N N  Vision? N N  Difficulty concentrating or making decisions? Y N  Walking or climbing stairs? Y Y  Dressing or bathing? N N  Doing errands, shopping? [name]  Preparing Food and eating ? Y Y  Using the Toilet? N N  In the past six months, have you accidently leaked urine? N N  Do you have problems with loss of bowel  control? N N  Managing your Medications? Y Y  Managing your Finances? [name]  Housekeeping or managing your Housekeeping? [name]    Fall/Depression Screening: PHQ 2/9 Scores 08/19/2015 07/23/2015 07/02/2015 06/25/2015 06/12/2015 05/27/2015 04/21/2015  PHQ - 2 Score 0 0 0 2 2 0 0  PHQ- 9 Score - - - 6 6 - -    Assessment: Patient continues to benefit from health coach outreach for disease management and support.    Plan:  [facility] CM Care Plan Problem One        Most Recent Value   Care Plan Problem One  knowledge deficit related to HF diagnosis   Role Documenting the  Problem One  [place] for Problem One  Active   THN Long Term Goal (31-90 days)  Patient will not have any HF exacerbations in next 90 days   THN Long Term Goal Start Date  07/02/15 [goal continued]   Interventions for Problem One Long Term Goal  RN Health Coach reviewed signs and symptoms of heart failure.   THN CM Short Term Goal #1 (0-30 days)  Patient will follow low salt diet for next 30 days   THN CM Short Term Goal #1 Start Date  08/19/15 [goal continued]   Interventions for Short Term Goal #1  RN Health Coach reviewed with patient low sodium diet.     THN CM Care Plan Problem Three        Most Recent Value   THN CM Short Term Goal #1 (0-30 days)  Patient will have someone review medications with health coach within the next 30 days   THN CM Short Term Goal #1 Start Date  07/23/15 [goal continued]   [facility] CM Short Term Goal #1 Met Date  08/19/15   Interventions for Short Term Goal #1  Medication review completed.       RN Health Coach will contact patient within the next month and patient agrees to next outreach.    [name], RN, MSN [place] [phone]

## 2015-08-29 DIAGNOSIS — J449 Chronic obstructive pulmonary disease, unspecified: Secondary | ICD-10-CM | POA: Diagnosis not present

## 2015-09-16 ENCOUNTER — Other Ambulatory Visit: Payer: Self-pay

## 2015-09-16 NOTE — Patient Outreach (Signed)
[place] [facility]) Care Management  [place]  09/16/2015   Alexis Dillon 03-07-1946 [id number]  Subjective: Telephone call to patient for monthly call.  Patient report she is doing ok.  She reports that her weights are in the 80's. Discussed with patient signs and symptoms of heart failure and importance of a low salt diet.  She verbalized understanding.  No concerns.   Objective:   Encounter Medications:  Outpatient Encounter Prescriptions as of 09/16/2015  Medication Sig Note  . acetaminophen (TYLENOL) 325 MG tablet Take 2 tablets (650 mg total) by mouth every 6 (six) hours as needed for mild pain or fever.   Marland Kitchen albuterol (PROVENTIL HFA;VENTOLIN HFA) 108 (90 BASE) MCG/ACT inhaler Inhale 2 puffs into the lungs every 6 (six) hours as needed for wheezing or shortness of breath. 03/12/2015: Using as needed  . albuterol (PROVENTIL) (2.5 MG/3ML) 0.083% nebulizer solution Take 3 mLs (2.5 mg total) by nebulization every 2 (two) hours as needed for wheezing or shortness of breath.   . ALPRAZolam (XANAX) 0.25 MG tablet Take 0.25 mg by mouth at bedtime as needed for anxiety.  10/07/2014: Sister gives only as needed, does not use regularly  . amLODipine (NORVASC) 2.5 MG tablet Take 1 tablet (2.5 mg total) by mouth daily.   Marland Kitchen aspirin EC 81 MG tablet Take 1 tablet (81 mg total) by mouth daily.   Marland Kitchen atorvastatin (LIPITOR) 80 MG tablet Take 80 mg by mouth daily. 02/22/2015: Verify the date. Medication provided was dated 09/2014  . carvedilol (COREG) 6.25 MG tablet Take 1 tablet (6.25 mg total) by mouth 2 (two) times daily with a meal. 03/12/2015: Unable to verify, no bottle and nephew not available  . cholecalciferol (VITAMIN D) 1000 UNITS tablet Take 2,000 Units by mouth daily. 03/12/2015: Unable to verify, nephew not available  . furosemide (LASIX) 20 MG tablet Take 2 tablets (40 mg total) by mouth daily. 03/12/2015: Patient reports she is taking, but no bottle and nephew not available to  verify   . HYDROcodone-acetaminophen (NORCO/VICODIN) 5-325 MG per tablet Take 1 tablet by mouth 2 (two) times daily as needed for moderate pain.  03/12/2015: Unable to verify, nephew not available to verify, no bottle   . ipratropium-albuterol (DUONEB) 0.5-2.5 (3) MG/3ML SOLN Take 3 mLs by nebulization every 6 (six) hours as needed (SHORTNESS OF BREATH). 10/07/2014: Sometimes uses more than 4x a day  . isosorbide mononitrate (IMDUR) 30 MG 24 hr tablet Take 1 tablet (30 mg total) by mouth daily.   Marland Kitchen NITROSTAT 0.4 MG SL tablet Take 1 tablet by mouth daily as needed for chest pain.    . pantoprazole (PROTONIX) 40 MG tablet Take 40 mg by mouth daily.   . potassium chloride (K-DUR) 10 MEQ tablet Take 1 tablet (10 mEq total) by mouth 2 (two) times daily.   . sertraline (ZOLOFT) 50 MG tablet Take 50 mg by mouth daily.     No facility-administered encounter medications on file as of 09/16/2015.    Functional Status:  In your present state of health, do you have any difficulty performing the following activities: 06/25/2015 04/14/2015  Hearing? N N  Vision? N N  Difficulty concentrating or making decisions? Y N  Walking or climbing stairs? Y Y  Dressing or bathing? N N  Doing errands, shopping? [name]  Preparing Food and eating ? Y Y  Using the Toilet? N N  In the past six months, have you accidently leaked urine? N N  Do you  have problems with loss of bowel control? N N  Managing your Medications? Y Y  Managing your Finances? [name]  Housekeeping or managing your Housekeeping? [name]    Fall/Depression Screening: PHQ 2/9 Scores 09/16/2015 08/19/2015 07/23/2015 07/02/2015 06/25/2015 06/12/2015 05/27/2015  PHQ - 2 Score 0 0 0 0 2 2 0  PHQ- 9 Score - - - - 6 6 -    Assessment: Patient continues to benefit from health coach outreach for disease management and support.  Plan:  [facility] CM Care Plan Problem One        Most Recent Value   Care Plan Problem One  knowledge deficit related to HF diagnosis   Role  Documenting the Problem One  [place] for Problem One  Active   THN Long Term Goal (31-90 days)  Patient will not have any HF exacerbations in next 90 days   THN Long Term Goal Start Date  09/16/15 [goal continued]   Interventions for Problem One Long Term Goal  RN Health Coach reiterated signs and symptoms of heart failure.   THN CM Short Term Goal #1 (0-30 days)  Patient will follow low salt diet for next 30 days   THN CM Short Term Goal #1 Start Date  09/16/15 [goal continued]   Interventions for Short Term Goal #1  RN Health Coach reiterated with patient the importance of a low salt diet.    THN CM Care Plan Problem Three        Most Recent Value   THN CM Short Term Goal #1 (0-30 days)  Patient will have someone review medications with health coach within the next 30 days   THN CM Short Term Goal #1 Start Date  07/23/15 [goal continued]   [facility] CM Short Term Goal #1 Met Date  08/19/15   Interventions for Short Term Goal #1  Medication review completed.      RN Health Coach will contact patient within one month and patient agrees to next outreach.    [name], RN, MSN [place] [phone]

## 2015-09-28 DIAGNOSIS — J449 Chronic obstructive pulmonary disease, unspecified: Secondary | ICD-10-CM | POA: Diagnosis not present

## 2015-10-15 ENCOUNTER — Other Ambulatory Visit: Payer: Self-pay

## 2015-10-15 ENCOUNTER — Ambulatory Visit: Payer: Self-pay

## 2015-10-15 NOTE — Patient Outreach (Signed)
[place] [facility]) Care Management  10/15/2015  Alexis Dillon 11-03-1945 [id number]  Telephone call to patient for monthly call. No answer. Unable to leave a message.    Plan: RN Health Coach will attempt patient within 1-2 weeks.   [name], RN, MSN [place] [phone]

## 2015-10-24 ENCOUNTER — Other Ambulatory Visit: Payer: Self-pay

## 2015-10-24 NOTE — Patient Outreach (Signed)
[place] [facility]) Care Management  [place]  10/24/2015   Alexis Dillon [birth year] [id number]  Subjective: Telephone call to patient for monthly call. Patient reports she is hanging in there. She reports that her weights are in the 80's.  No swelling of shortness of breath. Discussed with patient heart failure action plan and when to notify physician.  She verbalized understanding. No concerns.   Objective:   Encounter Medications:  Outpatient Encounter Prescriptions as of 10/24/2015  Medication Sig Note  . acetaminophen (TYLENOL) 325 MG tablet Take 2 tablets (650 mg total) by mouth every 6 (six) hours as needed for mild pain or fever.   Marland Kitchen albuterol (PROVENTIL HFA;VENTOLIN HFA) 108 (90 BASE) MCG/ACT inhaler Inhale 2 puffs into the lungs every 6 (six) hours as needed for wheezing or shortness of breath. 03/12/2015: Using as needed  . albuterol (PROVENTIL) (2.5 MG/3ML) 0.083% nebulizer solution Take 3 mLs (2.5 mg total) by nebulization every 2 (two) hours as needed for wheezing or shortness of breath.   . ALPRAZolam (XANAX) 0.25 MG tablet Take 0.25 mg by mouth at bedtime as needed for anxiety.  10/07/2014: Sister gives only as needed, does not use regularly  . amLODipine (NORVASC) 2.5 MG tablet Take 1 tablet (2.5 mg total) by mouth daily.   Marland Kitchen aspirin EC 81 MG tablet Take 1 tablet (81 mg total) by mouth daily.   Marland Kitchen atorvastatin (LIPITOR) 80 MG tablet Take 80 mg by mouth daily. 02/22/2015: Verify the date. Medication provided was dated 09/2014  . carvedilol (COREG) 6.25 MG tablet Take 1 tablet (6.25 mg total) by mouth 2 (two) times daily with a meal. 03/12/2015: Unable to verify, no bottle and nephew not available  . cholecalciferol (VITAMIN D) 1000 UNITS tablet Take 2,000 Units by mouth daily. 03/12/2015: Unable to verify, nephew not available  . furosemide (LASIX) 20 MG tablet Take 2 tablets (40 mg total) by mouth daily. 03/12/2015: Patient reports she is taking, but no bottle and  nephew not available to verify   . HYDROcodone-acetaminophen (NORCO/VICODIN) 5-325 MG per tablet Take 1 tablet by mouth 2 (two) times daily as needed for moderate pain.  03/12/2015: Unable to verify, nephew not available to verify, no bottle   . ipratropium-albuterol (DUONEB) 0.5-2.5 (3) MG/3ML SOLN Take 3 mLs by nebulization every 6 (six) hours as needed (SHORTNESS OF BREATH). 10/07/2014: Sometimes uses more than 4x a day  . isosorbide mononitrate (IMDUR) 30 MG 24 hr tablet Take 1 tablet (30 mg total) by mouth daily.   Marland Kitchen NITROSTAT 0.4 MG SL tablet Take 1 tablet by mouth daily as needed for chest pain.    . pantoprazole (PROTONIX) 40 MG tablet Take 40 mg by mouth daily.   . potassium chloride (K-DUR) 10 MEQ tablet Take 1 tablet (10 mEq total) by mouth 2 (two) times daily.   . sertraline (ZOLOFT) 50 MG tablet Take 50 mg by mouth daily.     No facility-administered encounter medications on file as of 10/24/2015.    Functional Status:  In your present state of health, do you have any difficulty performing the following activities: 06/25/2015 04/14/2015  Hearing? N N  Vision? N N  Difficulty concentrating or making decisions? Y N  Walking or climbing stairs? Y Y  Dressing or bathing? N N  Doing errands, shopping? [name]  Preparing Food and eating ? Y Y  Using the Toilet? N N  In the past six months, have you accidently leaked urine? N N  Do  you have problems with loss of bowel control? N N  Managing your Medications? Y Y  Managing your Finances? [name]  Housekeeping or managing your Housekeeping? [name]    Fall/Depression Screening: PHQ 2/9 Scores 09/16/2015 08/19/2015 07/23/2015 07/02/2015 06/25/2015 06/12/2015 05/27/2015  PHQ - 2 Score 0 0 0 0 2 2 0  PHQ- 9 Score - - - - 6 6 -    Assessment:  Patient continues to benefit from health coach outreach for disease management and support.    Plan:  [facility] CM Care Plan Problem One        Most Recent Value   Care Plan Problem One  knowledge deficit related to HF  diagnosis   Role Documenting the Problem One  [place] for Problem One  Active   THN Long Term Goal (31-90 days)  Patient will not have any HF exacerbations in next 90 days   THN Long Term Goal Start Date  09/16/15 [goal continued]   Interventions for Problem One Long Term Goal  RN Health Coach reinforced signs and symptoms of heart failure.   THN CM Short Term Goal #1 (0-30 days)  Patient will follow low salt diet for next 30 days   THN CM Short Term Goal #1 Start Date  10/24/15 Barrie Folk continued]   Interventions for Short Term Goal #1  RN Health Coach reinforced with patient the importance of a low salt diet.      RN Health Coach will contact patient within one month and patient agrees to next outreach.  [name], RN, MSN [place] [phone]

## 2015-10-29 DIAGNOSIS — J449 Chronic obstructive pulmonary disease, unspecified: Secondary | ICD-10-CM | POA: Diagnosis not present

## 2015-11-04 DIAGNOSIS — B351 Tinea unguium: Secondary | ICD-10-CM | POA: Diagnosis not present

## 2015-11-04 DIAGNOSIS — M79676 Pain in unspecified toe(s): Secondary | ICD-10-CM | POA: Diagnosis not present

## 2015-11-21 ENCOUNTER — Other Ambulatory Visit: Payer: Self-pay

## 2015-11-21 NOTE — Patient Outreach (Signed)
[place] [facility]) Care Management  11/21/2015  Alexis Dillon 06-Sep-1945 [id number]  Telephone call to patient for monthly call.  No answer.  HIPAA compliant voice message left.  Plan: RN Health Coach will attempt again in the month of July.  [name], RN, MSN [place] [phone]

## 2015-11-28 DIAGNOSIS — J449 Chronic obstructive pulmonary disease, unspecified: Secondary | ICD-10-CM | POA: Diagnosis not present

## 2015-12-05 ENCOUNTER — Other Ambulatory Visit: Payer: Self-pay

## 2015-12-05 NOTE — Patient Outreach (Signed)
[place] [facility]) Care Management  [place]  12/05/2015   Burke Heise 1945-12-25 [id number]  Subjective: Telephone call to patient for monthly call. She reports that she turned her left foot about 2 days ago.  She states that she has been putting ice to area. Patient reports some puffiness to area but has been elevating it.  Patient denies bruising. Advised patient that if area gets no better she will need to see her primary care doctor.  She verbalized understanding.  Patient reports weigh staying in the 80's.  Today's weight was 88 lbs. Discussed with patient signs and symptoms of heart failure and when to notify physician.  She verbalized understanding.    Objective:   Encounter Medications:  Outpatient Encounter Prescriptions as of 12/05/2015  Medication Sig Note  . acetaminophen (TYLENOL) 325 MG tablet Take 2 tablets (650 mg total) by mouth every 6 (six) hours as needed for mild pain or fever.   Marland Kitchen albuterol (PROVENTIL HFA;VENTOLIN HFA) 108 (90 BASE) MCG/ACT inhaler Inhale 2 puffs into the lungs every 6 (six) hours as needed for wheezing or shortness of breath. 03/12/2015: Using as needed  . albuterol (PROVENTIL) (2.5 MG/3ML) 0.083% nebulizer solution Take 3 mLs (2.5 mg total) by nebulization every 2 (two) hours as needed for wheezing or shortness of breath.   . ALPRAZolam (XANAX) 0.25 MG tablet Take 0.25 mg by mouth at bedtime as needed for anxiety.  10/07/2014: Sister gives only as needed, does not use regularly  . amLODipine (NORVASC) 2.5 MG tablet Take 1 tablet (2.5 mg total) by mouth daily.   Marland Kitchen aspirin EC 81 MG tablet Take 1 tablet (81 mg total) by mouth daily.   Marland Kitchen atorvastatin (LIPITOR) 80 MG tablet Take 80 mg by mouth daily. 02/22/2015: Verify the date. Medication provided was dated 09/2014  . carvedilol (COREG) 6.25 MG tablet Take 1 tablet (6.25 mg total) by mouth 2 (two) times daily with a meal. 03/12/2015: Unable to verify, no bottle and nephew not available  .  cholecalciferol (VITAMIN D) 1000 UNITS tablet Take 2,000 Units by mouth daily. 03/12/2015: Unable to verify, nephew not available  . furosemide (LASIX) 20 MG tablet Take 2 tablets (40 mg total) by mouth daily. 03/12/2015: Patient reports she is taking, but no bottle and nephew not available to verify   . ipratropium-albuterol (DUONEB) 0.5-2.5 (3) MG/3ML SOLN Take 3 mLs by nebulization every 6 (six) hours as needed (SHORTNESS OF BREATH). 10/07/2014: Sometimes uses more than 4x a day  . isosorbide mononitrate (IMDUR) 30 MG 24 hr tablet Take 1 tablet (30 mg total) by mouth daily.   Marland Kitchen NITROSTAT 0.4 MG SL tablet Take 1 tablet by mouth daily as needed for chest pain.    . pantoprazole (PROTONIX) 40 MG tablet Take 40 mg by mouth daily.   . potassium chloride (K-DUR) 10 MEQ tablet Take 1 tablet (10 mEq total) by mouth 2 (two) times daily.   . sertraline (ZOLOFT) 50 MG tablet Take 50 mg by mouth daily.    Marland Kitchen HYDROcodone-acetaminophen (NORCO/VICODIN) 5-325 MG per tablet Take 1 tablet by mouth 2 (two) times daily as needed for moderate pain. Reported on 12/05/2015 03/12/2015: Unable to verify, nephew not available to verify, no bottle    No facility-administered encounter medications on file as of 12/05/2015.    Functional Status:  In your present state of health, do you have any difficulty performing the following activities: 06/25/2015 04/14/2015  Hearing? N N  Vision? N N  Difficulty concentrating  or making decisions? Y N  Walking or climbing stairs? Y Y  Dressing or bathing? N N  Doing errands, shopping? [name]  Preparing Food and eating ? Y Y  Using the Toilet? N N  In the past six months, have you accidently leaked urine? N N  Do you have problems with loss of bowel control? N N  Managing your Medications? Y Y  Managing your Finances? [name]  Housekeeping or managing your Housekeeping? [name]    Fall/Depression Screening: PHQ 2/9 Scores 12/05/2015 09/16/2015 08/19/2015 07/23/2015 07/02/2015 06/25/2015 06/12/2015   PHQ - 2 Score 0 0 0 0 0 2 2  PHQ- 9 Score - - - - - 6 6    Assessment: Patient continues to benefit from health coach outreach for disease management and support.     Plan:  [facility] CM Care Plan Problem One        Most Recent Value   Care Plan Problem One  knowledge deficit related to HF diagnosis   Role Documenting the Problem One  [place] for Problem One  Active   THN Long Term Goal (31-90 days)  Patient will not have any HF exacerbations in next 90 days   THN Long Term Goal Start Date  09/16/15 [goal continued]   Interventions for Problem One Long Term Goal  RN Health Coach reiterated signs and symptoms of heart failure.   THN CM Short Term Goal #1 (0-30 days)  Patient will follow low salt diet for next 30 days   THN CM Short Term Goal #1 Start Date  12/05/15 Barrie Folk continued]   Interventions for Short Term Goal #1  RN Health Coach reiterated with patient the importance of a low salt diet.      RN Health Coach will contact patient in the month of August and patient agrees to next outreach.  [name], RN, MSN [place] [phone]

## 2015-12-08 DIAGNOSIS — J9611 Chronic respiratory failure with hypoxia: Secondary | ICD-10-CM | POA: Diagnosis not present

## 2015-12-08 DIAGNOSIS — J449 Chronic obstructive pulmonary disease, unspecified: Secondary | ICD-10-CM | POA: Diagnosis not present

## 2015-12-08 DIAGNOSIS — I4891 Unspecified atrial fibrillation: Secondary | ICD-10-CM | POA: Diagnosis not present

## 2015-12-08 DIAGNOSIS — I2510 Atherosclerotic heart disease of native coronary artery without angina pectoris: Secondary | ICD-10-CM | POA: Diagnosis not present

## 2015-12-17 ENCOUNTER — Other Ambulatory Visit (HOSPITAL_COMMUNITY): Payer: Self-pay | Admitting: Pulmonary Disease

## 2015-12-17 DIAGNOSIS — Z1231 Encounter for screening mammogram for malignant neoplasm of breast: Secondary | ICD-10-CM

## 2015-12-29 DIAGNOSIS — J449 Chronic obstructive pulmonary disease, unspecified: Secondary | ICD-10-CM | POA: Diagnosis not present

## 2016-01-01 ENCOUNTER — Ambulatory Visit (HOSPITAL_COMMUNITY)
Admission: RE | Admit: 2016-01-01 | Discharge: 2016-01-01 | Disposition: A | Discharge status: Home / Self Care | Payer: Medicare Other | Source: Ambulatory Visit | Attending: Pulmonary Disease | Admitting: Pulmonary Disease

## 2016-01-01 DIAGNOSIS — Z1231 Encounter for screening mammogram for malignant neoplasm of breast: Secondary | ICD-10-CM

## 2016-01-02 ENCOUNTER — Other Ambulatory Visit: Payer: Self-pay

## 2016-01-02 NOTE — Patient Outreach (Signed)
[place] [facility]) Care Management  01/02/2016  Coreen Petriello 1945/07/13 [id number]   Telephone call to patient for monthly call.  No answer. HIPAA compliant voice message left.    Plan: RN Health Coach will attempt patient again in the month of August.    Mehdi Gironda J Tavi Gaughran, RN, MSN [place] [phone]

## 2016-01-07 ENCOUNTER — Other Ambulatory Visit: Payer: Self-pay

## 2016-01-07 NOTE — Patient Outreach (Signed)
[place] [facility]) Care Management  [place]  01/07/2016   Alexis Dillon July 26, 1945 [id number]  Subjective: Telephone call to patient for monthly call.  Patient reports she is doing about the same.  Patient reports she continues to weigh and weights around 88 lbs.  Reinforced with patient the importance of low salt diet and watching for signs and symptoms of heart failure.  She verbalized understanding.   Objective:   Encounter Medications:  Outpatient Encounter Prescriptions as of 01/07/2016  Medication Sig Note  . acetaminophen (TYLENOL) 325 MG tablet Take 2 tablets (650 mg total) by mouth every 6 (six) hours as needed for mild pain or fever.   Marland Kitchen albuterol (PROVENTIL HFA;VENTOLIN HFA) 108 (90 BASE) MCG/ACT inhaler Inhale 2 puffs into the lungs every 6 (six) hours as needed for wheezing or shortness of breath. 03/12/2015: Using as needed  . albuterol (PROVENTIL) (2.5 MG/3ML) 0.083% nebulizer solution Take 3 mLs (2.5 mg total) by nebulization every 2 (two) hours as needed for wheezing or shortness of breath.   . ALPRAZolam (XANAX) 0.25 MG tablet Take 0.25 mg by mouth at bedtime as needed for anxiety.  10/07/2014: Sister gives only as needed, does not use regularly  . amLODipine (NORVASC) 2.5 MG tablet Take 1 tablet (2.5 mg total) by mouth daily.   Marland Kitchen aspirin EC 81 MG tablet Take 1 tablet (81 mg total) by mouth daily.   Marland Kitchen atorvastatin (LIPITOR) 80 MG tablet Take 80 mg by mouth daily. 02/22/2015: Verify the date. Medication provided was dated 09/2014  . carvedilol (COREG) 6.25 MG tablet Take 1 tablet (6.25 mg total) by mouth 2 (two) times daily with a meal. 03/12/2015: Unable to verify, no bottle and nephew not available  . cholecalciferol (VITAMIN D) 1000 UNITS tablet Take 2,000 Units by mouth daily. 03/12/2015: Unable to verify, nephew not available  . furosemide (LASIX) 20 MG tablet Take 2 tablets (40 mg total) by mouth daily. 03/12/2015: Patient reports she is taking, but no  bottle and nephew not available to verify   . HYDROcodone-acetaminophen (NORCO/VICODIN) 5-325 MG per tablet Take 1 tablet by mouth 2 (two) times daily as needed for moderate pain. Reported on 12/05/2015 03/12/2015: Unable to verify, nephew not available to verify, no bottle   . ipratropium-albuterol (DUONEB) 0.5-2.5 (3) MG/3ML SOLN Take 3 mLs by nebulization every 6 (six) hours as needed (SHORTNESS OF BREATH). 10/07/2014: Sometimes uses more than 4x a day  . isosorbide mononitrate (IMDUR) 30 MG 24 hr tablet Take 1 tablet (30 mg total) by mouth daily.   Marland Kitchen NITROSTAT 0.4 MG SL tablet Take 1 tablet by mouth daily as needed for chest pain.    . pantoprazole (PROTONIX) 40 MG tablet Take 40 mg by mouth daily.   . potassium chloride (K-DUR) 10 MEQ tablet Take 1 tablet (10 mEq total) by mouth 2 (two) times daily.   . sertraline (ZOLOFT) 50 MG tablet Take 50 mg by mouth daily.     No facility-administered encounter medications on file as of 01/07/2016.     Functional Status:  In your present state of health, do you have any difficulty performing the following activities: 06/25/2015 04/14/2015  Hearing? N N  Vision? N N  Difficulty concentrating or making decisions? Y N  Walking or climbing stairs? Y Y  Dressing or bathing? N N  Doing errands, shopping? [name]  Preparing Food and eating ? Y Y  Using the Toilet? N N  In the past six months, have you accidently  leaked urine? N N  Do you have problems with loss of bowel control? N N  Managing your Medications? Y Y  Managing your Finances? [name]  Housekeeping or managing your Housekeeping? [name]  Some recent data might be hidden    Fall/Depression Screening: PHQ 2/9 Scores 12/05/2015 09/16/2015 08/19/2015 07/23/2015 07/02/2015 06/25/2015 06/12/2015  PHQ - 2 Score 0 0 0 0 0 2 2  PHQ- 9 Score - - - - - 6 6    Assessment: Patient continues to benefit from health coach outreach for disease management and support.    Plan:  [facility] CM Care Plan Problem One   Flowsheet Row  Most Recent Value  Care Plan Problem One  knowledge deficit related to HF diagnosis  Role Documenting the Problem One  [place] for Problem One  Active  THN Long Term Goal (31-90 days)  Patient will not have any HF exacerbations in next 90 days  THN Long Term Goal Start Date  01/07/16 [goal continued]  Interventions for Problem One Long Term Goal  RN Health Coach reinforced signs and symptoms of heart failure.  THN CM Short Term Goal #1 (0-30 days)  Patient will follow low salt diet for next 30 days  THN CM Short Term Goal #1 Start Date  01/07/16 [goal continued]  Interventions for Short Term Goal #1  RN Health Coach reinforced with patient the importance of a low salt diet.     RN Health Coach will contact patient in the month of September and patient agrees to next outreach.  [name], RN, MSN [place] [phone]

## 2016-01-13 DIAGNOSIS — M79676 Pain in unspecified toe(s): Secondary | ICD-10-CM | POA: Diagnosis not present

## 2016-01-13 DIAGNOSIS — B351 Tinea unguium: Secondary | ICD-10-CM | POA: Diagnosis not present

## 2016-01-28 ENCOUNTER — Other Ambulatory Visit: Payer: Self-pay

## 2016-01-28 NOTE — Patient Outreach (Signed)
[place] [facility]) Care Management  [place]  01/28/2016   Alexis Dillon April 06, 1946 [id number]  Subjective: Telephone call to patient for monthly call. Patient reports she is doing good.  Asked patient about her last primary doctor visit. Patient unsure of last visit but states she goes every three months.  Patient reports that her weight remains in the 80's.  Discussed continuing low salt diet and heart failure signs and symptoms.  She verbalized understanding.    Objective:   Encounter Medications:  Outpatient Encounter Prescriptions as of 01/28/2016  Medication Sig Note  . acetaminophen (TYLENOL) 325 MG tablet Take 2 tablets (650 mg total) by mouth every 6 (six) hours as needed for mild pain or fever.   Marland Kitchen albuterol (PROVENTIL HFA;VENTOLIN HFA) 108 (90 BASE) MCG/ACT inhaler Inhale 2 puffs into the lungs every 6 (six) hours as needed for wheezing or shortness of breath. 03/12/2015: Using as needed  . albuterol (PROVENTIL) (2.5 MG/3ML) 0.083% nebulizer solution Take 3 mLs (2.5 mg total) by nebulization every 2 (two) hours as needed for wheezing or shortness of breath.   . ALPRAZolam (XANAX) 0.25 MG tablet Take 0.25 mg by mouth at bedtime as needed for anxiety.  10/07/2014: Sister gives only as needed, does not use regularly  . amLODipine (NORVASC) 2.5 MG tablet Take 1 tablet (2.5 mg total) by mouth daily.   Marland Kitchen aspirin EC 81 MG tablet Take 1 tablet (81 mg total) by mouth daily.   Marland Kitchen atorvastatin (LIPITOR) 80 MG tablet Take 80 mg by mouth daily. 02/22/2015: Verify the date. Medication provided was dated 09/2014  . carvedilol (COREG) 6.25 MG tablet Take 1 tablet (6.25 mg total) by mouth 2 (two) times daily with a meal. 03/12/2015: Unable to verify, no bottle and nephew not available  . cholecalciferol (VITAMIN D) 1000 UNITS tablet Take 2,000 Units by mouth daily. 03/12/2015: Unable to verify, nephew not available  . furosemide (LASIX) 20 MG tablet Take 2 tablets (40 mg total) by  mouth daily. 03/12/2015: Patient reports she is taking, but no bottle and nephew not available to verify   . HYDROcodone-acetaminophen (NORCO/VICODIN) 5-325 MG per tablet Take 1 tablet by mouth 2 (two) times daily as needed for moderate pain. Reported on 12/05/2015 03/12/2015: Unable to verify, nephew not available to verify, no bottle   . ipratropium-albuterol (DUONEB) 0.5-2.5 (3) MG/3ML SOLN Take 3 mLs by nebulization every 6 (six) hours as needed (SHORTNESS OF BREATH). 10/07/2014: Sometimes uses more than 4x a day  . isosorbide mononitrate (IMDUR) 30 MG 24 hr tablet Take 1 tablet (30 mg total) by mouth daily.   Marland Kitchen NITROSTAT 0.4 MG SL tablet Take 1 tablet by mouth daily as needed for chest pain.    . pantoprazole (PROTONIX) 40 MG tablet Take 40 mg by mouth daily.   . potassium chloride (K-DUR) 10 MEQ tablet Take 1 tablet (10 mEq total) by mouth 2 (two) times daily.   . sertraline (ZOLOFT) 50 MG tablet Take 50 mg by mouth daily.     No facility-administered encounter medications on file as of 01/28/2016.     Functional Status:  In your present state of health, do you have any difficulty performing the following activities: 06/25/2015 04/14/2015  Hearing? N N  Vision? N N  Difficulty concentrating or making decisions? Y N  Walking or climbing stairs? Y Y  Dressing or bathing? N N  Doing errands, shopping? [name]  Preparing Food and eating ? Y Y  Using the Toilet? N  N  In the past six months, have you accidently leaked urine? N N  Do you have problems with loss of bowel control? N N  Managing your Medications? Y Y  Managing your Finances? [name]  Housekeeping or managing your Housekeeping? [name]  Some recent data might be hidden    Fall/Depression Screening: PHQ 2/9 Scores 01/28/2016 12/05/2015 09/16/2015 08/19/2015 07/23/2015 07/02/2015 06/25/2015  PHQ - 2 Score 0 0 0 0 0 0 2  PHQ- 9 Score - - - - - - 6    Assessment: Patient continues to benefit from health coach outreach for disease management and  support.    Plan:  [facility] CM Care Plan Problem One   Flowsheet Row Most Recent Value  Care Plan Problem One  knowledge deficit related to HF diagnosis  Role Documenting the Problem One  [facility] [place] for Problem One  Active  THN Long Term Goal (31-90 days)  Patient will not have any HF exacerbations in next 90 days  THN Long Term Goal Start Date  01/28/16 [goal continued]  Interventions for Problem One Long Term Goal  RN Health Coach discussed signs and symptoms of heart failure.  THN CM Short Term Goal #1 (0-30 days)  Patient will follow low salt diet for next 30 days  THN CM Short Term Goal #1 Start Date  01/28/16 Barrie Folk continued]  Interventions for Short Term Goal #1  RN Health Coach discussed with patient the importance of a low salt diet.      RN Health Coach will contact patient in the month of October and patient agrees to next outreach.  [name], RN, MSN [place] [phone]

## 2016-01-29 DIAGNOSIS — J449 Chronic obstructive pulmonary disease, unspecified: Secondary | ICD-10-CM | POA: Diagnosis not present

## 2016-02-24 ENCOUNTER — Other Ambulatory Visit: Payer: Self-pay

## 2016-02-24 NOTE — Patient Outreach (Signed)
[place] [facility]) Care Management  02/24/2016  Alexis Dillon Dec 22, 1945 [id number]   Telephone call to patient for monthly call.  No answer. HIPAA compliant voice message left.  Plan: RN Health coach will attempt patient again in the month of October.  [name], RN, MSN [place] [phone]

## 2016-03-11 ENCOUNTER — Other Ambulatory Visit: Payer: Self-pay

## 2016-03-11 NOTE — Patient Outreach (Signed)
[facility]) Care Management  [place]  03/11/2016   Alexis Dillon 06-14-1945 [id number]  Subjective: Telephone call to patient for monthly outreach.  Patient reports she is doing ok.  Patient cannot remember when the last time she saw her primary care doctor.  Discussed with patient the importance of seeing primary care doctor regularly. She verbalized understanding. Patient weight continues to remain stable at 88 lbs.  Discussed with patient heart failure and when to notify physician.  She verbalized understanding.    Objective:   Encounter Medications:  Outpatient Encounter Prescriptions as of 03/11/2016  Medication Sig Note  . acetaminophen (TYLENOL) 325 MG tablet Take 2 tablets (650 mg total) by mouth every 6 (six) hours as needed for mild pain or fever.   Marland Kitchen albuterol (PROVENTIL HFA;VENTOLIN HFA) 108 (90 BASE) MCG/ACT inhaler Inhale 2 puffs into the lungs every 6 (six) hours as needed for wheezing or shortness of breath. 03/12/2015: Using as needed  . albuterol (PROVENTIL) (2.5 MG/3ML) 0.083% nebulizer solution Take 3 mLs (2.5 mg total) by nebulization every 2 (two) hours as needed for wheezing or shortness of breath.   . ALPRAZolam (XANAX) 0.25 MG tablet Take 0.25 mg by mouth at bedtime as needed for anxiety.  10/07/2014: Sister gives only as needed, does not use regularly  . amLODipine (NORVASC) 2.5 MG tablet Take 1 tablet (2.5 mg total) by mouth daily.   Marland Kitchen aspirin EC 81 MG tablet Take 1 tablet (81 mg total) by mouth daily.   Marland Kitchen atorvastatin (LIPITOR) 80 MG tablet Take 80 mg by mouth daily. 02/22/2015: Verify the date. Medication provided was dated 09/2014  . carvedilol (COREG) 6.25 MG tablet Take 1 tablet (6.25 mg total) by mouth 2 (two) times daily with a meal. 03/12/2015: Unable to verify, no bottle and nephew not available  . cholecalciferol (VITAMIN D) 1000 UNITS tablet Take 2,000 Units by mouth daily. 03/12/2015: Unable to verify, nephew not available  .  furosemide (LASIX) 20 MG tablet Take 2 tablets (40 mg total) by mouth daily. 03/12/2015: Patient reports she is taking, but no bottle and nephew not available to verify   . HYDROcodone-acetaminophen (NORCO/VICODIN) 5-325 MG per tablet Take 1 tablet by mouth 2 (two) times daily as needed for moderate pain. Reported on 12/05/2015 03/12/2015: Unable to verify, nephew not available to verify, no bottle   . ipratropium-albuterol (DUONEB) 0.5-2.5 (3) MG/3ML SOLN Take 3 mLs by nebulization every 6 (six) hours as needed (SHORTNESS OF BREATH). 10/07/2014: Sometimes uses more than 4x a day  . isosorbide mononitrate (IMDUR) 30 MG 24 hr tablet Take 1 tablet (30 mg total) by mouth daily.   Marland Kitchen NITROSTAT 0.4 MG SL tablet Take 1 tablet by mouth daily as needed for chest pain.    . pantoprazole (PROTONIX) 40 MG tablet Take 40 mg by mouth daily.   . potassium chloride (K-DUR) 10 MEQ tablet Take 1 tablet (10 mEq total) by mouth 2 (two) times daily.   . sertraline (ZOLOFT) 50 MG tablet Take 50 mg by mouth daily.     No facility-administered encounter medications on file as of 03/11/2016.     Functional Status:  In your present state of health, do you have any difficulty performing the following activities: 06/25/2015 04/14/2015  Hearing? N N  Vision? N N  Difficulty concentrating or making decisions? Y N  Walking or climbing stairs? Y Y  Dressing or bathing? N N  Doing errands, shopping? [name]  Preparing Food and eating ?  Y Y  Using the Toilet? N N  In the past six months, have you accidently leaked urine? N N  Do you have problems with loss of bowel control? N N  Managing your Medications? Y Y  Managing your Finances? [name]  Housekeeping or managing your Housekeeping? [name]  Some recent data might be hidden    Fall/Depression Screening: PHQ 2/9 Scores 03/11/2016 01/28/2016 12/05/2015 09/16/2015 08/19/2015 07/23/2015 07/02/2015  PHQ - 2 Score 0 0 0 0 0 0 0  PHQ- 9 Score - - - - - - -    Assessment: Patient continues to  benefit from health coach outreach for disease management and support.    Plan:  [facility] CM Care Plan Problem One   Flowsheet Row Most Recent Value  Care Plan Problem One  knowledge deficit related to HF diagnosis  Role Documenting the Problem One  [place] for Problem One  Active  [facility]N Long Term Goal (31-90 days)  Patient will not have any HF exacerbations in next 90 days  THN Long Term Goal Start Date  03/11/16 [goal continued]  Interventions for Problem One Long Term Goal  RN Health Coach reviewed signs and symptoms of heart failure.  THN CM Short Term Goal #1 (0-30 days)  Patient will follow low salt diet for next 30 days  THN CM Short Term Goal #1 Start Date  03/11/16 [goal continued]  Interventions for Short Term Goal #1  RN Health Coach reviewed with patient the importance of a low salt diet.      RN Health Coach will contact patient in the month of November and patient agrees to next outreach.;  [name], RN, MSN [place] [phone]

## 2016-04-02 ENCOUNTER — Other Ambulatory Visit: Payer: Self-pay

## 2016-04-02 NOTE — Patient Outreach (Signed)
[facility]) Care Management  04/02/2016  Alexis Dillon 08-Jan-1946 [id number]   Telephone call to patient for monthly call.  No answer. HIPAA compliant voice message left.  Plan: RN Health Coach will contact patient again in the month of November.  [name], RN, MSN [place] [phone]

## 2016-04-15 ENCOUNTER — Other Ambulatory Visit: Payer: Self-pay

## 2016-04-15 NOTE — Patient Outreach (Signed)
[facility]) Care Management  04/15/2016  Stuart Paris [birth year] [id number]   2nd telephone call to patient for monthly call.  No answer.  HIPAA compliant voice message left.    Plan: RN Health Coach will attempt patient in the month of December.   [name], RN, MSN [place] [phone]

## 2016-04-30 ENCOUNTER — Other Ambulatory Visit: Payer: Self-pay

## 2016-04-30 NOTE — Patient Outreach (Signed)
[facility] [facility]) Care Management  04/30/2016  Ivry Danese 05/27/1945 [id number]   3rd telephone call to patient for monthly call.  No answer. HIPAA compliant voice message left.    Plan: RN Health Coach will send a letter to attempt outreach. If no answer in ten business days will proceed with case closure.  [name], RN, MSN [place] [phone]

## 2016-05-19 ENCOUNTER — Other Ambulatory Visit: Payer: Self-pay

## 2016-05-19 NOTE — Patient Outreach (Signed)
[place] [facility]) Care Management  05/19/2016  Alexis Dillon Sep 07, 1945 [id number]    Patient has not responded to calls or letter.  Will close case. Will send closure letter to patient and physician. Will notify Care management assistant of case status.  [name], RN, MSN [place] [phone]

## 2016-05-30 DIAGNOSIS — J449 Chronic obstructive pulmonary disease, unspecified: Secondary | ICD-10-CM | POA: Diagnosis not present

## 2016-06-22 DIAGNOSIS — B351 Tinea unguium: Secondary | ICD-10-CM | POA: Diagnosis not present

## 2016-06-22 DIAGNOSIS — M79676 Pain in unspecified toe(s): Secondary | ICD-10-CM | POA: Diagnosis not present

## 2016-06-30 DIAGNOSIS — J449 Chronic obstructive pulmonary disease, unspecified: Secondary | ICD-10-CM | POA: Diagnosis not present

## 2016-07-28 DIAGNOSIS — J449 Chronic obstructive pulmonary disease, unspecified: Secondary | ICD-10-CM | POA: Diagnosis not present

## 2016-08-23 DIAGNOSIS — Z23 Encounter for immunization: Secondary | ICD-10-CM | POA: Diagnosis not present

## 2016-08-23 DIAGNOSIS — J9611 Chronic respiratory failure with hypoxia: Secondary | ICD-10-CM | POA: Diagnosis not present

## 2016-08-23 DIAGNOSIS — J449 Chronic obstructive pulmonary disease, unspecified: Secondary | ICD-10-CM | POA: Diagnosis not present

## 2016-08-23 DIAGNOSIS — I10 Essential (primary) hypertension: Secondary | ICD-10-CM | POA: Diagnosis not present

## 2016-08-23 DIAGNOSIS — I2510 Atherosclerotic heart disease of native coronary artery without angina pectoris: Secondary | ICD-10-CM | POA: Diagnosis not present

## 2016-08-28 DIAGNOSIS — J449 Chronic obstructive pulmonary disease, unspecified: Secondary | ICD-10-CM | POA: Diagnosis not present

## 2016-08-31 DIAGNOSIS — M79676 Pain in unspecified toe(s): Secondary | ICD-10-CM | POA: Diagnosis not present

## 2016-08-31 DIAGNOSIS — B351 Tinea unguium: Secondary | ICD-10-CM | POA: Diagnosis not present

## 2016-09-27 DIAGNOSIS — J449 Chronic obstructive pulmonary disease, unspecified: Secondary | ICD-10-CM | POA: Diagnosis not present

## 2016-10-23 DIAGNOSIS — K219 Gastro-esophageal reflux disease without esophagitis: Secondary | ICD-10-CM | POA: Diagnosis not present

## 2016-10-23 DIAGNOSIS — R05 Cough: Secondary | ICD-10-CM | POA: Diagnosis not present

## 2016-10-23 DIAGNOSIS — J9622 Acute and chronic respiratory failure with hypercapnia: Secondary | ICD-10-CM | POA: Diagnosis not present

## 2016-10-23 DIAGNOSIS — N183 Chronic kidney disease, stage 3 (moderate): Secondary | ICD-10-CM | POA: Diagnosis not present

## 2016-10-23 DIAGNOSIS — E039 Hypothyroidism, unspecified: Secondary | ICD-10-CM | POA: Diagnosis not present

## 2016-10-23 DIAGNOSIS — Z681 Body mass index (BMI) 19 or less, adult: Secondary | ICD-10-CM | POA: Diagnosis not present

## 2016-10-23 DIAGNOSIS — F17210 Nicotine dependence, cigarettes, uncomplicated: Secondary | ICD-10-CM | POA: Diagnosis present

## 2016-10-23 DIAGNOSIS — E785 Hyperlipidemia, unspecified: Secondary | ICD-10-CM | POA: Diagnosis not present

## 2016-10-23 DIAGNOSIS — Z7982 Long term (current) use of aspirin: Secondary | ICD-10-CM | POA: Diagnosis not present

## 2016-10-23 DIAGNOSIS — Z9181 History of falling: Secondary | ICD-10-CM | POA: Diagnosis not present

## 2016-10-23 DIAGNOSIS — J441 Chronic obstructive pulmonary disease with (acute) exacerbation: Secondary | ICD-10-CM | POA: Diagnosis not present

## 2016-10-23 DIAGNOSIS — E860 Dehydration: Secondary | ICD-10-CM | POA: Diagnosis not present

## 2016-10-23 DIAGNOSIS — J9621 Acute and chronic respiratory failure with hypoxia: Secondary | ICD-10-CM | POA: Diagnosis not present

## 2016-10-23 DIAGNOSIS — R401 Stupor: Secondary | ICD-10-CM | POA: Diagnosis not present

## 2016-10-23 DIAGNOSIS — N184 Chronic kidney disease, stage 4 (severe): Secondary | ICD-10-CM | POA: Diagnosis not present

## 2016-10-23 DIAGNOSIS — I482 Chronic atrial fibrillation: Secondary | ICD-10-CM | POA: Diagnosis not present

## 2016-10-23 DIAGNOSIS — E872 Acidosis: Secondary | ICD-10-CM | POA: Diagnosis not present

## 2016-10-23 DIAGNOSIS — I5042 Chronic combined systolic (congestive) and diastolic (congestive) heart failure: Secondary | ICD-10-CM | POA: Diagnosis not present

## 2016-10-23 DIAGNOSIS — I480 Paroxysmal atrial fibrillation: Secondary | ICD-10-CM | POA: Diagnosis not present

## 2016-10-23 DIAGNOSIS — D649 Anemia, unspecified: Secondary | ICD-10-CM | POA: Diagnosis not present

## 2016-10-23 DIAGNOSIS — Z79899 Other long term (current) drug therapy: Secondary | ICD-10-CM | POA: Diagnosis not present

## 2016-10-23 DIAGNOSIS — R64 Cachexia: Secondary | ICD-10-CM | POA: Diagnosis not present

## 2016-10-23 DIAGNOSIS — J438 Other emphysema: Secondary | ICD-10-CM | POA: Diagnosis not present

## 2016-10-23 DIAGNOSIS — Z96641 Presence of right artificial hip joint: Secondary | ICD-10-CM | POA: Diagnosis not present

## 2016-10-23 DIAGNOSIS — G9341 Metabolic encephalopathy: Secondary | ICD-10-CM | POA: Diagnosis not present

## 2016-10-23 DIAGNOSIS — I10 Essential (primary) hypertension: Secondary | ICD-10-CM | POA: Diagnosis not present

## 2016-10-23 DIAGNOSIS — I129 Hypertensive chronic kidney disease with stage 1 through stage 4 chronic kidney disease, or unspecified chronic kidney disease: Secondary | ICD-10-CM | POA: Diagnosis not present

## 2016-10-23 DIAGNOSIS — J9612 Chronic respiratory failure with hypercapnia: Secondary | ICD-10-CM | POA: Diagnosis not present

## 2016-10-23 DIAGNOSIS — R404 Transient alteration of awareness: Secondary | ICD-10-CM | POA: Diagnosis not present

## 2016-10-23 DIAGNOSIS — J9610 Chronic respiratory failure, unspecified whether with hypoxia or hypercapnia: Secondary | ICD-10-CM | POA: Diagnosis not present

## 2016-10-23 DIAGNOSIS — M2550 Pain in unspecified joint: Secondary | ICD-10-CM | POA: Diagnosis not present

## 2016-10-23 DIAGNOSIS — A419 Sepsis, unspecified organism: Secondary | ICD-10-CM | POA: Diagnosis not present

## 2016-10-23 DIAGNOSIS — I5022 Chronic systolic (congestive) heart failure: Secondary | ICD-10-CM | POA: Diagnosis not present

## 2016-10-23 DIAGNOSIS — R2681 Unsteadiness on feet: Secondary | ICD-10-CM | POA: Diagnosis not present

## 2016-10-23 DIAGNOSIS — R627 Adult failure to thrive: Secondary | ICD-10-CM | POA: Diagnosis not present

## 2016-10-23 DIAGNOSIS — I739 Peripheral vascular disease, unspecified: Secondary | ICD-10-CM | POA: Diagnosis not present

## 2016-10-23 DIAGNOSIS — I2510 Atherosclerotic heart disease of native coronary artery without angina pectoris: Secondary | ICD-10-CM | POA: Diagnosis not present

## 2016-10-23 DIAGNOSIS — N390 Urinary tract infection, site not specified: Secondary | ICD-10-CM | POA: Diagnosis not present

## 2016-10-23 DIAGNOSIS — R918 Other nonspecific abnormal finding of lung field: Secondary | ICD-10-CM | POA: Diagnosis not present

## 2016-10-23 DIAGNOSIS — I130 Hypertensive heart and chronic kidney disease with heart failure and stage 1 through stage 4 chronic kidney disease, or unspecified chronic kidney disease: Secondary | ICD-10-CM | POA: Diagnosis not present

## 2016-10-23 DIAGNOSIS — R4182 Altered mental status, unspecified: Secondary | ICD-10-CM | POA: Diagnosis not present

## 2016-10-23 DIAGNOSIS — E440 Moderate protein-calorie malnutrition: Secondary | ICD-10-CM | POA: Diagnosis not present

## 2016-10-23 DIAGNOSIS — J449 Chronic obstructive pulmonary disease, unspecified: Secondary | ICD-10-CM | POA: Diagnosis not present

## 2016-10-23 DIAGNOSIS — I252 Old myocardial infarction: Secondary | ICD-10-CM | POA: Diagnosis not present

## 2016-10-23 DIAGNOSIS — N189 Chronic kidney disease, unspecified: Secondary | ICD-10-CM | POA: Diagnosis not present

## 2016-10-23 DIAGNOSIS — F70 Mild intellectual disabilities: Secondary | ICD-10-CM | POA: Diagnosis present

## 2016-10-23 DIAGNOSIS — E119 Type 2 diabetes mellitus without complications: Secondary | ICD-10-CM | POA: Diagnosis not present

## 2016-10-23 DIAGNOSIS — R6889 Other general symptoms and signs: Secondary | ICD-10-CM | POA: Diagnosis not present

## 2016-10-23 DIAGNOSIS — T50904A Poisoning by unspecified drugs, medicaments and biological substances, undetermined, initial encounter: Secondary | ICD-10-CM | POA: Diagnosis not present

## 2016-10-23 DIAGNOSIS — I481 Persistent atrial fibrillation: Secondary | ICD-10-CM | POA: Diagnosis not present

## 2016-10-23 DIAGNOSIS — N3000 Acute cystitis without hematuria: Secondary | ICD-10-CM | POA: Diagnosis not present

## 2016-10-23 DIAGNOSIS — E559 Vitamin D deficiency, unspecified: Secondary | ICD-10-CM | POA: Diagnosis not present

## 2016-10-23 DIAGNOSIS — N179 Acute kidney failure, unspecified: Secondary | ICD-10-CM | POA: Diagnosis not present

## 2016-10-25 DIAGNOSIS — R05 Cough: Secondary | ICD-10-CM | POA: Diagnosis not present

## 2016-10-25 DIAGNOSIS — I2510 Atherosclerotic heart disease of native coronary artery without angina pectoris: Secondary | ICD-10-CM | POA: Diagnosis not present

## 2016-10-25 DIAGNOSIS — E440 Moderate protein-calorie malnutrition: Secondary | ICD-10-CM | POA: Diagnosis not present

## 2016-10-29 DIAGNOSIS — E440 Moderate protein-calorie malnutrition: Secondary | ICD-10-CM | POA: Diagnosis not present

## 2016-10-29 DIAGNOSIS — I2510 Atherosclerotic heart disease of native coronary artery without angina pectoris: Secondary | ICD-10-CM | POA: Diagnosis not present

## 2016-10-29 DIAGNOSIS — I10 Essential (primary) hypertension: Secondary | ICD-10-CM | POA: Diagnosis not present

## 2016-11-07 ENCOUNTER — Emergency Department (HOSPITAL_COMMUNITY): Payer: Medicare Other

## 2016-11-07 ENCOUNTER — Encounter (HOSPITAL_COMMUNITY): Payer: Self-pay | Admitting: Emergency Medicine

## 2016-11-07 ENCOUNTER — Inpatient Hospital Stay (HOSPITAL_COMMUNITY)
Admission: EM | Admit: 2016-11-07 | Discharge: 2016-11-10 | DRG: 190 | Disposition: A | Discharge status: Skilled Nursing Facility | Payer: Medicare Other | Attending: Pulmonary Disease | Admitting: Pulmonary Disease

## 2016-11-07 DIAGNOSIS — I739 Peripheral vascular disease, unspecified: Secondary | ICD-10-CM | POA: Diagnosis not present

## 2016-11-07 DIAGNOSIS — E860 Dehydration: Secondary | ICD-10-CM | POA: Diagnosis present

## 2016-11-07 DIAGNOSIS — K219 Gastro-esophageal reflux disease without esophagitis: Secondary | ICD-10-CM | POA: Diagnosis present

## 2016-11-07 DIAGNOSIS — I2510 Atherosclerotic heart disease of native coronary artery without angina pectoris: Secondary | ICD-10-CM | POA: Diagnosis present

## 2016-11-07 DIAGNOSIS — N390 Urinary tract infection, site not specified: Secondary | ICD-10-CM | POA: Diagnosis not present

## 2016-11-07 DIAGNOSIS — E869 Volume depletion, unspecified: Secondary | ICD-10-CM | POA: Diagnosis present

## 2016-11-07 DIAGNOSIS — N189 Chronic kidney disease, unspecified: Secondary | ICD-10-CM | POA: Diagnosis not present

## 2016-11-07 DIAGNOSIS — J9610 Chronic respiratory failure, unspecified whether with hypoxia or hypercapnia: Secondary | ICD-10-CM | POA: Diagnosis not present

## 2016-11-07 DIAGNOSIS — N184 Chronic kidney disease, stage 4 (severe): Secondary | ICD-10-CM | POA: Diagnosis not present

## 2016-11-07 DIAGNOSIS — N179 Acute kidney failure, unspecified: Secondary | ICD-10-CM | POA: Diagnosis present

## 2016-11-07 DIAGNOSIS — N3000 Acute cystitis without hematuria: Secondary | ICD-10-CM | POA: Diagnosis not present

## 2016-11-07 DIAGNOSIS — R8281 Pyuria: Secondary | ICD-10-CM | POA: Diagnosis present

## 2016-11-07 DIAGNOSIS — R401 Stupor: Secondary | ICD-10-CM | POA: Diagnosis not present

## 2016-11-07 DIAGNOSIS — R404 Transient alteration of awareness: Secondary | ICD-10-CM | POA: Diagnosis not present

## 2016-11-07 DIAGNOSIS — I482 Chronic atrial fibrillation: Secondary | ICD-10-CM | POA: Diagnosis not present

## 2016-11-07 DIAGNOSIS — R2681 Unsteadiness on feet: Secondary | ICD-10-CM | POA: Diagnosis not present

## 2016-11-07 DIAGNOSIS — Z7982 Long term (current) use of aspirin: Secondary | ICD-10-CM | POA: Diagnosis not present

## 2016-11-07 DIAGNOSIS — A419 Sepsis, unspecified organism: Secondary | ICD-10-CM | POA: Diagnosis not present

## 2016-11-07 DIAGNOSIS — I252 Old myocardial infarction: Secondary | ICD-10-CM

## 2016-11-07 DIAGNOSIS — J9621 Acute and chronic respiratory failure with hypoxia: Secondary | ICD-10-CM | POA: Diagnosis not present

## 2016-11-07 DIAGNOSIS — I481 Persistent atrial fibrillation: Secondary | ICD-10-CM | POA: Diagnosis not present

## 2016-11-07 DIAGNOSIS — N183 Chronic kidney disease, stage 3 unspecified: Secondary | ICD-10-CM | POA: Diagnosis present

## 2016-11-07 DIAGNOSIS — R4182 Altered mental status, unspecified: Secondary | ICD-10-CM | POA: Diagnosis not present

## 2016-11-07 DIAGNOSIS — E872 Acidosis: Secondary | ICD-10-CM | POA: Diagnosis present

## 2016-11-07 DIAGNOSIS — I5043 Acute on chronic combined systolic (congestive) and diastolic (congestive) heart failure: Secondary | ICD-10-CM | POA: Diagnosis present

## 2016-11-07 DIAGNOSIS — I130 Hypertensive heart and chronic kidney disease with heart failure and stage 1 through stage 4 chronic kidney disease, or unspecified chronic kidney disease: Secondary | ICD-10-CM | POA: Diagnosis present

## 2016-11-07 DIAGNOSIS — Z79899 Other long term (current) drug therapy: Secondary | ICD-10-CM

## 2016-11-07 DIAGNOSIS — J438 Other emphysema: Secondary | ICD-10-CM

## 2016-11-07 DIAGNOSIS — I480 Paroxysmal atrial fibrillation: Secondary | ICD-10-CM | POA: Diagnosis present

## 2016-11-07 DIAGNOSIS — F17210 Nicotine dependence, cigarettes, uncomplicated: Secondary | ICD-10-CM | POA: Diagnosis present

## 2016-11-07 DIAGNOSIS — R918 Other nonspecific abnormal finding of lung field: Secondary | ICD-10-CM | POA: Diagnosis not present

## 2016-11-07 DIAGNOSIS — M6281 Muscle weakness (generalized): Secondary | ICD-10-CM | POA: Diagnosis not present

## 2016-11-07 DIAGNOSIS — Z96641 Presence of right artificial hip joint: Secondary | ICD-10-CM | POA: Diagnosis not present

## 2016-11-07 DIAGNOSIS — J441 Chronic obstructive pulmonary disease with (acute) exacerbation: Secondary | ICD-10-CM | POA: Diagnosis not present

## 2016-11-07 DIAGNOSIS — I129 Hypertensive chronic kidney disease with stage 1 through stage 4 chronic kidney disease, or unspecified chronic kidney disease: Secondary | ICD-10-CM | POA: Diagnosis not present

## 2016-11-07 DIAGNOSIS — G9341 Metabolic encephalopathy: Secondary | ICD-10-CM | POA: Diagnosis present

## 2016-11-07 DIAGNOSIS — R6510 Systemic inflammatory response syndrome (SIRS) of non-infectious origin without acute organ dysfunction: Secondary | ICD-10-CM | POA: Diagnosis present

## 2016-11-07 DIAGNOSIS — J449 Chronic obstructive pulmonary disease, unspecified: Secondary | ICD-10-CM | POA: Diagnosis not present

## 2016-11-07 DIAGNOSIS — I5042 Chronic combined systolic (congestive) and diastolic (congestive) heart failure: Secondary | ICD-10-CM | POA: Diagnosis present

## 2016-11-07 DIAGNOSIS — I10 Essential (primary) hypertension: Secondary | ICD-10-CM | POA: Diagnosis not present

## 2016-11-07 DIAGNOSIS — R64 Cachexia: Secondary | ICD-10-CM | POA: Diagnosis not present

## 2016-11-07 DIAGNOSIS — I5022 Chronic systolic (congestive) heart failure: Secondary | ICD-10-CM | POA: Diagnosis not present

## 2016-11-07 DIAGNOSIS — J9612 Chronic respiratory failure with hypercapnia: Secondary | ICD-10-CM | POA: Diagnosis not present

## 2016-11-07 DIAGNOSIS — Z681 Body mass index (BMI) 19 or less, adult: Secondary | ICD-10-CM

## 2016-11-07 DIAGNOSIS — T50904A Poisoning by unspecified drugs, medicaments and biological substances, undetermined, initial encounter: Secondary | ICD-10-CM | POA: Diagnosis not present

## 2016-11-07 DIAGNOSIS — F70 Mild intellectual disabilities: Secondary | ICD-10-CM | POA: Diagnosis present

## 2016-11-07 DIAGNOSIS — R627 Adult failure to thrive: Secondary | ICD-10-CM | POA: Diagnosis not present

## 2016-11-07 DIAGNOSIS — J9622 Acute and chronic respiratory failure with hypercapnia: Secondary | ICD-10-CM | POA: Diagnosis present

## 2016-11-07 DIAGNOSIS — E8729 Other acidosis: Secondary | ICD-10-CM

## 2016-11-07 DIAGNOSIS — Z8249 Family history of ischemic heart disease and other diseases of the circulatory system: Secondary | ICD-10-CM

## 2016-11-07 DIAGNOSIS — Z9181 History of falling: Secondary | ICD-10-CM | POA: Diagnosis not present

## 2016-11-07 LAB — URINALYSIS, ROUTINE W REFLEX MICROSCOPIC
BILIRUBIN URINE: NEGATIVE
GLUCOSE, UA: NEGATIVE mg/dL
Ketones, ur: NEGATIVE mg/dL
Nitrite: NEGATIVE
PROTEIN: NEGATIVE mg/dL
SPECIFIC GRAVITY, URINE: 1.006 (ref 1.005–1.030)
Squamous Epithelial / LPF: NONE SEEN
pH: 5 (ref 5.0–8.0)

## 2016-11-07 LAB — COMPREHENSIVE METABOLIC PANEL
ALK PHOS: 105 U/L (ref 38–126)
ALT: 15 U/L (ref 14–54)
ANION GAP: 9 (ref 5–15)
AST: 23 U/L (ref 15–41)
Albumin: 4.2 g/dL (ref 3.5–5.0)
BUN: 26 mg/dL — ABNORMAL HIGH (ref 6–20)
CALCIUM: 9.8 mg/dL (ref 8.9–10.3)
CO2: 26 mmol/L (ref 22–32)
CREATININE: 1.4 mg/dL — AB (ref 0.44–1.00)
Chloride: 108 mmol/L (ref 101–111)
GFR, EST AFRICAN AMERICAN: 43 mL/min — AB (ref >60)
GFR, EST NON AFRICAN AMERICAN: 37 mL/min — AB (ref >60)
Glucose, Bld: 103 mg/dL — ABNORMAL HIGH (ref 65–99)
Potassium: 5.5 mmol/L — ABNORMAL HIGH (ref 3.5–5.1)
Sodium: 143 mmol/L (ref 135–145)
Total Bilirubin: 0.5 mg/dL (ref 0.3–1.2)
Total Protein: 7.2 g/dL (ref 6.5–8.1)

## 2016-11-07 LAB — APTT: aPTT: 32 seconds (ref 24–36)

## 2016-11-07 LAB — DIFFERENTIAL
Basophils Absolute: 0 10*3/uL (ref 0.0–0.1)
Basophils Relative: 1 %
EOS PCT: 4 %
Eosinophils Absolute: 0.3 10*3/uL (ref 0.0–0.7)
LYMPHS ABS: 2.1 10*3/uL (ref 0.7–4.0)
LYMPHS PCT: 25 %
MONO ABS: 0.6 10*3/uL (ref 0.1–1.0)
MONOS PCT: 7 %
NEUTROS ABS: 5.6 10*3/uL (ref 1.7–7.7)
Neutrophils Relative %: 63 %

## 2016-11-07 LAB — CBC
HEMATOCRIT: 40.5 % (ref 36.0–46.0)
HEMOGLOBIN: 12.9 g/dL (ref 12.0–15.0)
MCH: 30.5 pg (ref 26.0–34.0)
MCHC: 31.9 g/dL (ref 30.0–36.0)
MCV: 95.7 fL (ref 78.0–100.0)
PLATELETS: 236 10*3/uL (ref 150–400)
RBC: 4.23 MIL/uL (ref 3.87–5.11)
RDW: 14.7 % (ref 11.5–15.5)
WBC: 8.7 10*3/uL (ref 4.0–10.5)

## 2016-11-07 LAB — I-STAT TROPONIN, ED: Troponin i, poc: 0.01 ng/mL (ref 0.00–0.08)

## 2016-11-07 LAB — BLOOD GAS, ARTERIAL
Acid-base deficit: 3.2 mmol/L — ABNORMAL HIGH (ref 0.0–2.0)
Bicarbonate: 21.1 mmol/L (ref 20.0–28.0)
O2 Content: 4 L/min
O2 Saturation: 99.1 %
PATIENT TEMPERATURE: 37
PO2 ART: 286 mmHg — AB (ref 83.0–108.0)
pCO2 arterial: 52.7 mmHg — ABNORMAL HIGH (ref 32.0–48.0)
pH, Arterial: 7.26 — ABNORMAL LOW (ref 7.350–7.450)

## 2016-11-07 LAB — RAPID URINE DRUG SCREEN, HOSP PERFORMED
AMPHETAMINES: NOT DETECTED
Barbiturates: NOT DETECTED
Benzodiazepines: NOT DETECTED
Cocaine: NOT DETECTED
OPIATES: NOT DETECTED
Tetrahydrocannabinol: NOT DETECTED

## 2016-11-07 LAB — I-STAT CG4 LACTIC ACID, ED
LACTIC ACID, VENOUS: 1.12 mmol/L (ref 0.5–1.9)
Lactic Acid, Venous: 1.38 mmol/L (ref 0.5–1.9)

## 2016-11-07 LAB — ETHANOL: Alcohol, Ethyl (B): <5 mg/dL (ref <5)

## 2016-11-07 LAB — AMMONIA: Ammonia: 16 umol/L (ref 9–35)

## 2016-11-07 LAB — CBG MONITORING, ED: Glucose-Capillary: 91 mg/dL (ref 65–99)

## 2016-11-07 LAB — ACETAMINOPHEN LEVEL: Acetaminophen (Tylenol), Serum: <10 ug/mL — ABNORMAL LOW (ref 10–30)

## 2016-11-07 LAB — PROTIME-INR
INR: 0.99
PROTHROMBIN TIME: 13.1 s (ref 11.4–15.2)

## 2016-11-07 LAB — SALICYLATE LEVEL

## 2016-11-07 MED ORDER — SODIUM CHLORIDE 0.9 % IV BOLUS (SEPSIS)
1000.0000 mL | Freq: Once | INTRAVENOUS | Status: AC
  Administered 2016-11-07: 1000 mL via INTRAVENOUS

## 2016-11-07 MED ORDER — ENOXAPARIN SODIUM 30 MG/0.3ML ~~LOC~~ SOLN
30.0000 mg | SUBCUTANEOUS | Status: DC
  Administered 2016-11-08 – 2016-11-09 (×3): 30 mg via SUBCUTANEOUS
  Filled 2016-11-07 (×3): qty 0.3

## 2016-11-07 MED ORDER — SODIUM CHLORIDE 0.9 % IV BOLUS (SEPSIS)
250.0000 mL | Freq: Once | INTRAVENOUS | Status: AC
  Administered 2016-11-07: 250 mL via INTRAVENOUS

## 2016-11-07 MED ORDER — SERTRALINE HCL 50 MG PO TABS
50.0000 mg | ORAL_TABLET | Freq: Every day | ORAL | Status: DC
  Administered 2016-11-08 – 2016-11-10 (×3): 50 mg via ORAL
  Filled 2016-11-07 (×3): qty 1

## 2016-11-07 MED ORDER — NALOXONE HCL 0.4 MG/ML IJ SOLN
0.4000 mg | Freq: Once | INTRAMUSCULAR | Status: AC
  Administered 2016-11-07: 0.4 mg via INTRAVENOUS
  Filled 2016-11-07: qty 1

## 2016-11-07 MED ORDER — ALBUTEROL SULFATE (2.5 MG/3ML) 0.083% IN NEBU
INHALATION_SOLUTION | RESPIRATORY_TRACT | Status: AC
  Filled 2016-11-07: qty 3

## 2016-11-07 MED ORDER — IPRATROPIUM-ALBUTEROL 0.5-2.5 (3) MG/3ML IN SOLN
3.0000 mL | Freq: Four times a day (QID) | RESPIRATORY_TRACT | Status: DC
  Administered 2016-11-07 – 2016-11-10 (×10): 3 mL via RESPIRATORY_TRACT
  Filled 2016-11-07 (×10): qty 3

## 2016-11-07 MED ORDER — SODIUM CHLORIDE 0.9 % IV SOLN
Freq: Once | INTRAVENOUS | Status: AC
  Administered 2016-11-07: 19:00:00 via INTRAVENOUS

## 2016-11-07 MED ORDER — CEFEPIME HCL 1 G IJ SOLR
1.0000 g | INTRAMUSCULAR | Status: DC
  Administered 2016-11-08 – 2016-11-09 (×2): 1 g via INTRAVENOUS
  Filled 2016-11-07 (×3): qty 1

## 2016-11-07 MED ORDER — DEXTROSE-NACL 5-0.2 % IV SOLN
INTRAVENOUS | Status: DC
  Administered 2016-11-07 – 2016-11-08 (×2): via INTRAVENOUS
  Administered 2016-11-08: 1 mL via INTRAVENOUS
  Administered 2016-11-09: 11:00:00 via INTRAVENOUS
  Filled 2016-11-07: qty 1000

## 2016-11-07 MED ORDER — DEXTROSE 5 % IV SOLN
2.0000 g | Freq: Once | INTRAVENOUS | Status: AC
  Administered 2016-11-07: 2 g via INTRAVENOUS
  Filled 2016-11-07: qty 2

## 2016-11-07 MED ORDER — ALBUTEROL SULFATE (2.5 MG/3ML) 0.083% IN NEBU
2.5000 mg | INHALATION_SOLUTION | RESPIRATORY_TRACT | Status: DC | PRN
  Administered 2016-11-07 – 2016-11-10 (×2): 2.5 mg via RESPIRATORY_TRACT
  Filled 2016-11-07: qty 3

## 2016-11-07 MED ORDER — SODIUM CHLORIDE 0.9 % IV BOLUS (SEPSIS)
1000.0000 mL | Freq: Once | INTRAVENOUS | Status: AC
  Administered 2016-11-08: 1000 mL via INTRAVENOUS

## 2016-11-07 MED ORDER — ASPIRIN EC 81 MG PO TBEC
81.0000 mg | DELAYED_RELEASE_TABLET | Freq: Every day | ORAL | Status: DC
  Administered 2016-11-08 – 2016-11-10 (×3): 81 mg via ORAL
  Filled 2016-11-07 (×3): qty 1

## 2016-11-07 NOTE — ED Notes (Signed)
Lorriane Shire, RT at bedside for art stick.

## 2016-11-07 NOTE — ED Notes (Signed)
Pt switched from NRB to 4L 02 [place]. Holding sats at 100%.

## 2016-11-07 NOTE — ED Notes (Signed)
CRITICAL VALUE ALERT  Critical Value:  ABG  PH 7.260, Pc02 52.7, p02 286, bicarb 21, s02 99%   Date & Time Notied:  11/07/16  Provider Notified: Dr. Tomi Bamberger  Orders Received/Actions taken: none

## 2016-11-07 NOTE — ED Triage Notes (Signed)
Pt from [facility].  EMS was called for slurred speech and AMS.  EMS states pt was unresponsive.  Pt showed sign of responsiveness with movement.  Pills at bedside from [facility].  Unaware if pt took them.  Pills are ASA. Potassium, and sumtriptan,.

## 2016-11-07 NOTE — Progress Notes (Signed)
Patient taken off BIPAP and placed on 2LNC with 02 saturations at 99%. Patient has some faint expiratory wheezes, albuterol breathing treatment given. Rhonchi also heard which cleared when patient coughed. RT will continue to monitor. BIPAP is on standby at bedside.

## 2016-11-07 NOTE — H&P (Signed)
[facility] History and Physical  Akiva Josey [id number] DOB: April 14, 1946 DOA: 11/07/2016  Referring physician: Dr Marye Round PCP: Sinda Du, MD   Chief Complaint: Altered MS  HPI: Alexis Dillon is a 71 y.o. female with hx of COPD, severe CAD on medical rx per cards, afib sp DCCV in 2014, R THR, severe LV and RV dysfunction/ combined chron CHF presenting to ED from SNF for altered mental status and slurred speech.  Per EMS pt was unresponsive. In ED pt showed some signs of responsivement w/ movement of arms/ legs in response to positioning by staff.  Pills at SNF at bedside were ASA, KCl and sumatriptan.  Not aware if she took any extra pills.  In ED bp's soft, improved with IVF saline bolus, also got IV abx (cefipime). Head CT negative, CXR hyperinflated, no infiltrates. Pt still poorly responsive. ABG showed CO2 retention and moderate resp acidosis. Asked to see for admission.  Pt has MOST for citing OK for CPR/ shock but no to intubation and mech ventilation.   Pt doesn't provide any history.  Labs as below.     Chart Jul 08 - severe OA R hip > underwent R THR Dec 14 - acute cor syndrome, HTN/ COPD/ CHF/ tobacco/ mild mental retard/ CKD 3, AKI, ^LFT's, afib, ischemic CM > CP, rx with IV hep / NTG, heart cath showed multivessel CAD w chronically occluded OM and RCA, diffuse LAD stenosis, cardiol rec's medical Rx due to comorbidities. Rx asa/ statin. Peak trop 1.4.  Afib new onset > TEE DCCV done, maintained NSR, started on Apixaban and amiodarone. CHADS 3.  LFT's from congestion, improved, Creat 1.2- 1.7.  HTN.  Systolic CM with EF 57-26% by ECHO with severe L/ R ventricular failure. No edema, no need for lasix now.  BP's soft. COPD flare rx with steroids, nebs.   Nov 15 - SOB > Hb 6, acute CHF rx with IV lasix and prbc's.  Dc to rehab in SNF.  Feb 16 - severe chest pain , inf- lat ST depression by EKG but trop neg.  Medical Rx, not candidate for aggressive intervention per cards.   CKD creat 1.5- 2.0. SP rat bite, but rabies injections x 5 during stay, adequate course per ID.   May 16 - SOB > COPD exac with acute resp failure and acute/ chron combined CHF.  Treated with bipap/ diuresis/ steroids/ nebs and improved.   ROS  n/a   Past Medical History  Past Medical History:  Diagnosis Date  . Anemia   . Arthritis   . Atrophy of right kidney   . Barrett esophagus   . CAD (coronary artery disease) 04/2013   a. cath 04/2013 showed RCA, LCx occlusion, 50% mid-LAD stenosis, 80% distal stenosis, and first diagonal branch w/ 75% ostial stenosis. previously been evaluated for CABG, however d/t renal insufficiency and mild MR, the decision was previously made to manage her medically.  . Chronic respiratory failure ([place])   . Chronic systolic CHF (congestive heart failure) ([place])   . CKD (chronic kidney disease), stage III   . COPD (chronic obstructive pulmonary disease) ([place])   . Diverticulosis   . GERD (gastroesophageal reflux disease)   . GI bleed    a.  GIB/symptomatic anemia Hgb 6 in 03/2014 (colonoscopy - small bowel AVMs/erosions).  . Hiatal hernia   . History of atrial septal defect    Closure in 50's   . Hyperkalemia   . Hypertension   . Ischemic cardiomyopathy  a. 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  . Mental retardation, idiopathic mild   . NSTEMI (non-ST elevated myocardial infarction) ([place])   . PAF (paroxysmal atrial fibrillation) ([place]) 04/2013   a. s/p prior TEE/DCCV. b. Eliquis stopped after GIB/anemia in 03/2014.  Marland Kitchen Rat bite    a. Tx with rabies vaccines.  . Tobacco abuse    Past Surgical History  Past Surgical History:  Procedure Laterality Date  . AGILE CAPSULE N/A 04/05/2014   Procedure: AGILE CAPSULE;  Surgeon: Danie Binder, MD;  [facility] ENDO [facility];  Service: Endoscopy;  Laterality: N/A;  . ASD REPAIR     in her 49s  . CARDIAC CATHETERIZATION  04/2013   Left mainstem: LM calcified with long 30% stenosis.  Left anterior descending (LAD):  The LAD is large and wraps the apex.  The mid vessel has 50% stenosis followed by a focal 80% stenosis.  D1 small with ostial 75% stenosis.Left anterior descending (LAD):  The LAD is large and wraps the apex.  The mid vessel has 50% stenosis followed by a focal 80% stenosis.  D1 small with ostial 75% stenosis.     Marland Kitchen CARDIOVERSION N/A 05/08/2013   Procedure: CARDIOVERSION;  Surgeon: Lelon Perla, MD;  Location: [facility] ENDOSCOPY;  Service: Cardiovascular;  Laterality: N/A;  Dr. Zoila Shutter verified with EP pt in a flutter...will cardiovert...  150 joules @ 4:40, using Propofol 40  mg/IV .Marland Kitchen.successful NSR   . COLONOSCOPY N/A 10/24/2013   Dr. Gala Romney: 3 polyps, one removed piecemeal (tubulovillous adenoma). Next TCS 04/2014  . COLONOSCOPY N/A 04/10/2014   Procedure: COLONOSCOPY;  Surgeon: Daneil Dolin, MD;  Location: [facility] ENDO [facility];  Service: Endoscopy;  Laterality: N/A;  . ESOPHAGOGASTRODUODENOSCOPY N/A 10/24/2013   Dr. Rourk:Barrett's without dysplasia. Next EGD 10/2014  . GIVENS CAPSULE STUDY N/A 04/17/2014   Procedure: GIVENS CAPSULE STUDY;  Surgeon: Daneil Dolin, MD;  Location: [facility] ENDO [facility];  Service: Endoscopy;  Laterality: N/A;  . LEFT AND RIGHT HEART CATHETERIZATION WITH CORONARY ANGIOGRAM N/A 05/04/2013   Procedure: LEFT AND RIGHT HEART CATHETERIZATION WITH CORONARY ANGIOGRAM;  Surgeon: Minus Breeding, MD;  Location: [facility] CATH LAB;  Service: Cardiovascular;  Laterality: N/A;  . TEE WITHOUT CARDIOVERSION N/A 05/08/2013   Procedure: TRANSESOPHAGEAL ECHOCARDIOGRAM (TEE);  Surgeon: Lelon Perla, MD;  Location: [facility] ENDOSCOPY;  Service: Cardiovascular;  Laterality: N/A;  . TONSILLECTOMY    . TOTAL ABDOMINAL HYSTERECTOMY     in the 32s  . TOTAL HIP ARTHROPLASTY Right    in her 68s   Family History  Family History  Problem Relation Age of Onset  . Heart attack Brother   . Heart attack Brother   . Diabetes Mother   . Heart attack Father        Died at 4 from MI   . Heart attack Sister        stent at 64  . Heart attack Sister        stent at 70  . Cancer Brother        lung  . Cancer Brother        lung   . Colon cancer Brother        deceased, diagnosed in early 75s   Social History  reports that she has been smoking Cigarettes.  She started smoking about 58 years ago. She has a 55.00 pack-year smoking history. She has never used smokeless tobacco. She reports that she does not drink alcohol  or use drugs. Allergies No Known Allergies Home medications Prior to Admission medications   Medication Sig Start Date End Date Taking? Authorizing Provider  acetaminophen (TYLENOL) 325 MG tablet Take 2 tablets (650 mg total) by mouth every 6 (six) hours as needed for mild pain or fever. 05/11/13  Yes Hongalgi, Lenis Dickinson, MD  amLODipine (NORVASC) 2.5 MG tablet Take 1 tablet (2.5 mg total) by mouth daily. 07/18/14  Yes Dunn, Nedra Hai, PA-C  aspirin EC 81 MG tablet Take 1 tablet (81 mg total) by mouth daily. 07/18/14  Yes Dunn, Dayna N, PA-C  atorvastatin (LIPITOR) 10 MG tablet Take 10 mg by mouth at bedtime.    Yes [provider]  guaiFENesin (MUCINEX) 600 MG 12 hr tablet Take 600 mg by mouth 2 (two) times daily.   Yes [provider]  ipratropium-albuterol (DUONEB) 0.5-2.5 (3) MG/3ML SOLN Take 3 mLs by nebulization 4 (four) times daily.    Yes [provider]  potassium chloride (K-DUR) 10 MEQ tablet Take 1 tablet (10 mEq total) by mouth 2 (two) times daily. Patient taking differently: Take 10 mEq by mouth daily.  09/26/14  Yes Sinda Du, MD  sertraline (ZOLOFT) 50 MG tablet Take 50 mg by mouth daily.  10/17/14  Yes [provider]  Vitamin D, Ergocalciferol, (DRISDOL) 50000 units CAPS capsule Take 50,000 Units by mouth every Thursday.   Yes [provider]   Liver Function Tests  Recent Labs Lab 11/07/16 1400  AST 23  ALT 15  ALKPHOS 105  BILITOT 0.5  PROT 7.2  ALBUMIN 4.2   No results for input(s):  LIPASE, AMYLASE in the last 168 hours. CBC  Recent Labs Lab 11/07/16 1400  WBC 8.7  NEUTROABS 5.6  HGB 12.9  HCT 40.5  MCV 95.7  PLT 662   Basic Metabolic Panel  Recent Labs Lab 11/07/16 1400  NA 143  K 5.5*  CL 108  CO2 26  GLUCOSE 103*  BUN 26*  CREATININE 1.40*  CALCIUM 9.8     Vitals:   11/07/16 1445 11/07/16 1449 11/07/16 1459 11/07/16 1500  BP:  114/66  (!) 92/57  Pulse:  (!) 102 72 71  Resp:  _0 Temp:      TempSrc:      SpO2: 100% 99% 100% 100%  Weight:       Exam: Gen elderly very frail and emaciated, obtunded, grips sometimes to command, eyes barely open Slow respirations  No rash, cyanosis or gangrene Sclera anicteric, throat dry  No jvd or bruits Chest poor cooperation, not able to hear a lot of air movement but no wheezing and no ^WOB RRR no MRG Abd soft ntnd no mass or ascites +bs GU w foley draining clear urine MS no joint effusions or deformity, marked musc wasting Ext no LE edema / no back or LE wounds or ulcers Neuro as above, moves all extremities, very weak   ABG 7.26/ 53/ 286 Na 143  K 5.5  CO2 26  BUN 26  Cr 1.40  Alb 4.2  LFT's ok  eGFR 40 Lactic 1.38  Trop 0.01   WBC 8k  Hb 12 Salicylate/ Tylenol wnl CT head - negative CT CXR (independ reviewed) > hyperinflated , no edema/ infiltrates  Meds at SNF > morvasc, asa, lipitor, Duoneb, KDur, zoloft, vit D   Assess: 1  Altered mental status - UA suggesting UTI, also CO2 retaining on ABG prob acute on chronic.  Also dehydrated. Not sure if  took overdose on medication but no good evidence that she did.  Plan admit, SDU, partial code (yes CPR/ shock, no to intuation/ mech ventilation), bipap prn but no intubation, give IV abx and cautious IVF's given hx of severe LV/ RV dysfunction.   2  Combined severe CHF - LVEF 20-25% , severe RVF 3  COPD - underlying, no wheezing on exam, moving air ok 4  Pyuria - r/o UTI, UCx sent 5  FTT - marked cachexia 6  Limited code 7  CKD 3 - creat  near baseline 1.5 8  Resp failure - CO2 retention, acute and/or chronic 9  Dehydration - sig with hemoconcentration ( alb >4, Hb > 12) 10  HTN - hold norvasc   Plan - as above     [place] D [facility] Pager [phone]   If 7PM-7AM, please contact night-coverage [email or web address] Password [facility] 11/07/2016, 4:30 PM

## 2016-11-07 NOTE — ED Notes (Signed)
No significant response from narcan. MD notified.

## 2016-11-07 NOTE — ED Notes (Signed)
Megan, phlebotomy at bedside obtaining blood cultures.

## 2016-11-07 NOTE — Progress Notes (Signed)
Pharmacy Antibiotic Note  Jowana Thumma is a 71 y.o. female admitted on 11/07/2016 with sepsis.  Pharmacy has been consulted for cefepime dosing. Cefepime 2gm ordered in the ED  Plan: Cont cefepime 1gm IV q24 hours F/u renal function, cultures and clinical course  Weight: 90 lb (40.8 kg)  Temp (24hrs), Avg:97.8 F (36.6 C), Min:97.8 F (36.6 C), Max:97.8 F (36.6 C)   Recent Labs Lab 11/07/16 1400 11/07/16 1439  WBC 8.7  --   CREATININE 1.40*  --   LATICACIDVEN  --  1.38    CrCl cannot be calculated (Unknown ideal weight.).    No Known Allergies   Thank you for allowing pharmacy to be a part of this patient's care.  Beverlee Nims 11/07/2016 2:53 PM

## 2016-11-07 NOTE — ED Notes (Signed)
Pt taken to CT.

## 2016-11-07 NOTE — ED Notes (Signed)
EKG given to [facility] MD by Jeannine Boga

## 2016-11-07 NOTE — ED Provider Notes (Addendum)
[place] DEPT Provider Note   CSN: [id number] Arrival date & time: 11/07/16  1355     History   Chief Complaint Chief Complaint  Patient presents with  . Altered Mental Status    HPI Alexis Dillon is a 71 y.o. female.  HPI Patient presents to the emergency room from a nursing home with altered mental status. According to the nursing home report, the patient was initially seen this morning and given her medications. She seemed to be in her usual state of health. Later on this morning when they went to check on her again patient seemed to be somewhat altered and she had some slurred speech. EMS was contacted. When EMS arrived they noted the patient was not speaking or communicating. She was responding to physical stimuli. I found bags of pills at the bedside. Pills were aspirin potassium and sumatriptan. It is unclear why the patient had bags of medications at her nursing facility.  Patient is unable to provide any history here in the emergency room. Past Medical History:  Diagnosis Date  . Anemia   . Arthritis   . Atrophy of right kidney   . Barrett esophagus   . CAD (coronary artery disease) 04/2013   a. cath 04/2013 showed RCA, LCx occlusion, 50% mid-LAD stenosis, 80% distal stenosis, and first diagonal branch w/ 75% ostial stenosis. previously been evaluated for CABG, however d/t renal insufficiency and mild MR, the decision was previously made to manage her medically.  . Chronic respiratory failure ([place])   . Chronic systolic CHF (congestive heart failure) ([place])   . CKD (chronic kidney disease), stage III   . COPD (chronic obstructive pulmonary disease) ([place])   . Diverticulosis   . GERD (gastroesophageal reflux disease)   . GI bleed    a.  GIB/symptomatic anemia Hgb 6 in 03/2014 (colonoscopy - small bowel AVMs/erosions).  . Hiatal hernia   . History of atrial septal defect    Closure in 50's   . Hyperkalemia   . Hypertension   . Ischemic cardiomyopathy    a. echo   07/15/14 with EF 30-35%, +WMA, grade 2 DD, mild-mod MR.  . Mental retardation, idiopathic mild   . NSTEMI (non-ST elevated myocardial infarction) ([place])   . PAF (paroxysmal atrial fibrillation) ([place]) 04/2013   a. s/p prior TEE/DCCV. b. Eliquis stopped after GIB/anemia in 03/2014.  Marland Kitchen Rat bite    a. Tx with rabies vaccines.  . Tobacco abuse       Past Surgical History:  Procedure Laterality Date  . AGILE CAPSULE N/A 04/05/2014   Procedure: AGILE CAPSULE;  Surgeon: Danie Binder, MD;  [facility];  Service: Endoscopy;  Laterality: N/A;  . ASD REPAIR     in her 67s  . CARDIAC CATHETERIZATION  04/2013   Left mainstem: LM calcified with long 30% stenosis. Left anterior descending (LAD):  The LAD is large and wraps the apex.  The mid vessel has 50% stenosis followed by a focal 80% stenosis.  D1 small with ostial 75% stenosis.Left anterior descending (LAD):  The LAD is large and wraps the apex.  The mid vessel has 50% stenosis followed by a focal 80% stenosis.  D1 small with ostial 75% stenosis.     Marland Kitchen CARDIOVERSION N/A 05/08/2013   Procedure: CARDIOVERSION;  Surgeon: Lelon Perla, MD;  Location: [facility] ENDOSCOPY;  Service: Cardiovascular;  Laterality: N/A;  Dr. Zoila Shutter verified with EP pt in a flutter...will cardiovert...  150 joules @ 4:40, using Propofol 40  mg/IV ...successful NSR   . COLONOSCOPY N/A 10/24/2013   Dr. Gala Romney: 3 polyps, one removed piecemeal (tubulovillous adenoma). Next TCS 04/2014  . COLONOSCOPY N/A 04/10/2014   Procedure: COLONOSCOPY;  Surgeon: Daneil Dolin, MD;  Location: [facility];  Service: Endoscopy;  Laterality: N/A;  . ESOPHAGOGASTRODUODENOSCOPY N/A 10/24/2013   Dr. Rourk:Barrett's without dysplasia. Next EGD 10/2014  . GIVENS CAPSULE STUDY N/A 04/17/2014   Procedure: GIVENS CAPSULE STUDY;  Surgeon: Daneil Dolin, MD;  Location: [facility];  Service: Endoscopy;  Laterality: N/A;  . LEFT AND RIGHT HEART CATHETERIZATION WITH CORONARY ANGIOGRAM N/A  05/04/2013   Procedure: LEFT AND RIGHT HEART CATHETERIZATION WITH CORONARY ANGIOGRAM;  Surgeon: Minus Breeding, MD;  Location[facility] CATH LAB;  Service: Cardiovascular;  Laterality: N/A;  . TEE WITHOUT CARDIOVERSION N/A 05/08/2013   Procedure: TRANSESOPHAGEAL ECHOCARDIOGRAM (TEE);  Surgeon: Lelon Perla, MD;  Location: [facility] ENDOSCOPY;  Service: Cardiovascular;  Laterality: N/A;  . TONSILLECTOMY    . TOTAL ABDOMINAL HYSTERECTOMY     in the 93s  . TOTAL HIP ARTHROPLASTY Right    in her 71s    OB History    No data available       Home Medications    Prior to Admission medications   Medication Sig Start Date End Date Taking? Authorizing Provider  acetaminophen (TYLENOL) 325 MG tablet Take 2 tablets (650 mg total) by mouth every 6 (six) hours as needed for mild pain or fever. 05/11/13  Yes Hongalgi, Lenis Dickinson, MD  amLODipine (NORVASC) 2.5 MG tablet Take 1 tablet (2.5 mg total) by mouth daily. 07/18/14  Yes Dunn, Nedra Hai, PA-C  aspirin EC 81 MG tablet Take 1 tablet (81 mg total) by mouth daily. 07/18/14  Yes Dunn, Dayna N, PA-C  atorvastatin (LIPITOR) 10 MG tablet Take 10 mg by mouth at bedtime.    Yes [provider]  guaiFENesin (MUCINEX) 600 MG 12 hr tablet Take 600 mg by mouth 2 (two) times daily.   Yes [provider]  ipratropium-albuterol (DUONEB) 0.5-2.5 (3) MG/3ML SOLN Take 3 mLs by nebulization 4 (four) times daily.    Yes [provider]  potassium chloride (K-DUR) 10 MEQ tablet Take 1 tablet (10 mEq total) by mouth 2 (two) times daily. Patient taking differently: Take 10 mEq by mouth daily.  09/26/14  Yes Sinda Du, MD  sertraline (ZOLOFT) 50 MG tablet Take 50 mg by mouth daily.  10/17/14  Yes [provider]  Vitamin D, Ergocalciferol, (DRISDOL) 50000 units CAPS capsule Take 50,000 Units by mouth every Thursday.   Yes [provider]    Family History Family History  Problem Relation Age of Onset  . Heart attack Brother   . Heart  attack Brother   . Diabetes Mother   . Heart attack Father        Died at 68 from MI  . Heart attack Sister        stent at 49  . Heart attack Sister        stent at 76  . Cancer Brother        lung  . Cancer Brother        lung   . Colon cancer Brother        deceased, diagnosed in early 56s    Social History Social History  Substance Use Topics  . Smoking status: Current Every Day Smoker    Packs/day: 1.00    Years: 55.00    Types: Cigarettes  Start date: 02/08/1958  . Smokeless tobacco: Never Used     Comment: stopped Dec 2014  . Alcohol use No     Allergies   Patient has no known allergies.   Review of Systems Review of Systems  Unable to perform ROS: Mental status change  All other systems reviewed and are negative.    Physical Exam Updated Vital Signs BP (!) 92/57   Pulse 71   Temp 97.8 F (36.6 C) (Rectal)   Resp 13   Wt 40.8 kg (90 lb)   SpO2 100%   BMI 16.46 kg/m   Physical Exam  Constitutional: No distress.  Frail, elderly  HENT:  Head: Normocephalic and atraumatic.  Right Ear: External ear normal.  Left Ear: External ear normal.  Mouth/Throat: No oropharyngeal exudate.  Eyes: Conjunctivae are normal. Pupils are equal, round, and reactive to light. Right eye exhibits no discharge. Left eye exhibits no discharge. No scleral icterus.  Neck: Neck supple. No tracheal deviation present.  Cardiovascular: Normal rate, regular rhythm and intact distal pulses.   Pulmonary/Chest: Effort normal and breath sounds normal. No stridor. No respiratory distress. She has no wheezes. She has no rales.  Abdominal: Soft. Bowel sounds are normal. She exhibits no distension. There is no tenderness. There is no rebound and no guarding.  Musculoskeletal: She exhibits no edema or tenderness.  Neurological: She is alert. No cranial nerve deficit (no facial droop, but exam is limited by the patient's medical status). She exhibits normal muscle tone. She displays no  seizure activity. Coordination normal. GCS eye subscore is 2. GCS verbal subscore is 2. GCS motor subscore is 5.  Patient withdraws to painful stimuli, all 4 extremities seemed to be moving, she is not answering questions, does not follow any commands when I ask her to  Skin: Skin is warm and dry. No rash noted. She is not diaphoretic.  Psychiatric: She has a normal mood and affect.  Nursing note and vitals reviewed.    ED Treatments / Results  Labs (all labs ordered are listed, but only abnormal results are displayed) Labs Reviewed  COMPREHENSIVE METABOLIC PANEL - Abnormal; Notable for the following:       Result Value   Potassium 5.5 (*)    Glucose, Bld 103 (*)    BUN 26 (*)    Creatinine, Ser 1.40 (*)    GFR calc non Af Amer 37 (*)    GFR calc Af Amer 43 (*)    All other components within normal limits  URINALYSIS, ROUTINE W REFLEX MICROSCOPIC - Abnormal; Notable for the following:    APPearance HAZY (*)    Hgb urine dipstick SMALL (*)    Leukocytes, UA LARGE (*)    Bacteria, UA FEW (*)    All other components within normal limits  ACETAMINOPHEN LEVEL - Abnormal; Notable for the following:    Acetaminophen (Tylenol), Serum <10 (*)    All other components within normal limits  BLOOD GAS, ARTERIAL - Abnormal; Notable for the following:    pH, Arterial 7.260 (*)    pCO2 arterial 52.7 (*)    pO2, Arterial 286 (*)    Acid-base deficit 3.2 (*)    Allens test (pass/fail) NOT INDICATED (*)    All other components within normal limits  CULTURE, BLOOD (ROUTINE X 2)  CULTURE, BLOOD (ROUTINE X 2)  URINE CULTURE  ETHANOL  PROTIME-INR  APTT  CBC  DIFFERENTIAL  RAPID URINE DRUG SCREEN, HOSP PERFORMED  SALICYLATE LEVEL  AMMONIA  I-STAT TROPOININ, ED  CBG MONITORING, ED  I-STAT CG4 LACTIC ACID, ED    EKG  EKG Interpretation  Date/Time:  Sunday November 07 2016 13:55:34 EDT Ventricular Rate:  89 PR Interval:    QRS Duration: 110 QT Interval:  381 QTC Calculation: 464 R  Axis:   94 Text Interpretation:  Sinus rhythm Right axis deviation nonspecific t wave changes since last tracing Confirmed by Dorie Rank [id number]) on 11/07/2016 2:03:26 PM       Radiology Ct Head Wo Contrast  Result Date: 11/07/2016 CLINICAL DATA:  Slurred speech and altered mental status, unresponsive. EXAM: CT HEAD WITHOUT CONTRAST TECHNIQUE: Contiguous axial images were obtained from the base of the skull through the vertex without intravenous contrast. COMPARISON:  None. FINDINGS: Brain: Generalized age related parenchymal atrophy with commensurate dilatation of the ventricles and sulci. No mass, hemorrhage, edema or other evidence of acute parenchymal abnormality. No extra-axial hemorrhage. Vascular: There are chronic calcified atherosclerotic changes of the large vessels at the skull base. No unexpected hyperdense vessel. Skull: Normal. Negative for fracture or focal lesion. Sinuses/Orbits: No acute finding. Other: None. IMPRESSION: Negative head CT.  No intracranial mass, hemorrhage or edema. Electronically Signed   By: Franki Cabot M.D.   On: 11/07/2016 15:36   Dg Chest Portable 1 View  Result Date: 11/07/2016 CLINICAL DATA:  CODE SEPSIS, ALTERED MENTAL STATUS, Pt from [facility]. EMS was called for slurred speech and AMS. EMS states pt was unresponsive. Pt showed sign of responsiveness with movement HISTORY OF CHF, HTN, COPD, ARTHRITIS, CAD, ATRIAL FIB, CKD, PAF EXAM: PORTABLE CHEST 1 VIEW COMPARISON:  Radiograph 1086 FINDINGS: Normal mediastinum and cardiac silhouette. Normal pulmonary vasculature. No evidence of effusion, infiltrate, or pneumothorax. No acute bony abnormality. Lungs are hyperinflated. IMPRESSION: Hyperinflated lungs.  No focal infiltrate. Electronically Signed   By: Suzy Bouchard M.D.   On: 11/07/2016 15:13    Procedures .Critical Care Performed by: Dorie Rank Authorized by: Dorie Rank   Critical care provider statement:    Critical care time (minutes):  71   Critical  care was time spent personally by me on the following activities:  Discussions with consultants, evaluation of patient's response to treatment, examination of patient, ordering and performing treatments and interventions, ordering and review of laboratory studies, ordering and review of radiographic studies, pulse oximetry, re-evaluation of patient's condition, obtaining history from patient or surrogate and review of old charts   (including critical care time)  Medications Ordered in ED Medications  naloxone (NARCAN) injection 0.4 mg (0.4 mg Intravenous Given 11/07/16 1409)  sodium chloride 0.9 % bolus 1,000 mL (0 mLs Intravenous Stopped 11/07/16 1523)    And  sodium chloride 0.9 % bolus 250 mL (0 mLs Intravenous Stopped 11/07/16 1550)  ceFEPIme (MAXIPIME) 2 g in dextrose 5 % 50 mL IVPB (0 g Intravenous Stopped 11/07/16 1543)     Initial Impression / Assessment and Plan / ED Course  I have reviewed the triage vital signs and the nursing notes.  Pertinent labs & imaging results that were available during my care of the patient were reviewed by me and considered in my medical decision making (see chart for details).  Clinical Course as of Nov 08 1607  Sun Nov 07, 2016  1400 Broad differential potentially causing her decline in mental status. No focal neurologic deficits to suggest acute stroke at this time but still a consideration.  Will continue to evaluate.  Do not feel she is a tpa candidate based on the uncertainty  of the onset and her clinical picture of primarily altered mental status.  [SP]  2330 Notified that pt's BP is low.  Will give a fluid bolus, check a lactic acid level.  UA does suggest a uti.  Will start on abx , cefipime per sepsis order set, to cover for sepsis.  Likely cause of her mental status changes at this point.    [JK]  1602 ABG appears to be a mixed picture primarily respiratory.  Will try bipap.  Continue to monitor mental status.  Appears to be oxygenating and  protecting airway at this time.  [JK]    Clinical Course User Index [JK] Dorie Rank, MD    Patient presented to the emergency room with altered mental status. Vital signs were notable for hypotension.  I suspect this is the cause for altered mental status and not related to an acute stroke.  The patient's laboratory tests are notable for a urinary tract infection. I suspect this is causing sepsis. ABGs also notable for decreased pH and elevated PCO2.  We will try adding on BiPAP to help her with her respiratory efforts. Plan on admission to the hospital for further treatment  Final Clinical Impressions(s) / ED Diagnoses   Final diagnoses:  Altered mental status, unspecified altered mental status type  Acute cystitis without hematuria  Sepsis, due to unspecified organism [facility])  Respiratory acidosis      Dorie Rank, MD 11/07/16 1610    Dorie Rank, MD 11/07/16 [phone]

## 2016-11-07 NOTE — ED Notes (Signed)
Lorriane Shire, RT notified of bipap order.

## 2016-11-08 LAB — MRSA PCR SCREENING: MRSA BY PCR: NEGATIVE

## 2016-11-08 LAB — BASIC METABOLIC PANEL
ANION GAP: 7 (ref 5–15)
BUN: 21 mg/dL — ABNORMAL HIGH (ref 6–20)
CALCIUM: 8.1 mg/dL — AB (ref 8.9–10.3)
CO2: 24 mmol/L (ref 22–32)
Chloride: 109 mmol/L (ref 101–111)
Creatinine, Ser: 1.22 mg/dL — ABNORMAL HIGH (ref 0.44–1.00)
GFR, EST AFRICAN AMERICAN: 50 mL/min — AB (ref >60)
GFR, EST NON AFRICAN AMERICAN: 43 mL/min — AB (ref >60)
Glucose, Bld: 94 mg/dL (ref 65–99)
POTASSIUM: 3.7 mmol/L (ref 3.5–5.1)
Sodium: 140 mmol/L (ref 135–145)

## 2016-11-08 LAB — CBC
HEMATOCRIT: 35 % — AB (ref 36.0–46.0)
HEMOGLOBIN: 10.7 g/dL — AB (ref 12.0–15.0)
MCH: 30.2 pg (ref 26.0–34.0)
MCHC: 30.6 g/dL (ref 30.0–36.0)
MCV: 98.9 fL (ref 78.0–100.0)
Platelets: 174 10*3/uL (ref 150–400)
RBC: 3.54 MIL/uL — AB (ref 3.87–5.11)
RDW: 14.6 % (ref 11.5–15.5)
WBC: 7.5 10*3/uL (ref 4.0–10.5)

## 2016-11-08 LAB — LACTIC ACID, PLASMA: LACTIC ACID, VENOUS: 0.9 mmol/L (ref 0.5–1.9)

## 2016-11-08 MED ORDER — SODIUM CHLORIDE 0.9 % IV BOLUS (SEPSIS)
500.0000 mL | Freq: Once | INTRAVENOUS | Status: AC
  Administered 2016-11-08: 500 mL via INTRAVENOUS

## 2016-11-08 MED ORDER — ACETAMINOPHEN 325 MG PO TABS
650.0000 mg | ORAL_TABLET | ORAL | Status: DC | PRN
  Administered 2016-11-08 – 2016-11-09 (×3): 650 mg via ORAL
  Filled 2016-11-08 (×3): qty 2

## 2016-11-08 NOTE — Progress Notes (Signed)
Subjective: She was admitted yesterday with altered mental status. 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 She's better this morning. She is more alert. Her blood gas on admission showed CO2 retention with respiratory acidosis. She's being treated for COPD exacerbation and acute on chronic hypoxic and hypercapnic respiratory failure. She is also being treated for dehydration and urinary tract infection  Objective: Vital signs in last 24 hours: Temp:  [95.9 F (35.5 C)-97.8 F (36.6 C)] 95.9 F (35.5 C) (06/24 1836) Pulse Rate:  [57-102] 76 (06/25 0700) Resp:  [9-20] 20 (06/25 0700) BP: (58-166)/(35-129) 58/35 (06/25 0500) SpO2:  [93 %-100 %] 98 % (06/25 0700) FiO2 (%):  [28 %-35 %] 28 % (06/25 0000) Weight:  [37.3 kg (82 lb 3.7 oz)-40.8 kg (90 lb)] 37.5 kg (82 lb 10.8 oz) (06/25 0500) Weight change:     Intake/Output from previous day: 06/24 0701 - 06/25 0700 In: 875 [I.V.:875] Out: 2350 [Urine:2350]  PHYSICAL EXAM General appearance: alert, mild distress and Still mildly confused Resp: rhonchi bilaterally Cardio: regular rate and rhythm, S1, S2 normal, no murmur, click, rub or gallop GI: soft, non-tender; bowel sounds normal; no masses,  no organomegaly Extremities: extremities normal, atraumatic, no cyanosis or edema Skin turgor still poor. Mucous membranes are dry  Lab Results:  Results for orders placed or performed during the hospital encounter of 11/07/16 (from the past 48 hour(s))  Urine rapid drug screen (hosp performed)not at [facility]     Status: None   Collection Time: 11/07/16  1:57 PM  Result Value Ref Range   Opiates NONE DETECTED NONE DETECTED   Cocaine NONE  DETECTED NONE DETECTED   Benzodiazepines NONE DETECTED NONE DETECTED   Amphetamines NONE DETECTED NONE DETECTED   Tetrahydrocannabinol NONE DETECTED NONE DETECTED   Barbiturates NONE DETECTED NONE DETECTED    Comment:        DRUG SCREEN FOR MEDICAL PURPOSES ONLY.  IF CONFIRMATION IS NEEDED FOR ANY PURPOSE, NOTIFY LAB WITHIN 5 DAYS.        LOWEST DETECTABLE LIMITS FOR URINE DRUG SCREEN Drug Class       Cutoff (ng/mL) Amphetamine      1000 Barbiturate      200 Benzodiazepine   540 Tricyclics       981 Opiates          300 Cocaine          300 THC              50   Urinalysis, Routine w reflex microscopic (not at [facility])     Status: Abnormal   Collection Time: 11/07/16  1:57 PM  Result Value Ref Range   Color, Urine YELLOW YELLOW   APPearance HAZY (A) CLEAR   Specific Gravity, Urine 1.006 1.005 - 1.030   pH 5.0 5.0 - 8.0   Glucose, UA NEGATIVE NEGATIVE mg/dL   Hgb urine dipstick SMALL (A) NEGATIVE   Bilirubin Urine NEGATIVE NEGATIVE   Ketones, ur NEGATIVE NEGATIVE mg/dL   Protein, ur NEGATIVE NEGATIVE mg/dL   Nitrite NEGATIVE NEGATIVE   Leukocytes, UA LARGE (A) NEGATIVE   RBC / HPF 0-5 0 - 5 RBC/hpf   WBC, UA TOO NUMEROUS TO COUNT 0 - 5 WBC/hpf   Bacteria,  UA FEW (A) NONE SEEN   Squamous Epithelial / LPF NONE SEEN NONE SEEN   WBC Clumps PRESENT   CBG monitoring, ED     Status: None   Collection Time: 11/07/16  1:58 PM  Result Value Ref Range   Glucose-Capillary 91 65 - 99 mg/dL  Protime-INR     Status: None   Collection Time: 11/07/16  2:00 PM  Result Value Ref Range   Prothrombin Time 13.1 11.4 - 15.2 seconds   INR 0.99   APTT     Status: None   Collection Time: 11/07/16  2:00 PM  Result Value Ref Range   aPTT 32 24 - 36 seconds  CBC     Status: None   Collection Time: 11/07/16  2:00 PM  Result Value Ref Range   WBC 8.7 4.0 - 10.5 K/uL   RBC 4.23 3.87 - 5.11 MIL/uL   Hemoglobin 12.9 12.0 - 15.0 g/dL   HCT 40.5 36.0 - 46.0 %   MCV 95.7 78.0 - 100.0 fL   MCH  30.5 26.0 - 34.0 pg   MCHC 31.9 30.0 - 36.0 g/dL   RDW 14.7 11.5 - 15.5 %   Platelets 236 150 - 400 K/uL  Differential     Status: None   Collection Time: 11/07/16  2:00 PM  Result Value Ref Range   Neutrophils Relative % 63 %   Neutro Abs 5.6 1.7 - 7.7 K/uL   Lymphocytes Relative 25 %   Lymphs Abs 2.1 0.7 - 4.0 K/uL   Monocytes Relative 7 %   Monocytes Absolute 0.6 0.1 - 1.0 K/uL   Eosinophils Relative 4 %   Eosinophils Absolute 0.3 0.0 - 0.7 K/uL   Basophils Relative 1 %   Basophils Absolute 0.0 0.0 - 0.1 K/uL  Comprehensive metabolic panel     Status: Abnormal   Collection Time: 11/07/16  2:00 PM  Result Value Ref Range   Sodium 143 135 - 145 mmol/L   Potassium 5.5 (H) 3.5 - 5.1 mmol/L   Chloride 108 101 - 111 mmol/L   CO2 26 22 - 32 mmol/L   Glucose, Bld 103 (H) 65 - 99 mg/dL   BUN 26 (H) 6 - 20 mg/dL   Creatinine, Ser 1.40 (H) 0.44 - 1.00 mg/dL   Calcium 9.8 8.9 - 10.3 mg/dL   Total Protein 7.2 6.5 - 8.1 g/dL   Albumin 4.2 3.5 - 5.0 g/dL   AST 23 15 - 41 U/L   ALT 15 14 - 54 U/L   Alkaline Phosphatase 105 38 - 126 U/L   Total Bilirubin 0.5 0.3 - 1.2 mg/dL   GFR calc non Af Amer 37 (L) >60 mL/min   GFR calc Af Amer 43 (L) >60 mL/min    Comment: (NOTE) The eGFR has been calculated using the CKD EPI equation. This calculation has not been validated in all clinical situations. eGFR's persistently <60 mL/min signify possible Chronic Kidney Disease.    Anion gap 9 5 - 15  Ammonia     Status: None   Collection Time: 11/07/16  2:01 PM  Result Value Ref Range   Ammonia 16 9 - 35 umol/L  Ethanol     Status: None   Collection Time: 11/07/16  2:02 PM  Result Value Ref Range   Alcohol, Ethyl (B) <5 <5 mg/dL    Comment:        LOWEST DETECTABLE LIMIT FOR SERUM ALCOHOL IS 5 mg/dL FOR MEDICAL PURPOSES ONLY   Acetaminophen  level     Status: Abnormal   Collection Time: 11/07/16  2:02 PM  Result Value Ref Range   Acetaminophen (Tylenol), Serum <10 (L) 10 - 30 ug/mL     Comment:        THERAPEUTIC CONCENTRATIONS VARY SIGNIFICANTLY. A RANGE OF 10-30 ug/mL MAY BE AN EFFECTIVE CONCENTRATION FOR MANY PATIENTS. HOWEVER, SOME ARE BEST TREATED AT CONCENTRATIONS OUTSIDE THIS RANGE. ACETAMINOPHEN CONCENTRATIONS >150 ug/mL AT 4 HOURS AFTER INGESTION AND >50 ug/mL AT 12 HOURS AFTER INGESTION ARE OFTEN ASSOCIATED WITH TOXIC REACTIONS.   Salicylate level     Status: None   Collection Time: 11/07/16  2:02 PM  Result Value Ref Range   Salicylate Lvl <8.6 2.8 - 30.0 mg/dL  I-stat troponin, ED (not at [facility])     Status: None   Collection Time: 11/07/16  2:06 PM  Result Value Ref Range   Troponin i, poc 0.01 0.00 - 0.08 ng/mL   Comment 3            Comment: Due to the release kinetics of cTnI, a negative result within the first hours of the onset of symptoms does not rule out myocardial infarction with certainty. If myocardial infarction is still suspected, repeat the test at appropriate intervals.   I-Stat CG4 Lactic Acid, ED  (not at  [facility])     Status: None   Collection Time: 11/07/16  2:39 PM  Result Value Ref Range   Lactic Acid, Venous 1.38 0.5 - 1.9 mmol/L  Blood culture (routine x 2)     Status: None (Preliminary result)   Collection Time: 11/07/16  3:02 PM  Result Value Ref Range   Specimen Description BLOOD RIGHT HAND    Special Requests Blood Culture adequate volume    Culture NO GROWTH < 12 HOURS    Report Status PENDING   Blood culture (routine x 2)     Status: None (Preliminary result)   Collection Time: 11/07/16  3:02 PM  Result Value Ref Range   Specimen Description BLOOD LEFT ANTECUBITAL    Special Requests Blood Culture adequate volume    Culture NO GROWTH < 12 HOURS    Report Status PENDING   Blood gas, arterial ([facility] & [facility] ONLY)     Status: Abnormal   Collection Time: 11/07/16  3:10 PM  Result Value Ref Range   O2 Content 4.0 L/min   Delivery systems NASAL CANNULA    pH, Arterial 7.260 (L) 7.350 - 7.450    Comment: RBV  M.MITCHELL,RN AT 1515 BY V.LAWSON,RRT ON 11/07/16   pCO2 arterial 52.7 (H) 32.0 - 48.0 mmHg    Comment: RBV M.MITCHELL,RN AT 1515 BY V.LAWSON,RRT ON 11/07/16   pO2, Arterial 286 (H) 83.0 - 108.0 mmHg   Bicarbonate 21.1 20.0 - 28.0 mmol/L   Acid-base deficit 3.2 (H) 0.0 - 2.0 mmol/L   O2 Saturation 99.1 %   Patient temperature 37.0    Collection site BRACHIAL ARTERY    Drawn by COLLECTED BY RT    Sample type ARTERIAL    Allens test (pass/fail) NOT INDICATED (A) PASS  I-Stat CG4 Lactic Acid, ED  (not at  [facility])     Status: None   Collection Time: 11/07/16  6:01 PM  Result Value Ref Range   Lactic Acid, Venous 1.12 0.5 - 1.9 mmol/L  Basic metabolic panel     Status: Abnormal   Collection Time: 11/08/16  4:48 AM  Result Value Ref Range   Sodium 140 135 - 145  mmol/L   Potassium 3.7 3.5 - 5.1 mmol/L    Comment: DELTA CHECK NOTED   Chloride 109 101 - 111 mmol/L   CO2 24 22 - 32 mmol/L   Glucose, Bld 94 65 - 99 mg/dL   BUN 21 (H) 6 - 20 mg/dL   Creatinine, Ser 1.22 (H) 0.44 - 1.00 mg/dL   Calcium 8.1 (L) 8.9 - 10.3 mg/dL   GFR calc non Af Amer 43 (L) >60 mL/min   GFR calc Af Amer 50 (L) >60 mL/min    Comment: (NOTE) The eGFR has been calculated using the CKD EPI equation. This calculation has not been validated in all clinical situations. eGFR's persistently <60 mL/min signify possible Chronic Kidney Disease.    Anion gap 7 5 - 15  CBC     Status: Abnormal   Collection Time: 11/08/16  4:48 AM  Result Value Ref Range   WBC 7.5 4.0 - 10.5 K/uL   RBC 3.54 (L) 3.87 - 5.11 MIL/uL   Hemoglobin 10.7 (L) 12.0 - 15.0 g/dL   HCT 35.0 (L) 36.0 - 46.0 %   MCV 98.9 78.0 - 100.0 fL   MCH 30.2 26.0 - 34.0 pg   MCHC 30.6 30.0 - 36.0 g/dL   RDW 14.6 11.5 - 15.5 %   Platelets 174 150 - 400 K/uL  Lactic acid, plasma     Status: None   Collection Time: 11/08/16  4:48 AM  Result Value Ref Range   Lactic Acid, Venous 0.9 0.5 - 1.9 mmol/L    ABGS  Recent Labs  11/07/16 1510  PHART 7.260*   PO2ART 286*  HCO3 21.1   CULTURES Recent Results (from the past 240 hour(s))  Blood culture (routine x 2)     Status: None (Preliminary result)   Collection Time: 11/07/16  3:02 PM  Result Value Ref Range Status   Specimen Description BLOOD RIGHT HAND  Final   Special Requests Blood Culture adequate volume  Final   Culture NO GROWTH < 12 HOURS  Final   Report Status PENDING  Incomplete  Blood culture (routine x 2)     Status: None (Preliminary result)   Collection Time: 11/07/16  3:02 PM  Result Value Ref Range Status   Specimen Description BLOOD LEFT ANTECUBITAL  Final   Special Requests Blood Culture adequate volume  Final   Culture NO GROWTH < 12 HOURS  Final   Report Status PENDING  Incomplete   Studies/Results: Ct Head Wo Contrast  Result Date: 11/07/2016 CLINICAL DATA:  Slurred speech and altered mental status, unresponsive. EXAM: CT HEAD WITHOUT CONTRAST TECHNIQUE: Contiguous axial images were obtained from the base of the skull through the vertex without intravenous contrast. COMPARISON:  None. FINDINGS: Brain: Generalized age related parenchymal atrophy with commensurate dilatation of the ventricles and sulci. No mass, hemorrhage, edema or other evidence of acute parenchymal abnormality. No extra-axial hemorrhage. Vascular: There are chronic calcified atherosclerotic changes of the large vessels at the skull base. No unexpected hyperdense vessel. Skull: Normal. Negative for fracture or focal lesion. Sinuses/Orbits: No acute finding. Other: None. IMPRESSION: Negative head CT.  No intracranial mass, hemorrhage or edema. Electronically Signed   By: Franki Cabot M.D.   On: 11/07/2016 15:36   Dg Chest Portable 1 View  Result Date: 11/07/2016 CLINICAL DATA:  CODE SEPSIS, ALTERED MENTAL STATUS, Pt from [place]. EMS was called for slurred speech and AMS. EMS states pt was unresponsive. Pt showed sign of responsiveness with movement HISTORY OF CHF, HTN,  COPD, ARTHRITIS, CAD, ATRIAL FIB,  CKD, PAF EXAM: PORTABLE CHEST 1 VIEW COMPARISON:  Radiograph 1086 FINDINGS: Normal mediastinum and cardiac silhouette. Normal pulmonary vasculature. No evidence of effusion, infiltrate, or pneumothorax. No acute bony abnormality. Lungs are hyperinflated. IMPRESSION: Hyperinflated lungs.  No focal infiltrate. Electronically Signed   By: Suzy Bouchard M.D.   On: 11/07/2016 15:13    Medications:  Prior to Admission:  Prescriptions Prior to Admission  Medication Sig Dispense Refill Last Dose  . acetaminophen (TYLENOL) 325 MG tablet Take 2 tablets (650 mg total) by mouth every 6 (six) hours as needed for mild pain or fever.   11/05/2016 at Unknown time  . amLODipine (NORVASC) 2.5 MG tablet Take 1 tablet (2.5 mg total) by mouth daily. 30 tablet 6 11/06/2016 at Unknown time  . aspirin EC 81 MG tablet Take 1 tablet (81 mg total) by mouth daily. 30 tablet 6 11/06/2016 at Unknown time  . atorvastatin (LIPITOR) 10 MG tablet Take 10 mg by mouth at bedtime.    11/05/2016 at Unknown time  . guaiFENesin (MUCINEX) 600 MG 12 hr tablet Take 600 mg by mouth 2 (two) times daily.   11/06/2016 at Unknown time  . ipratropium-albuterol (DUONEB) 0.5-2.5 (3) MG/3ML SOLN Take 3 mLs by nebulization 4 (four) times daily.    11/06/2016 at Unknown time  . potassium chloride (K-DUR) 10 MEQ tablet Take 1 tablet (10 mEq total) by mouth 2 (two) times daily. (Patient taking differently: Take 10 mEq by mouth daily. ) 60 tablet 12 11/06/2016 at Unknown time  . sertraline (ZOLOFT) 50 MG tablet Take 50 mg by mouth daily.    11/06/2016 at Unknown time  . Vitamin D, Ergocalciferol, (DRISDOL) 50000 units CAPS capsule Take 50,000 Units by mouth every Thursday.   11/04/2016 at Unknown time   Scheduled: . aspirin EC  81 mg Oral Daily  . enoxaparin (LOVENOX) injection  30 mg Subcutaneous Q24H  . ipratropium-albuterol  3 mL Nebulization QID  . sertraline  50 mg Oral Daily   Continuous: . ceFEPime (MAXIPIME) IV    . dextrose 5 % and 0.2 % NaCl  75 mL/hr at 11/08/16 0700   INO:[id number]  Assesment:She was admitted with altered mental status which may be related to urinary tract infection or to her acute on chronic hypoxic and hypercapnic respiratory failure. 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, coronary disease which is severe and she is not a candidate for any interventional treatment, chronic hypoxic respiratory failure, chronic systolic and diastolic heart failure with severe right heart failure, peripheral arterial disease and mild mental retardation Principal Problem:   Altered mental status Active Problems:   COPD (chronic obstructive pulmonary disease) (HCC)   Hypertension   Mental retardation, idiopathic mild   CKD (chronic kidney disease), stage III   CAD (coronary artery disease)   Chronic systolic CHF (congestive heart failure) (HCC)   Chronic respiratory failure (HCC)   Acute on chronic respiratory failure with hypercapnia (HCC)   Pyuria   Acute lower UTI   Volume depletion    Plan: Continue treatments. She is improving. Use BiPAP as needed. Allow her to eat. Continue to hold amlodipine for now    LOS: 1 day   Shacarra Choe L 11/08/2016, 7:40 AM

## 2016-11-08 NOTE — Progress Notes (Signed)
[place] Progress Note Patient Name: Teresita Fanton DOB: [birth year] MRN: [id number]   Date of Service  11/08/2016  HPI/Events of Note  Low blood pressure.  eICU Interventions  Will give 500 ml NS fluid bolus.     Intervention Category Major Interventions: Other:  Tadeusz Stahl 11/08/2016, 7:13 PM

## 2016-11-08 NOTE — Evaluation (Signed)
Clinical/Bedside Swallow Evaluation Patient Details  Name: Alexis Dillon MRN: [id number] Date of Birth: 1945-10-26  Today's Date: 11/08/2016 Time: SLP Start Time (ACUTE ONLY): 2376 SLP Stop Time (ACUTE ONLY): 1647 SLP Time Calculation (min) (ACUTE ONLY): 31 min  Past Medical History:  Past Medical History:  Diagnosis Date  . Anemia   . Arthritis   . Atrophy of right kidney   . Barrett esophagus   . CAD (coronary artery disease) 04/2013   a. cath 04/2013 showed RCA, LCx occlusion, 50% mid-LAD stenosis, 80% distal stenosis, and first diagonal branch w/ 75% ostial stenosis. previously been evaluated for CABG, however d/t renal insufficiency and mild MR, the decision was previously made to manage her medically.  . Chronic respiratory failure ([place])   . Chronic systolic CHF (congestive heart failure) ([place])   . CKD (chronic kidney disease), stage III   . COPD (chronic obstructive pulmonary disease) ([place])   . Diverticulosis   . GERD (gastroesophageal reflux disease)   . GI bleed    a.  GIB/symptomatic anemia Hgb 6 in 03/2014 (colonoscopy - small bowel AVMs/erosions).  . Hiatal hernia   . History of atrial septal defect    Closure in 50's   . Hyperkalemia   . Hypertension   . Ischemic cardiomyopathy    a. 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  . Mental retardation, idiopathic mild   . NSTEMI (non-ST elevated myocardial infarction) ([place])   . PAF (paroxysmal atrial fibrillation) ([place]) 04/2013   a. s/p prior TEE/DCCV. b. Eliquis stopped after GIB/anemia in 03/2014.  Marland Kitchen Rat bite    a. Tx with rabies vaccines.  . Tobacco abuse    Past Surgical History:  Past Surgical History:  Procedure Laterality Date  . AGILE CAPSULE N/A 04/05/2014   Procedure: AGILE CAPSULE;  Surgeon: Danie Binder, MD;  Location: [facility] ENDO [facility];  Service: Endoscopy;  Laterality: N/A;  . ASD REPAIR     in her 40s  . CARDIAC CATHETERIZATION  04/2013   Left mainstem: LM calcified with long 30%  stenosis. Left anterior descending (LAD):  The LAD is large and wraps the apex.  The mid vessel has 50% stenosis followed by a focal 80% stenosis.  D1 small with ostial 75% stenosis.Left anterior descending (LAD):  The LAD is large and wraps the apex.  The mid vessel has 50% stenosis followed by a focal 80% stenosis.  D1 small with ostial 75% stenosis.     Marland Kitchen CARDIOVERSION N/A 05/08/2013   Procedure: CARDIOVERSION;  Surgeon: Lelon Perla, MD;  Location: [facility] ENDOSCOPY;  Service: Cardiovascular;  Laterality: N/A;  Dr. Zoila Shutter verified with EP pt in a flutter...will cardiovert...  150 joules @ 4:40, using Propofol 40  mg/IV .Marland Kitchen.successful NSR   . COLONOSCOPY N/A 10/24/2013   Dr. Gala Romney: 3 polyps, one removed piecemeal (tubulovillous adenoma). Next TCS 04/2014  . COLONOSCOPY N/A 04/10/2014   Procedure: COLONOSCOPY;  Surgeon: Daneil Dolin, MD;  Location: [facility] ENDO [facility];  Service: Endoscopy;  Laterality: N/A;  . ESOPHAGOGASTRODUODENOSCOPY N/A 10/24/2013   Dr. Rourk:Barrett's without dysplasia. Next EGD 10/2014  . GIVENS CAPSULE STUDY N/A 04/17/2014   Procedure: GIVENS CAPSULE STUDY;  Surgeon: Daneil Dolin, MD;  Location: [facility]O [facility];  Service: Endoscopy;  Laterality: N/A;  . LEFT AND RIGHT HEART CATHETERIZATION WITH CORONARY ANGIOGRAM N/A 05/04/2013   Procedure: LEFT AND RIGHT HEART CATHETERIZATION WITH CORONARY ANGIOGRAM;  Surgeon: Minus Breeding, MD;  Location[facility] CATH LAB;  Service: Cardiovascular;  Laterality: N/A;  . TEE WITHOUT CARDIOVERSION N/A 05/08/2013   Procedure: TRANSESOPHAGEAL ECHOCARDIOGRAM (TEE);  Surgeon: Lelon Perla, MD;  Location: [facility] ENDOSCOPY;  Service: Cardiovascular;  Laterality: N/A;  . TONSILLECTOMY    . TOTAL ABDOMINAL HYSTERECTOMY     in the 36s  . TOTAL HIP ARTHROPLASTY Right    in her 13s   HPI:  She was admitted yesterday with altered mental status. 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 Her blood gas on admission showed CO2 retention with respiratory acidosis. She's being treated for COPD exacerbation and acute on chronic hypoxic and hypercapnic respiratory failure. She is also being treated for dehydration and urinary tract infection. XCR revealed No focal infiltrate. CT was negative. BSE ordered to determine LRD.   Assessment / Plan / Recommendation Clinical Impression  Pt was evaluated at bedside; pt presents with rattle cough at baseline (hx of and still currently smokes) and with cachectic appearance. Pt reportedly did not have her dentures for earlier meals this date and had difficulty with mastication of meats but upon SLP evaluation her dentures had been returned to her. Pt consumed regular textures (crackers), puree (apple sauce) and thin liquids (via straw and cup) consuming all textures/consistencies with no overt s/sx of aspiration. Nurse requested supplements for Pt in between meals, this information was passed on to Dietitian. Recommend continue with regular textures and thin liquids; meds to be administered whole with thin liquids. Pt should have her dentures in for all meals and should be seated upright at 90 degrees for all PO intake, follow universal aspiration precautions. There are no further ST needs noted at this time. ST to sign off.   SLP Visit Diagnosis: Dysphagia, oropharyngeal phase (R13.12)    Aspiration Risk  Mild aspiration risk    Diet Recommendation Regular;Thin liquid   Liquid Administration via: Cup;Straw Medication Administration: Whole meds with liquid Supervision: Patient able to self feed Compensations: Slow rate;Small sips/bites Postural Changes: Seated upright at 90 degrees;Remain upright for at least 30 minutes  after po intake    Other  Recommendations Oral Care Recommendations: Oral care BID   Follow up Recommendations Skilled Nursing facility             Prognosis Prognosis for Safe Diet Advancement: Good      Swallow Study   General Date of Onset: 11/07/16 HPI: She was admitted yesterday with altered mental status. 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 Her blood gas on admission showed CO2 retention with respiratory acidosis. She's being treated for COPD exacerbation and acute on chronic hypoxic and hypercapnic respiratory failure. She is also being treated for dehydration and urinary tract infection. XCR revealed No focal infiltrate. CT was negative. BSE ordered to determine LRD. Type of Study: Bedside Swallow Evaluation Previous Swallow Assessment: none Diet Prior to this Study: Regular;Thin liquids Temperature Spikes Noted: No Respiratory Status: Nasal cannula (2 L/mn) History of Recent Intubation: No Behavior/Cognition: Alert;Cooperative;Pleasant mood Oral Cavity Assessment: Within Functional Limits  Oral Care Completed by SLP: Recent completion by staff Oral Cavity - Dentition: Dentures, top;Dentures, bottom Vision: Functional for self-feeding Self-Feeding Abilities: Able to feed self Patient Positioning: Upright in bed Baseline Vocal Quality: Normal;Hoarse Volitional Cough: Strong Volitional Swallow: Able to elicit    Oral/Motor/Sensory Function Overall Oral Motor/Sensory Function: Within functional limits   Ice Chips Ice chips: Within functional limits   Thin Liquid Thin Liquid: Within functional limits    Nectar Thick Nectar Thick Liquid: Not tested   Honey Thick Honey  Thick Liquid: Not tested   Puree Puree: Within functional limits   Solid   Amelia H. Roddie Mc, CCC-SLP Speech Language Pathologist  Solid: Within functional limits        Wende Bushy 11/08/2016,4:49 PM

## 2016-11-08 NOTE — Progress Notes (Signed)
Dr. Luan Pulling paged x3 without response to report fluctuating blood pressures.

## 2016-11-09 DIAGNOSIS — R6510 Systemic inflammatory response syndrome (SIRS) of non-infectious origin without acute organ dysfunction: Secondary | ICD-10-CM | POA: Diagnosis present

## 2016-11-09 MED ORDER — ALUM & MAG HYDROXIDE-SIMETH 200-200-20 MG/5ML PO SUSP
15.0000 mL | Freq: Four times a day (QID) | ORAL | Status: DC | PRN
  Administered 2016-11-09: 15 mL via ORAL
  Filled 2016-11-09: qty 30

## 2016-11-09 MED ORDER — GUAIFENESIN-DM 100-10 MG/5ML PO SYRP
5.0000 mL | ORAL_SOLUTION | ORAL | Status: DC | PRN
  Administered 2016-11-09: 5 mL via ORAL
  Filled 2016-11-09: qty 5

## 2016-11-09 NOTE — Progress Notes (Signed)
Subjective: She was admitted with altered mental status presumably from infection. She is much better now. She had some hypotension late yesterday and received another bolus of fluids but she is better today. At baseline her blood pressure tends to run rather low. She has COPD but is not complaining of shortness of breath. She has coronary disease but is not complaining of any chest pain. She has atrial fib which is chronic. She has severe left ventricular and right ventricular dysfunction with combined chronic congestive heart failure. She initially was unresponsive. She has done better since she received IV antibiotics and IV fluids. She has had blood gas that showed moderate respiratory acidosis but she's not having any respiratory distress now. She has no complaints.  Objective: Vital signs in last 24 hours: Temp:  [97.4 F (36.3 C)-98 F (36.7 C)] 97.4 F (36.3 C) (06/26 0800) Pulse Rate:  [78-107] 86 (06/26 0800) Resp:  [15-30] 17 (06/26 0800) BP: (73-130)/(40-100) 111/76 (06/26 0800) SpO2:  [88 %-100 %] 95 % (06/26 0800) Weight:  [38.8 kg (85 lb 8.6 oz)] 38.8 kg (85 lb 8.6 oz) (06/26 0425) Weight change: -2.024 kg (-4 lb 7.4 oz) Last BM Date: 11/07/16  Intake/Output from previous day: 06/25 0701 - 06/26 0700 In: 2370 [P.O.:670; I.V.:1650; IV Piggyback:50] Out: 3525 [Urine:3525]  PHYSICAL EXAM General appearance: alert, cooperative, no distress and Very thin Resp: clear to auscultation bilaterally Cardio: regularly irregular rhythm GI: soft, non-tender; bowel sounds normal; no masses,  no organomegaly Extremities: extremities normal, atraumatic, no cyanosis or edema She has some bruising on the extremities  Lab Results:  Results for orders placed or performed during the hospital encounter of 11/07/16 (from the past 48 hour(s))  Urine rapid drug screen (hosp performed)not at [facility]     Status: None   Collection Time: 11/07/16  1:57 PM  Result Value Ref Range   Opiates NONE  DETECTED NONE DETECTED   Cocaine NONE DETECTED NONE DETECTED   Benzodiazepines NONE DETECTED NONE DETECTED   Amphetamines NONE DETECTED NONE DETECTED   Tetrahydrocannabinol NONE DETECTED NONE DETECTED   Barbiturates NONE DETECTED NONE DETECTED    Comment:        DRUG SCREEN FOR MEDICAL PURPOSES ONLY.  IF CONFIRMATION IS NEEDED FOR ANY PURPOSE, NOTIFY LAB WITHIN 5 DAYS.        LOWEST DETECTABLE LIMITS FOR URINE DRUG SCREEN Drug Class       Cutoff (ng/mL) Amphetamine      1000 Barbiturate      200 Benzodiazepine   200 Tricyclics       300 Opiates          300 Cocaine          300 THC              50   Urinalysis, Routine w reflex microscopic (not at [facility])     Status: Abnormal   Collection Time: 11/07/16  1:57 PM  Result Value Ref Range   Color, Urine YELLOW YELLOW   APPearance HAZY (A) CLEAR   Specific Gravity, Urine 1.006 1.005 - 1.030   pH 5.0 5.0 - 8.0   Glucose, UA NEGATIVE NEGATIVE mg/dL   Hgb urine dipstick SMALL (A) NEGATIVE   Bilirubin Urine NEGATIVE NEGATIVE   Ketones, ur NEGATIVE NEGATIVE mg/dL   Protein, ur NEGATIVE NEGATIVE mg/dL   Nitrite NEGATIVE NEGATIVE   Leukocytes, UA LARGE (A) NEGATIVE   RBC / HPF 0-5 0 - 5 RBC/hpf   WBC, UA TOO NUMEROUS TO  COUNT 0 - 5 WBC/hpf   Bacteria, UA FEW (A) NONE SEEN   Squamous Epithelial / LPF NONE SEEN NONE SEEN   WBC Clumps PRESENT   CBG monitoring, ED     Status: None   Collection Time: 11/07/16  1:58 PM  Result Value Ref Range   Glucose-Capillary 91 65 - 99 mg/dL  Protime-INR     Status: None   Collection Time: 11/07/16  2:00 PM  Result Value Ref Range   Prothrombin Time 13.1 11.4 - 15.2 seconds   INR 0.99   APTT     Status: None   Collection Time: 11/07/16  2:00 PM  Result Value Ref Range   aPTT 32 24 - 36 seconds  CBC     Status: None   Collection Time: 11/07/16  2:00 PM  Result Value Ref Range   WBC 8.7 4.0 - 10.5 K/uL   RBC 4.23 3.87 - 5.11 MIL/uL   Hemoglobin 12.9 12.0 - 15.0 g/dL   HCT 40.5 36.0 - 46.0  %   MCV 95.7 78.0 - 100.0 fL   MCH 30.5 26.0 - 34.0 pg   MCHC 31.9 30.0 - 36.0 g/dL   RDW 14.7 11.5 - 15.5 %   Platelets 236 150 - 400 K/uL  Differential     Status: None   Collection Time: 11/07/16  2:00 PM  Result Value Ref Range   Neutrophils Relative % 63 %   Neutro Abs 5.6 1.7 - 7.7 K/uL   Lymphocytes Relative 25 %   Lymphs Abs 2.1 0.7 - 4.0 K/uL   Monocytes Relative 7 %   Monocytes Absolute 0.6 0.1 - 1.0 K/uL   Eosinophils Relative 4 %   Eosinophils Absolute 0.3 0.0 - 0.7 K/uL   Basophils Relative 1 %   Basophils Absolute 0.0 0.0 - 0.1 K/uL  Comprehensive metabolic panel     Status: Abnormal   Collection Time: 11/07/16  2:00 PM  Result Value Ref Range   Sodium 143 135 - 145 mmol/L   Potassium 5.5 (H) 3.5 - 5.1 mmol/L   Chloride 108 101 - 111 mmol/L   CO2 26 22 - 32 mmol/L   Glucose, Bld 103 (H) 65 - 99 mg/dL   BUN 26 (H) 6 - 20 mg/dL   Creatinine, Ser 1.40 (H) 0.44 - 1.00 mg/dL   Calcium 9.8 8.9 - 10.3 mg/dL   Total Protein 7.2 6.5 - 8.1 g/dL   Albumin 4.2 3.5 - 5.0 g/dL   AST 23 15 - 41 U/L   ALT 15 14 - 54 U/L   Alkaline Phosphatase 105 38 - 126 U/L   Total Bilirubin 0.5 0.3 - 1.2 mg/dL   GFR calc non Af Amer 37 (L) >60 mL/min   GFR calc Af Amer 43 (L) >60 mL/min    Comment: (NOTE) The eGFR has been calculated using the CKD EPI equation. This calculation has not been validated in all clinical situations. eGFR's persistently <60 mL/min signify possible Chronic Kidney Disease.    Anion gap 9 5 - 15  Ammonia     Status: None   Collection Time: 11/07/16  2:01 PM  Result Value Ref Range   Ammonia 16 9 - 35 umol/L  Ethanol     Status: None   Collection Time: 11/07/16  2:02 PM  Result Value Ref Range   Alcohol, Ethyl (B) <5 <5 mg/dL    Comment:        LOWEST DETECTABLE LIMIT FOR SERUM ALCOHOL IS 5  mg/dL FOR MEDICAL PURPOSES ONLY   Acetaminophen level     Status: Abnormal   Collection Time: 11/07/16  2:02 PM  Result Value Ref Range   Acetaminophen  (Tylenol), Serum <10 (L) 10 - 30 ug/mL    Comment:        THERAPEUTIC CONCENTRATIONS VARY SIGNIFICANTLY. A RANGE OF 10-30 ug/mL MAY BE AN EFFECTIVE CONCENTRATION FOR MANY PATIENTS. HOWEVER, SOME ARE BEST TREATED AT CONCENTRATIONS OUTSIDE THIS RANGE. ACETAMINOPHEN CONCENTRATIONS >150 ug/mL AT 4 HOURS AFTER INGESTION AND >50 ug/mL AT 12 HOURS AFTER INGESTION ARE OFTEN ASSOCIATED WITH TOXIC REACTIONS.   Salicylate level     Status: None   Collection Time: 11/07/16  2:02 PM  Result Value Ref Range   Salicylate Lvl <6.3 2.8 - 30.0 mg/dL  I-stat troponin, ED (not at [facility])     Status: None   Collection Time: 11/07/16  2:06 PM  Result Value Ref Range   Troponin i, poc 0.01 0.00 - 0.08 ng/mL   Comment 3            Comment: Due to the release kinetics of cTnI, a negative result within the first hours of the onset of symptoms does not rule out myocardial infarction with certainty. If myocardial infarction is still suspected, repeat the test at appropriate intervals.   I-Stat CG4 Lactic Acid, ED  (not at  [facility])     Status: None   Collection Time: 11/07/16  2:39 PM  Result Value Ref Range   Lactic Acid, Venous 1.38 0.5 - 1.9 mmol/L  Blood culture (routine x 2)     Status: None (Preliminary result)   Collection Time: 11/07/16  3:02 PM  Result Value Ref Range   Specimen Description BLOOD RIGHT HAND    Special Requests Blood Culture adequate volume    Culture NO GROWTH < 24 HOURS    Report Status PENDING   Blood culture (routine x 2)     Status: None (Preliminary result)   Collection Time: 11/07/16  3:02 PM  Result Value Ref Range   Specimen Description BLOOD LEFT ANTECUBITAL    Special Requests Blood Culture adequate volume    Culture NO GROWTH < 24 HOURS    Report Status PENDING   Blood gas, arterial ([facility] & [facility] ONLY)     Status: Abnormal   Collection Time: 11/07/16  3:10 PM  Result Value Ref Range   O2 Content 4.0 L/min   Delivery systems NASAL CANNULA    pH, Arterial  7.260 (L) 7.350 - 7.450    Comment: RBV M.MITCHELL,RN AT 1515 BY V.LAWSON,RRT ON 11/07/16   pCO2 arterial 52.7 (H) 32.0 - 48.0 mmHg    Comment: RBV M.MITCHELL,RN AT 1515 BY V.LAWSON,RRT ON 11/07/16   pO2, Arterial 286 (H) 83.0 - 108.0 mmHg   Bicarbonate 21.1 20.0 - 28.0 mmol/L   Acid-base deficit 3.2 (H) 0.0 - 2.0 mmol/L   O2 Saturation 99.1 %   Patient temperature 37.0    Collection site BRACHIAL ARTERY    Drawn by COLLECTED BY RT    Sample type ARTERIAL    Allens test (pass/fail) NOT INDICATED (A) PASS  I-Stat CG4 Lactic Acid, ED  (not at  [facility])     Status: None   Collection Time: 11/07/16  6:01 PM  Result Value Ref Range   Lactic Acid, Venous 1.12 0.5 - 1.9 mmol/L  MRSA PCR Screening     Status: None   Collection Time: 11/07/16  6:51 PM  Result Value Ref  Range   MRSA by PCR NEGATIVE NEGATIVE    Comment:        The GeneXpert MRSA Assay (FDA approved for NASAL specimens only), is one component of a comprehensive MRSA colonization surveillance program. It is not intended to diagnose MRSA infection nor to guide or monitor treatment for MRSA infections.   Basic metabolic panel     Status: Abnormal   Collection Time: 11/08/16  4:48 AM  Result Value Ref Range   Sodium 140 135 - 145 mmol/L   Potassium 3.7 3.5 - 5.1 mmol/L    Comment: DELTA CHECK NOTED   Chloride 109 101 - 111 mmol/L   CO2 24 22 - 32 mmol/L   Glucose, Bld 94 65 - 99 mg/dL   BUN 21 (H) 6 - 20 mg/dL   Creatinine, Ser 1.22 (H) 0.44 - 1.00 mg/dL   Calcium 8.1 (L) 8.9 - 10.3 mg/dL   GFR calc non Af Amer 43 (L) >60 mL/min   GFR calc Af Amer 50 (L) >60 mL/min    Comment: (NOTE) The eGFR has been calculated using the CKD EPI equation. This calculation has not been validated in all clinical situations. eGFR's persistently <60 mL/min signify possible Chronic Kidney Disease.    Anion gap 7 5 - 15  CBC     Status: Abnormal   Collection Time: 11/08/16  4:48 AM  Result Value Ref Range   WBC 7.5 4.0 - 10.5 K/uL    RBC 3.54 (L) 3.87 - 5.11 MIL/uL   Hemoglobin 10.7 (L) 12.0 - 15.0 g/dL   HCT 35.0 (L) 36.0 - 46.0 %   MCV 98.9 78.0 - 100.0 fL   MCH 30.2 26.0 - 34.0 pg   MCHC 30.6 30.0 - 36.0 g/dL   RDW 14.6 11.5 - 15.5 %   Platelets 174 150 - 400 K/uL  Lactic acid, plasma     Status: None   Collection Time: 11/08/16  4:48 AM  Result Value Ref Range   Lactic Acid, Venous 0.9 0.5 - 1.9 mmol/L    ABGS  Recent Labs  11/07/16 1510  PHART 7.260*  PO2ART 286*  HCO3 21.1   CULTURES Recent Results (from the past 240 hour(s))  Blood culture (routine x 2)     Status: None (Preliminary result)   Collection Time: 11/07/16  3:02 PM  Result Value Ref Range Status   Specimen Description BLOOD RIGHT HAND  Final   Special Requests Blood Culture adequate volume  Final   Culture NO GROWTH < 24 HOURS  Final   Report Status PENDING  Incomplete  Blood culture (routine x 2)     Status: None (Preliminary result)   Collection Time: 11/07/16  3:02 PM  Result Value Ref Range Status   Specimen Description BLOOD LEFT ANTECUBITAL  Final   Special Requests Blood Culture adequate volume  Final   Culture NO GROWTH < 24 HOURS  Final   Report Status PENDING  Incomplete  MRSA PCR Screening     Status: None   Collection Time: 11/07/16  6:51 PM  Result Value Ref Range Status   MRSA by PCR NEGATIVE NEGATIVE Final    Comment:        The GeneXpert MRSA Assay (FDA approved for NASAL specimens only), is one component of a comprehensive MRSA colonization surveillance program. It is not intended to diagnose MRSA infection nor to guide or monitor treatment for MRSA infections.    Studies/Results: Ct Head Wo Contrast  Result Date: 11/07/2016 CLINICAL  DATA:  Slurred speech and altered mental status, unresponsive. EXAM: CT HEAD WITHOUT CONTRAST TECHNIQUE: Contiguous axial images were obtained from the base of the skull through the vertex without intravenous contrast. COMPARISON:  None. FINDINGS: Brain: Generalized age  related parenchymal atrophy with commensurate dilatation of the ventricles and sulci. No mass, hemorrhage, edema or other evidence of acute parenchymal abnormality. No extra-axial hemorrhage. Vascular: There are chronic calcified atherosclerotic changes of the large vessels at the skull base. No unexpected hyperdense vessel. Skull: Normal. Negative for fracture or focal lesion. Sinuses/Orbits: No acute finding. Other: None. IMPRESSION: Negative head CT.  No intracranial mass, hemorrhage or edema. Electronically Signed   By: Bary Richard M.D.   On: 11/07/2016 15:36   Dg Chest Portable 1 View  Result Date: 11/07/2016 CLINICAL DATA:  CODE SEPSIS, ALTERED MENTAL STATUS, Pt from [facility]. EMS was called for slurred speech and AMS. EMS states pt was unresponsive. Pt showed sign of responsiveness with movement HISTORY OF CHF, HTN, COPD, ARTHRITIS, CAD, ATRIAL FIB, CKD, PAF EXAM: PORTABLE CHEST 1 VIEW COMPARISON:  Radiograph 1086 FINDINGS: Normal mediastinum and cardiac silhouette. Normal pulmonary vasculature. No evidence of effusion, infiltrate, or pneumothorax. No acute bony abnormality. Lungs are hyperinflated. IMPRESSION: Hyperinflated lungs.  No focal infiltrate. Electronically Signed   By: Genevive Bi M.D.   On: 11/07/2016 15:13    Medications:  Prior to Admission:  Prescriptions Prior to Admission  Medication Sig Dispense Refill Last Dose  . acetaminophen (TYLENOL) 325 MG tablet Take 2 tablets (650 mg total) by mouth every 6 (six) hours as needed for mild pain or fever.   11/05/2016 at Unknown time  . amLODipine (NORVASC) 2.5 MG tablet Take 1 tablet (2.5 mg total) by mouth daily. 30 tablet 6 11/06/2016 at Unknown time  . aspirin EC 81 MG tablet Take 1 tablet (81 mg total) by mouth daily. 30 tablet 6 11/06/2016 at Unknown time  . atorvastatin (LIPITOR) 10 MG tablet Take 10 mg by mouth at bedtime.    11/05/2016 at Unknown time  . guaiFENesin (MUCINEX) 600 MG 12 hr tablet Take 600 mg by mouth 2 (two)  times daily.   11/06/2016 at Unknown time  . ipratropium-albuterol (DUONEB) 0.5-2.5 (3) MG/3ML SOLN Take 3 mLs by nebulization 4 (four) times daily.    11/06/2016 at Unknown time  . potassium chloride (K-DUR) 10 MEQ tablet Take 1 tablet (10 mEq total) by mouth 2 (two) times daily. (Patient taking differently: Take 10 mEq by mouth daily. ) 60 tablet 12 11/06/2016 at Unknown time  . sertraline (ZOLOFT) 50 MG tablet Take 50 mg by mouth daily.    11/06/2016 at Unknown time  . Vitamin D, Ergocalciferol, (DRISDOL) 50000 units CAPS capsule Take 50,000 Units by mouth every Thursday.   11/04/2016 at Unknown time   Scheduled: . aspirin EC  81 mg Oral Daily  . enoxaparin (LOVENOX) injection  30 mg Subcutaneous Q24H  . ipratropium-albuterol  3 mL Nebulization QID  . sertraline  50 mg Oral Daily   Continuous: . ceFEPime (MAXIPIME) IV Stopped (11/08/16 1527)  . dextrose 5 % and 0.2 % NaCl 75 mL/hr at 11/09/16 0800   NJS:[id number], albuterol, guaiFENesin-dextromethorphan  Assesment:She was admitted with altered mental status. She has improved. This is thought to be related to urinary tract infection. Urine culture still pending  She was dehydrated on admission and that is better.  She has had some hypotension and I think she probably has systemic inflammatory response syndrome from her UTI. Her blood pressure  is much better.  She has COPD which is severe but stable.  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    LOS: 2 days   Gabryela Kimbrell L 11/09/2016, 8:21 AM

## 2016-11-09 NOTE — Care Management Note (Signed)
Case Management Note  Patient Details  Name: Alexis Dillon MRN: [id number] Date of Birth: [birth year]  Subjective/Objective: Adm with AMS r/t infectious process/UTI. From [facility].                  Action/Plan: Plans to return to [facility] at time of discharge. CSW aware.    Expected Discharge Date:  11/12/16               Expected Discharge Plan:  Skilled Nursing Facility  In-House Referral:  Clinical Social Work  Discharge planning Services  CM Consult  Post Acute Care Choice:    Choice offered to:     DME Arranged:    DME Agency:     HH Arranged:    [place] Agency:     Status of Service:  Completed, signed off  If discussed at [employer], dates discussed:    Additional Comments:  Alexis Dillon, Chauncey Reading, RN 11/09/2016, 9:50 AM

## 2016-11-09 NOTE — Clinical Social Work Note (Signed)
Clinical Social Work Assessment  Patient Details  Name: Alexis Dillon MRN: [id number] Date of Birth: 1945-09-14  Date of referral:  11/09/16               Reason for consult:  Discharge Planning                Permission sought to share information with:    Permission granted to share information::     Name::        Agency::     Relationship::     Contact Information:  Andreea Arca, sister, listed on the chart.   Housing/Transportation Living arrangements for the past 2 months:  [place], [place] of Information:  Other (Comment Required) (Sister, [name]) Patient Interpreter Needed:  None Criminal Activity/Legal Involvement Pertinent to Current Situation/Hospitalization:  No - Comment as needed Significant Relationships:  Siblings Lives with:  Facility Resident Do you feel safe going back to the place where you live?  Yes Need for family participation in patient care:  Yes (Comment)  Care giving concerns:  Facility resident. None identified at baseline.    Social Worker assessment / plan: Patient has been a resident at [facility] for the past three weeks for rehab, however family desires for her to be a long term resident once she completes rehab.  Patient ambulates with a walker, but PT is trying to get her to use a cane when she ambulates in the hallway.  ADLs are completed by staff. LCSW left a message for Debbie at [facility].   Employment status:  Retired Nurse, adult PT Recommendations:  Not assessed at this time Information / Referral to community resources:     Patient/Family's Response to care:  Family is agreeable for return to facility.   Patient/Family's Understanding of and Emotional Response: Family is aware of patient's diagnosis, treatment and prognosis.   Emotional Assessment Appearance:  Appears stated age Attitude/Demeanor/Rapport:  Unable to Assess Affect (typically observed):  Unable to  Assess Orientation:  Oriented to Self, Oriented to Place Alcohol / Substance use:  Not Applicable Psych involvement (Current and /or in the community):  No (Comment)  Discharge Needs  Concerns to be addressed:  Discharge Planning Concerns Readmission within the last 30 days:  Yes Current discharge risk:  None Barriers to Discharge:  No Barriers Identified   Ihor Gully, LCSW 11/09/2016, 11:08 AM

## 2016-11-09 NOTE — NC FL2 (Signed)
[place] MEDICAID FL2 LEVEL OF CARE SCREENING TOOL     IDENTIFICATION  Patient Name: Alexis Dillon Birthdate: [birth year] Sex: female Admission Date (Current Location): 11/07/2016  [facility] and [place] Number:  [employer] and Address:  [place] [address], [place]      Provider Number: [id number]  Attending Physician Name and Address:  Sinda Du, MD  Relative Name and Phone Number:       Current Level of Care:   Recommended Level of Care:   Prior Approval Number:    Date Approved/Denied:   [place] Number: [id number] ([id number])  Discharge Plan: SNF    Current Diagnoses: Patient Active Problem List   Diagnosis Date Noted  . SIRS (systemic inflammatory response syndrome) ([place]) 11/09/2016  . Altered mental status 11/07/2016  . Pyuria 11/07/2016  . Acute lower UTI 11/07/2016  . Volume depletion 11/07/2016  . Acute on chronic respiratory failure with hypercapnia ([place]) 09/23/2014  . Acute on chronic respiratory failure ([place]) 09/23/2014  . Bilateral carotid bruits 07/31/2014  . Chronic combined systolic and diastolic CHF (congestive heart failure) ([place]) 07/18/2014  . Animal bite of hand 07/18/2014  . Chronic respiratory failure ([place]) 07/18/2014  . Anemia 07/18/2014  . Unstable angina ([place]) 07/15/2014  . Ischemic cardiomyopathy   . Chronic systolic CHF (congestive heart failure) ([place])   . CKD (chronic kidney disease) stage 4, GFR 15-29 ml/min (HCC)   . Hyperlipidemia   . CKD (chronic kidney disease), stage III 05/05/2013  . Acute coronary syndrome ([place]) 05/03/2013  . COPD (chronic obstructive pulmonary disease) ([place])   . Hypertension   . History of atrial septal defect   . Mental retardation, idiopathic mild   . CAD (coronary artery disease) 04/16/2013  . PAF (paroxysmal atrial fibrillation) ([place]) 04/16/2013    Orientation RESPIRATION BLADDER Height & Weight     Self  O2 (2L) Continent Weight: 85 lb 8.6 oz (38.8  kg) Height:  5\' 2"  (157.5 cm)  BEHAVIORAL SYMPTOMS/MOOD NEUROLOGICAL BOWEL NUTRITION STATUS      Continent Diet (Heart Healthy)  AMBULATORY STATUS COMMUNICATION OF NEEDS Skin   Limited Assist Verbally Normal                       Personal Care Assistance Level of Assistance  Bathing, Feeding, Dressing Bathing Assistance: Limited assistance Feeding assistance: Independent Dressing Assistance: Limited assistance     Functional Limitations Info  Sight, Hearing, Speech Sight Info: Adequate Hearing Info: Adequate Speech Info: Adequate    SPECIAL CARE FACTORS FREQUENCY                       Contractures Contractures Info: Not present    Additional Factors Info  Psychotropic Code Status Info: Partial   Psychotropic Info: Zoloft         Current Medications (11/09/2016):  This is the current hospital active medication list Current Facility-Administered Medications  Medication Dose Route Frequency Provider Last Rate Last Dose  . acetaminophen (TYLENOL) tablet 650 mg  650 mg Oral Q4H PRN Sinda Du, MD   650 mg at 11/09/16 1203  . albuterol (PROVENTIL) (2.5 MG/3ML) 0.083% nebulizer solution 2.5 mg  2.5 mg Nebulization Q4H PRN Roney Jaffe, MD   2.5 mg at 11/07/16 2204  . aspirin EC tablet 81 mg  81 mg Oral Daily Roney Jaffe, MD   81 mg at 11/09/16 0908  . ceFEPIme (MAXIPIME) 1 g in dextrose 5 % 50  mL IVPB  1 g Intravenous Q24H Dorie Rank, MD   Stopped at 11/08/16 1527  . dextrose 5 % and 0.2 % NaCl infusion   Intravenous Continuous Roney Jaffe, MD 75 mL/hr at 11/09/16 1300    . enoxaparin (LOVENOX) injection 30 mg  30 mg Subcutaneous Q24H Roney Jaffe, MD   30 mg at 11/08/16 2001  . guaiFENesin-dextromethorphan (ROBITUSSIN DM) 100-10 MG/5ML syrup 5 mL  5 mL Oral Q4H PRN Sinda Du, MD   5 mL at 11/09/16 0701  . ipratropium-albuterol (DUONEB) 0.5-2.5 (3) MG/3ML nebulizer solution 3 mL  3 mL Nebulization QID Roney Jaffe, MD   3 mL at 11/09/16  1257  . sertraline (ZOLOFT) tablet 50 mg  50 mg Oral Daily Roney Jaffe, MD   50 mg at 11/09/16 0908     Discharge Medications: Please see discharge summary for a list of discharge medications.  Relevant Imaging Results:  Relevant Lab Results:   Additional Information    Mana Morison, Clydene Pugh, LCSW

## 2016-11-10 DIAGNOSIS — N183 Chronic kidney disease, stage 3 (moderate): Secondary | ICD-10-CM | POA: Diagnosis not present

## 2016-11-10 DIAGNOSIS — M6281 Muscle weakness (generalized): Secondary | ICD-10-CM | POA: Diagnosis not present

## 2016-11-10 DIAGNOSIS — J9610 Chronic respiratory failure, unspecified whether with hypoxia or hypercapnia: Secondary | ICD-10-CM | POA: Diagnosis not present

## 2016-11-10 DIAGNOSIS — N189 Chronic kidney disease, unspecified: Secondary | ICD-10-CM | POA: Diagnosis not present

## 2016-11-10 DIAGNOSIS — Z79899 Other long term (current) drug therapy: Secondary | ICD-10-CM | POA: Diagnosis not present

## 2016-11-10 DIAGNOSIS — M1711 Unilateral primary osteoarthritis, right knee: Secondary | ICD-10-CM | POA: Diagnosis not present

## 2016-11-10 DIAGNOSIS — I481 Persistent atrial fibrillation: Secondary | ICD-10-CM | POA: Diagnosis not present

## 2016-11-10 DIAGNOSIS — M25561 Pain in right knee: Secondary | ICD-10-CM | POA: Diagnosis not present

## 2016-11-10 DIAGNOSIS — K219 Gastro-esophageal reflux disease without esophagitis: Secondary | ICD-10-CM | POA: Diagnosis not present

## 2016-11-10 DIAGNOSIS — M25569 Pain in unspecified knee: Secondary | ICD-10-CM | POA: Diagnosis not present

## 2016-11-10 DIAGNOSIS — S90811S Abrasion, right foot, sequela: Secondary | ICD-10-CM | POA: Diagnosis not present

## 2016-11-10 DIAGNOSIS — I739 Peripheral vascular disease, unspecified: Secondary | ICD-10-CM | POA: Diagnosis not present

## 2016-11-10 DIAGNOSIS — D649 Anemia, unspecified: Secondary | ICD-10-CM | POA: Diagnosis not present

## 2016-11-10 DIAGNOSIS — M25469 Effusion, unspecified knee: Secondary | ICD-10-CM | POA: Diagnosis not present

## 2016-11-10 DIAGNOSIS — E440 Moderate protein-calorie malnutrition: Secondary | ICD-10-CM | POA: Diagnosis not present

## 2016-11-10 DIAGNOSIS — N390 Urinary tract infection, site not specified: Secondary | ICD-10-CM | POA: Diagnosis not present

## 2016-11-10 DIAGNOSIS — Z9181 History of falling: Secondary | ICD-10-CM | POA: Diagnosis not present

## 2016-11-10 DIAGNOSIS — M25562 Pain in left knee: Secondary | ICD-10-CM | POA: Diagnosis not present

## 2016-11-10 DIAGNOSIS — R2681 Unsteadiness on feet: Secondary | ICD-10-CM | POA: Diagnosis not present

## 2016-11-10 DIAGNOSIS — I2510 Atherosclerotic heart disease of native coronary artery without angina pectoris: Secondary | ICD-10-CM | POA: Diagnosis not present

## 2016-11-10 DIAGNOSIS — J9621 Acute and chronic respiratory failure with hypoxia: Secondary | ICD-10-CM | POA: Diagnosis not present

## 2016-11-10 DIAGNOSIS — J9622 Acute and chronic respiratory failure with hypercapnia: Secondary | ICD-10-CM | POA: Diagnosis not present

## 2016-11-10 DIAGNOSIS — I5022 Chronic systolic (congestive) heart failure: Secondary | ICD-10-CM | POA: Diagnosis not present

## 2016-11-10 DIAGNOSIS — J449 Chronic obstructive pulmonary disease, unspecified: Secondary | ICD-10-CM | POA: Diagnosis not present

## 2016-11-10 DIAGNOSIS — I10 Essential (primary) hypertension: Secondary | ICD-10-CM | POA: Diagnosis not present

## 2016-11-10 DIAGNOSIS — I129 Hypertensive chronic kidney disease with stage 1 through stage 4 chronic kidney disease, or unspecified chronic kidney disease: Secondary | ICD-10-CM | POA: Diagnosis not present

## 2016-11-10 LAB — URINE CULTURE

## 2016-11-10 NOTE — Care Management Important Message (Signed)
Important Message  Patient Details  Name: Ronit Cranfield MRN: [id number] Date of Birth: 05-29-1945   Medicare Important Message Given:  Yes    Kimorah Ridolfi, Chauncey Reading, RN 11/10/2016, 1:20 PM

## 2016-11-10 NOTE — Clinical Social Work Note (Signed)
LCSW left a messge for Debbie at [facility] and advised that patient was discharging. LCSW sent clinicals via [employer].  LCSW spoke with patient's sister, [name], and advised that patient was discharging.   LCSW spoke with patient's sister in law [name] who indicated that patient's brother, Gene, would pick patient up and transport her back to the facility.     Kristel Durkee, Clydene Pugh, LCSW

## 2016-11-10 NOTE — Discharge Summary (Signed)
Physician Discharge Summary  Patient ID: Alexis Dillon MRN: [id number] DOB/AGE: 1945/07/15 71 y.o. Primary Care Physician:Elajah Kunsman, Percell Miller, MD Admit date: 11/07/2016 Discharge date: 11/10/2016    Discharge Diagnoses:   Principal Problem:   Altered mental status Active Problems:   COPD (chronic obstructive pulmonary disease) (HCC)   Hypertension   Mental retardation, idiopathic mild   CKD (chronic kidney disease), stage III   CAD (coronary artery disease)   Chronic systolic CHF (congestive heart failure) (HCC)   Chronic respiratory failure (HCC)   Acute on chronic respiratory failure with hypercapnia (HCC)   Pyuria   Acute lower UTI   Volume depletion   SIRS (systemic inflammatory response syndrome) (HCC)  Metabolic encephalopathy causing altered mental status Allergies as of 11/10/2016   No Known Allergies     Medication List    TAKE these medications   acetaminophen 325 MG tablet Commonly known as:  TYLENOL Take 2 tablets (650 mg total) by mouth every 6 (six) hours as needed for mild pain or fever.   amLODipine 2.5 MG tablet Commonly known as:  NORVASC Take 1 tablet (2.5 mg total) by mouth daily.   aspirin EC 81 MG tablet Take 1 tablet (81 mg total) by mouth daily.   atorvastatin 10 MG tablet Commonly known as:  LIPITOR Take 10 mg by mouth at bedtime.   guaiFENesin 600 MG 12 hr tablet Commonly known as:  MUCINEX Take 600 mg by mouth 2 (two) times daily.   ipratropium-albuterol 0.5-2.5 (3) MG/3ML Soln Commonly known as:  DUONEB Take 3 mLs by nebulization 4 (four) times daily.   potassium chloride 10 MEQ tablet Commonly known as:  K-DUR Take 1 tablet (10 mEq total) by mouth 2 (two) times daily. What changed:  when to take this   sertraline 50 MG tablet Commonly known as:  ZOLOFT Take 50 mg by mouth daily.   Vitamin D (Ergocalciferol) 50000 units Caps capsule Commonly known as:  DRISDOL Take 50,000 Units by mouth every Thursday.       Discharged  Condition:Improved    Consults: None  Significant Diagnostic Studies: Ct Head Wo Contrast  Result Date: 11/07/2016 CLINICAL DATA:  Slurred speech and altered mental status, unresponsive. EXAM: CT HEAD WITHOUT CONTRAST TECHNIQUE: Contiguous axial images were obtained from the base of the skull through the vertex without intravenous contrast. COMPARISON:  None. FINDINGS: Brain: Generalized age related parenchymal atrophy with commensurate dilatation of the ventricles and sulci. No mass, hemorrhage, edema or other evidence of acute parenchymal abnormality. No extra-axial hemorrhage. Vascular: There are chronic calcified atherosclerotic changes of the large vessels at the skull base. No unexpected hyperdense vessel. Skull: Normal. Negative for fracture or focal lesion. Sinuses/Orbits: No acute finding. Other: None. IMPRESSION: Negative head CT.  No intracranial mass, hemorrhage or edema. Electronically Signed   By: Franki Cabot M.D.   On: 11/07/2016 15:36   Dg Chest Portable 1 View  Result Date: 11/07/2016 CLINICAL DATA:  CODE SEPSIS, ALTERED MENTAL STATUS, Pt from [facility]. EMS was called for slurred speech and AMS. EMS states pt was unresponsive. Pt showed sign of responsiveness with movement HISTORY OF CHF, HTN, COPD, ARTHRITIS, CAD, ATRIAL FIB, CKD, PAF EXAM: PORTABLE CHEST 1 VIEW COMPARISON:  Radiograph 1086 FINDINGS: Normal mediastinum and cardiac silhouette. Normal pulmonary vasculature. No evidence of effusion, infiltrate, or pneumothorax. No acute bony abnormality. Lungs are hyperinflated. IMPRESSION: Hyperinflated lungs.  No focal infiltrate. Electronically Signed   By: Suzy Bouchard M.D.   On: 11/07/2016 15:13    Lab  Results: Basic Metabolic Panel:  Recent Labs  11/07/16 1400 11/08/16 0448  NA 143 140  K 5.5* 3.7  CL 108 109  CO2 26 24  GLUCOSE 103* 94  BUN 26* 21*  CREATININE 1.40* 1.22*  CALCIUM 9.8 8.1*   Liver Function Tests:  Recent Labs  11/07/16 1400  AST 23  ALT  15  ALKPHOS 105  BILITOT 0.5  PROT 7.2  ALBUMIN 4.2     CBC:  Recent Labs  11/07/16 1400 11/08/16 0448  WBC 8.7 7.5  NEUTROABS 5.6  --   HGB 12.9 10.7*  HCT 40.5 35.0*  MCV 95.7 98.9  PLT 236 174    Recent Results (from the past 240 hour(s))  Urine Culture     Status: Abnormal   Collection Time: 11/07/16  1:57 PM  Result Value Ref Range Status   Specimen Description URINE, CLEAN CATCH  Final   Special Requests NONE  Final   Culture >=100,000 COLONIES/mL KLEBSIELLA PNEUMONIAE (A)  Final   Report Status 11/10/2016 FINAL  Final   Organism ID, Bacteria KLEBSIELLA PNEUMONIAE (A)  Final      Susceptibility   Klebsiella pneumoniae - MIC*    AMPICILLIN RESISTANT Resistant     CEFAZOLIN <=4 SENSITIVE Sensitive     CEFTRIAXONE <=1 SENSITIVE Sensitive     CIPROFLOXACIN <=0.25 SENSITIVE Sensitive     GENTAMICIN <=1 SENSITIVE Sensitive     IMIPENEM <=0.25 SENSITIVE Sensitive     NITROFURANTOIN 64 INTERMEDIATE Intermediate     TRIMETH/SULFA <=20 SENSITIVE Sensitive     AMPICILLIN/SULBACTAM <=2 SENSITIVE Sensitive     PIP/TAZO <=4 SENSITIVE Sensitive     Extended ESBL NEGATIVE Sensitive     * >=100,000 COLONIES/mL KLEBSIELLA PNEUMONIAE  Blood culture (routine x 2)     Status: None (Preliminary result)   Collection Time: 11/07/16  3:02 PM  Result Value Ref Range Status   Specimen Description BLOOD RIGHT HAND  Final   Special Requests Blood Culture adequate volume  Final   Culture NO GROWTH 3 DAYS  Final   Report Status PENDING  Incomplete  Blood culture (routine x 2)     Status: None (Preliminary result)   Collection Time: 11/07/16  3:02 PM  Result Value Ref Range Status   Specimen Description BLOOD LEFT ANTECUBITAL  Final   Special Requests Blood Culture adequate volume  Final   Culture NO GROWTH 3 DAYS  Final   Report Status PENDING  Incomplete  MRSA PCR Screening     Status: None   Collection Time: 11/07/16  6:51 PM  Result Value Ref Range Status   MRSA by PCR  NEGATIVE NEGATIVE Final    Comment:        The GeneXpert MRSA Assay (FDA approved for NASAL specimens only), is one component of a comprehensive MRSA colonization surveillance program. It is not intended to diagnose MRSA infection nor to guide or monitor treatment for MRSA infections.      Hospital Course: This is a 71 year old who came from skilled care facility with altered mental status. She appeared to be dehydrated and had acute kidney injury and also had acute on chronic hypoxic and hypercapnic respiratory failure. She was treated with IV fluids because she was hypotensive and I think she had systemic inflammatory response syndrome but I don't think she had full-blown sepsis. She was started on cefepime and improved markedly. By the time of discharge she was back at baseline. She will be transferred back to skilled care facility.  I did not give her an antibiotic order because we don't have sensitivities yet. I will call nursing home and they will call me tomorrow I should have sensitivities at that point and no what she needs for further treatment. She will be on oxygen at 2 L. She will continue her other medications. Heart healthy diet Discharge Exam: Blood pressure 116/65, pulse (!) 104, temperature 98.1 F (36.7 C), temperature source Oral, resp. rate (!) 23, height 5\' 2"  (1.575 m), weight 38.8 kg (85 lb 8.6 oz), SpO2 100 %. She is awake and alert. Her chest is clear. Abdomen is soft she is back at her baseline mental status  Disposition: Discharge to skilled care facility today  Discharge Instructions    Diet - low sodium heart healthy    Complete by:  As directed    Increase activity slowly    Complete by:  As directed         Signed: Dravon Nott L   11/10/2016, 8:18 AM

## 2016-11-10 NOTE — Progress Notes (Signed)
Subjective: She says she feels better and wants to go back to the nursing home. She has no new complaints. Her breathing is okay. She has a urinary tract infection and we know she is growing gram-negative rods but we don't have identification or sensitivities yet. She has responded well to cefepime. I would like her to get a dose of cefepime today transferred to the skilled care facility and then I can let them know when we get the culture and sensitivity back what is going to be the best treatment for her  Objective: Vital signs in last 24 hours: Temp:  [97.8 F (36.6 C)-98.1 F (36.7 C)] 98.1 F (36.7 C) (06/26 2000) Pulse Rate:  [71-114] 104 (06/27 0600) Resp:  [16-29] 23 (06/27 0600) BP: (83-128)/(34-108) 116/65 (06/27 0400) SpO2:  [90 %-100 %] 100 % (06/27 0600) Weight change:  Last BM Date: 11/07/16  Intake/Output from previous day: 06/26 0701 - 06/27 0700 In: 3605 [P.O.:1680; I.V.:1875; IV Piggyback:50] Out: 3050 [Urine:3050]  PHYSICAL EXAM General appearance: alert, cooperative and no distress Resp: clear to auscultation bilaterally Cardio: regular rate and rhythm, S1, S2 normal, no murmur, click, rub or gallop GI: soft, non-tender; bowel sounds normal; no masses,  no organomegaly Extremities: extremities normal, atraumatic, no cyanosis or edema Mucous membranes are moist  Lab Results:  No results found for this or any previous visit (from the past 48 hour(s)).  ABGS  Recent Labs  11/07/16 1510  PHART 7.260*  PO2ART 286*  HCO3 21.1   CULTURES Recent Results (from the past 240 hour(s))  Urine Culture     Status: Abnormal   Collection Time: 11/07/16  1:57 PM  Result Value Ref Range Status   Specimen Description URINE, CLEAN CATCH  Final   Special Requests NONE  Final   Culture >=100,000 COLONIES/mL KLEBSIELLA PNEUMONIAE (A)  Final   Report Status 11/10/2016 FINAL  Final   Organism ID, Bacteria KLEBSIELLA PNEUMONIAE (A)  Final      Susceptibility   Klebsiella  pneumoniae - MIC*    AMPICILLIN RESISTANT Resistant     CEFAZOLIN <=4 SENSITIVE Sensitive     CEFTRIAXONE <=1 SENSITIVE Sensitive     CIPROFLOXACIN <=0.25 SENSITIVE Sensitive     GENTAMICIN <=1 SENSITIVE Sensitive     IMIPENEM <=0.25 SENSITIVE Sensitive     NITROFURANTOIN 64 INTERMEDIATE Intermediate     TRIMETH/SULFA <=20 SENSITIVE Sensitive     AMPICILLIN/SULBACTAM <=2 SENSITIVE Sensitive     PIP/TAZO <=4 SENSITIVE Sensitive     Extended ESBL NEGATIVE Sensitive     * >=100,000 COLONIES/mL KLEBSIELLA PNEUMONIAE  Blood culture (routine x 2)     Status: None (Preliminary result)   Collection Time: 11/07/16  3:02 PM  Result Value Ref Range Status   Specimen Description BLOOD RIGHT HAND  Final   Special Requests Blood Culture adequate volume  Final   Culture NO GROWTH 3 DAYS  Final   Report Status PENDING  Incomplete  Blood culture (routine x 2)     Status: None (Preliminary result)   Collection Time: 11/07/16  3:02 PM  Result Value Ref Range Status   Specimen Description BLOOD LEFT ANTECUBITAL  Final   Special Requests Blood Culture adequate volume  Final   Culture NO GROWTH 3 DAYS  Final   Report Status PENDING  Incomplete  MRSA PCR Screening     Status: None   Collection Time: 11/07/16  6:51 PM  Result Value Ref Range Status   MRSA by PCR NEGATIVE NEGATIVE Final  Comment:        The GeneXpert MRSA Assay (FDA approved for NASAL specimens only), is one component of a comprehensive MRSA colonization surveillance program. It is not intended to diagnose MRSA infection nor to guide or monitor treatment for MRSA infections.    Studies/Results: No results found.  Medications:  Prior to Admission:  Prescriptions Prior to Admission  Medication Sig Dispense Refill Last Dose  . acetaminophen (TYLENOL) 325 MG tablet Take 2 tablets (650 mg total) by mouth every 6 (six) hours as needed for mild pain or fever.   11/05/2016 at Unknown time  . amLODipine (NORVASC) 2.5 MG tablet Take  1 tablet (2.5 mg total) by mouth daily. 30 tablet 6 11/06/2016 at Unknown time  . aspirin EC 81 MG tablet Take 1 tablet (81 mg total) by mouth daily. 30 tablet 6 11/06/2016 at Unknown time  . atorvastatin (LIPITOR) 10 MG tablet Take 10 mg by mouth at bedtime.    11/05/2016 at Unknown time  . guaiFENesin (MUCINEX) 600 MG 12 hr tablet Take 600 mg by mouth 2 (two) times daily.   11/06/2016 at Unknown time  . ipratropium-albuterol (DUONEB) 0.5-2.5 (3) MG/3ML SOLN Take 3 mLs by nebulization 4 (four) times daily.    11/06/2016 at Unknown time  . potassium chloride (K-DUR) 10 MEQ tablet Take 1 tablet (10 mEq total) by mouth 2 (two) times daily. (Patient taking differently: Take 10 mEq by mouth daily. ) 60 tablet 12 11/06/2016 at Unknown time  . sertraline (ZOLOFT) 50 MG tablet Take 50 mg by mouth daily.    11/06/2016 at Unknown time  . Vitamin D, Ergocalciferol, (DRISDOL) 50000 units CAPS capsule Take 50,000 Units by mouth every Thursday.   11/04/2016 at Unknown time   Scheduled: . aspirin EC  81 mg Oral Daily  . enoxaparin (LOVENOX) injection  30 mg Subcutaneous Q24H  . ipratropium-albuterol  3 mL Nebulization QID  . sertraline  50 mg Oral Daily   Continuous: . ceFEPime (MAXIPIME) IV Stopped (11/09/16 1441)  . dextrose 5 % and 0.2 % NaCl 75 mL/hr at 11/09/16 1800   KZS:[id number], albuterol, alum & mag hydroxide-simeth, guaiFENesin-dextromethorphan  Assesment: She was admitted with altered mental status which I think is metabolic encephalopathy from her urinary tract infection. At baseline she has multiple other medical problems including chronic hypoxic respiratory failure also with hypercapnia she was volume depleted she has chronic systolic heart failure chronic kidney disease coronary artery disease and mental retardation. She has improved substantially from her encephalopathy and she is much better in general. Her hydration is better. She's ready for transfer back to skilled care facility Principal  Problem:   Altered mental status Active Problems:   COPD (chronic obstructive pulmonary disease) (HCC)   Hypertension   Mental retardation, idiopathic mild   CKD (chronic kidney disease), stage III   CAD (coronary artery disease)   Chronic systolic CHF (congestive heart failure) (HCC)   Chronic respiratory failure (HCC)   Acute on chronic respiratory failure with hypercapnia (HCC)   Pyuria   Acute lower UTI   Volume depletion   SIRS (systemic inflammatory response syndrome) ([facility])    Plan: Transfer back to skilled care facility    LOS: 3 days   Delmus Warwick L 11/10/2016, 8:11 AM

## 2016-11-11 DIAGNOSIS — I2510 Atherosclerotic heart disease of native coronary artery without angina pectoris: Secondary | ICD-10-CM | POA: Diagnosis not present

## 2016-11-11 DIAGNOSIS — M25569 Pain in unspecified knee: Secondary | ICD-10-CM | POA: Diagnosis not present

## 2016-11-11 DIAGNOSIS — N390 Urinary tract infection, site not specified: Secondary | ICD-10-CM | POA: Diagnosis not present

## 2016-11-11 DIAGNOSIS — M25469 Effusion, unspecified knee: Secondary | ICD-10-CM | POA: Diagnosis not present

## 2016-11-12 LAB — CULTURE, BLOOD (ROUTINE X 2)
Culture: NO GROWTH
Culture: NO GROWTH
SPECIAL REQUESTS: ADEQUATE
SPECIAL REQUESTS: ADEQUATE

## 2016-11-18 DIAGNOSIS — D649 Anemia, unspecified: Secondary | ICD-10-CM | POA: Diagnosis not present

## 2016-11-18 DIAGNOSIS — N189 Chronic kidney disease, unspecified: Secondary | ICD-10-CM | POA: Diagnosis not present

## 2016-11-18 DIAGNOSIS — S90811S Abrasion, right foot, sequela: Secondary | ICD-10-CM | POA: Diagnosis not present

## 2016-11-23 ENCOUNTER — Other Ambulatory Visit (HOSPITAL_COMMUNITY): Payer: Self-pay | Admitting: Internal Medicine

## 2016-11-23 DIAGNOSIS — Z1231 Encounter for screening mammogram for malignant neoplasm of breast: Secondary | ICD-10-CM

## 2016-11-25 DIAGNOSIS — M25562 Pain in left knee: Secondary | ICD-10-CM | POA: Diagnosis not present

## 2016-11-25 DIAGNOSIS — M25561 Pain in right knee: Secondary | ICD-10-CM | POA: Diagnosis not present

## 2016-11-25 DIAGNOSIS — M1711 Unilateral primary osteoarthritis, right knee: Secondary | ICD-10-CM | POA: Diagnosis not present

## 2016-12-06 DIAGNOSIS — N189 Chronic kidney disease, unspecified: Secondary | ICD-10-CM | POA: Diagnosis not present

## 2016-12-06 DIAGNOSIS — D649 Anemia, unspecified: Secondary | ICD-10-CM | POA: Diagnosis not present

## 2016-12-06 DIAGNOSIS — I10 Essential (primary) hypertension: Secondary | ICD-10-CM | POA: Diagnosis not present

## 2016-12-06 DIAGNOSIS — E440 Moderate protein-calorie malnutrition: Secondary | ICD-10-CM | POA: Diagnosis not present

## 2016-12-13 DIAGNOSIS — Z79899 Other long term (current) drug therapy: Secondary | ICD-10-CM | POA: Diagnosis not present

## 2016-12-13 DIAGNOSIS — D649 Anemia, unspecified: Secondary | ICD-10-CM | POA: Diagnosis not present

## 2016-12-27 DIAGNOSIS — D649 Anemia, unspecified: Secondary | ICD-10-CM | POA: Diagnosis not present

## 2016-12-27 DIAGNOSIS — N189 Chronic kidney disease, unspecified: Secondary | ICD-10-CM | POA: Diagnosis not present

## 2016-12-27 DIAGNOSIS — E162 Hypoglycemia, unspecified: Secondary | ICD-10-CM | POA: Diagnosis not present

## 2016-12-27 DIAGNOSIS — Z79899 Other long term (current) drug therapy: Secondary | ICD-10-CM | POA: Diagnosis not present

## 2017-01-03 DIAGNOSIS — D649 Anemia, unspecified: Secondary | ICD-10-CM | POA: Diagnosis not present

## 2017-01-03 DIAGNOSIS — Z79899 Other long term (current) drug therapy: Secondary | ICD-10-CM | POA: Diagnosis not present

## 2017-01-03 DIAGNOSIS — R6889 Other general symptoms and signs: Secondary | ICD-10-CM | POA: Diagnosis not present

## 2017-01-05 ENCOUNTER — Ambulatory Visit (HOSPITAL_COMMUNITY)
Admission: RE | Admit: 2017-01-05 | Discharge: 2017-01-05 | Disposition: A | Discharge status: Home / Self Care | Payer: Medicare Other | Source: Ambulatory Visit | Attending: Internal Medicine | Admitting: Internal Medicine

## 2017-01-05 DIAGNOSIS — Z1231 Encounter for screening mammogram for malignant neoplasm of breast: Secondary | ICD-10-CM | POA: Diagnosis not present

## 2017-01-10 DIAGNOSIS — Z79899 Other long term (current) drug therapy: Secondary | ICD-10-CM | POA: Diagnosis not present

## 2017-01-10 DIAGNOSIS — D649 Anemia, unspecified: Secondary | ICD-10-CM | POA: Diagnosis not present

## 2017-01-10 DIAGNOSIS — R6889 Other general symptoms and signs: Secondary | ICD-10-CM | POA: Diagnosis not present

## 2017-01-17 DIAGNOSIS — D649 Anemia, unspecified: Secondary | ICD-10-CM | POA: Diagnosis not present

## 2017-01-17 DIAGNOSIS — Z79899 Other long term (current) drug therapy: Secondary | ICD-10-CM | POA: Diagnosis not present

## 2017-01-20 ENCOUNTER — Other Ambulatory Visit (HOSPITAL_COMMUNITY): Payer: Self-pay | Admitting: Nurse Practitioner

## 2017-01-20 DIAGNOSIS — N6310 Unspecified lump in the right breast, unspecified quadrant: Secondary | ICD-10-CM

## 2017-01-25 ENCOUNTER — Ambulatory Visit (HOSPITAL_COMMUNITY)
Admission: RE | Admit: 2017-01-25 | Discharge: 2017-01-25 | Disposition: A | Discharge status: Home / Self Care | Payer: Medicare Other | Source: Ambulatory Visit | Attending: Nurse Practitioner | Admitting: Nurse Practitioner

## 2017-01-25 DIAGNOSIS — N6310 Unspecified lump in the right breast, unspecified quadrant: Secondary | ICD-10-CM

## 2017-01-25 DIAGNOSIS — R928 Other abnormal and inconclusive findings on diagnostic imaging of breast: Secondary | ICD-10-CM | POA: Diagnosis not present

## 2017-01-25 DIAGNOSIS — N6489 Other specified disorders of breast: Secondary | ICD-10-CM | POA: Diagnosis not present

## 2017-01-31 DIAGNOSIS — D649 Anemia, unspecified: Secondary | ICD-10-CM | POA: Diagnosis not present

## 2017-01-31 DIAGNOSIS — R6889 Other general symptoms and signs: Secondary | ICD-10-CM | POA: Diagnosis not present

## 2017-01-31 DIAGNOSIS — Z79899 Other long term (current) drug therapy: Secondary | ICD-10-CM | POA: Diagnosis not present

## 2017-02-07 DIAGNOSIS — R6889 Other general symptoms and signs: Secondary | ICD-10-CM | POA: Diagnosis not present

## 2017-02-07 DIAGNOSIS — Z79899 Other long term (current) drug therapy: Secondary | ICD-10-CM | POA: Diagnosis not present

## 2017-02-07 DIAGNOSIS — D649 Anemia, unspecified: Secondary | ICD-10-CM | POA: Diagnosis not present

## 2017-02-14 DIAGNOSIS — R6889 Other general symptoms and signs: Secondary | ICD-10-CM | POA: Diagnosis not present

## 2017-02-14 DIAGNOSIS — Z79899 Other long term (current) drug therapy: Secondary | ICD-10-CM | POA: Diagnosis not present

## 2017-02-14 DIAGNOSIS — D649 Anemia, unspecified: Secondary | ICD-10-CM | POA: Diagnosis not present

## 2017-02-17 DIAGNOSIS — I2510 Atherosclerotic heart disease of native coronary artery without angina pectoris: Secondary | ICD-10-CM | POA: Diagnosis not present

## 2017-02-17 DIAGNOSIS — M1990 Unspecified osteoarthritis, unspecified site: Secondary | ICD-10-CM | POA: Diagnosis not present

## 2017-02-17 DIAGNOSIS — E785 Hyperlipidemia, unspecified: Secondary | ICD-10-CM | POA: Diagnosis not present

## 2017-02-21 DIAGNOSIS — D649 Anemia, unspecified: Secondary | ICD-10-CM | POA: Diagnosis not present

## 2017-02-21 DIAGNOSIS — R6889 Other general symptoms and signs: Secondary | ICD-10-CM | POA: Diagnosis not present

## 2017-02-21 DIAGNOSIS — Z79899 Other long term (current) drug therapy: Secondary | ICD-10-CM | POA: Diagnosis not present

## 2017-02-28 DIAGNOSIS — Z79899 Other long term (current) drug therapy: Secondary | ICD-10-CM | POA: Diagnosis not present

## 2017-02-28 DIAGNOSIS — D649 Anemia, unspecified: Secondary | ICD-10-CM | POA: Diagnosis not present

## 2017-03-01 DIAGNOSIS — R7989 Other specified abnormal findings of blood chemistry: Secondary | ICD-10-CM | POA: Diagnosis not present

## 2017-03-07 DIAGNOSIS — Z79899 Other long term (current) drug therapy: Secondary | ICD-10-CM | POA: Diagnosis not present

## 2017-03-07 DIAGNOSIS — R6889 Other general symptoms and signs: Secondary | ICD-10-CM | POA: Diagnosis not present

## 2017-03-07 DIAGNOSIS — D649 Anemia, unspecified: Secondary | ICD-10-CM | POA: Diagnosis not present

## 2017-03-08 DIAGNOSIS — R7989 Other specified abnormal findings of blood chemistry: Secondary | ICD-10-CM | POA: Diagnosis not present

## 2017-03-09 DIAGNOSIS — D649 Anemia, unspecified: Secondary | ICD-10-CM | POA: Diagnosis not present

## 2017-03-09 DIAGNOSIS — Z79899 Other long term (current) drug therapy: Secondary | ICD-10-CM | POA: Diagnosis not present

## 2017-03-14 DIAGNOSIS — Z79899 Other long term (current) drug therapy: Secondary | ICD-10-CM | POA: Diagnosis not present

## 2017-03-14 DIAGNOSIS — R6889 Other general symptoms and signs: Secondary | ICD-10-CM | POA: Diagnosis not present

## 2017-03-14 DIAGNOSIS — D649 Anemia, unspecified: Secondary | ICD-10-CM | POA: Diagnosis not present

## 2017-03-21 DIAGNOSIS — R6889 Other general symptoms and signs: Secondary | ICD-10-CM | POA: Diagnosis not present

## 2017-03-21 DIAGNOSIS — D649 Anemia, unspecified: Secondary | ICD-10-CM | POA: Diagnosis not present

## 2017-03-21 DIAGNOSIS — Z79899 Other long term (current) drug therapy: Secondary | ICD-10-CM | POA: Diagnosis not present

## 2017-03-28 DIAGNOSIS — D649 Anemia, unspecified: Secondary | ICD-10-CM | POA: Diagnosis not present

## 2017-03-28 DIAGNOSIS — Z5181 Encounter for therapeutic drug level monitoring: Secondary | ICD-10-CM | POA: Diagnosis not present

## 2017-03-30 DIAGNOSIS — N183 Chronic kidney disease, stage 3 (moderate): Secondary | ICD-10-CM | POA: Diagnosis not present

## 2017-03-30 DIAGNOSIS — Z5181 Encounter for therapeutic drug level monitoring: Secondary | ICD-10-CM | POA: Diagnosis not present

## 2017-03-30 DIAGNOSIS — D649 Anemia, unspecified: Secondary | ICD-10-CM | POA: Diagnosis not present

## 2017-03-30 DIAGNOSIS — J441 Chronic obstructive pulmonary disease with (acute) exacerbation: Secondary | ICD-10-CM | POA: Diagnosis not present

## 2017-04-04 DIAGNOSIS — J441 Chronic obstructive pulmonary disease with (acute) exacerbation: Secondary | ICD-10-CM | POA: Diagnosis not present

## 2017-04-04 DIAGNOSIS — N183 Chronic kidney disease, stage 3 (moderate): Secondary | ICD-10-CM | POA: Diagnosis not present

## 2017-04-04 DIAGNOSIS — D649 Anemia, unspecified: Secondary | ICD-10-CM | POA: Diagnosis not present

## 2017-04-04 DIAGNOSIS — Z5181 Encounter for therapeutic drug level monitoring: Secondary | ICD-10-CM | POA: Diagnosis not present

## 2017-04-06 DIAGNOSIS — D649 Anemia, unspecified: Secondary | ICD-10-CM | POA: Diagnosis not present

## 2017-04-06 DIAGNOSIS — D72829 Elevated white blood cell count, unspecified: Secondary | ICD-10-CM | POA: Diagnosis not present

## 2017-04-06 DIAGNOSIS — R0602 Shortness of breath: Secondary | ICD-10-CM | POA: Diagnosis not present

## 2017-04-11 DIAGNOSIS — E119 Type 2 diabetes mellitus without complications: Secondary | ICD-10-CM | POA: Diagnosis not present

## 2017-04-11 DIAGNOSIS — D649 Anemia, unspecified: Secondary | ICD-10-CM | POA: Diagnosis not present

## 2017-04-11 DIAGNOSIS — J449 Chronic obstructive pulmonary disease, unspecified: Secondary | ICD-10-CM | POA: Diagnosis not present

## 2017-04-11 DIAGNOSIS — Z79899 Other long term (current) drug therapy: Secondary | ICD-10-CM | POA: Diagnosis not present

## 2017-04-19 DIAGNOSIS — Z5181 Encounter for therapeutic drug level monitoring: Secondary | ICD-10-CM | POA: Diagnosis not present

## 2017-04-19 DIAGNOSIS — D649 Anemia, unspecified: Secondary | ICD-10-CM | POA: Diagnosis not present

## 2017-04-26 DIAGNOSIS — D649 Anemia, unspecified: Secondary | ICD-10-CM | POA: Diagnosis not present

## 2017-04-26 DIAGNOSIS — Z5181 Encounter for therapeutic drug level monitoring: Secondary | ICD-10-CM | POA: Diagnosis not present

## 2017-05-02 DIAGNOSIS — Z5181 Encounter for therapeutic drug level monitoring: Secondary | ICD-10-CM | POA: Diagnosis not present

## 2017-05-02 DIAGNOSIS — Z79899 Other long term (current) drug therapy: Secondary | ICD-10-CM | POA: Diagnosis not present

## 2017-05-02 DIAGNOSIS — D649 Anemia, unspecified: Secondary | ICD-10-CM | POA: Diagnosis not present

## 2017-05-12 DIAGNOSIS — D649 Anemia, unspecified: Secondary | ICD-10-CM | POA: Diagnosis not present

## 2017-05-12 DIAGNOSIS — E119 Type 2 diabetes mellitus without complications: Secondary | ICD-10-CM | POA: Diagnosis not present

## 2017-05-12 DIAGNOSIS — I10 Essential (primary) hypertension: Secondary | ICD-10-CM | POA: Diagnosis not present

## 2017-05-12 DIAGNOSIS — Z79899 Other long term (current) drug therapy: Secondary | ICD-10-CM | POA: Diagnosis not present

## 2017-05-16 DIAGNOSIS — Z79899 Other long term (current) drug therapy: Secondary | ICD-10-CM | POA: Diagnosis not present

## 2017-05-16 DIAGNOSIS — D509 Iron deficiency anemia, unspecified: Secondary | ICD-10-CM | POA: Diagnosis not present

## 2017-05-16 DIAGNOSIS — D649 Anemia, unspecified: Secondary | ICD-10-CM | POA: Diagnosis not present

## 2017-05-23 DIAGNOSIS — D509 Iron deficiency anemia, unspecified: Secondary | ICD-10-CM | POA: Diagnosis not present

## 2017-05-23 DIAGNOSIS — D649 Anemia, unspecified: Secondary | ICD-10-CM | POA: Diagnosis not present

## 2017-05-30 DIAGNOSIS — D649 Anemia, unspecified: Secondary | ICD-10-CM | POA: Diagnosis not present

## 2017-05-30 DIAGNOSIS — D509 Iron deficiency anemia, unspecified: Secondary | ICD-10-CM | POA: Diagnosis not present

## 2017-05-31 DIAGNOSIS — J449 Chronic obstructive pulmonary disease, unspecified: Secondary | ICD-10-CM | POA: Diagnosis not present

## 2017-05-31 DIAGNOSIS — I2510 Atherosclerotic heart disease of native coronary artery without angina pectoris: Secondary | ICD-10-CM | POA: Diagnosis not present

## 2017-05-31 DIAGNOSIS — I4891 Unspecified atrial fibrillation: Secondary | ICD-10-CM | POA: Diagnosis not present

## 2017-06-06 DIAGNOSIS — D649 Anemia, unspecified: Secondary | ICD-10-CM | POA: Diagnosis not present

## 2017-06-06 DIAGNOSIS — D509 Iron deficiency anemia, unspecified: Secondary | ICD-10-CM | POA: Diagnosis not present

## 2017-07-01 DIAGNOSIS — D649 Anemia, unspecified: Secondary | ICD-10-CM | POA: Diagnosis not present

## 2017-07-01 DIAGNOSIS — Z79899 Other long term (current) drug therapy: Secondary | ICD-10-CM | POA: Diagnosis not present

## 2017-07-25 DIAGNOSIS — I10 Essential (primary) hypertension: Secondary | ICD-10-CM | POA: Diagnosis not present

## 2017-07-25 DIAGNOSIS — J449 Chronic obstructive pulmonary disease, unspecified: Secondary | ICD-10-CM | POA: Diagnosis not present

## 2017-12-04 ENCOUNTER — Other Ambulatory Visit: Payer: Self-pay

## 2017-12-04 ENCOUNTER — Encounter (HOSPITAL_COMMUNITY): Payer: Self-pay | Admitting: *Deleted

## 2017-12-04 ENCOUNTER — Inpatient Hospital Stay (HOSPITAL_COMMUNITY)
Admission: EM | Admit: 2017-12-04 | Discharge: 2017-12-15 | DRG: 871 | Disposition: E | Payer: Medicare Other | Attending: Internal Medicine | Admitting: Internal Medicine

## 2017-12-04 ENCOUNTER — Emergency Department (HOSPITAL_COMMUNITY): Payer: Medicare Other

## 2017-12-04 DIAGNOSIS — F329 Major depressive disorder, single episode, unspecified: Secondary | ICD-10-CM | POA: Diagnosis present

## 2017-12-04 DIAGNOSIS — Z681 Body mass index (BMI) 19 or less, adult: Secondary | ICD-10-CM

## 2017-12-04 DIAGNOSIS — J9611 Chronic respiratory failure with hypoxia: Secondary | ICD-10-CM | POA: Diagnosis present

## 2017-12-04 DIAGNOSIS — Z9981 Dependence on supplemental oxygen: Secondary | ICD-10-CM

## 2017-12-04 DIAGNOSIS — R17 Unspecified jaundice: Secondary | ICD-10-CM | POA: Diagnosis present

## 2017-12-04 DIAGNOSIS — F70 Mild intellectual disabilities: Secondary | ICD-10-CM | POA: Diagnosis present

## 2017-12-04 DIAGNOSIS — F17210 Nicotine dependence, cigarettes, uncomplicated: Secondary | ICD-10-CM | POA: Diagnosis present

## 2017-12-04 DIAGNOSIS — A419 Sepsis, unspecified organism: Principal | ICD-10-CM

## 2017-12-04 DIAGNOSIS — I252 Old myocardial infarction: Secondary | ICD-10-CM

## 2017-12-04 DIAGNOSIS — N183 Chronic kidney disease, stage 3 unspecified: Secondary | ICD-10-CM | POA: Diagnosis present

## 2017-12-04 DIAGNOSIS — Z8249 Family history of ischemic heart disease and other diseases of the circulatory system: Secondary | ICD-10-CM

## 2017-12-04 DIAGNOSIS — Z66 Do not resuscitate: Secondary | ICD-10-CM | POA: Diagnosis present

## 2017-12-04 DIAGNOSIS — I130 Hypertensive heart and chronic kidney disease with heart failure and stage 1 through stage 4 chronic kidney disease, or unspecified chronic kidney disease: Secondary | ICD-10-CM | POA: Diagnosis present

## 2017-12-04 DIAGNOSIS — K668 Other specified disorders of peritoneum: Secondary | ICD-10-CM

## 2017-12-04 DIAGNOSIS — D72829 Elevated white blood cell count, unspecified: Secondary | ICD-10-CM

## 2017-12-04 DIAGNOSIS — K219 Gastro-esophageal reflux disease without esophagitis: Secondary | ICD-10-CM | POA: Diagnosis present

## 2017-12-04 DIAGNOSIS — Z515 Encounter for palliative care: Secondary | ICD-10-CM

## 2017-12-04 DIAGNOSIS — I255 Ischemic cardiomyopathy: Secondary | ICD-10-CM | POA: Diagnosis present

## 2017-12-04 DIAGNOSIS — Z96661 Presence of right artificial ankle joint: Secondary | ICD-10-CM | POA: Diagnosis present

## 2017-12-04 DIAGNOSIS — D649 Anemia, unspecified: Secondary | ICD-10-CM | POA: Diagnosis present

## 2017-12-04 DIAGNOSIS — F79 Unspecified intellectual disabilities: Secondary | ICD-10-CM | POA: Diagnosis present

## 2017-12-04 DIAGNOSIS — M1990 Unspecified osteoarthritis, unspecified site: Secondary | ICD-10-CM | POA: Diagnosis present

## 2017-12-04 DIAGNOSIS — I5042 Chronic combined systolic (congestive) and diastolic (congestive) heart failure: Secondary | ICD-10-CM | POA: Diagnosis present

## 2017-12-04 DIAGNOSIS — I480 Paroxysmal atrial fibrillation: Secondary | ICD-10-CM | POA: Diagnosis present

## 2017-12-04 DIAGNOSIS — I2510 Atherosclerotic heart disease of native coronary artery without angina pectoris: Secondary | ICD-10-CM | POA: Diagnosis present

## 2017-12-04 DIAGNOSIS — Z7189 Other specified counseling: Secondary | ICD-10-CM

## 2017-12-04 DIAGNOSIS — R6520 Severe sepsis without septic shock: Secondary | ICD-10-CM | POA: Diagnosis present

## 2017-12-04 DIAGNOSIS — F419 Anxiety disorder, unspecified: Secondary | ICD-10-CM | POA: Diagnosis present

## 2017-12-04 DIAGNOSIS — Z90710 Acquired absence of both cervix and uterus: Secondary | ICD-10-CM

## 2017-12-04 DIAGNOSIS — Z7982 Long term (current) use of aspirin: Secondary | ICD-10-CM

## 2017-12-04 DIAGNOSIS — Z7951 Long term (current) use of inhaled steroids: Secondary | ICD-10-CM

## 2017-12-04 DIAGNOSIS — Z79899 Other long term (current) drug therapy: Secondary | ICD-10-CM

## 2017-12-04 DIAGNOSIS — R109 Unspecified abdominal pain: Secondary | ICD-10-CM | POA: Diagnosis not present

## 2017-12-04 DIAGNOSIS — N179 Acute kidney failure, unspecified: Secondary | ICD-10-CM | POA: Diagnosis present

## 2017-12-04 DIAGNOSIS — K631 Perforation of intestine (nontraumatic): Secondary | ICD-10-CM

## 2017-12-04 DIAGNOSIS — R64 Cachexia: Secondary | ICD-10-CM | POA: Diagnosis present

## 2017-12-04 DIAGNOSIS — R1032 Left lower quadrant pain: Secondary | ICD-10-CM

## 2017-12-04 DIAGNOSIS — J449 Chronic obstructive pulmonary disease, unspecified: Secondary | ICD-10-CM | POA: Diagnosis present

## 2017-12-04 DIAGNOSIS — Q211 Atrial septal defect: Secondary | ICD-10-CM

## 2017-12-04 DIAGNOSIS — N289 Disorder of kidney and ureter, unspecified: Secondary | ICD-10-CM

## 2017-12-04 LAB — I-STAT CG4 LACTIC ACID, ED: LACTIC ACID, VENOUS: 2.51 mmol/L — AB (ref 0.5–1.9)

## 2017-12-04 MED ORDER — PIPERACILLIN-TAZOBACTAM 3.375 G IVPB
3.3750 g | Freq: Once | INTRAVENOUS | Status: AC
  Administered 2017-12-05: 3.375 g via INTRAVENOUS
  Filled 2017-12-04: qty 50

## 2017-12-04 MED ORDER — VANCOMYCIN HCL 500 MG IV SOLR
500.0000 mg | Freq: Once | INTRAVENOUS | Status: AC
  Administered 2017-12-05: 500 mg via INTRAVENOUS
  Filled 2017-12-04: qty 500

## 2017-12-04 MED ORDER — ONDANSETRON HCL 4 MG/2ML IJ SOLN
4.0000 mg | Freq: Once | INTRAMUSCULAR | Status: AC
  Administered 2017-12-04: 4 mg via INTRAVENOUS
  Filled 2017-12-04: qty 2

## 2017-12-04 NOTE — ED Triage Notes (Addendum)
Pt brought in by [facility] for c/o abdominal pain; pt is jaundice in color; pt given phenergan IM prior to ems arrival with relief of vomiting; pt c/o left side abdominal pain; pt received 8108ml of D5% 0.45% NS given by facility prior to arrival

## 2017-12-04 NOTE — ED Notes (Signed)
Date and time results received: 12/02/2017 @23 :28   Test: lactic acid 2.51 Critical Value:   Name of Provider Notified: Almyra Free PA  Orders Received? Or Actions Taken?:  No additional orders given,

## 2017-12-04 NOTE — ED Provider Notes (Signed)
[facility] EMERGENCY DEPARTMENT Provider Note   CSN: [id number] Arrival date & time: 12/07/2017  2217     History   Chief Complaint Chief Complaint  Patient presents with  . Abdominal Pain    HPI Alexis Dillon is a 72 y.o. 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 fib, MR, presenting from her local nursing home with a 2-day history of abdominal pain with nausea and emesis, low grade fevers,  no diarrhea.  She has been treated at the nursing home with IV fluids and Phenergan which has relieved her vomiting.  She has persistent and worsening pain and became tachycardic and hypotensive and was found to have increased confusion while at the nursing home and was sent here for further evaluation.  Her her PCPs instructions, there is concern for possible hepatitis or pancreatitis as they have noted some mild jaundice developing.  Level 5 caveat given patient's baseline of MR and current condition.    The history is provided by the nursing home. The history is limited by the condition of the patient.    Past Medical History:  Diagnosis Date  . Anemia   . Arthritis   . Atrophy of right kidney   . Barrett esophagus   . CAD (coronary artery disease) 04/2013   a. cath 04/2013 showed RCA, LCx occlusion, 50% mid-LAD stenosis, 80% distal stenosis, and first diagonal branch w/ 75% ostial stenosis. previously been evaluated for CABG, however d/t renal insufficiency and mild MR, the decision was previously made to manage her medically.  . Chronic respiratory failure ([place])   . Chronic systolic CHF (congestive heart failure) ([place])   . CKD (chronic kidney disease), stage III ([place])   . COPD (chronic obstructive pulmonary disease) ([place])   . Diverticulosis   . GERD (gastroesophageal reflux disease)   . GI bleed    a.  GIB/symptomatic anemia Hgb 6 in  03/2014 (colonoscopy - small bowel AVMs/erosions).  . Hiatal hernia   . History of atrial septal defect    Closure in 50's   . Hyperkalemia   . Hypertension   . Ischemic cardiomyopathy    a. 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  . Mental retardation, idiopathic mild   . NSTEMI (non-ST elevated myocardial infarction) ([place])   . PAF (paroxysmal atrial fibrillation) ([place]) 04/2013   a. s/p prior TEE/DCCV. b. Eliquis stopped after GIB/anemia in 03/2014.  Marland Kitchen Rat bite    a. Tx with rabies vaccines.  . Tobacco abuse     Patient Active Problem List   Diagnosis Date Noted  . SIRS (systemic inflammatory response syndrome) ([place]) 11/09/2016  . Altered mental status 11/07/2016  . Pyuria 11/07/2016  . Acute lower UTI 11/07/2016  . Volume depletion 11/07/2016  . Acute on chronic respiratory failure with hypercapnia ([place]) 09/23/2014  . Acute on chronic respiratory failure ([place]) 09/23/2014  . Bilateral carotid bruits 07/31/2014  . Chronic combined systolic and diastolic CHF (congestive heart failure) ([place]) 07/18/2014  . Animal bite of hand 07/18/2014  . Chronic respiratory failure ([place]) 07/18/2014  . Anemia 07/18/2014  . Unstable angina ([place]) 07/15/2014  . Ischemic cardiomyopathy   . Chronic systolic CHF (congestive heart failure) ([place])   . CKD (chronic kidney disease) stage 4, GFR 15-29 ml/min (HCC)   . Hyperlipidemia   . CKD (chronic kidney disease),  stage III ([place]) 05/05/2013  . Acute coronary syndrome ([place]) 05/03/2013  . COPD (chronic obstructive pulmonary disease) ([place])   . Hypertension   . History of atrial septal defect   . Mental retardation, idiopathic mild   . CAD (coronary artery disease) 04/16/2013  . PAF (paroxysmal atrial fibrillation) ([place]) 04/16/2013    Past Surgical History:  Procedure Laterality Date  . AGILE CAPSULE N/A 04/05/2014   Procedure: AGILE CAPSULE;  Surgeon: Danie Binder, MD;  Location: [facility] END[facility];  Service: Endoscopy;   Laterality: N/A;  . ASD REPAIR     in her 5s  . CARDIAC CATHETERIZATION  04/2013   Left mainstem: LM calcified with long 30% stenosis. Left anterior descending (LAD):  The LAD is large and wraps the apex.  The mid vessel has 50% stenosis followed by a focal 80% stenosis.  D1 small with ostial 75% stenosis.Left anterior descending (LAD):  The LAD is large and wraps the apex.  The mid vessel has 50% stenosis followed by a focal 80% stenosis.  D1 small with ostial 75% stenosis.     Marland Kitchen CARDIOVERSION N/A 05/08/2013   Procedure: CARDIOVERSION;  Surgeon: Lelon Perla, MD;  Location: [facility] ENDOSCOPY;  Service: Cardiovascular;  Laterality: N/A;  Dr. Zoila Shutter verified with EP pt in a flutter...will cardiovert...  150 joules @ 4:40, using Propofol 40  mg/IV .Marland Kitchen.successful NSR   . COLONOSCOPY N/A 10/24/2013   Dr. Gala Romney: 3 polyps, one removed piecemeal (tubulovillous adenoma). Next TCS 04/2014  . COLONOSCOPY N/A 04/10/2014   Procedure: COLONOSCOPY;  Surgeon: Daneil Dolin, MD;  Location: [facility]O SU[facility];  Service: Endoscopy;  Laterality: N/A;  . ESOPHAGOGASTRODUODENOSCOPY N/A 10/24/2013   Dr. Rourk:Barrett's without dysplasia. Next EGD 10/2014  . GIVENS CAPSULE STUDY N/A 04/17/2014   Procedure: GIVENS CAPSULE STUDY;  Surgeon: Daneil Dolin, MD;  Location: [facility] ENDO [facility];  Service: Endoscopy;  Laterality: N/A;  . LEFT AND RIGHT HEART CATHETERIZATION WITH CORONARY ANGIOGRAM N/A 05/04/2013   Procedure: LEFT AND RIGHT HEART CATHETERIZATION WITH CORONARY ANGIOGRAM;  Surgeon: Minus Breeding, MD;  Location: [facility] CATH LAB;  Service: Cardiovascular;  Laterality: N/A;  . TEE WITHOUT CARDIOVERSION N/A 05/08/2013   Procedure: TRANSESOPHAGEAL ECHOCARDIOGRAM (TEE);  Surgeon: Lelon Perla, MD;  Location: [facility] ENDOSCOPY;  Service: Cardiovascular;  Laterality: N/A;  . TONSILLECTOMY    . TOTAL ABDOMINAL HYSTERECTOMY     in the 9s  . TOTAL HIP ARTHROPLASTY Right    in her 68s     OB History   None      Home  Medications    Prior to Admission medications   Medication Sig Start Date End Date Taking? Authorizing Provider  acetaminophen (TYLENOL) 325 MG tablet Take 2 tablets (650 mg total) by mouth every 6 (six) hours as needed for mild pain or fever. Patient taking differently: Take 650 mg by mouth 3 (three) times daily.  05/11/13  Yes Hongalgi, Lenis Dickinson, MD  amLODipine (NORVASC) 2.5 MG tablet Take 1 tablet (2.5 mg total) by mouth daily. 07/18/14  Yes Dunn, Nedra Hai, PA-C  aspirin EC 81 MG tablet Take 1 tablet (81 mg total) by mouth daily. 07/18/14  Yes Dunn, Dayna N, PA-C  atorvastatin (LIPITOR) 10 MG tablet Take 10 mg by mouth at bedtime.    Yes [provider]  budesonide-formoterol (SYMBICORT) 160-4.5 MCG/ACT inhaler Inhale 2 puffs into the lungs 2 (two) times daily.   Yes [provider]  guaiFENesin (MUCINEX) 600 MG 12 hr tablet Take 600 mg  by mouth 2 (two) times daily.   Yes [provider]  ipratropium-albuterol (DUONEB) 0.5-2.5 (3) MG/3ML SOLN Take 3 mLs by nebulization every 6 (six) hours as needed (SHORTNESS OF BREATH).    Yes [provider]  OXYGEN Inhale 2 L into the lungs daily.   Yes [provider]  promethazine (PHENERGAN) 25 MG/ML injection Inject 25 mg into the vein once.   Yes [provider]  ranitidine (ZANTAC) 150 MG capsule Take 150 mg by mouth 2 (two) times daily.   Yes [provider]  sertraline (ZOLOFT) 50 MG tablet Take 50 mg by mouth daily.  10/17/14  Yes [provider]  tiotropium (SPIRIVA) 18 MCG inhalation capsule Place 18 mcg into inhaler and inhale daily.   Yes [provider]    Family History Family History  Problem Relation Age of Onset  . Heart attack Brother   . Heart attack Brother   . Diabetes Mother   . Heart attack Father        Died at 20 from MI  . Heart attack Sister        stent at 28  . Heart attack Sister        stent at 29  . Cancer Brother        lung  . Cancer  Brother        lung   . Colon cancer Brother        deceased, diagnosed in early 71s    Social History Social History   Tobacco Use  . Smoking status: Current Every Day Smoker    Packs/day: 1.00    Years: 55.00    Pack years: 55.00    Types: Cigarettes    Start date: 02/08/1958  . Smokeless tobacco: Never Used  . Tobacco comment: stopped Dec 2014  Substance Use Topics  . Alcohol use: No    Alcohol/week: 0.0 oz  . Drug use: No     Allergies   Patient has no known allergies.   Review of Systems Review of Systems  Unable to perform ROS: Mental status change  Constitutional: Positive for fever.  Gastrointestinal: Positive for abdominal pain, nausea and vomiting. Negative for diarrhea.     Physical Exam Updated Vital Signs BP (!) 113/99   Pulse (!) 41   Temp (!) 97.5 F (36.4 C) (Oral)   Resp (!) 29   Wt 38.7 kg (85 lb 6 oz)   SpO2 (!) 86%   BMI 15.62 kg/m   Physical Exam  Constitutional: She appears well-developed and well-nourished.  Cachectic patient, appears stated age   HENT:  Head: Normocephalic and atraumatic.  Mouth/Throat: Mucous membranes are dry.  Eyes: Conjunctivae are normal. No scleral icterus.  Cardiovascular: Normal heart sounds and intact distal pulses. An irregularly irregular rhythm present. Tachycardia present.  Pulmonary/Chest: Effort normal and breath sounds normal. No stridor. She has no wheezes. She has no rhonchi.  Abdominal: Soft. Bowel sounds are normal. There is tenderness in the left lower quadrant. There is guarding.  Musculoskeletal: Normal range of motion.  Neurological: She is alert.  Skin: Skin is warm and dry.  Psychiatric: She has a normal mood and affect.  Nursing note and vitals reviewed.    ED Treatments / Results  Labs (all labs ordered are listed, but only abnormal results are displayed) Labs Reviewed  URINALYSIS, ROUTINE W REFLEX MICROSCOPIC - Abnormal; Notable for the following components:      Result Value     Color,  Urine AMBER (*)    APPearance HAZY (*)    Hgb urine dipstick MODERATE (*)    Bilirubin Urine SMALL (*)    Protein, ur 30 (*)    All other components within normal limits  COMPREHENSIVE METABOLIC PANEL - Abnormal; Notable for the following components:   Sodium 133 (*)    CO2 19 (*)    BUN 50 (*)    Creatinine, Ser 2.33 (*)    Calcium 8.0 (*)    Total Protein 6.1 (*)    Albumin 2.5 (*)    GFR calc non Af Amer 20 (*)    GFR calc Af Amer 23 (*)    All other components within normal limits  CBC WITH DIFFERENTIAL/PLATELET - Abnormal; Notable for the following components:   WBC 16.1 (*)    Monocytes Absolute 1.3 (*)    All other components within normal limits  I-STAT CG4 LACTIC ACID, ED - Abnormal; Notable for the following components:   Lactic Acid, Venous 2.51 (*)    All other components within normal limits  CULTURE, BLOOD (ROUTINE X 2)  CULTURE, BLOOD (ROUTINE X 2)  LIPASE, BLOOD  I-STAT CG4 LACTIC ACID, ED  I-STAT CG4 LACTIC ACID, ED    EKG EKG Interpretation  Date/Time:  Sunday December 04 2017 22:32:08 EDT Ventricular Rate:  144 PR Interval:    QRS Duration: 109 QT Interval:  271 QTC Calculation: 420 R Axis:   99 Text Interpretation:  Atrial fibrillation Right axis deviation RSR' in V1 or V2, probably normal variant Repolarization abnormality, prob rate related No STEMI.  Confirmed by Nanda Quinton [phone]) on 11/24/2017 10:40:18 PM   Radiology Ct Abdomen Pelvis Wo Contrast  Result Date: 12/05/2017 CLINICAL DATA:  Abdominal pain, jaundice EXAM: CT ABDOMEN AND PELVIS WITHOUT CONTRAST TECHNIQUE: Multidetector CT imaging of the abdomen and pelvis was performed following the standard protocol without IV contrast. COMPARISON:  CT 09/30/2006, ultrasound abdomen 10/17/2013 FINDINGS: Lower chest: Lung bases demonstrate no pleural effusion. Partial consolidation in the posterior right lung base may reflect atelectasis. There is cardiomegaly. Coronary vascular calcification.  Small hiatal hernia. Hepatobiliary: No calcified gallstones. No focal hepatic abnormality or biliary enlargement Pancreas: Unremarkable. No pancreatic ductal dilatation or surrounding inflammatory changes. Spleen: Normal in size without focal abnormality. Adrenals/Urinary Tract: Right adrenal gland is normal. Stable 2 cm low-density mass left adrenal gland. Atrophic kidneys bilaterally. No hydronephrosis on the left. Small left renal calculi versus intrarenal vascular calcification. 4 mm stone mid right kidney. Moderate severe hydronephrosis of extrarenal pelvis as noted on prior study. The bladder contains a Foley catheter and air. Stomach/Bowel: The stomach is nonenlarged. Multiple loops of dilated fluid-filled small bowel. Focal wall thickening involving the distal transverse colon. Suspected abnormally thickened small bowel loops in the right lower quadrant/right hemipelvis, series 2, image number 51, series 5, image number 33. Significant stool impaction within the rectosigmoid colon. No colon wall thickening. Negative appendix. Vascular/Lymphatic: Extensive aortic atherosclerosis. No significantly enlarged lymph nodes. Reproductive: Status post hysterectomy. No adnexal masses. Other: Moderate to large amount of pneumoperitoneum. Small gas and fluid collection in the right posterior pelvis, measuring 5.1 cm. Musculoskeletal: Scoliosis and degenerative changes of the spine. Prior right hip replacement with normal alignment. IMPRESSION: 1. Large amount of pneumoperitoneum, consistent with hollow viscus perforation. Potential source in the right lower quadrant/upper pelvis where there is suspected abnormal small bowel thickening. Suspected extraluminal fluid and gas collection in the right posterior pelvis, near the abnormally thickened small bowel loops. Could consider ischemic  enteritis given significant degree of vascular disease present. 2. Dilated stool-filled colon and rectum, consistent with a fecal  impaction. No apparent sigmoid colon wall thickening. Small focal area of wall thickening at the distal transverse colon may reflect a small focus of colitis. 3. Atrophic kidneys bilaterally. 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 Stones are present within both kidneys. Critical Value/emergent results were called by telephone at the time of interpretation on 12/05/2017 at 2:38 am to Dr. Stark Jock, who verbally acknowledged these results. Electronically Signed   By: Donavan Foil M.D.   On: 12/05/2017 02:38   Dg Chest Port 1 View  Result Date: 11/25/2017 CLINICAL DATA:  Tachycardia EXAM: PORTABLE CHEST 1 VIEW COMPARISON:  11/07/2016 FINDINGS: Post sternotomy changes. Enlarged cardiomediastinal silhouette with enlarged central pulmonary arteries. No acute consolidation or effusion. Aortic atherosclerosis. No pneumothorax. IMPRESSION: 1. Mild cardiomegaly.  No edema or infiltrate 2. Prominent central pulmonary arteries, possible pulmonary hypertension. Electronically Signed   By: Donavan Foil M.D.   On: 11/16/2017 23:47    Procedures Procedures (including critical care time)  Medications Ordered in ED Medications  piperacillin-tazobactam (ZOSYN) IVPB 3.375 g (3.375 g Intravenous New Bag/Given 12/05/17 0017)  ondansetron (ZOFRAN) injection 4 mg (4 mg Intravenous Given 11/22/2017 2259)  vancomycin (VANCOCIN) 500 mg in sodium chloride 0.9 % 100 mL IVPB (500 mg Intravenous New Bag/Given 12/05/17 0056)     Initial Impression / Assessment and Plan / ED Course  I have reviewed the triage vital signs and the nursing notes.  Pertinent labs & imaging results that were available during my care of the patient were reviewed by me and considered in my medical decision making (see chart for details).     Pt with suspected small bowel perforation, possibly secondary to ischemic bowel.  She was treated with IV fluids, also  given vancomycin and zosyn. PAF which has been intermittently not rate controlled while here. Discussed with family and pt.   Also discussed with Dr. Stark Jock who assumes care of pt, pending discussion with general surgery and hospitalists for admission.    Final Clinical Impressions(s) / ED Diagnoses   Final diagnoses:  Small bowel perforation (HCC)  Paroxysmal atrial fibrillation (HCC)  Left lower quadrant pain  Leukocytosis, unspecified type  Renal insufficiency    ED Discharge Orders    None       Landis Martins 12/05/17 0250    Veryl Speak, MD 12/05/17 [phone]

## 2017-12-05 ENCOUNTER — Emergency Department (HOSPITAL_COMMUNITY): Payer: Medicare Other

## 2017-12-05 ENCOUNTER — Other Ambulatory Visit: Payer: Self-pay

## 2017-12-05 ENCOUNTER — Encounter (HOSPITAL_COMMUNITY): Payer: Self-pay | Admitting: Primary Care

## 2017-12-05 DIAGNOSIS — A419 Sepsis, unspecified organism: Principal | ICD-10-CM

## 2017-12-05 DIAGNOSIS — Z7189 Other specified counseling: Secondary | ICD-10-CM

## 2017-12-05 DIAGNOSIS — Z90710 Acquired absence of both cervix and uterus: Secondary | ICD-10-CM | POA: Diagnosis not present

## 2017-12-05 DIAGNOSIS — I5042 Chronic combined systolic (congestive) and diastolic (congestive) heart failure: Secondary | ICD-10-CM | POA: Diagnosis present

## 2017-12-05 DIAGNOSIS — Z515 Encounter for palliative care: Secondary | ICD-10-CM

## 2017-12-05 DIAGNOSIS — Q211 Atrial septal defect: Secondary | ICD-10-CM | POA: Diagnosis not present

## 2017-12-05 DIAGNOSIS — K668 Other specified disorders of peritoneum: Secondary | ICD-10-CM

## 2017-12-05 DIAGNOSIS — I2510 Atherosclerotic heart disease of native coronary artery without angina pectoris: Secondary | ICD-10-CM | POA: Diagnosis present

## 2017-12-05 DIAGNOSIS — K219 Gastro-esophageal reflux disease without esophagitis: Secondary | ICD-10-CM | POA: Diagnosis present

## 2017-12-05 DIAGNOSIS — N179 Acute kidney failure, unspecified: Secondary | ICD-10-CM | POA: Diagnosis present

## 2017-12-05 DIAGNOSIS — R17 Unspecified jaundice: Secondary | ICD-10-CM | POA: Diagnosis present

## 2017-12-05 DIAGNOSIS — R109 Unspecified abdominal pain: Secondary | ICD-10-CM | POA: Diagnosis present

## 2017-12-05 DIAGNOSIS — Z96661 Presence of right artificial ankle joint: Secondary | ICD-10-CM | POA: Diagnosis present

## 2017-12-05 DIAGNOSIS — Z66 Do not resuscitate: Secondary | ICD-10-CM | POA: Diagnosis present

## 2017-12-05 DIAGNOSIS — J449 Chronic obstructive pulmonary disease, unspecified: Secondary | ICD-10-CM | POA: Diagnosis present

## 2017-12-05 DIAGNOSIS — Z681 Body mass index (BMI) 19 or less, adult: Secondary | ICD-10-CM | POA: Diagnosis not present

## 2017-12-05 DIAGNOSIS — R6520 Severe sepsis without septic shock: Secondary | ICD-10-CM | POA: Diagnosis not present

## 2017-12-05 DIAGNOSIS — F70 Mild intellectual disabilities: Secondary | ICD-10-CM | POA: Diagnosis present

## 2017-12-05 DIAGNOSIS — I255 Ischemic cardiomyopathy: Secondary | ICD-10-CM | POA: Diagnosis present

## 2017-12-05 DIAGNOSIS — I480 Paroxysmal atrial fibrillation: Secondary | ICD-10-CM | POA: Diagnosis present

## 2017-12-05 DIAGNOSIS — R64 Cachexia: Secondary | ICD-10-CM | POA: Diagnosis present

## 2017-12-05 DIAGNOSIS — K631 Perforation of intestine (nontraumatic): Secondary | ICD-10-CM | POA: Diagnosis present

## 2017-12-05 DIAGNOSIS — N183 Chronic kidney disease, stage 3 (moderate): Secondary | ICD-10-CM | POA: Diagnosis present

## 2017-12-05 DIAGNOSIS — J9611 Chronic respiratory failure with hypoxia: Secondary | ICD-10-CM | POA: Diagnosis present

## 2017-12-05 DIAGNOSIS — I252 Old myocardial infarction: Secondary | ICD-10-CM | POA: Diagnosis not present

## 2017-12-05 DIAGNOSIS — I130 Hypertensive heart and chronic kidney disease with heart failure and stage 1 through stage 4 chronic kidney disease, or unspecified chronic kidney disease: Secondary | ICD-10-CM | POA: Diagnosis present

## 2017-12-05 LAB — CBC WITH DIFFERENTIAL/PLATELET
BASOS PCT: 0 %
Basophils Absolute: 0 10*3/uL (ref 0.0–0.1)
EOS ABS: 0 10*3/uL (ref 0.0–0.7)
Eosinophils Relative: 0 %
HCT: 43.1 % (ref 36.0–46.0)
Hemoglobin: 14.7 g/dL (ref 12.0–15.0)
LYMPHS ABS: 1.2 10*3/uL (ref 0.7–4.0)
Lymphocytes Relative: 7 %
MCH: 33 pg (ref 26.0–34.0)
MCHC: 34.1 g/dL (ref 30.0–36.0)
MCV: 96.6 fL (ref 78.0–100.0)
Monocytes Absolute: 1.3 10*3/uL — ABNORMAL HIGH (ref 0.1–1.0)
Monocytes Relative: 8 %
Neutro Abs: 13.6 10*3/uL (ref 1.7–7.7)
Neutrophils Relative %: 85 %
Platelets: 313 10*3/uL (ref 150–400)
RBC: 4.46 MIL/uL (ref 3.87–5.11)
RDW: 14 % (ref 11.5–15.5)
WBC: 16.1 10*3/uL — ABNORMAL HIGH (ref 4.0–10.5)

## 2017-12-05 LAB — COMPREHENSIVE METABOLIC PANEL
ALK PHOS: 71 U/L (ref 38–126)
ALT: 15 U/L (ref 0–44)
AST: 40 U/L (ref 15–41)
Albumin: 2.5 g/dL — ABNORMAL LOW (ref 3.5–5.0)
Anion gap: 14 (ref 5–15)
BUN: 50 mg/dL — ABNORMAL HIGH (ref 8–23)
CALCIUM: 8 mg/dL — AB (ref 8.9–10.3)
CO2: 19 mmol/L — AB (ref 22–32)
Chloride: 100 mmol/L (ref 98–111)
Creatinine, Ser: 2.33 mg/dL — ABNORMAL HIGH (ref 0.44–1.00)
GFR calc Af Amer: 23 mL/min — ABNORMAL LOW (ref >60)
GFR calc non Af Amer: 20 mL/min — ABNORMAL LOW (ref >60)
Glucose, Bld: 92 mg/dL (ref 70–99)
Potassium: 4.3 mmol/L (ref 3.5–5.1)
SODIUM: 133 mmol/L — AB (ref 135–145)
Total Bilirubin: 0.8 mg/dL (ref 0.3–1.2)
Total Protein: 6.1 g/dL — ABNORMAL LOW (ref 6.5–8.1)

## 2017-12-05 LAB — URINALYSIS, ROUTINE W REFLEX MICROSCOPIC
BACTERIA UA: NONE SEEN
Glucose, UA: NEGATIVE mg/dL
KETONES UR: NEGATIVE mg/dL
Leukocytes, UA: NEGATIVE
Nitrite: NEGATIVE
PROTEIN: 30 mg/dL — AB
Specific Gravity, Urine: 1.025 (ref 1.005–1.030)
pH: 5 (ref 5.0–8.0)

## 2017-12-05 LAB — LIPASE, BLOOD: Lipase: 18 U/L (ref 11–51)

## 2017-12-05 LAB — MRSA PCR SCREENING: MRSA BY PCR: NEGATIVE

## 2017-12-05 MED ORDER — MORPHINE SULFATE (CONCENTRATE) 10 MG/0.5ML PO SOLN
2.6000 mg | ORAL | Status: DC
  Administered 2017-12-05 – 2017-12-08 (×11): 2.6 mg via ORAL
  Filled 2017-12-05 (×11): qty 0.5

## 2017-12-05 MED ORDER — SODIUM CHLORIDE 0.9 % IV SOLN
250.0000 mL | INTRAVENOUS | Status: DC | PRN
Start: 2017-12-05 — End: 2017-12-08

## 2017-12-05 MED ORDER — ONDANSETRON HCL 4 MG PO TABS: 4.0000 mg | ORAL_TABLET | Freq: Four times a day (QID) | ORAL | Status: DC | PRN

## 2017-12-05 MED ORDER — LORAZEPAM 2 MG/ML IJ SOLN
1.0000 mg | INTRAMUSCULAR | Status: DC | PRN
  Administered 2017-12-05: 1 mg via INTRAVENOUS
  Filled 2017-12-05: qty 1

## 2017-12-05 MED ORDER — MORPHINE SULFATE (CONCENTRATE) 10 MG/0.5ML PO SOLN: 2.6000 mg | ORAL | Status: DC

## 2017-12-05 MED ORDER — SODIUM CHLORIDE 0.9% FLUSH
3.0000 mL | INTRAVENOUS | Status: DC | PRN
Start: 2017-12-05 — End: 2017-12-08

## 2017-12-05 MED ORDER — MORPHINE SULFATE (CONCENTRATE) 10 MG/0.5ML PO SOLN
2.6000 mg | ORAL | Status: DC | PRN
Start: 2017-12-05 — End: 2017-12-08
  Administered 2017-12-07 (×4): 2.6 mg via ORAL
  Filled 2017-12-05 (×4): qty 0.5

## 2017-12-05 MED ORDER — SODIUM CHLORIDE 0.9% FLUSH
3.0000 mL | Freq: Two times a day (BID) | INTRAVENOUS | Status: DC
  Administered 2017-12-05 – 2017-12-07 (×4): 3 mL via INTRAVENOUS

## 2017-12-05 MED ORDER — MORPHINE SULFATE (PF) 2 MG/ML IV SOLN
2.0000 mg | INTRAVENOUS | Status: DC | PRN
  Administered 2017-12-05: 2 mg via INTRAVENOUS
  Filled 2017-12-05: qty 1

## 2017-12-05 MED ORDER — POLYVINYL ALCOHOL 1.4 % OP SOLN
2.0000 [drp] | OPHTHALMIC | Status: DC | PRN
  Administered 2017-12-07: 2 [drp] via OPHTHALMIC
  Filled 2017-12-05: qty 15

## 2017-12-05 MED ORDER — ONDANSETRON HCL 4 MG/2ML IJ SOLN: 4.0000 mg | Freq: Four times a day (QID) | INTRAMUSCULAR | Status: DC | PRN

## 2017-12-05 MED ORDER — ENSURE ENLIVE PO LIQD: 237.0000 mL | Freq: Two times a day (BID) | ORAL | Status: DC

## 2017-12-05 MED ORDER — VANCOMYCIN HCL 500 MG IV SOLR
INTRAVENOUS | Status: AC
  Filled 2017-12-05: qty 500

## 2017-12-05 NOTE — Consult Note (Signed)
Consultation Note Date: 12/05/2017   Patient Name: Alexis Dillon  DOB: [birth year]  MRN: [id number]  Age / Sex: 72 y.o., female  PCP: Sinda Du, MD Referring Physician: Sinda Du, MD  Reason for Consultation: Establishing goals of care and Terminal Care  HPI/Patient Profile: 72 y.o. female  with past medical history of anemia, Barrett's esophagus, CAD with cath 2014, and STEMI, PAF, chronic CHF, stage III kidney disease, chronic respiratory failure, COPD, diverticulosis, GERD, mild mental retardation admitted on 12/01/2017 with severe sepsis secondary to bowel perforation with large pneumoperitoneum.   Clinical Assessment and Goals of Care: Mrs. Newmann is lying quietly in bed.  She does not make her keep eye contact, she appears relatively comfortable at this time.  Present today at bedside is her brother Gene, who shares that he has been told that his sister may only have a few days to live.  I shared that she is very sick, and we are doing her best to make sure she is kept comfortable.  We discussed pain and anxiety medications scheduled and as needed.  Gene is agreement for full comfort care, liberalization of medications.  We have discussed the benefits of residential hospice of [place] in detail including the benefits for symptom management and specialized care at end-of-life.  Brother Gene is unable to agree to residential hospice at this time, stating that he [place] 1 block from the hospital, and it would be easier for him to see his sister here.  About specialized medical care, comfort and dignity provided in residential hospice. Gene states that he was told that his sister would likely pass in a day or 2, and his concern is that she may be uncomfortable in moving.  I shared that if she were to be unstable, she would not be transferred to residential hospice, but could instead receive  hospice type care here at the hospital.  Conference with nursing staff related to plan of care, symptom management, encouragement for hospice residential.  Notice that Gene is requesting phone call.  Gene asks about the cost of residential hospice.  We briefly discussed finances related to residential hospice, and I encouraged Gene to speak with [facility] for details, he declines at this time.  Healthcare power of attorney NEXT OF KIN -brother Gene is main Media planner.   SUMMARY OF RECOMMENDATIONS   Full comfort care.  Code Status/Advance Care Planning:  DNR  Symptom Management:   Scheduled pain medication  Palliative Prophylaxis:   Frequent Pain Assessment and Turn Reposition  Additional Recommendations (Limitations, Scope, Preferences):  Full Comfort Care  Psycho-social/Spiritual:   Desire for further Chaplaincy support:no  Additional Recommendations: Caregiving  Support/Resources and Education on Hospice  Prognosis:   Hours - Days, 48 to 72 hours would not be surprising.  We have discussed the benefits of residential hospice at [facility] in detail including the benefits for symptom management and specialized care to end-of-life.  Brother Gene is unable to agree to residential hospice at this time.  Discharge Planning: Anticipate hospital death, encouraged  the benefits of residential hospice.      Primary Diagnoses: Present on Admission: . CAD (coronary artery disease) . Chronic combined systolic and diastolic CHF (congestive heart failure) ([place]) . COPD (chronic obstructive pulmonary disease) ([place]) . Idiopathic mild intellectual disability . CKD (chronic kidney disease), stage III ([place])   I have reviewed the medical record, interviewed the patient and family, and examined the patient. The following aspects are pertinent.  Past Medical History:  Diagnosis Date  . Anemia   . Arthritis   . Atrophy of right kidney   . Barrett esophagus   . CAD (coronary  artery disease) 04/2013   a. cath 04/2013 showed RCA, LCx occlusion, 50% mid-LAD stenosis, 80% distal stenosis, and first diagonal branch w/ 75% ostial stenosis. previously been evaluated for CABG, however d/t renal insufficiency and mild MR, the decision was previously made to manage her medically.  . Chronic respiratory failure ([place])   . Chronic systolic CHF (congestive heart failure) ([place])   . CKD (chronic kidney disease), stage III ([place])   . COPD (chronic obstructive pulmonary disease) ([place])   . Diverticulosis   . GERD (gastroesophageal reflux disease)   . GI bleed    a.  GIB/symptomatic anemia Hgb 6 in 03/2014 (colonoscopy - small bowel AVMs/erosions).  . Hiatal hernia   . History of atrial septal defect    Closure in 50's   . Hyperkalemia   . Hypertension   . Ischemic cardiomyopathy    a. 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  . Mental retardation, idiopathic mild   . NSTEMI (non-ST elevated myocardial infarction) ([place])   . PAF (paroxysmal atrial fibrillation) ([place]) 04/2013   a. s/p prior TEE/DCCV. b. Eliquis stopped after GIB/anemia in 03/2014.  Marland Kitchen Rat bite    a. Tx with rabies vaccines.  . Tobacco abuse    Social History   Socioeconomic History  . Marital status: Divorced    Spouse name: Not on file  . Number of children: Not on file  . Years of education: Not on file  . Highest education level: Not on file  Occupational History  . Not on file  Social Needs  . Financial resource strain: Not on file  . Food insecurity:    Worry: Not on file    Inability: Not on file  . Transportation needs:    Medical: Not on file    Non-medical: Not on file  Tobacco Use  . Smoking status: Current Every Day Smoker    Packs/day: 1.00    Years: 55.00    Pack years: 55.00    Types: Cigarettes    Start date: 02/08/1958  . Smokeless tobacco: Never Used  . Tobacco comment: stopped Dec 2014  Substance and Sexual Activity  . Alcohol use: No    Alcohol/week:  0.0 oz  . Drug use: No  . Sexual activity: Never  Lifestyle  . Physical activity:    Days per week: Not on file    Minutes per session: Not on file  . Stress: Not on file  Relationships  . Social connections:    Talks on phone: Not on file    Gets together: Not on file    Attends religious service: Not on file    Active member of club or organization: Not on file    Attends meetings of clubs or organizations: Not on file    Relationship status: Not on file  Other Topics Concern  . Not on  file  Social History Narrative   She has mild mental retardation. Patient not married and lives with her sister. Divorced with no children.    Family History  Problem Relation Age of Onset  . Heart attack Brother   . Heart attack Brother   . Diabetes Mother   . Heart attack Father        Died at 69 from MI  . Heart attack Sister        stent at 1  . Heart attack Sister        stent at 81  . Cancer Brother        lung  . Cancer Brother        lung   . Colon cancer Brother        deceased, diagnosed in early 33s   Scheduled Meds: . feeding supplement (ENSURE ENLIVE)  237 mL Oral BID BM  . morphine CONCENTRATE  2.6-5 mg Oral Q4H  . sodium chloride flush  3 mL Intravenous Q12H   Continuous Infusions: . sodium chloride     PRN Meds:.sodium chloride, LORazepam, morphine CONCENTRATE, ondansetron **OR** ondansetron (ZOFRAN) IV, sodium chloride flush Medications Prior to Admission:  Prior to Admission medications   Medication Sig Start Date End Date Taking? Authorizing Provider  acetaminophen (TYLENOL) 325 MG tablet Take 2 tablets (650 mg total) by mouth every 6 (six) hours as needed for mild pain or fever. Patient taking differently: Take 650 mg by mouth 3 (three) times daily.  05/11/13  Yes Hongalgi, Lenis Dickinson, MD  amLODipine (NORVASC) 2.5 MG tablet Take 1 tablet (2.5 mg total) by mouth daily. 07/18/14  Yes Dunn, Nedra Hai, PA-C  aspirin EC 81 MG tablet Take 1 tablet (81 mg total) by mouth  daily. 07/18/14  Yes Dunn, Dayna N, PA-C  atorvastatin (LIPITOR) 10 MG tablet Take 10 mg by mouth at bedtime.    Yes [provider]  budesonide-formoterol (SYMBICORT) 160-4.5 MCG/ACT inhaler Inhale 2 puffs into the lungs 2 (two) times daily.   Yes [provider]  guaiFENesin (MUCINEX) 600 MG 12 hr tablet Take 600 mg by mouth 2 (two) times daily.   Yes [provider]  ipratropium-albuterol (DUONEB) 0.5-2.5 (3) MG/3ML SOLN Take 3 mLs by nebulization every 6 (six) hours as needed (SHORTNESS OF BREATH).    Yes [provider]  OXYGEN Inhale 2 L into the lungs daily.   Yes [provider]  promethazine (PHENERGAN) 25 MG/ML injection Inject 25 mg into the vein once.   Yes [provider]  ranitidine (ZANTAC) 150 MG capsule Take 150 mg by mouth 2 (two) times daily.   Yes [provider]  sertraline (ZOLOFT) 50 MG tablet Take 50 mg by mouth daily.  10/17/14  Yes [provider]  tiotropium (SPIRIVA) 18 MCG inhalation capsule Place 18 mcg into inhaler and inhale daily.   Yes [provider]   No Known Allergies Review of Systems  Unable to perform ROS: Acuity of condition    Physical Exam  Constitutional: She appears ill.  Appears acutely ill, frail, does not make eye contact  Cardiovascular: Normal rate.  Pulmonary/Chest: Effort normal. No respiratory distress.  Abdominal: She exhibits no distension. There is tenderness.  Neurological: She is alert.  Eyes are open, does not answer questions.  Skin: Skin is warm and dry.  Nursing note and vitals reviewed.   Vital Signs: BP 104/68 (BP Location: Left Arm)   Pulse (!) 147   Temp 98 F (  36.7 C) (Axillary)   Resp (!) 22   Wt 39.7 kg (87 lb 8.4 oz)   SpO2 98%   BMI 16.01 kg/m  Pain Scale: PAINAD POSS *See Group Information*: S-Acceptable,Sleep, easy to arouse Pain Score: Asleep   SpO2: SpO2: 98 % O2 Device:SpO2: 98 % O2 Flow Rate: .O2 Flow Rate (L/min): 3  L/min  IO: Intake/output summary:   Intake/Output Summary (Last 24 hours) at 12/05/2017 1257 Last data filed at 12/05/2017 0900 Gross per 24 hour  Intake 100 ml  Output -  Net 100 ml    LBM:   Baseline Weight: Weight: 38.7 kg (85 lb 6 oz) Most recent weight: Weight: 39.7 kg (87 lb 8.4 oz)     Palliative Assessment/Data:   Flowsheet Rows     Most Recent Value  Intake Tab  Referral Department  Pulmonary  Unit at Time of Referral  Med/Surg Unit  Palliative Care Primary Diagnosis  Sepsis/Infectious Disease  Date Notified  12/05/17  Palliative Care Type  New Palliative care  Reason for referral  Counsel Regarding Hospice, Clarify Goals of Care, End of Life Care Assistance  Date of Admission  12/01/2017  Date first seen by Palliative Care  12/05/17  # of days Palliative referral response time  0 Day(s)  # of days IP prior to Palliative referral  1  Clinical Assessment  Palliative Performance Scale Score  20%  Pain Max last 24 hours  Not able to report  Pain Min Last 24 hours  Not able to report  Dyspnea Max Last 24 Hours  Not able to report  Dyspnea Min Last 24 hours  Not able to report  Psychosocial & Spiritual Assessment  Palliative Care Outcomes  Patient/Family meeting held?  Yes  Who was at the meeting?  Patient and brother Gene at bedside  Patient/Family wishes: Interventions discontinued/not started   Mechanical Ventilation, BiPAP, Hemodialysis, Antibiotics, Tube feedings/TPN      Time In: 0930 Time Out: 1025 Time Total: 55 minutes Greater than 50%  of this time was spent counseling and coordinating care related to the above assessment and plan.  Signed by: Drue Novel, NP   Please contact Palliative Medicine Team phone at [phone] for questions and concerns.  For individual provider: See Shea Evans

## 2017-12-05 NOTE — ED Notes (Signed)
ED Provider at bedside. 

## 2017-12-05 NOTE — Progress Notes (Signed)
Nutrition Brief Note  Chart reviewed. RD drawn to patient through Malnutrition Screen. Pt has transitioned to comfort care. Palliative Medicine Team member following. No further nutrition interventions warranted at this time.  Please re-consult as needed.   Colman Cater MS,RD,CSG,LDN Office: [phone] Pager: [phone]

## 2017-12-05 NOTE — ED Notes (Signed)
Comfort measures provided, pt reports that she is watching a movie and sees Alexis Dillon and Alexis Dillon,

## 2017-12-05 NOTE — H&P (Signed)
History and Physical    Alexis Dillon YCX:[id number] DOB: 03/24/1946 DOA: 11/24/2017  PCP: Sinda Du, MD   Patient coming from: [facility]  Chief Complaint: Abdominal pain with N/V  HPI: Alexis Dillon is a 72 y.o. female with medical history significant for severe CAD with ischemic cardiomyopathy/CHF, COPD, CKD stage III, paroxysmal atrial fibrillation, GERD, mental retardation, and prior GI bleeds for multiple small bowel AVMs who presented from her skilled nursing facility with worsening abdominal pain that began early yesterday with some associated nausea and vomiting as well as low-grade fevers.  She is not reported to have any diarrhea and was treated at the skilled nursing facility with IV fluids and Phenergan which helped her nausea, but she continued to have persistent and worsening pain.  She then became tachycardic and hypotensive with worsening confusion and was sent to the ED for further evaluation.  She was also noted to be developing some mild jaundice.  Patient cannot give any significant history on account of her condition with mental retardation.   ED Course: Vital signs with tachycardia demonstrating atrial fibrillation with RVR on EKG with rate of 144.  She has maintained some soft blood pressure readings while here and was given 1 dose of Zosyn and vancomycin.  ED physician had spoken with general surgery on-call who states that patient is too high risk for any surgical measure and would be best served by medical management.  Review of Systems: Cannot be obtained due to patient condition.  Past Medical History:  Diagnosis Date  . Anemia   . Arthritis   . Atrophy of right kidney   . Barrett esophagus   . CAD (coronary artery disease) 04/2013   a. cath 04/2013 showed RCA, LCx occlusion, 50% mid-LAD stenosis, 80% distal stenosis, and first diagonal branch w/ 75% ostial stenosis. previously been evaluated for CABG, however d/t renal insufficiency and mild MR, the decision  was previously made to manage her medically.  . Chronic respiratory failure ([place])   . Chronic systolic CHF (congestive heart failure) ([place])   . CKD (chronic kidney disease), stage III ([place])   . COPD (chronic obstructive pulmonary disease) ([place])   . Diverticulosis   . GERD (gastroesophageal reflux disease)   . GI bleed    a.  GIB/symptomatic anemia Hgb 6 in 03/2014 (colonoscopy - small bowel AVMs/erosions).  . Hiatal hernia   . History of atrial septal defect    Closure in 50's   . Hyperkalemia   . Hypertension   . Ischemic cardiomyopathy    a. 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  . Mental retardation, idiopathic mild   . NSTEMI (non-ST elevated myocardial infarction) ([place])   . PAF (paroxysmal atrial fibrillation) ([place]) 04/2013   a. s/p prior TEE/DCCV. b. Eliquis stopped after GIB/anemia in 03/2014.  Marland Kitchen Rat bite    a. Tx with rabies vaccines.  . Tobacco abuse     Past Surgical History:  Procedure Laterality Date  . AGILE CAPSULE N/A 04/05/2014   Procedure: AGILE CAPSULE;  Surgeon: Alexis Binder, MD;  Location: [facility];  Service: Endoscopy;  Laterality: N/A;  . ASD REPAIR     in her 35s  . CARDIAC CATHETERIZATION  04/2013   Left mainstem: LM calcified with long 30% stenosis. Left anterior descending (LAD):  The LAD is large and wraps the apex.  The mid vessel has 50% stenosis followed by a focal 80% stenosis.  D1 small with ostial 75% stenosis.Left  anterior descending (LAD):  The LAD is large and wraps the apex.  The mid vessel has 50% stenosis followed by a focal 80% stenosis.  D1 small with ostial 75% stenosis.     Marland Kitchen CARDIOVERSION N/A 05/08/2013   Procedure: CARDIOVERSION;  Surgeon: Alexis Perla, MD;  Location: [facility] ENDOSCOPY;  Service: Cardiovascular;  Laterality: N/A;  Dr. Zoila Shutter verified with EP pt in a flutter...will cardiovert...  150 joules @ 4:40, using Propofol 40  mg/IV .Marland Kitchen.successful NSR   . COLONOSCOPY N/A 10/24/2013   Dr. Gala Dillon: 3  polyps, one removed piecemeal (tubulovillous adenoma). Next TCS 04/2014  . COLONOSCOPY N/A 04/10/2014   Procedure: COLONOSCOPY;  Surgeon: Alexis Dolin, MD;  Location: [facility];  Service: Endoscopy;  Laterality: N/A;  . ESOPHAGOGASTRODUODENOSCOPY N/A 10/24/2013   Dr. Rourk:Barrett's without dysplasia. Next EGD 10/2014  . GIVENS CAPSULE STUDY N/A 04/17/2014   Procedure: GIVENS CAPSULE STUDY;  Surgeon: Alexis Dolin, MD;  Location: [facility];  Service: Endoscopy;  Laterality: N/A;  . LEFT AND RIGHT HEART CATHETERIZATION WITH CORONARY ANGIOGRAM N/A 05/04/2013   Procedure: LEFT AND RIGHT HEART CATHETERIZATION WITH CORONARY ANGIOGRAM;  Surgeon: Alexis Breeding, MD;  Location: [facility] CATH LAB;  Service: Cardiovascular;  Laterality: N/A;  . TEE WITHOUT CARDIOVERSION N/A 05/08/2013   Procedure: TRANSESOPHAGEAL ECHOCARDIOGRAM (TEE);  Surgeon: Alexis Perla, MD;  Location: [facility] ENDOSCOPY;  Service: Cardiovascular;  Laterality: N/A;  . TONSILLECTOMY    . TOTAL ABDOMINAL HYSTERECTOMY     in the 60s  . TOTAL HIP ARTHROPLASTY Right    in her 30s     reports that she has been smoking cigarettes.  She started smoking about 59 years ago. She has a 55.00 pack-year smoking history. She has never used smokeless tobacco. She reports that she does not drink alcohol or use drugs.  No Known Allergies  Family History  Problem Relation Age of Onset  . Heart attack Brother   . Heart attack Brother   . Diabetes Mother   . Heart attack Father        Died at 23 from MI  . Heart attack Sister        stent at 13  . Heart attack Sister        stent at 25  . Cancer Brother        lung  . Cancer Brother        lung   . Colon cancer Brother        deceased, diagnosed in early 47s    Prior to Admission medications   Medication Sig Start Date End Date Taking? Authorizing Provider  acetaminophen (TYLENOL) 325 MG tablet Take 2 tablets (650 mg total) by mouth every 6 (six) hours as needed for mild pain or  fever. Patient taking differently: Take 650 mg by mouth 3 (three) times daily.  05/11/13  Yes Hongalgi, Lenis Dickinson, MD  amLODipine (NORVASC) 2.5 MG tablet Take 1 tablet (2.5 mg total) by mouth daily. 07/18/14  Yes Dunn, Nedra Hai, PA-C  aspirin EC 81 MG tablet Take 1 tablet (81 mg total) by mouth daily. 07/18/14  Yes Dunn, Dayna N, PA-C  atorvastatin (LIPITOR) 10 MG tablet Take 10 mg by mouth at bedtime.    Yes [provider]  budesonide-formoterol (SYMBICORT) 160-4.5 MCG/ACT inhaler Inhale 2 puffs into the lungs 2 (two) times daily.   Yes [provider]  guaiFENesin (MUCINEX) 600 MG 12 hr tablet Take 600 mg by mouth 2 (two) times daily.  Yes [provider]  ipratropium-albuterol (DUONEB) 0.5-2.5 (3) MG/3ML SOLN Take 3 mLs by nebulization every 6 (six) hours as needed (SHORTNESS OF BREATH).    Yes [provider]  OXYGEN Inhale 2 L into the lungs daily.   Yes [provider]  promethazine (PHENERGAN) 25 MG/ML injection Inject 25 mg into the vein once.   Yes [provider]  ranitidine (ZANTAC) 150 MG capsule Take 150 mg by mouth 2 (two) times daily.   Yes [provider]  sertraline (ZOLOFT) 50 MG tablet Take 50 mg by mouth daily.  10/17/14  Yes [provider]  tiotropium (SPIRIVA) 18 MCG inhalation capsule Place 18 mcg into inhaler and inhale daily.   Yes [provider]    Physical Exam: Vitals:   12/05/17 0103 12/05/17 0130 12/05/17 0230 12/05/17 0300  BP:  112/79 98/68 114/68  Pulse:   (!) 146   Resp:  (!) 29 (!) 34 (!) 35  Temp: (!) 97.5 F (36.4 C)     TempSrc: Oral     SpO2:   97%   Weight:        Constitutional: NAD, calm, comfortable Vitals:   12/05/17 0103 12/05/17 0130 12/05/17 0230 12/05/17 0300  BP:  112/79 98/68 114/68  Pulse:   (!) 146   Resp:  (!) 29 (!) 34 (!) 35  Temp: (!) 97.5 F (36.4 C)     TempSrc: Oral     SpO2:   97%   Weight:       Eyes: lids and conjunctivae normal ENMT:  Mucous membranes are moist.  Neck: normal, supple Respiratory: clear to auscultation bilaterally. Normal respiratory effort. No accessory muscle use.  Cardiovascular: Regular rate and rhythm, no murmurs. No extremity edema. Abdomen: Noted to have guarding rigidity and rebound in all 4 quadrants. Musculoskeletal:  No joint deformity upper and lower extremities.   Skin: no rashes, lesions, ulcers.    Labs on Admission: I have personally reviewed following labs and imaging studies  CBC: Recent Labs  Lab 11/23/2017 2314  WBC 16.1*  NEUTROABS 13.6  HGB 14.7  HCT 43.1  MCV 96.6  PLT 481   Basic Metabolic Panel: Recent Labs  Lab 12/14/2017 2314  NA 133*  K 4.3  CL 100  CO2 19*  GLUCOSE 92  BUN 50*  CREATININE 2.33*  CALCIUM 8.0*   GFR: CrCl cannot be calculated (Unknown ideal weight.). Liver Function Tests: Recent Labs  Lab 12/03/2017 2314  AST 40  ALT 15  ALKPHOS 71  BILITOT 0.8  PROT 6.1*  ALBUMIN 2.5*   Recent Labs  Lab 11/16/2017 2314  LIPASE 18   No results for input(s): AMMONIA in the last 168 hours. Coagulation Profile: No results for input(s): INR, PROTIME in the last 168 hours. Cardiac Enzymes: No results for input(s): CKTOTAL, CKMB, CKMBINDEX, TROPONINI in the last 168 hours. BNP (last 3 results) No results for input(s): PROBNP in the last 8760 hours. HbA1C: No results for input(s): HGBA1C in the last 72 hours. CBG: No results for input(s): GLUCAP in the last 168 hours. Lipid Profile: No results for input(s): CHOL, HDL, LDLCALC, TRIG, CHOLHDL, LDLDIRECT in the last 72 hours. Thyroid Function Tests: No results for input(s): TSH, T4TOTAL, FREET4, T3FREE, THYROIDAB in the last 72 hours. Anemia Panel: No results for input(s): VITAMINB12, FOLATE, FERRITIN, TIBC, IRON, RETICCTPCT in the last 72 hours. Urine analysis:    Component Value Date/Time   COLORURINE AMBER (A) 11/19/2017 2230   APPEARANCEUR HAZY (A)  12/14/2017 2230   LABSPEC 1.025 12/03/2017 2230    PHURINE 5.0 12/06/2017 2230   GLUCOSEU NEGATIVE 12/13/2017 2230   HGBUR MODERATE (A) 12/09/2017 2230   BILIRUBINUR SMALL (A) 11/22/2017 2230   KETONESUR NEGATIVE 12/12/2017 2230   PROTEINUR 30 (A) 11/28/2017 2230   UROBILINOGEN 1.0 05/11/2013 0754   NITRITE NEGATIVE 11/23/2017 2230   LEUKOCYTESUR NEGATIVE 11/28/2017 2230    Radiological Exams on Admission: Ct Abdomen Pelvis Wo Contrast  Result Date: 12/05/2017 CLINICAL DATA:  Abdominal pain, jaundice EXAM: CT ABDOMEN AND PELVIS WITHOUT CONTRAST TECHNIQUE: Multidetector CT imaging of the abdomen and pelvis was performed following the standard protocol without IV contrast. COMPARISON:  CT 09/30/2006, ultrasound abdomen 10/17/2013 FINDINGS: Lower chest: Lung bases demonstrate no pleural effusion. Partial consolidation in the posterior right lung base may reflect atelectasis. There is cardiomegaly. Coronary vascular calcification. Small hiatal hernia. Hepatobiliary: No calcified gallstones. No focal hepatic abnormality or biliary enlargement Pancreas: Unremarkable. No pancreatic ductal dilatation or surrounding inflammatory changes. Spleen: Normal in size without focal abnormality. Adrenals/Urinary Tract: Right adrenal gland is normal. Stable 2 cm low-density mass left adrenal gland. Atrophic kidneys bilaterally. No hydronephrosis on the left. Small left renal calculi versus intrarenal vascular calcification. 4 mm stone mid right kidney. Moderate severe hydronephrosis of extrarenal pelvis as noted on prior study. The bladder contains a Foley catheter and air. Stomach/Bowel: The stomach is nonenlarged. Multiple loops of dilated fluid-filled small bowel. Focal wall thickening involving the distal transverse colon. Suspected abnormally thickened small bowel loops in the right lower quadrant/right hemipelvis, series 2, image number 51, series 5, image number 33. Significant stool impaction within the rectosigmoid colon. No colon wall thickening. Negative  appendix. Vascular/Lymphatic: Extensive aortic atherosclerosis. No significantly enlarged lymph nodes. Reproductive: Status post hysterectomy. No adnexal masses. Other: Moderate to large amount of pneumoperitoneum. Small gas and fluid collection in the right posterior pelvis, measuring 5.1 cm. Musculoskeletal: Scoliosis and degenerative changes of the spine. Prior right hip replacement with normal alignment. IMPRESSION: 1. Large amount of pneumoperitoneum, consistent with hollow viscus perforation. Potential source in the right lower quadrant/upper pelvis where there is suspected abnormal small bowel thickening. Suspected extraluminal fluid and gas collection in the right posterior pelvis, near the abnormally thickened small bowel loops. Could consider ischemic enteritis given significant degree of vascular disease present. 2. Dilated stool-filled colon and rectum, consistent with a fecal impaction. No apparent sigmoid colon wall thickening. Small focal area of wall thickening at the distal transverse colon may reflect a small focus of colitis. 3. Atrophic kidneys bilaterally. 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 Stones are present within both kidneys. Critical Value/emergent results were called by telephone at the time of interpretation on 12/05/2017 at 2:38 am to Dr. Stark Jock, who verbally acknowledged these results. Electronically Signed   By: Donavan Foil M.D.   On: 12/05/2017 02:38   Dg Chest Port 1 View  Result Date: 11/17/2017 CLINICAL DATA:  Tachycardia EXAM: PORTABLE CHEST 1 VIEW COMPARISON:  11/07/2016 FINDINGS: Post sternotomy changes. Enlarged cardiomediastinal silhouette with enlarged central pulmonary arteries. No acute consolidation or effusion. Aortic atherosclerosis. No pneumothorax. IMPRESSION: 1. Mild cardiomegaly.  No edema or infiltrate 2. Prominent central pulmonary arteries, possible pulmonary  hypertension. Electronically Signed   By: Donavan Foil M.D.   On: 12/02/2017 23:47    EKG: Independently reviewed.  Atrial fibrillation with RVR rate 144.  Assessment/Plan Principal Problem:   Severe sepsis (HCC) Active Problems:  COPD (chronic obstructive pulmonary disease) (HCC)   Idiopathic mild intellectual disability   CKD (chronic kidney disease), stage III (HCC)   CAD (coronary artery disease)   Chronic combined systolic and diastolic CHF (congestive heart failure) (HCC)   Pneumoperitoneum    -Severe sepsis secondary to bowel perforation with large pneumoperitoneum.  Patient also has associated AKI on CKD stage III along with multiple medical comorbidities.  ED physician had spoken with general surgery who does not recommend any surgical exploration at this time and did advise for patient to receive some IV antibiotics.  I have then spoken with the patient's brother who is her next of kin as she has no official power of attorney.  I had offered to admit the patient for IV fluid hydration and conservative management with IV antibiotics, but discussed her grave prognosis in this situation given her medical comorbidities.  He understands the situation well and states that he would like for the patient to be DNR and comfort care only.  Admit for comfort care and start IV morphine and Ativan pushes as needed with comfort feeds and consult to palliative care.  DVT prophylaxis: None Code Status: DNR, Comfort measures Family Communication: Brother Luiz Ochoa over the phone Disposition Plan:Comfort Care Consults called:Palliative Care Admission status: Observation, MedSurg   Rodena Goldmann DO [facility] Pager [phone]  If 7PM-7AM, please contact night-coverage [email or web address] Password [facility]  12/05/2017, 4:17 AM

## 2017-12-05 NOTE — Progress Notes (Signed)
Patient not alert enough to attempt feeding at this time.  Received ativan on previous shift.  Patient appears to be resting peacefully, arouses slightly/moans with verbal and tactile stimulation. Will continue to monitor

## 2017-12-05 NOTE — ED Notes (Signed)
Ac called for vancomycin

## 2017-12-05 NOTE — ED Notes (Signed)
pts contact Gene Schwartz [phone]

## 2017-12-05 NOTE — ED Notes (Signed)
I-Stat results did not cross over. EDP notified.

## 2017-12-05 NOTE — ED Notes (Signed)
Pt and family updated on plan of care,  

## 2017-12-05 NOTE — Progress Notes (Signed)
This is an assumption of care note on a 72 year old who is known to have severe coronary artery occlusive disease with ischemic cardiomyopathy and CHF, COPD, chronic kidney disease stage III, paroxysmal atrial fib, GERD, mental retardation and previous GI bleeds.  She came to the emergency department because of abdominal pain with nausea vomiting and fever.  She was found to be in atrial fib with RVR.  She had pneumoperitoneum and presumed bowel perforation with sepsis from that.  She had A. fib with rapid ventricular response and acute kidney injury.  Family requested comfort care as she was not felt to be a surgical candidate.  This morning she appears comfortable sleeping.

## 2017-12-05 NOTE — ED Notes (Signed)
Dr Shah at bedside,  

## 2017-12-05 NOTE — ED Notes (Signed)
Received report from previous RN, pt confused at times, skin jaundice, has multiple bruises to bilateral arms that family at bedside reports is from numerous attempts for blood work and iv's at nursing home, pt a-fib hr 130-140"s, Almyra Free PA aware,

## 2017-12-05 NOTE — ED Notes (Signed)
Date and time results received: 12/05/17 1:57 AM  (use smartphrase ".now" to insert current time)  Test: I-Stat Lactic Acid Critical Value: 2.33  Name of Provider Notified: Delo  Orders Received? Or Actions Taken?: n/a

## 2017-12-06 ENCOUNTER — Other Ambulatory Visit: Payer: Self-pay

## 2017-12-06 DIAGNOSIS — Z515 Encounter for palliative care: Secondary | ICD-10-CM

## 2017-12-06 LAB — CG4 I-STAT (LACTIC ACID): LACTIC ACID, VENOUS: 2.33 mmol/L — AB (ref 0.5–1.9)

## 2017-12-06 NOTE — Progress Notes (Signed)
Subjective: She is unresponsive.  Family at bedside says she had a good night open her eyes but did not seem to be in any distress.  Objective: Vital signs in last 24 hours: SpO2:  [97 %] 97 % (07/22 2021) Weight change:  Last BM Date: (unable to assess;unconcious)  Intake/Output from previous day: 07/22 0701 - 07/23 0700 In: 3 [I.V.:3] Out: -   PHYSICAL EXAM General appearance: Unresponsive sleeping soundly Resp: rhonchi bilaterally Cardio: Still with atrial fib ventricular response around 150 GI: Fairly rigid abdomen Extremities: extremities normal, atraumatic, no cyanosis or edema  Lab Results:  Results for orders placed or performed during the hospital encounter of 11/18/2017 (from the past 48 hour(s))  Urinalysis, Routine w reflex microscopic     Status: Abnormal   Collection Time: 11/16/2017 10:30 PM  Result Value Ref Range   Color, Urine AMBER (A) YELLOW    Comment: BIOCHEMICALS MAY BE AFFECTED BY COLOR   APPearance HAZY (A) CLEAR   Specific Gravity, Urine 1.025 1.005 - 1.030   pH 5.0 5.0 - 8.0   Glucose, UA NEGATIVE NEGATIVE mg/dL   Hgb urine dipstick MODERATE (A) NEGATIVE   Bilirubin Urine SMALL (A) NEGATIVE   Ketones, ur NEGATIVE NEGATIVE mg/dL   Protein, ur 30 (A) NEGATIVE mg/dL   Nitrite NEGATIVE NEGATIVE   Leukocytes, UA NEGATIVE NEGATIVE   RBC / HPF 11-20 0 - 5 RBC/hpf   WBC, UA 6-10 0 - 5 WBC/hpf   Bacteria, UA NONE SEEN NONE SEEN   Squamous Epithelial / LPF 0-5 0 - 5   Mucus PRESENT    Hyaline Casts, UA PRESENT    Ca Oxalate Crys, UA PRESENT     Comment: Performed at [facility], [address], [place], [place] [address]  Comprehensive metabolic panel     Status: Abnormal   Collection Time: 11/15/2017 11:14 PM  Result Value Ref Range   Sodium 133 (L) 135 - 145 mmol/L   Potassium 4.3 3.5 - 5.1 mmol/L   Chloride 100 98 - 111 mmol/L    Comment: Please note change in reference range.   CO2 19 (L) 22 - 32 mmol/L   Glucose, Bld 92 70 - 99 mg/dL    Comment:  Please note change in reference range.   BUN 50 (H) 8 - 23 mg/dL    Comment: Please note change in reference range.   Creatinine, Ser 2.33 (H) 0.44 - 1.00 mg/dL   Calcium 8.0 (L) 8.9 - 10.3 mg/dL   Total Protein 6.1 (L) 6.5 - 8.1 g/dL   Albumin 2.5 (L) 3.5 - 5.0 g/dL   AST 40 15 - 41 U/L   ALT 15 0 - 44 U/L    Comment: Please note change in reference range.   Alkaline Phosphatase 71 38 - 126 U/L   Total Bilirubin 0.8 0.3 - 1.2 mg/dL   GFR calc non Af Amer 20 (L) >60 mL/min   GFR calc Af Amer 23 (L) >60 mL/min    Comment: (NOTE) The eGFR has been calculated using the CKD EPI equation. This calculation has not been validated in all clinical situations. eGFR's persistently <60 mL/min signify possible Chronic Kidney Disease.    Anion gap 14 5 - 15    Comment: Performed at [facility], [address], [place], [place] [address]  Lipase, blood     Status: None   Collection Time: 12/05/2017 11:14 PM  Result Value Ref Range   Lipase 18 11 - 51 U/L  Comment: Performed at [facility], [address], [place], [place] [address]  CBC with Diff     Status: Abnormal   Collection Time: 12/03/2017 11:14 PM  Result Value Ref Range   WBC 16.1 (H) 4.0 - 10.5 K/uL   RBC 4.46 3.87 - 5.11 MIL/uL   Hemoglobin 14.7 12.0 - 15.0 g/dL   HCT 43.1 36.0 - 46.0 %   MCV 96.6 78.0 - 100.0 fL   MCH 33.0 26.0 - 34.0 pg   MCHC 34.1 30.0 - 36.0 g/dL   RDW 14.0 11.5 - 15.5 %   Platelets 313 150 - 400 K/uL   Neutrophils Relative % 85 %   Neutro Abs 13.6 1.7 - 7.7 K/uL   Lymphocytes Relative 7 %   Lymphs Abs 1.2 0.7 - 4.0 K/uL   Monocytes Relative 8 %   Monocytes Absolute 1.3 (H) 0.1 - 1.0 K/uL   Eosinophils Relative 0 %   Eosinophils Absolute 0.0 0.0 - 0.7 K/uL   Basophils Relative 0 %   Basophils Absolute 0.0 0.0 - 0.1 K/uL    Comment: Performed at [facility], [address], [place], [place] [address]  Blood Culture (routine x 2)     Status: None (Preliminary result)   Collection Time: 11/29/2017 11:14 PM   Result Value Ref Range   Specimen Description LEFT ANTECUBITAL    Special Requests      BOTTLES DRAWN AEROBIC AND ANAEROBIC Blood Culture adequate volume   Culture      NO GROWTH 2 DAYS Performed at [facility], [address], [place], [place] [address]    Report Status PENDING   I-Stat CG4 Lactic Acid, ED  (not at  [facility])     Status: Abnormal   Collection Time: 12/02/2017 11:16 PM  Result Value Ref Range   Lactic Acid, Venous 2.51 (HH) 0.5 - 1.9 mmol/L   Comment NOTIFIED PHYSICIAN   Blood Culture (routine x 2)     Status: None (Preliminary result)   Collection Time: 11/27/2017 11:44 PM  Result Value Ref Range   Specimen Description RIGHT ANTECUBITAL    Special Requests      BOTTLES DRAWN AEROBIC ONLY Blood Culture adequate volume   Culture      NO GROWTH 2 DAYS Performed at [facility], [address] [place], [place] [address]    Report Status PENDING   MRSA PCR Screening     Status: None   Collection Time: 12/05/17  6:13 AM  Result Value Ref Range   MRSA by PCR NEGATIVE NEGATIVE    Comment:        The GeneXpert MRSA Assay (FDA approved for NASAL specimens only), is one component of a comprehensive MRSA colonization surveillance program. It is not intended to diagnose MRSA infection nor to guide or monitor treatment for MRSA infections. Performed at [facility], [address] [place],  [address]     ABGS No results for input(s): PHART, PO2ART, TCO2, HCO3 in the last 72 hours.  Invalid input(s): PCO2 CULTURES Recent Results (from the past 240 hour(s))  Blood Culture (routine x 2)     Status: None (Preliminary result)   Collection Time: 12/03/2017 11:14 PM  Result Value Ref Range Status   Specimen Description LEFT ANTECUBITAL  Final   Special Requests   Final    BOTTLES DRAWN AEROBIC AND ANAEROBIC Blood Culture adequate volume   Culture   Final    NO GROWTH 2 DAYS Performed at [facility], [place]  [address] [place], [place] [address]    Report Status  PENDING  Incomplete  Blood Culture (routine x 2)     Status: None (Preliminary result)   Collection Time: 12/02/2017 11:44 PM  Result Value Ref Range Status   Specimen Description RIGHT ANTECUBITAL  Final   Special Requests   Final    BOTTLES DRAWN AEROBIC ONLY Blood Culture adequate volume   Culture   Final    NO GROWTH 2 DAYS Performed at [facility], [address], [place], [place] [address]    Report Status PENDING  Incomplete  MRSA PCR Screening     Status: None   Collection Time: 12/05/17  6:13 AM  Result Value Ref Range Status   MRSA by PCR NEGATIVE NEGATIVE Final    Comment:        The GeneXpert MRSA Assay (FDA approved for NASAL specimens only), is one component of a comprehensive MRSA colonization surveillance program. It is not intended to diagnose MRSA infection nor to guide or monitor treatment for MRSA infections. Performed at [facility], [address] [place], [place] [address]    Studies/Results: Ct Abdomen Pelvis Wo Contrast  Result Date: 12/05/2017 CLINICAL DATA:  Abdominal pain, jaundice EXAM: CT ABDOMEN AND PELVIS WITHOUT CONTRAST TECHNIQUE: Multidetector CT imaging of the abdomen and pelvis was performed following the standard protocol without IV contrast. COMPARISON:  CT 09/30/2006, ultrasound abdomen 10/17/2013 FINDINGS: Lower chest: Lung bases demonstrate no pleural effusion. Partial consolidation in the posterior right lung base may reflect atelectasis. There is cardiomegaly. Coronary vascular calcification. Small hiatal hernia. Hepatobiliary: No calcified gallstones. No focal hepatic abnormality or biliary enlargement Pancreas: Unremarkable. No pancreatic ductal dilatation or surrounding inflammatory changes. Spleen: Normal in size without focal abnormality. Adrenals/Urinary Tract: Right adrenal gland is normal. Stable 2 cm low-density mass left adrenal gland. Atrophic kidneys bilaterally. No hydronephrosis on the left. Small left renal calculi versus  intrarenal vascular calcification. 4 mm stone mid right kidney. Moderate severe hydronephrosis of extrarenal pelvis as noted on prior study. The bladder contains a Foley catheter and air. Stomach/Bowel: The stomach is nonenlarged. Multiple loops of dilated fluid-filled small bowel. Focal wall thickening involving the distal transverse colon. Suspected abnormally thickened small bowel loops in the right lower quadrant/right hemipelvis, series 2, image number 51, series 5, image number 33. Significant stool impaction within the rectosigmoid colon. No colon wall thickening. Negative appendix. Vascular/Lymphatic: Extensive aortic atherosclerosis. No significantly enlarged lymph nodes. Reproductive: Status post hysterectomy. No adnexal masses. Other: Moderate to large amount of pneumoperitoneum. Small gas and fluid collection in the right posterior pelvis, measuring 5.1 cm. Musculoskeletal: Scoliosis and degenerative changes of the spine. Prior right hip replacement with normal alignment. IMPRESSION: 1. Large amount of pneumoperitoneum, consistent with hollow viscus perforation. Potential source in the right lower quadrant/upper pelvis where there is suspected abnormal small bowel thickening. Suspected extraluminal fluid and gas collection in the right posterior pelvis, near the abnormally thickened small bowel loops. Could consider ischemic enteritis given significant degree of vascular disease present. 2. Dilated stool-filled colon and rectum, consistent with a fecal impaction. No apparent sigmoid colon wall thickening. Small focal area of wall thickening at the distal transverse colon may reflect a small focus of colitis. 3. Atrophic kidneys bilaterally. 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 Stones are present within both kidneys. Critical Value/emergent results were called by telephone at the time of interpretation on  12/05/2017 at 2:38 am to Dr. Stark Jock,  who verbally acknowledged these results. Electronically Signed   By: Donavan Foil M.D.   On: 12/05/2017 02:38   Dg Chest Port 1 View  Result Date: 11/28/2017 CLINICAL DATA:  Tachycardia EXAM: PORTABLE CHEST 1 VIEW COMPARISON:  11/07/2016 FINDINGS: Post sternotomy changes. Enlarged cardiomediastinal silhouette with enlarged central pulmonary arteries. No acute consolidation or effusion. Aortic atherosclerosis. No pneumothorax. IMPRESSION: 1. Mild cardiomegaly.  No edema or infiltrate 2. Prominent central pulmonary arteries, possible pulmonary hypertension. Electronically Signed   By: Donavan Foil M.D.   On: 11/24/2017 23:47    Medications:  Prior to Admission:  Medications Prior to Admission  Medication Sig Dispense Refill Last Dose  . acetaminophen (TYLENOL) 325 MG tablet Take 2 tablets (650 mg total) by mouth every 6 (six) hours as needed for mild pain or fever. (Patient taking differently: Take 650 mg by mouth 3 (three) times daily. )   11/27/2017 at 1200  . amLODipine (NORVASC) 2.5 MG tablet Take 1 tablet (2.5 mg total) by mouth daily. 30 tablet 6 12/02/2017 at Unknown time  . aspirin EC 81 MG tablet Take 1 tablet (81 mg total) by mouth daily. 30 tablet 6 12/05/2017 at Unknown time  . atorvastatin (LIPITOR) 10 MG tablet Take 10 mg by mouth at bedtime.    12/03/2017 at Unknown time  . budesonide-formoterol (SYMBICORT) 160-4.5 MCG/ACT inhaler Inhale 2 puffs into the lungs 2 (two) times daily.   12/01/2017 at Unknown time  . guaiFENesin (MUCINEX) 600 MG 12 hr tablet Take 600 mg by mouth 2 (two) times daily.   12/10/2017 at Unknown time  . ipratropium-albuterol (DUONEB) 0.5-2.5 (3) MG/3ML SOLN Take 3 mLs by nebulization every 6 (six) hours as needed (SHORTNESS OF BREATH).    12/02/2017 at Unknown time  . OXYGEN Inhale 2 L into the lungs daily.   12/03/2017 at Unknown time  . promethazine (PHENERGAN) 25 MG/ML injection Inject 25 mg into the vein once.   12/06/2017 at  Unknown time  . ranitidine (ZANTAC) 150 MG capsule Take 150 mg by mouth 2 (two) times daily.   11/17/2017 at Unknown time  . sertraline (ZOLOFT) 50 MG tablet Take 50 mg by mouth daily.    11/18/2017 at Unknown time  . tiotropium (SPIRIVA) 18 MCG inhalation capsule Place 18 mcg into inhaler and inhale daily.   11/23/2017 at Unknown time   Scheduled: . morphine CONCENTRATE  2.6-5 mg Oral Q4H  . sodium chloride flush  3 mL Intravenous Q12H   Continuous: . sodium chloride     PNT:IRWERX chloride, LORazepam, morphine CONCENTRATE, ondansetron **OR** ondansetron (ZOFRAN) IV, polyvinyl alcohol, sodium chloride flush  Assesment: She was admitted with pneumoperitoneum.  She had severe sepsis from that.  She is on comfort care.  She appears to be comfortable.  She continues to have atrial fibrillation RVR.  Certainly possible that she could die today Principal Problem:   Severe sepsis ([place]) Active Problems:   COPD (chronic obstructive pulmonary disease) (HCC)   Idiopathic mild intellectual disability   CKD (chronic kidney disease), stage III (HCC)   CAD (coronary artery disease)   Chronic combined systolic and diastolic CHF (congestive heart failure) (HCC)   Pneumoperitoneum   Goals of care, counseling/discussion   Palliative care by specialist   Encounter for hospice care discussion    Plan: Continue comfort care    LOS: 1 day   Vung Kush L 12/06/2017, 8:46 AM

## 2017-12-06 NOTE — Progress Notes (Addendum)
Palliative: Alexis Dillon is resting quietly in bed.  She appears comfortable, sleeping with her eyes closed.  She does not open her eyes when I touch her, and is breathing about 8 times per minute.  Present today at bedside his brother Alexis Dillon, and a nephew.  We talked about Alexis Dillon symptoms such as pain or anxiety.  Family denies needs, stating they feel she is comfortable.  Anticipated hospital death likely within the next 24 to 48 hours. Conversation with nursing staff related to symptom management, comfort care. Contact with social worker related to residential hospice.  At this time, family desires an in hospital death over residential hospice.  I believe that Alexis Dillon may be too unstable for transport now. 28 minutes Alexis Axe, NP Palliative Medicine Team Team Phone # [phone]  Greater than 50% of this time was spent counseling and coordinating care related to the above assessment and plan

## 2017-12-07 DIAGNOSIS — K631 Perforation of intestine (nontraumatic): Secondary | ICD-10-CM

## 2017-12-07 NOTE — Progress Notes (Signed)
Subjective: She continues unresponsive.  Family says that she did squeeze their hand last night.  She does not appear to be uncomfortable  Objective: Vital signs in last 24 hours: Temp:  [97.7 F (36.5 C)-99.9 F (37.7 C)] 97.7 F (36.5 C) (07/24 0758) Pulse Rate:  [112-129] 112 (07/24 0758) Resp:  [7-17] 17 (07/24 0758) BP: (88-106)/(60-69) 106/69 (07/24 0758) SpO2:  [97 %-99 %] 99 % (07/24 0758) Weight change:  Last BM Date: (unable to assess;unconcious)  Intake/Output from previous day: No intake/output data recorded.  PHYSICAL EXAM General appearance: Unresponsive.  Occasionally opens her eyes. Resp: clear to auscultation bilaterally Cardio: Her heart is irregularly irregular and her heart rate has dropped and is now in the 80s GI: She grimaces when her abdomen is palpated Extremities: extremities normal, atraumatic, no cyanosis or edema  Lab Results:  No results found for this or any previous visit (from the past 48 hour(s)).  ABGS No results for input(s): PHART, PO2ART, TCO2, HCO3 in the last 72 hours.  Invalid input(s): PCO2 CULTURES Recent Results (from the past 240 hour(s))  Blood Culture (routine x 2)     Status: None (Preliminary result)   Collection Time: 12/14/2017 11:14 PM  Result Value Ref Range Status   Specimen Description LEFT ANTECUBITAL  Final   Special Requests   Final    BOTTLES DRAWN AEROBIC AND ANAEROBIC Blood Culture adequate volume   Culture   Final    NO GROWTH 3 DAYS Performed at [facility], [address] [place], [place] [address]    Report Status PENDING  Incomplete  Blood Culture (routine x 2)     Status: None (Preliminary result)   Collection Time: 11/14/2017 11:44 PM  Result Value Ref Range Status   Specimen Description RIGHT ANTECUBITAL  Final   Special Requests   Final    BOTTLES DRAWN AEROBIC ONLY Blood Culture adequate volume   Culture   Final    NO GROWTH 3 DAYS Performed at [facility], [address], [place], [place]  [address]    Report Status PENDING  Incomplete  MRSA PCR Screening     Status: None   Collection Time: 12/05/17  6:13 AM  Result Value Ref Range Status   MRSA by PCR NEGATIVE NEGATIVE Final    Comment:        The GeneXpert MRSA Assay (FDA approved for NASAL specimens only), is one component of a comprehensive MRSA colonization surveillance program. It is not intended to diagnose MRSA infection nor to guide or monitor treatment for MRSA infections. Performed at [facility], [address] [place], [place] [address]    Studies/Results: No results found.  Medications:  Prior to Admission:  Medications Prior to Admission  Medication Sig Dispense Refill Last Dose  . acetaminophen (TYLENOL) 325 MG tablet Take 2 tablets (650 mg total) by mouth every 6 (six) hours as needed for mild pain or fever. (Patient taking differently: Take 650 mg by mouth 3 (three) times daily. )   11/22/2017 at 1200  . amLODipine (NORVASC) 2.5 MG tablet Take 1 tablet (2.5 mg total) by mouth daily. 30 tablet 6 12/11/2017 at Unknown time  . aspirin EC 81 MG tablet Take 1 tablet (81 mg total) by mouth daily. 30 tablet 6 12/12/2017 at Unknown time  . atorvastatin (LIPITOR) 10 MG tablet Take 10 mg by mouth at bedtime.    12/03/2017 at Unknown time  . budesonide-formoterol (SYMBICORT) 160-4.5 MCG/ACT inhaler Inhale 2 puffs into the lungs 2 (two)  times daily.   12/06/2017 at Unknown time  . guaiFENesin (MUCINEX) 600 MG 12 hr tablet Take 600 mg by mouth 2 (two) times daily.   11/21/2017 at Unknown time  . ipratropium-albuterol (DUONEB) 0.5-2.5 (3) MG/3ML SOLN Take 3 mLs by nebulization every 6 (six) hours as needed (SHORTNESS OF BREATH).    12/02/2017 at Unknown time  . OXYGEN Inhale 2 Dillon into the lungs daily.   12/07/2017 at Unknown time  . promethazine (PHENERGAN) 25 MG/ML injection Inject 25 mg into the vein once.   11/28/2017 at Unknown time  . ranitidine (ZANTAC) 150 MG capsule Take 150 mg by mouth 2 (two) times daily.    11/27/2017 at Unknown time  . sertraline (ZOLOFT) 50 MG tablet Take 50 mg by mouth daily.    12/03/2017 at Unknown time  . tiotropium (SPIRIVA) 18 MCG inhalation capsule Place 18 mcg into inhaler and inhale daily.   12/06/2017 at Unknown time   Scheduled: . morphine CONCENTRATE  2.6-5 mg Oral Q4H  . sodium chloride flush  3 mL Intravenous Q12H   Continuous: . sodium chloride     TDD:UKGURK chloride, LORazepam, morphine CONCENTRATE, ondansetron **OR** ondansetron (ZOFRAN) IV, polyvinyl alcohol, sodium chloride flush  Assesment: She was admitted with pneumoperitoneum.  She was septic.  She has multiple other medical problems including severe COPD, intellectual disability, chronic kidney disease, coronary disease, heart failure and anxiety and depression.  She is full comfort care.  I expect she will probably die in the next 24 hours Principal Problem:   Severe sepsis ([place]) Active Problems:   COPD (chronic obstructive pulmonary disease) (HCC)   Idiopathic mild intellectual disability   CKD (chronic kidney disease), stage III (HCC)   CAD (coronary artery disease)   Chronic combined systolic and diastolic CHF (congestive heart failure) (HCC)   Pneumoperitoneum   Goals of care, counseling/discussion   Palliative care by specialist   Encounter for hospice care discussion   End of life care    Plan: As above    LOS: 2 days   Alexis Dillon 12/07/2017, 8:39 AM

## 2017-12-07 NOTE — Progress Notes (Signed)
Morphine concentrate oral solution 2.6 mg scheduled dose has not been administered at scheduled time. Pt's family has refused dose at this time and states that patient is " resting comfortably." Will continue to monitor patient.

## 2017-12-07 NOTE — Progress Notes (Signed)
Palliative: Alexis Dillon is lying quietly in bed.  She appears relatively comfortable, but has her eyes open.  Family is at bedside.  They shared that they feel she is comfortable, just received pain medication.  We talked about her eyes being open, I share that time is likely short.  Family agrees.  No further needs noted. Consult with nursing staff related to symptom management/comfort care.  Encouraged to call as needed. 36 minutes [name], NP Palliative Medicine Team Team Phone # [phone]  Greater than 50% of this time was spent counseling and coordinating care related to the above assessment and plan

## 2017-12-07 NOTE — Clinical Social Work Note (Signed)
CSW consult received for possible residential hospice referral. Per APNP and MD notes, pt is not appropriate to transfer at this time. In hospital death is anticipated. CSW will sign off. Please re-consult if needed.

## 2017-12-09 LAB — CULTURE, BLOOD (ROUTINE X 2)
Culture: NO GROWTH
Culture: NO GROWTH
SPECIAL REQUESTS: ADEQUATE
Special Requests: ADEQUATE

## 2017-12-15 NOTE — Progress Notes (Signed)
Post mortem complete by carenurse prn adapters removed from right and left ac,tip observed and intact,foley cath removed by carenurse. tag placed on left great toe,body placed in body bag by carenurse with patient sticker on outer bag.Call placed to bed control for instructions ,Received call from [facility] advising that [facility] will pick up the same from present room.

## 2017-12-15 NOTE — Progress Notes (Signed)
Received return call from Dr. Darrick Meigs regarding expiration,requesting death certificate,advised that family has been notified.call ended,

## 2017-12-15 NOTE — Progress Notes (Signed)
Dr Darrick Meigs present to complete death certificate,family present at bedside.

## 2017-12-15 NOTE — Progress Notes (Signed)
Dr. Darrick Meigs notified of expiration @ 0330.primary care nurse and charge nurse present to verify the same.Family called ,carenurse requested their return to hospital.

## 2017-12-15 DEATH — deceased

## 2018-01-15 NOTE — Discharge Summary (Signed)
Physician Discharge Summary  Patient ID: Alexis Dillon MRN: [id number] DOB/AGE: Jan 04, 1946 72 y.o. Primary Care Physician:Zaccheus Edmister, Percell Miller, MD Admit date: 11/23/2017 Discharge date: 12/19/2017    Discharge Diagnoses:   Principal Problem:   Severe sepsis [facility] Active Problems:   COPD (chronic obstructive pulmonary disease) (HCC)   Idiopathic mild intellectual disability   CKD (chronic kidney disease), stage III (HCC)   CAD (coronary artery disease)   Chronic combined systolic and diastolic CHF (congestive heart failure) (HCC)   Pneumoperitoneum   Goals of care, counseling/discussion   Palliative care by specialist   Encounter for hospice care discussion   End of life care   Small bowel perforation ([place])   Allergies as of 12/12/2017   No Known Allergies     Medication List    ASK your doctor about these medications   acetaminophen 325 MG tablet Commonly known as:  TYLENOL Take 2 tablets (650 mg total) by mouth every 6 (six) hours as needed for mild pain or fever.   amLODipine 2.5 MG tablet Commonly known as:  NORVASC Take 1 tablet (2.5 mg total) by mouth daily.   aspirin EC 81 MG tablet Take 1 tablet (81 mg total) by mouth daily.   atorvastatin 10 MG tablet Commonly known as:  LIPITOR Take 10 mg by mouth at bedtime.   budesonide-formoterol 160-4.5 MCG/ACT inhaler Commonly known as:  SYMBICORT Inhale 2 puffs into the lungs 2 (two) times daily.   guaiFENesin 600 MG 12 hr tablet Commonly known as:  MUCINEX Take 600 mg by mouth 2 (two) times daily.   ipratropium-albuterol 0.5-2.5 (3) MG/3ML Soln Commonly known as:  DUONEB Take 3 mLs by nebulization every 6 (six) hours as needed (SHORTNESS OF BREATH).   OXYGEN Inhale 2 L into the lungs daily.   promethazine 25 MG/ML injection Commonly known as:  PHENERGAN Inject 25 mg into the vein once.   ranitidine 150 MG capsule Commonly known as:  ZANTAC Take 150 mg by mouth 2 (two) times daily.   sertraline 50 MG  tablet Commonly known as:  ZOLOFT Take 50 mg by mouth daily.   tiotropium 18 MCG inhalation capsule Commonly known as:  SPIRIVA Place 18 mcg into inhaler and inhale daily.       Discharged Condition: Deceased    Consults: Palliative care/telephone general surgery  Significant Diagnostic Studies: Ct Abdomen Pelvis Wo Contrast  Result Date: 12/05/2017 CLINICAL DATA:  Abdominal pain, jaundice EXAM: CT ABDOMEN AND PELVIS WITHOUT CONTRAST TECHNIQUE: Multidetector CT imaging of the abdomen and pelvis was performed following the standard protocol without IV contrast. COMPARISON:  CT 09/30/2006, ultrasound abdomen 10/17/2013 FINDINGS: Lower chest: Lung bases demonstrate no pleural effusion. Partial consolidation in the posterior right lung base may reflect atelectasis. There is cardiomegaly. Coronary vascular calcification. Small hiatal hernia. Hepatobiliary: No calcified gallstones. No focal hepatic abnormality or biliary enlargement Pancreas: Unremarkable. No pancreatic ductal dilatation or surrounding inflammatory changes. Spleen: Normal in size without focal abnormality. Adrenals/Urinary Tract: Right adrenal gland is normal. Stable 2 cm low-density mass left adrenal gland. Atrophic kidneys bilaterally. No hydronephrosis on the left. Small left renal calculi versus intrarenal vascular calcification. 4 mm stone mid right kidney. Moderate severe hydronephrosis of extrarenal pelvis as noted on prior study. The bladder contains a Foley catheter and air. Stomach/Bowel: The stomach is nonenlarged. Multiple loops of dilated fluid-filled small bowel. Focal wall thickening involving the distal transverse colon. Suspected abnormally thickened small bowel loops in the right lower quadrant/right hemipelvis, series 2, image number 51, series  5, image number 33. Significant stool impaction within the rectosigmoid colon. No colon wall thickening. Negative appendix. Vascular/Lymphatic: Extensive aortic  atherosclerosis. No significantly enlarged lymph nodes. Reproductive: Status post hysterectomy. No adnexal masses. Other: Moderate to large amount of pneumoperitoneum. Small gas and fluid collection in the right posterior pelvis, measuring 5.1 cm. Musculoskeletal: Scoliosis and degenerative changes of the spine. Prior right hip replacement with normal alignment. IMPRESSION: 1. Large amount of pneumoperitoneum, consistent with hollow viscus perforation. Potential source in the right lower quadrant/upper pelvis where there is suspected abnormal small bowel thickening. Suspected extraluminal fluid and gas collection in the right posterior pelvis, near the abnormally thickened small bowel loops. Could consider ischemic enteritis given significant degree of vascular disease present. 2. Dilated stool-filled colon and rectum, consistent with a fecal impaction. No apparent sigmoid colon wall thickening. Small focal area of wall thickening at the distal transverse colon may reflect a small focus of colitis. 3. Atrophic kidneys bilaterally. 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 Stones are present within both kidneys. Critical Value/emergent results were called by telephone at the time of interpretation on 12/05/2017 at 2:38 am to Dr. Stark Jock, who verbally acknowledged these results. Electronically Signed   By: Donavan Foil M.D.   On: 12/05/2017 02:38   Dg Chest Port 1 View  Result Date: 11/26/2017 CLINICAL DATA:  Tachycardia EXAM: PORTABLE CHEST 1 VIEW COMPARISON:  11/07/2016 FINDINGS: Post sternotomy changes. Enlarged cardiomediastinal silhouette with enlarged central pulmonary arteries. No acute consolidation or effusion. Aortic atherosclerosis. No pneumothorax. IMPRESSION: 1. Mild cardiomegaly.  No edema or infiltrate 2. Prominent central pulmonary arteries, possible pulmonary hypertension. Electronically Signed   By: Donavan Foil M.D.   On: 12/14/2017 23:47    Lab Results: Basic Metabolic Panel: No results for input(s): NA, K, CL, CO2, GLUCOSE, BUN, CREATININE, CALCIUM, MG, PHOS in the last 72 hours. Liver Function Tests: No results for input(s): AST, ALT, ALKPHOS, BILITOT, PROT, ALBUMIN in the last 72 hours.   CBC: No results for input(s): WBC, NEUTROABS, HGB, HCT, MCV, PLT in the last 72 hours.  No results found for this or any previous visit (from the past 240 hour(s)).   Hospital Course: This is a 72 year old with known Barrett's esophagus coronary artery occlusive disease previous myocardial infarction paroxysmal atrial fib, chronic CHF, stage III chronic kidney disease, chronic hypoxic respiratory failure, COPD, diverticulosis, reflux, and mental retardation who came to the emergency department from a skilled care facility with complaints of abdominal pain.  She was found to have pneumoperitoneum presumably from small bowel perforation versus diverticular perforation.  She was septic hypotensive and not felt to be a surgical candidate.  Her brother who made medical decisions for her requested comfort care which was initiated.  She was started on morphine and lorazepam and expired early on the morning of 12/13/2017.  Discharge Exam: Blood pressure (!) 71/52, pulse (!) 131, temperature (!) 97.5 F (36.4 C), temperature source Oral, resp. rate 16, height 5\' 3"  (1.6 m), weight 39.7 kg (87 lb 8.4 oz), SpO2 91 %. Not applicable  Disposition: Released to funeral home      Signed: Markeita Alicia L   12/19/2017, 7:22 AM

## 2018-09-12 ENCOUNTER — Other Ambulatory Visit: Payer: Self-pay | Admitting: *Deleted
